# Patient Record
Sex: Female | Born: 1945 | Race: Black or African American | Hispanic: No | Marital: Single | Smoking: Current some day smoker
Health system: Southern US, Community
[De-identification: ages and names within clinical notes are randomized; demographics above are authoritative.]

## PROBLEM LIST (undated history)

## (undated) DIAGNOSIS — I2510 Atherosclerotic heart disease of native coronary artery without angina pectoris: Secondary | ICD-10-CM

## (undated) DIAGNOSIS — I10 Essential (primary) hypertension: Secondary | ICD-10-CM

## (undated) DIAGNOSIS — M2392 Unspecified internal derangement of left knee: Secondary | ICD-10-CM

## (undated) DIAGNOSIS — M1990 Unspecified osteoarthritis, unspecified site: Secondary | ICD-10-CM

## (undated) DIAGNOSIS — E785 Hyperlipidemia, unspecified: Secondary | ICD-10-CM

## (undated) DIAGNOSIS — J4489 Other specified chronic obstructive pulmonary disease: Secondary | ICD-10-CM

## (undated) DIAGNOSIS — I359 Nonrheumatic aortic valve disorder, unspecified: Secondary | ICD-10-CM

## (undated) DIAGNOSIS — E559 Vitamin D deficiency, unspecified: Secondary | ICD-10-CM

## (undated) DIAGNOSIS — M23309 Other meniscus derangements, unspecified meniscus, unspecified knee: Secondary | ICD-10-CM

## (undated) DIAGNOSIS — J449 Chronic obstructive pulmonary disease, unspecified: Secondary | ICD-10-CM

## (undated) DIAGNOSIS — K219 Gastro-esophageal reflux disease without esophagitis: Secondary | ICD-10-CM

## (undated) DIAGNOSIS — E669 Obesity, unspecified: Secondary | ICD-10-CM

## (undated) DIAGNOSIS — I639 Cerebral infarction, unspecified: Secondary | ICD-10-CM

## (undated) HISTORY — DX: Hyperlipidemia, unspecified: E78.5

## (undated) HISTORY — DX: Other specified chronic obstructive pulmonary disease: J44.89

## (undated) HISTORY — PX: SHOULDER SURGERY: SHX246

## (undated) HISTORY — PX: CORONARY ANGIOPLASTY WITH STENT PLACEMENT: SHX49

## (undated) HISTORY — PX: CHOLECYSTECTOMY: SHX55

## (undated) HISTORY — DX: Other meniscus derangements, unspecified meniscus, unspecified knee: M23.309

## (undated) HISTORY — DX: Obesity, unspecified: E66.9

## (undated) HISTORY — DX: Nonrheumatic aortic valve disorder, unspecified: I35.9

## (undated) HISTORY — DX: Vitamin D deficiency, unspecified: E55.9

## (undated) HISTORY — DX: Unspecified internal derangement of left knee: M23.92

## (undated) HISTORY — PX: KNEE SURGERY: SHX244

## (undated) HISTORY — DX: Atherosclerotic heart disease of native coronary artery without angina pectoris: I25.10

## (undated) HISTORY — DX: Cerebral infarction, unspecified: I63.9

## (undated) HISTORY — PX: ABDOMINAL HYSTERECTOMY: SHX81

## (undated) HISTORY — DX: Chronic obstructive pulmonary disease, unspecified: J44.9

## (undated) HISTORY — DX: Unspecified osteoarthritis, unspecified site: M19.90

## (undated) HISTORY — DX: Essential (primary) hypertension: I10

---

## 1994-03-12 DIAGNOSIS — E1110 Type 2 diabetes mellitus with ketoacidosis without coma: Secondary | ICD-10-CM | POA: Insufficient documentation

## 1997-11-16 ENCOUNTER — Ambulatory Visit: Admission: RE | Admit: 1997-11-16 | Discharge: 1997-11-16 | Payer: Self-pay

## 1997-12-08 ENCOUNTER — Encounter: Admission: RE | Admit: 1997-12-08 | Discharge: 1997-12-08 | Payer: Self-pay | Admitting: Internal Medicine

## 1998-02-01 ENCOUNTER — Ambulatory Visit (HOSPITAL_COMMUNITY): Admission: RE | Admit: 1998-02-01 | Discharge: 1998-02-01 | Payer: Self-pay | Admitting: Urology

## 1998-02-01 ENCOUNTER — Encounter: Payer: Self-pay | Admitting: Urology

## 1998-08-15 ENCOUNTER — Encounter: Payer: Self-pay | Admitting: Cardiology

## 1998-08-15 ENCOUNTER — Ambulatory Visit (HOSPITAL_COMMUNITY): Admission: RE | Admit: 1998-08-15 | Discharge: 1998-08-15 | Payer: Self-pay | Admitting: Cardiology

## 1998-09-11 ENCOUNTER — Encounter: Admission: RE | Admit: 1998-09-11 | Discharge: 1998-09-11 | Payer: Self-pay | Admitting: Internal Medicine

## 1998-12-27 ENCOUNTER — Encounter: Admission: RE | Admit: 1998-12-27 | Discharge: 1998-12-27 | Payer: Self-pay | Admitting: Internal Medicine

## 1999-05-08 ENCOUNTER — Emergency Department (HOSPITAL_COMMUNITY): Admission: EM | Admit: 1999-05-08 | Discharge: 1999-05-08 | Payer: Self-pay | Admitting: Internal Medicine

## 1999-07-15 ENCOUNTER — Emergency Department (HOSPITAL_COMMUNITY): Admission: EM | Admit: 1999-07-15 | Discharge: 1999-07-15 | Payer: Self-pay | Admitting: Emergency Medicine

## 1999-07-30 ENCOUNTER — Emergency Department (HOSPITAL_COMMUNITY): Admission: EM | Admit: 1999-07-30 | Discharge: 1999-07-30 | Payer: Self-pay | Admitting: Emergency Medicine

## 1999-08-07 ENCOUNTER — Encounter: Payer: Self-pay | Admitting: Internal Medicine

## 1999-08-07 ENCOUNTER — Ambulatory Visit (HOSPITAL_COMMUNITY): Admission: RE | Admit: 1999-08-07 | Discharge: 1999-08-07 | Payer: Self-pay | Admitting: Internal Medicine

## 1999-08-07 ENCOUNTER — Encounter: Admission: RE | Admit: 1999-08-07 | Discharge: 1999-08-07 | Payer: Self-pay | Admitting: Internal Medicine

## 2001-03-28 ENCOUNTER — Emergency Department (HOSPITAL_COMMUNITY): Admission: EM | Admit: 2001-03-28 | Discharge: 2001-03-29 | Payer: Self-pay | Admitting: Emergency Medicine

## 2001-07-02 ENCOUNTER — Encounter: Admission: RE | Admit: 2001-07-02 | Discharge: 2001-07-02 | Payer: Self-pay

## 2001-08-02 ENCOUNTER — Ambulatory Visit (HOSPITAL_COMMUNITY): Admission: RE | Admit: 2001-08-02 | Discharge: 2001-08-02 | Payer: Self-pay | Admitting: Internal Medicine

## 2001-08-02 ENCOUNTER — Encounter: Admission: RE | Admit: 2001-08-02 | Discharge: 2001-08-02 | Payer: Self-pay | Admitting: Internal Medicine

## 2001-08-11 ENCOUNTER — Ambulatory Visit (HOSPITAL_COMMUNITY): Admission: RE | Admit: 2001-08-11 | Discharge: 2001-08-11 | Payer: Self-pay | Admitting: Internal Medicine

## 2001-08-18 ENCOUNTER — Encounter: Admission: RE | Admit: 2001-08-18 | Discharge: 2001-08-18 | Payer: Self-pay

## 2001-09-14 ENCOUNTER — Ambulatory Visit (HOSPITAL_COMMUNITY): Admission: RE | Admit: 2001-09-14 | Discharge: 2001-09-14 | Payer: Self-pay | Admitting: Internal Medicine

## 2001-09-14 ENCOUNTER — Encounter: Payer: Self-pay | Admitting: Internal Medicine

## 2001-10-06 ENCOUNTER — Emergency Department (HOSPITAL_COMMUNITY): Admission: EM | Admit: 2001-10-06 | Discharge: 2001-10-06 | Payer: Self-pay | Admitting: Emergency Medicine

## 2001-10-06 ENCOUNTER — Encounter: Payer: Self-pay | Admitting: Emergency Medicine

## 2001-11-29 ENCOUNTER — Encounter: Payer: Self-pay | Admitting: Emergency Medicine

## 2001-11-30 ENCOUNTER — Encounter: Payer: Self-pay | Admitting: Orthopaedic Surgery

## 2001-11-30 ENCOUNTER — Inpatient Hospital Stay (HOSPITAL_COMMUNITY): Admission: EM | Admit: 2001-11-30 | Discharge: 2001-12-03 | Payer: Self-pay | Admitting: Emergency Medicine

## 2001-12-10 ENCOUNTER — Encounter: Admission: RE | Admit: 2001-12-10 | Discharge: 2001-12-10 | Payer: Self-pay | Admitting: Internal Medicine

## 2002-01-27 ENCOUNTER — Encounter: Admission: RE | Admit: 2002-01-27 | Discharge: 2002-02-17 | Payer: Self-pay | Admitting: Orthopaedic Surgery

## 2003-01-10 ENCOUNTER — Encounter: Admission: RE | Admit: 2003-01-10 | Discharge: 2003-01-10 | Payer: Self-pay | Admitting: Internal Medicine

## 2003-06-05 ENCOUNTER — Encounter: Admission: RE | Admit: 2003-06-05 | Discharge: 2003-06-05 | Payer: Self-pay | Admitting: Internal Medicine

## 2003-06-06 ENCOUNTER — Encounter: Admission: RE | Admit: 2003-06-06 | Discharge: 2003-06-06 | Payer: Self-pay | Admitting: Internal Medicine

## 2003-06-13 ENCOUNTER — Encounter: Admission: RE | Admit: 2003-06-13 | Discharge: 2003-06-13 | Payer: Self-pay | Admitting: Internal Medicine

## 2003-06-27 ENCOUNTER — Emergency Department (HOSPITAL_COMMUNITY): Admission: EM | Admit: 2003-06-27 | Discharge: 2003-06-27 | Payer: Self-pay

## 2003-07-01 ENCOUNTER — Emergency Department (HOSPITAL_COMMUNITY): Admission: AD | Admit: 2003-07-01 | Discharge: 2003-07-01 | Payer: Self-pay | Admitting: Family Medicine

## 2003-07-03 ENCOUNTER — Encounter: Admission: RE | Admit: 2003-07-03 | Discharge: 2003-07-03 | Payer: Self-pay | Admitting: Internal Medicine

## 2003-07-10 ENCOUNTER — Encounter: Admission: RE | Admit: 2003-07-10 | Discharge: 2003-07-10 | Payer: Self-pay | Admitting: Internal Medicine

## 2003-07-13 ENCOUNTER — Ambulatory Visit (HOSPITAL_COMMUNITY): Admission: RE | Admit: 2003-07-13 | Discharge: 2003-07-13 | Payer: Self-pay | Admitting: Thoracic Diseases

## 2003-09-23 ENCOUNTER — Emergency Department (HOSPITAL_COMMUNITY): Admission: EM | Admit: 2003-09-23 | Discharge: 2003-09-23 | Payer: Self-pay | Admitting: Emergency Medicine

## 2003-09-24 ENCOUNTER — Ambulatory Visit: Admission: RE | Admit: 2003-09-24 | Discharge: 2003-09-24 | Payer: Self-pay | Admitting: Emergency Medicine

## 2003-09-28 ENCOUNTER — Encounter: Admission: RE | Admit: 2003-09-28 | Discharge: 2003-09-28 | Payer: Self-pay | Admitting: Internal Medicine

## 2003-10-05 ENCOUNTER — Encounter: Admission: RE | Admit: 2003-10-05 | Discharge: 2003-10-05 | Payer: Self-pay | Admitting: Internal Medicine

## 2003-11-27 ENCOUNTER — Encounter: Admission: RE | Admit: 2003-11-27 | Discharge: 2003-11-27 | Payer: Self-pay | Admitting: Internal Medicine

## 2003-11-30 ENCOUNTER — Emergency Department (HOSPITAL_COMMUNITY): Admission: EM | Admit: 2003-11-30 | Discharge: 2003-12-01 | Payer: Self-pay | Admitting: Emergency Medicine

## 2003-12-25 ENCOUNTER — Encounter: Admission: RE | Admit: 2003-12-25 | Discharge: 2003-12-25 | Payer: Self-pay | Admitting: Internal Medicine

## 2004-01-10 ENCOUNTER — Emergency Department (HOSPITAL_COMMUNITY): Admission: EM | Admit: 2004-01-10 | Discharge: 2004-01-10 | Payer: Self-pay | Admitting: Emergency Medicine

## 2004-05-02 ENCOUNTER — Ambulatory Visit: Payer: Self-pay | Admitting: Internal Medicine

## 2004-05-06 ENCOUNTER — Ambulatory Visit: Payer: Self-pay | Admitting: Internal Medicine

## 2004-06-21 ENCOUNTER — Emergency Department (HOSPITAL_COMMUNITY): Admission: EM | Admit: 2004-06-21 | Discharge: 2004-06-21 | Payer: Self-pay | Admitting: Emergency Medicine

## 2004-07-10 ENCOUNTER — Ambulatory Visit: Payer: Self-pay | Admitting: Internal Medicine

## 2004-07-16 ENCOUNTER — Encounter: Payer: Self-pay | Admitting: Internal Medicine

## 2004-07-16 ENCOUNTER — Ambulatory Visit (HOSPITAL_COMMUNITY): Admission: RE | Admit: 2004-07-16 | Discharge: 2004-07-16 | Payer: Self-pay | Admitting: Internal Medicine

## 2004-07-16 ENCOUNTER — Encounter (INDEPENDENT_AMBULATORY_CARE_PROVIDER_SITE_OTHER): Payer: Self-pay | Admitting: *Deleted

## 2004-08-15 ENCOUNTER — Encounter: Admission: RE | Admit: 2004-08-15 | Discharge: 2004-11-13 | Payer: Self-pay | Admitting: Occupational Medicine

## 2004-12-12 ENCOUNTER — Emergency Department (HOSPITAL_COMMUNITY): Admission: EM | Admit: 2004-12-12 | Discharge: 2004-12-12 | Payer: Self-pay | Admitting: *Deleted

## 2005-04-04 ENCOUNTER — Emergency Department (HOSPITAL_COMMUNITY): Admission: EM | Admit: 2005-04-04 | Discharge: 2005-04-04 | Payer: Self-pay | Admitting: Family Medicine

## 2005-04-19 ENCOUNTER — Ambulatory Visit: Payer: Self-pay | Admitting: Internal Medicine

## 2005-04-19 ENCOUNTER — Observation Stay (HOSPITAL_COMMUNITY): Admission: EM | Admit: 2005-04-19 | Discharge: 2005-04-20 | Payer: Self-pay | Admitting: Family Medicine

## 2005-04-24 ENCOUNTER — Ambulatory Visit: Payer: Self-pay | Admitting: Pulmonary Disease

## 2005-04-25 ENCOUNTER — Ambulatory Visit (HOSPITAL_COMMUNITY): Admission: RE | Admit: 2005-04-25 | Discharge: 2005-04-26 | Payer: Self-pay | Admitting: Cardiology

## 2005-04-29 ENCOUNTER — Ambulatory Visit (HOSPITAL_COMMUNITY): Admission: RE | Admit: 2005-04-29 | Discharge: 2005-04-30 | Payer: Self-pay | Admitting: Cardiology

## 2005-04-29 ENCOUNTER — Ambulatory Visit: Payer: Self-pay | Admitting: Cardiology

## 2005-05-13 ENCOUNTER — Ambulatory Visit: Payer: Self-pay | Admitting: *Deleted

## 2005-05-21 ENCOUNTER — Ambulatory Visit: Payer: Self-pay | Admitting: Internal Medicine

## 2005-07-01 ENCOUNTER — Ambulatory Visit: Payer: Self-pay | Admitting: *Deleted

## 2005-07-11 ENCOUNTER — Ambulatory Visit: Payer: Self-pay | Admitting: Internal Medicine

## 2005-09-10 ENCOUNTER — Emergency Department (HOSPITAL_COMMUNITY): Admission: EM | Admit: 2005-09-10 | Discharge: 2005-09-10 | Payer: Self-pay | Admitting: Emergency Medicine

## 2005-09-18 ENCOUNTER — Ambulatory Visit: Payer: Self-pay | Admitting: Internal Medicine

## 2005-10-07 ENCOUNTER — Ambulatory Visit: Payer: Self-pay | Admitting: Internal Medicine

## 2005-10-14 ENCOUNTER — Ambulatory Visit: Payer: Self-pay | Admitting: Internal Medicine

## 2005-12-09 ENCOUNTER — Ambulatory Visit: Payer: Self-pay | Admitting: Internal Medicine

## 2005-12-23 ENCOUNTER — Ambulatory Visit: Payer: Self-pay | Admitting: Hospitalist

## 2006-01-01 ENCOUNTER — Ambulatory Visit: Payer: Self-pay | Admitting: *Deleted

## 2006-01-14 ENCOUNTER — Ambulatory Visit: Payer: Self-pay | Admitting: *Deleted

## 2006-01-14 ENCOUNTER — Observation Stay (HOSPITAL_COMMUNITY): Admission: EM | Admit: 2006-01-14 | Discharge: 2006-01-15 | Payer: Self-pay | Admitting: Emergency Medicine

## 2006-01-19 ENCOUNTER — Ambulatory Visit: Payer: Self-pay

## 2006-03-12 DIAGNOSIS — I2510 Atherosclerotic heart disease of native coronary artery without angina pectoris: Secondary | ICD-10-CM | POA: Insufficient documentation

## 2006-03-12 DIAGNOSIS — I10 Essential (primary) hypertension: Secondary | ICD-10-CM

## 2006-03-12 DIAGNOSIS — E1149 Type 2 diabetes mellitus with other diabetic neurological complication: Secondary | ICD-10-CM

## 2006-03-12 DIAGNOSIS — E1169 Type 2 diabetes mellitus with other specified complication: Secondary | ICD-10-CM

## 2006-03-12 DIAGNOSIS — K219 Gastro-esophageal reflux disease without esophagitis: Secondary | ICD-10-CM | POA: Insufficient documentation

## 2006-03-12 DIAGNOSIS — E785 Hyperlipidemia, unspecified: Secondary | ICD-10-CM

## 2006-05-21 ENCOUNTER — Ambulatory Visit: Payer: Self-pay | Admitting: Internal Medicine

## 2006-05-21 ENCOUNTER — Inpatient Hospital Stay (HOSPITAL_COMMUNITY): Admission: EM | Admit: 2006-05-21 | Discharge: 2006-05-23 | Payer: Self-pay | Admitting: Emergency Medicine

## 2006-06-09 ENCOUNTER — Ambulatory Visit: Payer: Self-pay | Admitting: *Deleted

## 2006-06-15 ENCOUNTER — Ambulatory Visit: Payer: Self-pay | Admitting: Internal Medicine

## 2006-06-15 DIAGNOSIS — D649 Anemia, unspecified: Secondary | ICD-10-CM

## 2006-06-15 DIAGNOSIS — G609 Hereditary and idiopathic neuropathy, unspecified: Secondary | ICD-10-CM | POA: Insufficient documentation

## 2006-06-15 LAB — CONVERTED CEMR LAB: Glucose, Bld: 153 mg/dL

## 2006-06-25 ENCOUNTER — Ambulatory Visit: Payer: Self-pay | Admitting: *Deleted

## 2006-07-03 ENCOUNTER — Ambulatory Visit: Payer: Self-pay | Admitting: *Deleted

## 2006-07-03 LAB — CONVERTED CEMR LAB
AST: 32 units/L (ref 0–37)
Bilirubin, Direct: 0.1 mg/dL (ref 0.0–0.3)
HDL: 47.7 mg/dL (ref >39.0)
Triglycerides: 55 mg/dL (ref 0–149)
VLDL: 11 mg/dL (ref 0–40)

## 2006-09-07 ENCOUNTER — Ambulatory Visit: Payer: Self-pay | Admitting: Internal Medicine

## 2006-10-29 ENCOUNTER — Ambulatory Visit: Payer: Self-pay | Admitting: Internal Medicine

## 2006-10-29 ENCOUNTER — Encounter (INDEPENDENT_AMBULATORY_CARE_PROVIDER_SITE_OTHER): Payer: Self-pay | Admitting: Internal Medicine

## 2006-10-29 DIAGNOSIS — M67919 Unspecified disorder of synovium and tendon, unspecified shoulder: Secondary | ICD-10-CM | POA: Insufficient documentation

## 2006-10-29 DIAGNOSIS — M719 Bursopathy, unspecified: Secondary | ICD-10-CM

## 2006-10-29 LAB — CONVERTED CEMR LAB: Blood Glucose, Fingerstick: 84

## 2006-11-05 ENCOUNTER — Emergency Department (HOSPITAL_COMMUNITY): Admission: EM | Admit: 2006-11-05 | Discharge: 2006-11-05 | Payer: Self-pay | Admitting: Emergency Medicine

## 2006-11-26 ENCOUNTER — Ambulatory Visit (HOSPITAL_COMMUNITY): Admission: RE | Admit: 2006-11-26 | Discharge: 2006-11-26 | Payer: Self-pay | Admitting: Internal Medicine

## 2006-11-26 ENCOUNTER — Ambulatory Visit: Payer: Self-pay | Admitting: *Deleted

## 2006-11-27 ENCOUNTER — Telehealth: Payer: Self-pay | Admitting: *Deleted

## 2006-12-11 ENCOUNTER — Encounter: Admission: RE | Admit: 2006-12-11 | Discharge: 2007-03-11 | Payer: Self-pay | Admitting: Orthopedic Surgery

## 2006-12-25 ENCOUNTER — Ambulatory Visit: Payer: Self-pay | Admitting: Internal Medicine

## 2006-12-29 ENCOUNTER — Telehealth: Payer: Self-pay | Admitting: *Deleted

## 2007-01-06 ENCOUNTER — Encounter (INDEPENDENT_AMBULATORY_CARE_PROVIDER_SITE_OTHER): Payer: Self-pay | Admitting: *Deleted

## 2007-01-29 ENCOUNTER — Telehealth (INDEPENDENT_AMBULATORY_CARE_PROVIDER_SITE_OTHER): Payer: Self-pay | Admitting: *Deleted

## 2007-03-09 ENCOUNTER — Telehealth (INDEPENDENT_AMBULATORY_CARE_PROVIDER_SITE_OTHER): Payer: Self-pay | Admitting: *Deleted

## 2007-03-29 ENCOUNTER — Telehealth: Payer: Self-pay | Admitting: *Deleted

## 2007-06-03 ENCOUNTER — Telehealth: Payer: Self-pay | Admitting: *Deleted

## 2007-06-04 ENCOUNTER — Telehealth: Payer: Self-pay | Admitting: *Deleted

## 2007-06-09 ENCOUNTER — Ambulatory Visit: Payer: Self-pay | Admitting: Cardiology

## 2007-06-10 ENCOUNTER — Telehealth (INDEPENDENT_AMBULATORY_CARE_PROVIDER_SITE_OTHER): Payer: Self-pay | Admitting: *Deleted

## 2007-06-15 ENCOUNTER — Ambulatory Visit: Payer: Self-pay

## 2007-06-25 ENCOUNTER — Telehealth: Payer: Self-pay | Admitting: *Deleted

## 2007-06-28 ENCOUNTER — Ambulatory Visit (HOSPITAL_COMMUNITY): Admission: RE | Admit: 2007-06-28 | Discharge: 2007-06-28 | Payer: Self-pay | Admitting: Orthopedic Surgery

## 2007-06-30 ENCOUNTER — Encounter: Admission: RE | Admit: 2007-06-30 | Discharge: 2007-09-07 | Payer: Self-pay | Admitting: Orthopedic Surgery

## 2007-07-13 ENCOUNTER — Telehealth (INDEPENDENT_AMBULATORY_CARE_PROVIDER_SITE_OTHER): Payer: Self-pay | Admitting: *Deleted

## 2007-08-09 ENCOUNTER — Telehealth: Payer: Self-pay | Admitting: *Deleted

## 2007-09-27 ENCOUNTER — Telehealth (INDEPENDENT_AMBULATORY_CARE_PROVIDER_SITE_OTHER): Payer: Self-pay | Admitting: *Deleted

## 2007-10-18 ENCOUNTER — Emergency Department (HOSPITAL_COMMUNITY): Admission: EM | Admit: 2007-10-18 | Discharge: 2007-10-18 | Payer: Self-pay | Admitting: Family Medicine

## 2007-12-04 ENCOUNTER — Emergency Department (HOSPITAL_COMMUNITY): Admission: EM | Admit: 2007-12-04 | Discharge: 2007-12-04 | Payer: Self-pay | Admitting: Emergency Medicine

## 2007-12-29 ENCOUNTER — Telehealth (INDEPENDENT_AMBULATORY_CARE_PROVIDER_SITE_OTHER): Payer: Self-pay | Admitting: Internal Medicine

## 2008-01-06 ENCOUNTER — Telehealth (INDEPENDENT_AMBULATORY_CARE_PROVIDER_SITE_OTHER): Payer: Self-pay | Admitting: Internal Medicine

## 2008-01-14 ENCOUNTER — Encounter (INDEPENDENT_AMBULATORY_CARE_PROVIDER_SITE_OTHER): Payer: Self-pay | Admitting: *Deleted

## 2008-01-14 ENCOUNTER — Ambulatory Visit: Payer: Self-pay | Admitting: Internal Medicine

## 2008-01-14 LAB — CONVERTED CEMR LAB
BUN: 12 mg/dL (ref 6–23)
CO2: 23 meq/L (ref 19–32)
Calcium: 9.6 mg/dL (ref 8.4–10.5)
Chloride: 111 meq/L (ref 96–112)
Cholesterol: 140 mg/dL (ref 0–200)
Creatinine, Ser: 0.82 mg/dL (ref 0.40–1.20)
HDL: 52 mg/dL (ref >39)
Hgb A1c MFr Bld: 7.1 %
Total CHOL/HDL Ratio: 2.7
Triglycerides: 136 mg/dL (ref <150)

## 2008-03-05 ENCOUNTER — Emergency Department (HOSPITAL_COMMUNITY): Admission: EM | Admit: 2008-03-05 | Discharge: 2008-03-05 | Payer: Self-pay | Admitting: Emergency Medicine

## 2008-09-01 ENCOUNTER — Encounter (INDEPENDENT_AMBULATORY_CARE_PROVIDER_SITE_OTHER): Payer: Self-pay | Admitting: Internal Medicine

## 2008-09-01 ENCOUNTER — Ambulatory Visit: Payer: Self-pay | Admitting: Infectious Disease

## 2008-09-01 LAB — CONVERTED CEMR LAB
BUN: 14 mg/dL (ref 6–23)
CO2: 27 meq/L (ref 19–32)
Calcium: 9.2 mg/dL (ref 8.4–10.5)
Chloride: 110 meq/L (ref 96–112)
Creatinine, Ser: 0.87 mg/dL (ref 0.40–1.20)
GFR calc Af Amer: >60 mL/min (ref >60)
Pro B Natriuretic peptide (BNP): 60 pg/mL (ref 0.0–100.0)

## 2008-09-15 ENCOUNTER — Ambulatory Visit: Payer: Self-pay | Admitting: Internal Medicine

## 2008-09-15 ENCOUNTER — Encounter (INDEPENDENT_AMBULATORY_CARE_PROVIDER_SITE_OTHER): Payer: Self-pay | Admitting: Internal Medicine

## 2008-09-15 LAB — CONVERTED CEMR LAB
Calcium: 9.8 mg/dL (ref 8.4–10.5)
Cholesterol: 197 mg/dL (ref 0–200)
Creatinine, Ser: 0.95 mg/dL (ref 0.40–1.20)
GFR calc Af Amer: >60 mL/min (ref >60)
Glucose, Bld: 194 mg/dL — ABNORMAL HIGH (ref 70–99)
LDL Cholesterol: 99 mg/dL (ref 0–99)
Sodium: 140 meq/L (ref 135–145)
Triglycerides: 257 mg/dL — ABNORMAL HIGH (ref <150)

## 2008-11-06 ENCOUNTER — Emergency Department (HOSPITAL_COMMUNITY): Admission: EM | Admit: 2008-11-06 | Discharge: 2008-11-06 | Payer: Self-pay | Admitting: Emergency Medicine

## 2008-11-20 ENCOUNTER — Encounter: Payer: Self-pay | Admitting: Internal Medicine

## 2008-12-19 ENCOUNTER — Telehealth: Payer: Self-pay | Admitting: Internal Medicine

## 2009-01-08 ENCOUNTER — Ambulatory Visit: Payer: Self-pay | Admitting: Infectious Diseases

## 2009-01-08 DIAGNOSIS — J069 Acute upper respiratory infection, unspecified: Secondary | ICD-10-CM | POA: Insufficient documentation

## 2009-01-08 LAB — CONVERTED CEMR LAB
Cholesterol, target level: 200 mg/dL
HDL goal, serum: 40 mg/dL
LDL Goal: 70 mg/dL

## 2009-01-09 ENCOUNTER — Encounter: Payer: Self-pay | Admitting: Internal Medicine

## 2009-01-09 LAB — CONVERTED CEMR LAB
CO2: 20 meq/L (ref 19–32)
Calcium: 9.5 mg/dL (ref 8.4–10.5)
Creatinine, Ser: 0.85 mg/dL (ref 0.40–1.20)
MCV: 91.2 fL (ref >=78.0)
Platelets: 223 10*3/uL (ref 150–400)
RDW: 13.2 % (ref 11.5–15.5)
Sodium: 143 meq/L (ref 135–145)
WBC: 11.6 10*3/uL — ABNORMAL HIGH (ref 4.0–10.5)

## 2009-01-15 ENCOUNTER — Emergency Department (HOSPITAL_COMMUNITY): Admission: EM | Admit: 2009-01-15 | Discharge: 2009-01-15 | Payer: Self-pay | Admitting: Family Medicine

## 2009-01-16 ENCOUNTER — Telehealth: Payer: Self-pay | Admitting: *Deleted

## 2009-01-19 ENCOUNTER — Emergency Department (HOSPITAL_COMMUNITY): Admission: EM | Admit: 2009-01-19 | Discharge: 2009-01-19 | Payer: Self-pay | Admitting: Emergency Medicine

## 2009-01-22 ENCOUNTER — Ambulatory Visit: Payer: Self-pay | Admitting: Diagnostic Radiology

## 2009-01-22 ENCOUNTER — Ambulatory Visit (HOSPITAL_BASED_OUTPATIENT_CLINIC_OR_DEPARTMENT_OTHER): Admission: RE | Admit: 2009-01-22 | Discharge: 2009-01-22 | Payer: Self-pay | Admitting: Emergency Medicine

## 2009-01-22 ENCOUNTER — Encounter: Payer: Self-pay | Admitting: Internal Medicine

## 2009-01-23 ENCOUNTER — Ambulatory Visit: Payer: Self-pay | Admitting: Internal Medicine

## 2009-01-23 DIAGNOSIS — T887XXA Unspecified adverse effect of drug or medicament, initial encounter: Secondary | ICD-10-CM

## 2009-01-23 DIAGNOSIS — M549 Dorsalgia, unspecified: Secondary | ICD-10-CM | POA: Insufficient documentation

## 2009-01-23 LAB — CONVERTED CEMR LAB: Blood Glucose, Fingerstick: 143

## 2009-01-24 LAB — CONVERTED CEMR LAB
ALT: 9 units/L (ref 0–35)
Albumin: 4.2 g/dL (ref 3.5–5.2)
Creatinine, Urine: 160.5 mg/dL
Indirect Bilirubin: 0.2 mg/dL (ref 0.0–0.9)
Microalb Creat Ratio: 100 mg/g — ABNORMAL HIGH (ref 0.0–30.0)
Microalb, Ur: 16.05 mg/dL — ABNORMAL HIGH (ref 0.00–1.89)
Total CK: 197 units/L — ABNORMAL HIGH (ref 7–177)
Total Protein: 6.7 g/dL (ref 6.0–8.3)

## 2009-01-25 ENCOUNTER — Encounter: Payer: Self-pay | Admitting: Internal Medicine

## 2009-02-13 ENCOUNTER — Encounter: Payer: Self-pay | Admitting: Internal Medicine

## 2009-02-20 ENCOUNTER — Ambulatory Visit: Payer: Self-pay | Admitting: Internal Medicine

## 2009-02-20 DIAGNOSIS — I739 Peripheral vascular disease, unspecified: Secondary | ICD-10-CM

## 2009-02-21 ENCOUNTER — Telehealth (INDEPENDENT_AMBULATORY_CARE_PROVIDER_SITE_OTHER): Payer: Self-pay | Admitting: *Deleted

## 2009-03-07 ENCOUNTER — Telehealth (INDEPENDENT_AMBULATORY_CARE_PROVIDER_SITE_OTHER): Payer: Self-pay | Admitting: *Deleted

## 2009-03-08 ENCOUNTER — Encounter: Payer: Self-pay | Admitting: Internal Medicine

## 2009-03-08 ENCOUNTER — Ambulatory Visit: Payer: Self-pay | Admitting: Cardiovascular Disease

## 2009-03-08 ENCOUNTER — Encounter (HOSPITAL_COMMUNITY): Admission: RE | Admit: 2009-03-08 | Discharge: 2009-04-27 | Payer: Self-pay | Admitting: Internal Medicine

## 2009-03-08 ENCOUNTER — Ambulatory Visit: Payer: Self-pay

## 2009-03-23 ENCOUNTER — Telehealth: Payer: Self-pay | Admitting: Internal Medicine

## 2009-04-10 ENCOUNTER — Telehealth: Payer: Self-pay | Admitting: Internal Medicine

## 2009-05-22 ENCOUNTER — Telehealth: Payer: Self-pay | Admitting: *Deleted

## 2009-05-24 ENCOUNTER — Ambulatory Visit: Payer: Self-pay | Admitting: Internal Medicine

## 2009-05-24 LAB — CONVERTED CEMR LAB
ALT: 12 units/L (ref 0–35)
AST: 11 units/L (ref 0–37)
BUN: 20 mg/dL (ref 6–23)
Blood Glucose, Fingerstick: 187
Creatinine, Ser: 1.01 mg/dL (ref 0.40–1.20)
Hgb A1c MFr Bld: 7.2 %
Total Bilirubin: 0.2 mg/dL — ABNORMAL LOW (ref 0.3–1.2)

## 2009-05-28 ENCOUNTER — Telehealth: Payer: Self-pay | Admitting: Internal Medicine

## 2009-06-25 ENCOUNTER — Emergency Department (HOSPITAL_COMMUNITY): Admission: EM | Admit: 2009-06-25 | Discharge: 2009-06-25 | Payer: Self-pay | Admitting: Family Medicine

## 2009-06-29 ENCOUNTER — Encounter: Payer: Self-pay | Admitting: Internal Medicine

## 2009-07-28 ENCOUNTER — Emergency Department (HOSPITAL_COMMUNITY): Admission: EM | Admit: 2009-07-28 | Discharge: 2009-07-28 | Payer: Self-pay | Admitting: Emergency Medicine

## 2009-07-31 ENCOUNTER — Ambulatory Visit: Payer: Self-pay | Admitting: Internal Medicine

## 2009-07-31 ENCOUNTER — Telehealth: Payer: Self-pay | Admitting: Internal Medicine

## 2009-07-31 DIAGNOSIS — F17200 Nicotine dependence, unspecified, uncomplicated: Secondary | ICD-10-CM | POA: Insufficient documentation

## 2009-08-09 ENCOUNTER — Telehealth: Payer: Self-pay | Admitting: Internal Medicine

## 2009-08-27 ENCOUNTER — Ambulatory Visit: Payer: Self-pay | Admitting: Internal Medicine

## 2009-08-27 LAB — CONVERTED CEMR LAB
Albumin: 4.1 g/dL (ref 3.5–5.2)
Alkaline Phosphatase: 81 units/L (ref 39–117)
BUN: 18 mg/dL (ref 6–23)
CO2: 25 meq/L (ref 19–32)
Glucose, Bld: 156 mg/dL — ABNORMAL HIGH (ref 70–99)
Hemoglobin: 11.8 g/dL — ABNORMAL LOW (ref 12.0–15.0)
MCHC: 32.4 g/dL (ref 30.0–36.0)
MCV: 91.2 fL (ref >=78.0)
Potassium: 4.3 meq/L (ref 3.5–5.3)
RBC: 3.99 M/uL (ref 3.87–5.11)
Sodium: 140 meq/L (ref 135–145)
Total Bilirubin: 0.3 mg/dL (ref 0.3–1.2)
Total Protein: 6.8 g/dL (ref 6.0–8.3)
WBC: 8.3 10*3/uL (ref 4.0–10.5)

## 2009-09-04 ENCOUNTER — Telehealth: Payer: Self-pay | Admitting: Internal Medicine

## 2009-09-26 ENCOUNTER — Ambulatory Visit: Payer: Self-pay | Admitting: Internal Medicine

## 2009-10-09 ENCOUNTER — Telehealth (INDEPENDENT_AMBULATORY_CARE_PROVIDER_SITE_OTHER): Payer: Self-pay | Admitting: *Deleted

## 2009-10-09 ENCOUNTER — Telehealth: Payer: Self-pay | Admitting: Internal Medicine

## 2009-11-07 ENCOUNTER — Telehealth: Payer: Self-pay | Admitting: Internal Medicine

## 2010-02-27 ENCOUNTER — Emergency Department (HOSPITAL_COMMUNITY)
Admission: EM | Admit: 2010-02-27 | Discharge: 2010-02-27 | Payer: Self-pay | Source: Home / Self Care | Admitting: Family Medicine

## 2010-03-19 ENCOUNTER — Ambulatory Visit (HOSPITAL_COMMUNITY)
Admission: RE | Admit: 2010-03-19 | Discharge: 2010-03-19 | Payer: Self-pay | Source: Home / Self Care | Admitting: Chiropractic Medicine

## 2010-05-19 ENCOUNTER — Encounter: Payer: Self-pay | Admitting: *Deleted

## 2010-05-30 NOTE — Progress Notes (Signed)
Summary: phone/gg/investigating affordable insulin options/dmr  Phone Note Call from Patient   Caller: Patient Summary of Call: Pt called and stated she is retiring this Friday and will not be able to use out pt pharmacy for meds.  She has been  on lantus and novolog. Do you have any suggestions of what she could change to and where she could get the best price on these meds? 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     Follow-up by: Barnabas Harries RD,CDE,  October 09, 2009 4:34 PM  Additional Follow-up for Phone Call Additional follow up Details #1::        called patient: she doubts she'll take cobra-and cash price of Lantus nad Novolog( she prefers pens) are high.,  novolog- not going thru much 2-3 units a meal, 10 pens now. lantus- 10 pens left. she already has an application in with [facility], is investigating other avenues. I told her I do not think she would get as good control or be as happy woth human insulin, but if needed she could try N and R or N as basal and Novolog pens for meals as needed. She will make doctor appointment as needed if unable to get current insulin through [facility]. Testing supplies may also become too expensive. She may need to change to a less expensive meter. Additional Follow-up by: Barnabas Harries RD,CDE,  October 09, 2009 4:45 PM

## 2010-05-30 NOTE — Letter (Signed)
Summary: [facility]:   [facility]:   Imported By: Bonner Puna 06/29/2009 16:11:26  _____________________________________________________________________  External Attachment:    Type:   Image     Comment:   External Document

## 2010-05-30 NOTE — Progress Notes (Signed)
Summary: refill/gg  Phone Note Refill Request  on Sep 04, 2009 4:07 PM  Refills Requested: Medication #1:  PRILOSEC 40 MG  CPDR Take 1 tablet by mouth once a day as needed ***Can we change to nexim 20 mg because there is no copay.   Method Requested: Electronic Initial call taken by: Gevena Cotton RN,  Sep 04, 2009 4:07 PM  Follow-up for Phone Call        Rx called to pharmacy Follow-up by: Jolene Provost MD,  Sep 05, 2009 11:09 AM    New/Updated Medications: NEXIUM 20 MG CPDR (ESOMEPRAZOLE MAGNESIUM) Take 1 tablet by mouth once a day Prescriptions: NEXIUM 20 MG CPDR (ESOMEPRAZOLE MAGNESIUM) Take 1 tablet by mouth once a day  #31 x 3   Entered and Authorized by:   Jolene Provost MD   Signed by:   Jolene Provost MD on 09/05/2009   Method used:   Electronically to        [facility]       [address]       [place], [place]  [address]       Ph: [phone]       Fax: [phone]   RxID:   [id number]

## 2010-05-30 NOTE — Progress Notes (Signed)
Summary: phone/gg  Phone Note Call from Patient   Caller: Patient Summary of Call: Pt called for appointment because she feels her medication is making her sick. ( pravastatin )  C/O nausea, headaches, eyes hurt when looks at  bright lights. Initial call taken by: Gevena Cotton RN,  May 22, 2009 10:57 AM  Follow-up for Phone Call        Pt seen on the 27th for this problem Follow-up by: Gevena Cotton RN,  May 29, 2009 9:17 AM

## 2010-05-30 NOTE — Progress Notes (Signed)
Summary: sided effect from medication   Phone Note Call from Patient Call back at Home Phone [phone] Call back at [phone]   Caller: Patient Reason for Call: Talk to Nurse Details for Reason: sided effect from  Pravastatin Sodium 20 Mg Tabs. pt stop taking.  Initial call taken by: Neil Crouch,  May 28, 2009 10:37 AM  Follow-up for Phone Call        Spoke with patient. Pt states Dr. Haroldine Laws order Privastatin 20 mg daily on her last office visit. She had side effects from it N&V headache neck stiffness. She stoped taken this  medication one or two weeks ago. Pt's PCP order Lovastatin 20 mg on 05/24/09. Pt. states she would like to get  a different medication , because she may not be able to take statins. Carollee Sires, RN, BSN  May 28, 2009 11:05 AM   Additional Follow-up for Phone Call Additional follow up Details #1::        lets try red yeat rice 1 tablet a day to start. Jolaine Artist, MD, [facility]  May 28, 2009 11:35 PM  pt is aware, will try Kevan Rosebush, RN  May 29, 2009 11:28 AM

## 2010-05-30 NOTE — Assessment & Plan Note (Signed)
Summary: MEDICATION ADJUSTMENT/gg   Vital Signs:  Patient profile:   65 year old female Height:      65.5 inches (166.37 cm) Weight:      164.7 pounds (74.86 kg) BMI:     27.09 Temp:     98.9 degrees F oral Pulse rate:   79 / minute BP sitting:   149 / 62  (right arm)  Vitals Entered By: Morrison Old RN (May 24, 2009 2:42 PM) CC: Having "problems" w/Pravastatin - nausea and h/a's and dizziness; she stopped taking the med. Is Patient Diabetic? Yes Did you bring your meter with you today? No Pain Assessment Patient in pain? no      Nutritional Status BMI of 25 - 29 = overweight CBG Result 187  Have you ever been in a relationship where you felt threatened, hurt or afraid?No   Does patient need assistance? Functional Status Self care Ambulation Normal   Primary Care Provider:  Jolene Provost MD  CC:  Having "problems" w/Pravastatin - nausea and h/a's and dizziness; she stopped taking the med.Marland Kitchen  History of Present Illness: 65 yo woman with PMH as listed below who presents after calling the clinic saying that she was having side effects from pravastatin.  The patient was on simvastatin that was started 12-2008 but started to have back pain that was felt to be myalgia. Simvastatin was stopped 12-2008. However, she since saw her cardiologist Dr. Haroldine Laws, who felt that her LDL goal for him was <70 and he wanted her to try another statin so he started her on pravastatin on 02-20-2009. At first she was scared to take the pravastatin but then started to take it the first part of December that her cardiologist put her on. She started to get nauseous, feels tired all the time, headaches, dizziness. No muscle These symptoms started after taking it for a few weeks. She kept taking the prvastatin up until the first part of this month. These symptoms have completely resolved.   The patient says that she would be willing to try another statin.  DMII: Did not bring meter today. Checks  blood sugar 3 times/day before meals. Usually less than 150. Never below 40. No symptoms of hypoglycemia. She is going to call to make her yearly eye appointment in February. No ulcers on feet, etc.   Preventive Screening-Counseling & Management  Alcohol-Tobacco     Alcohol drinks/day: 0     Smoking Status: current     Smoking Cessation Counseling: yes     Packs/Day: 1/2     Year Started: 1963  Caffeine-Diet-Exercise     Does Patient Exercise: yes     Type of exercise: WALK  Problems Prior to Update: 1)  Claudication, Intermittent  (ICD-443.9) 2)  Chest Tightness-pressure-other  [id number]) 3)  Claudication  (ICD-443.9) 4)  Adverse Drug Reaction  (ICD-995.20) 5)  Unspecified Backache  (ICD-724.5) 6)  Uri  (ICD-465.9) 7)  Hepatotoxicity, Drug-induced, Risk of  (ICD-V58.69) 8)  Rotator Cuff Syndrome, Right  (ICD-726.10) 9)  Coronary Artery Disease  (ICD-414.00) 10)  Family History Diabetes 1st Degree Relative  (ICD-V18.0) 11)  Peripheral Neuropathy  (ICD-356.9) 12)  Diabetes Mellitus, Type II  (ICD-250.00) 13)  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 Codeine 2)  Simvastatin 3)  * Pravastatin  Review of Systems  The patient  denies anorexia, fever, weight loss, weight gain, vision loss, decreased hearing, hoarseness, chest pain, syncope, dyspnea on exertion, peripheral edema, prolonged cough, headaches, hemoptysis, abdominal pain, melena, hematochezia, severe indigestion/heartburn, hematuria, incontinence, genital sores, muscle weakness, suspicious skin lesions, transient blindness, difficulty walking, depression, unusual weight change, abnormal bleeding, and enlarged lymph nodes.         Please see HPI. Please note that problems of headaches, dizziness, fatigue are not present today and have  resolved.   Physical Exam  General:  alert, well-developed, well-nourished, and well-hydrated.   Head:  normocephalic and atraumatic.   Eyes:  vision grossly intact, pupils equal, pupils round, and pupils reactive to light.   Ears:  no external deformities.   Nose:  no external deformity.   Lungs:  normal respiratory effort, no accessory muscle use, normal breath sounds, no crackles, and no wheezes.   Heart:  normal rate, regular rhythm, no murmur, and no gallop.   Abdomen:  soft, non-tender, and normal bowel sounds.   Neurologic:  alert & oriented X3, cranial nerves II-XII intact, and strength normal in all extremities.   Psych:  Oriented X3, memory intact for recent and remote, normally interactive, good eye contact, not anxious appearing, and not depressed appearing.     Impression & Recommendations:  Problem # 1:  ADVERSE DRUG REACTION (ICD-995.20) From my reading of UpToDate there is a risk of headache, dizziness, and fatigue with Pravastatin. There is no way for me to be sure that this is what is causing her symptoms, but she definitely associates these symptoms with pravastatin and is unwilling to take the medication. Given her Hx of CAD as well as DM, I think she needs to be on a statin if at all possible. I will try Lovastatin and see if she can tolerate this. I discussed this with Dr. Eppie Gibson.  Checking LFTs today.    Orders: T-Comprehensive Metabolic Panel ([id number])  Problem # 2:  DIABETES MELLITUS, TYPE II (ICD-250.00) Did not bring meter but per HPI glucose seems well-controlled. A1c is 7.2 today, which is a very slight increase. I have advised her to continue her current regimen, focus on good diet, and return for checkup in 3 months.  Of note, I changed metformin from the extended release form because she says she can get the non-extended release form for free.  She is going to call to make her yearly eye appointment in February.  The following medications were  removed from the medication list:    Glucophage Xr 500 Mg Xr24h-tab (Metformin hcl) .Marland Kitchen... Take 1 tablet by mouth once a day Her updated medication list for this problem includes:    Lantus Solostar 100 Unit/ml Soln (Insulin glargine) ..... Inject 25  units subcutaneously at bedtime    Humalog Pen 100 Unit/ml Soln (Insulin lispro (human)) ..... [place] as directed for sliding scale coverage.    Lisinopril 5 Mg Tabs (Lisinopril) .Marland Kitchen... Take 1 tablet by mouth once a day    Metformin Hcl 500 Mg Tabs (Metformin hcl) .Marland Kitchen... Please take one tab by mouth twice daily  Orders: T-Hgb A1C (in-house) [id number]) T- Capillary Blood Glucose [id number])  Problem # 3:  HYPERTENSION (ICD-401.9) BP is not at goal today. She had microalb/Cr ratio of 100 in 12/2008. She is not on ACE-I and tells me that she has never had an adverse reaction to an ACE-I in the past. I am starting very low-dose Lisinopril and having her return in 2 weeks to recheck BP and Bmet.  Of  note, I changed metoprolol to regular non-sustained release as she gets the regular form for free. She says she is OK taking it twice daily instead of once daily.   Also, as her ACE is titrated up in the future, if she has BP at goal she may even be able to stop HCTZ as she is on a relatively low dose.   The following medications were removed from the medication list:    Metoprolol Succinate 100 Mg Tb24 (Metoprolol succinate) .Marland Kitchen... Take 1 tablet by mouth once a day Her updated medication list for this problem includes:    Hydrochlorothiazide 12.5 Mg Tabs (Hydrochlorothiazide) .Marland Kitchen... Take 1 tablet by mouth once a day    Lisinopril 5 Mg Tabs (Lisinopril) .Marland Kitchen... Take 1 tablet by mouth once a day    Metoprolol Tartrate 100 Mg Tabs (Metoprolol tartrate) .Marland Kitchen... Please take 1 tab by mouth twice daily  Orders: T-Comprehensive Metabolic Panel ([id number])  Problem # 4:  HYPERLIPIDEMIA (ICD-272.4) See plan above. Started Lovastatin. Is not fasting today so did not perform  fasting lipid panel.  The following medications were removed from the medication list:    Pravastatin Sodium 20 Mg Tabs (Pravastatin sodium) .Marland Kitchen... Take one tablet by mouth daily at bedtime Her updated medication list for this problem includes:    Lovastatin 20 Mg Tabs (Lovastatin) .Marland Kitchen... Take 1 tablet by mouth once a day with dinner  Orders: T-Comprehensive Metabolic Panel ([id number])  Problem # 5:  Preventive Health Care (ICD-V70.0) Gave pneumovax today. Reports having tetanus last year. Says she already had flu this year. Says she had mammo at end of 2010 and that it was normal.   Complete Medication List: 1)  Lantus Solostar 100 Unit/ml Soln (Insulin glargine) .... Inject 25  units subcutaneously at bedtime 2)  Humalog Pen 100 Unit/ml Soln (Insulin lispro (human)) .... [place] as directed for sliding scale coverage. 3)  Plavix 75 Mg Tabs (Clopidogrel bisulfate) .... Take 1 tablet once daily for 1 year (1/08) 4)  Isosorbide Mononitrate Cr 30 Mg Tb24 (Isosorbide mononitrate) .... Take 1 tablet by mouth once a day 5)  Prilosec 40 Mg Cpdr (Omeprazole) .... Take 1 tablet by mouth once a day 6)  One Touch Ultra Test Strips  .... Test blood sugars 4 times a day 7)  Bd Ultra-fine Pen Needles 29g X 12.32mm Misc (Insulin pen needle) .... Use as directed 8)  Hydrochlorothiazide 12.5 Mg Tabs (Hydrochlorothiazide) .... Take 1 tablet by mouth once a day 9)  Onetouch Ultra Test Strp (Glucose blood) .... Use as directed. 10)  Nitrostat 0.4 Mg Subl (Nitroglycerin) .Marland Kitchen.. 1 tablet under tongue at onset of chest pain; you may repeat every 5 minutes for up to 3 doses. 11)  Lovastatin 20 Mg Tabs (Lovastatin) .... Take 1 tablet by mouth once a day with dinner 12)  Lisinopril 5 Mg Tabs (Lisinopril) .... Take 1 tablet by mouth once a day 13)  Metoprolol Tartrate 100 Mg Tabs (Metoprolol tartrate) .... Please take 1 tab by mouth twice daily 14)  Metformin Hcl 500 Mg Tabs (Metformin hcl) .... Please take one tab by  mouth twice daily  Other Orders: Pneumococcal Vaccine [id number]) Admin 1st Vaccine [id number])  Patient Instructions: 1)  Please return to clinic in 2 weeks to recheck your blood pressure and labs after starting the medication Lisinopril. Prescriptions: METFORMIN HCL 500 MG TABS (METFORMIN HCL) Please take one tab by mouth twice daily  #60 x 4   Entered and Authorized by:   Harriett Sine  MD   Signed by:   Harriett Sine MD on 05/24/2009   Method used:   Print then Give to Patient   RxID:   [id number] METOPROLOL TARTRATE 100 MG TABS (METOPROLOL TARTRATE) Please take 1 tab by mouth twice daily  #60 x 4   Entered and Authorized by:   Harriett Sine MD   Signed by:   Harriett Sine MD on 05/24/2009   Method used:   Print then Give to Patient   RxID:   [id number] LOVASTATIN 20 MG TABS (LOVASTATIN) Take 1 tablet by mouth once a day with dinner  #31 x 2   Entered and Authorized by:   Harriett Sine MD   Signed by:   Harriett Sine MD on 05/24/2009   Method used:   Print then Give to Patient   RxID:   [id number] LISINOPRIL 5 MG TABS (LISINOPRIL) Take 1 tablet by mouth once a day  #31 x 2   Entered and Authorized by:   Harriett Sine MD   Signed by:   Harriett Sine MD on 05/24/2009   Method used:   Print then Give to Patient   RxID:   [id number]   Prevention & Chronic Care Immunizations   Influenza vaccine: Not documented   Influenza vaccine deferral: Deferred  (01/23/2009)    Tetanus booster: Not documented   Td booster deferral: Deferred  (01/23/2009)    Pneumococcal vaccine: Pneumovax  (05/24/2009)    H. zoster vaccine: Not documented   H. zoster vaccine deferral: Deferred  (01/23/2009)  Colorectal Screening   Hemoccult: Not documented   Hemoccult action/deferral: Refused  (01/23/2009)    Colonoscopy: Not documented   Colonoscopy action/deferral: Refused  (05/24/2009)  Other Screening   Pap smear: Not documented   Pap smear  action/deferral: Not indicated S/P hysterectomy  (01/08/2009)    Mammogram: Not documented   Mammogram action/deferral: Ordered  (01/08/2009)    DXA bone density scan: Not documented   Smoking status: current  (05/24/2009)   Smoking cessation counseling: yes  (05/24/2009)  Diabetes Mellitus   HgbA1C: 7.2  (05/24/2009)    Eye exam: Not documented   Diabetic eye exam action/deferral: Ophthalmology referral  (01/23/2009)    Foot exam: yes  (01/08/2009)   Foot exam action/deferral: Do today   High risk foot: Not documented   Foot care education: Not documented    Urine microalbumin/creatinine ratio: 100.0  (01/23/2009)   Urine microalbumin action/deferral: Ordered    Diabetes flowsheet reviewed?: Yes   Progress toward A1C goal: Deteriorated  Lipids   Total Cholesterol: 197  (09/15/2008)   LDL: 99  (09/15/2008)   LDL Direct: 111  (09/15/2008)   HDL: 47  (09/15/2008)   Triglycerides: 257  (09/15/2008)    SGOT (AST): 14  (01/23/2009)   SGPT (ALT): 9  (01/23/2009) CMP ordered    Alkaline phosphatase: 86  (01/23/2009)   Total bilirubin: 0.3  (01/23/2009)    Lipid flowsheet reviewed?: Yes   Progress toward LDL goal: Unchanged  Hypertension   Last Blood Pressure: 149 / 62  (05/24/2009)   Serum creatinine: 0.85  (01/09/2009)   Serum potassium 3.9  (01/09/2009) CMP ordered     Hypertension flowsheet reviewed?: Yes   Progress toward BP goal: Unchanged  Self-Management Support :   Personal Goals (by the next clinic visit) :     Personal A1C goal: 7  (01/23/2009)     Personal blood pressure goal: 130/80  (01/08/2009)     Personal LDL goal: 70  (01/08/2009)  Patient will work on the following items until the next clinic visit to reach self-care goals:     Medications and monitoring: take my medicines every day, check my blood sugar, examine my feet every day  (05/24/2009)     Eating: drink diet soda or water instead of juice or soda, eat foods that are low in salt, eat  baked foods instead of fried foods  (05/24/2009)     Activity: take the stairs instead of the elevator  (05/24/2009)    Diabetes self-management support: Written self-care plan  (05/24/2009)   Diabetes care plan printed    Hypertension self-management support: Written self-care plan  (05/24/2009)   Hypertension self-care plan printed.    Lipid self-management support: Written self-care plan  (05/24/2009)   Lipid self-care plan printed.   Nursing Instructions: Give Pneumovax today   Process Orders Check Orders Results:     [facility]: ABN not required for this insurance Tests Sent for requisitioning (May 25, 2009 9:42 AM):     05/24/2009: [facility] -- T-Comprehensive Metabolic Panel [id number] (signed)    Laboratory Results   Blood Tests   Date/Time Received: May 24, 2009 3:01 PM  Date/Time Reported: Earleen Reaper  May 24, 2009 3:01 PM   HGBA1C: 7.2%   (Normal Range: Non-Diabetic - 3-6%   Control Diabetic - 6-8%) CBG Random:: 187mg /dL       Pneumovax Vaccine    Vaccine Type: Pneumovax    Site: left deltoid    Mfr: Merck    Dose: 0.5 ml    Route: IM    Given by: Morrison Old RN    Exp. Date: 08/16/2010    Lot #: [id number]    VIS given: 11/24/95 version given May 24, 2009.

## 2010-05-30 NOTE — Progress Notes (Signed)
Summary: Refill/gh  Phone Note Refill Request Message from:  Fax from Pharmacy on July 31, 2009 1:43 PM  Refills Requested: Medication #1:  LANTUS SOLOSTAR 100 UNIT/ML  SOLN Inject 25  units subcutaneously at bedtime   Last Refilled: 04/10/2009 Last labs and visit 05/24/09   Method Requested: Electronic Initial call taken by: Sander Nephew RN,  July 31, 2009 1:44 PM  Follow-up for Phone Call        Refill approved-nurse to complete Follow-up by: Jolene Provost MD,  July 31, 2009 7:58 PM  Additional Follow-up for Phone Call Additional follow up Details #1::       Additional Follow-up by: Sander Nephew RN,  August 01, 2009 2:57 PM    Prescriptions: LANTUS SOLOSTAR 100 UNIT/ML  SOLN (INSULIN GLARGINE) Inject 25  units subcutaneously at bedtime  #3 months x 6   Entered by:   Jolene Provost MD   Authorized by:   Milta Deiters MD   Signed by:   Jolene Provost MD on 07/31/2009   Method used:   Electronically to        [facility] (retail)       [address]       [place], [place]  [address]       Ph: [phone]       Fax: [phone]   RxID:   [id number]

## 2010-05-30 NOTE — Assessment & Plan Note (Signed)
Summary: CHECKUP/SB.   Vital Signs:  Patient profile:   65 year old female Height:      65.5 inches (166.37 cm) Weight:      157.5 pounds (71.59 kg) BMI:     25.90 Temp:     99.5 degrees F (37.50 degrees C) oral Pulse rate:   68 / minute BP sitting:   126 / 68  (left arm) Cuff size:   regular  Vitals Entered By: Mateo Flow Deborra Medina) (September 26, 2009 1:31 PM) CC: routine f/u Is Patient Diabetic? Yes Did you bring your meter with you today? No Pain Assessment Patient in pain? no      Nutritional Status BMI of 25 - 29 = overweight  Have you ever been in a relationship where you felt threatened, hurt or afraid?No   Does patient need assistance? Functional Status Self care Ambulation Normal   Primary Care Provider:  Jolene Provost MD  CC:  routine f/u.  History of Present Illness: 65 yo woman with PMH of HTN, HLD, DM, GERD, CAD s/p stenting and tobacco abuse who presents to the clinic for a regular follow up. Patient was seen May 2011. Patient only had complaints of muscle spasms where she was given vicodin and flexeril at an urgent care center. She had her electrolytes checked which were all WNL. Patient has no complaints or concerns. She denies having CP, SOB, cough, abdominal pain, N/V, changes in bowel habits, and urinary complaints or concerns. She also states her muscle spasms have completely resolved.         Preventive Screening-Counseling & Management  Alcohol-Tobacco     Alcohol drinks/day: 0     Smoking Status: current     Smoking Cessation Counseling: yes     Packs/Day: 1/2     Year Started: 1963  Current Medications (verified): 1)  Lantus Solostar 100 Unit/ml  Soln (Insulin Glargine) .... Inject 25  Units Subcutaneously At Bedtime 2)  Novolog 100 Unit/ml Soln (Insulin Aspart) .... As Directed 3)  Plavix 75 Mg Tabs (Clopidogrel Bisulfate) .... Take 1 Tablet Once Daily For 1 Year (1/08) 4)  Isosorbide Mononitrate Cr 30 Mg Tb24 (Isosorbide Mononitrate)  .... Take 1 Tablet By Mouth Once A Day 5)  Nexium 20 Mg Cpdr (Esomeprazole Magnesium) .... Take 1 Tablet By Mouth Once A Day 6)  Bd Ultra-Fine Pen Needles 29g X 12.41mm  Misc (Insulin Pen Needle) .... Use As Directed 7)  Hydrochlorothiazide 12.5 Mg Tabs (Hydrochlorothiazide) .... Take 1 Tablet By Mouth Once A Day 8)  Onetouch Ultra Test  Strp (Glucose Blood) .... Use As Directed. 9)  Nitrostat 0.4 Mg Subl (Nitroglycerin) .Marland Kitchen.. 1 Tablet Under Tongue At Onset of Chest Pain; You May Repeat Every 5 Minutes For Up To 3 Doses. 10)  Lisinopril 10 Mg Tabs (Lisinopril) .... Take One Tablet By Mouth Daily 11)  Metoprolol Succinate 100 Mg Xr24h-Tab (Metoprolol Succinate) .... Take One Tablet By Mouth Daily 12)  Metformin Hcl 500 Mg Tabs (Metformin Hcl) .... Please Take One Tab By Mouth Twice Daily 13)  Red Yeast Rice 600 Mg Caps (Red Yeast Rice Extract) .... Once Daily  Allergies: 1)  ! Codeine 2)  ! * Statins  Review of Systems      See HPI  Physical Exam  General:  alert and well-developed.   Head:  normocephalic and atraumatic.   Eyes:  vision grossly intact, pupils equal, pupils round, and pupils reactive to light.   Mouth:  pharynx pink and moist.  Neck:  supple, full ROM, and no masses.   Lungs:  normal respiratory effort and normal breath sounds.   Heart:  normal rate, regular rhythm, no murmur, and no gallop.   Abdomen:  soft, non-tender, and normal bowel sounds.   Msk:  normal ROM.   Pulses:  R radial normal and L radial normal.   Extremities:  no LE edema present Neurologic:  alert & oriented X3.     Impression & Recommendations:  Problem # 1:  HYPERTENSION (ICD-401.9) Assessment Improved Good control, will continue current regimen. Renal function wnl.   Her updated medication list for this problem includes:    Hydrochlorothiazide 12.5 Mg Tabs (Hydrochlorothiazide) .Marland Kitchen... Take 1 tablet by mouth once a day    Lisinopril 10 Mg Tabs (Lisinopril) .Marland Kitchen... Take one tablet by mouth  daily    Metoprolol Succinate 100 Mg Xr24h-tab (Metoprolol succinate) .Marland Kitchen... Take one tablet by mouth daily  BP today: 126/68 Prior BP: 139/73 (08/27/2009)  Prior 10 Yr Risk Heart Disease: N/A (01/08/2009)  Labs Reviewed: K+: 4.3 (08/27/2009) Creat: : 0.92 (08/27/2009)   Chol: 197 (09/15/2008)   HDL: 47 (09/15/2008)   LDL: 99 (09/15/2008)   TG: 257 (09/15/2008)  Problem # 2:  DIABETES MELLITUS, TYPE II (ICD-250.00) No recent hypoglycemic episodes. Good control, will continue current regimen.   Her updated medication list for this problem includes:    Lantus Solostar 100 Unit/ml Soln (Insulin glargine) ..... Inject 25  units subcutaneously at bedtime    Novolog 100 Unit/ml Soln (Insulin aspart) .Marland Kitchen... As directed    Lisinopril 10 Mg Tabs (Lisinopril) .Marland Kitchen... Take one tablet by mouth daily    Metformin Hcl 500 Mg Tabs (Metformin hcl) .Marland Kitchen... Please take one tab by mouth twice daily  Labs Reviewed: Creat: 0.92 (08/27/2009)    Reviewed HgBA1c results: 6.7 (08/27/2009)  7.2 (05/24/2009)  Problem # 3:  HYPERLIPIDEMIA (ICD-272.4) Lipids well controlled. Will check FLP at next appt when patient is fasting.   Labs Reviewed: SGOT: 13 (08/27/2009)   SGPT: 9 (08/27/2009)  Lipid Goals: Chol Goal: 200 (01/08/2009)   HDL Goal: 40 (01/08/2009)   LDL Goal: 70 (01/08/2009)   TG Goal: 150 (01/08/2009)  Prior 10 Yr Risk Heart Disease: N/A (01/08/2009)   HDL:47 (09/15/2008), 52 (01/14/2008)  LDL:99 (09/15/2008), 61 (01/14/2008)  Chol:197 (09/15/2008), 140 (01/14/2008)  Trig:257 (09/15/2008), 136 (01/14/2008)  Problem # 4:  TOBACCO USER (ICD-305.1) Assessment: Comment Only Pt states chantix made her hallucinate and thus she discontinued its use about 2 weeks ago. She reports smoking 1/2 PPD. I provided counseling to patient and suggested nicotine patch as she has never tried them before. Pt agreed, will continue to follow progression.    The following medications were removed from the medication  list:    Chantix Starting Month Pak 0.5 Mg X 11 & 1 Mg X 42 Tabs (Varenicline tartrate) .Marland Kitchen... Take as directed    Chantix Continuing Month Pak 1 Mg Tabs (Varenicline tartrate) .Marland Kitchen... Take as directed  Complete Medication List: 1)  Lantus Solostar 100 Unit/ml Soln (Insulin glargine) .... Inject 25  units subcutaneously at bedtime 2)  Novolog 100 Unit/ml Soln (Insulin aspart) .... As directed 3)  Plavix 75 Mg Tabs (Clopidogrel bisulfate) .... Take 1 tablet once daily for 1 year (1/08) 4)  Isosorbide Mononitrate Cr 30 Mg Tb24 (Isosorbide mononitrate) .... Take 1 tablet by mouth once a day 5)  Nexium 20 Mg Cpdr (Esomeprazole magnesium) .... Take 1 tablet by mouth once a day 6)  Bd Ultra-fine  Pen Needles 29g X 12.1mm Misc (Insulin pen needle) .... Use as directed 7)  Hydrochlorothiazide 12.5 Mg Tabs (Hydrochlorothiazide) .... Take 1 tablet by mouth once a day 8)  Onetouch Ultra Test Strp (Glucose blood) .... Use as directed. 9)  Nitrostat 0.4 Mg Subl (Nitroglycerin) .Marland Kitchen.. 1 tablet under tongue at onset of chest pain; you may repeat every 5 minutes for up to 3 doses. 10)  Lisinopril 10 Mg Tabs (Lisinopril) .... Take one tablet by mouth daily 11)  Metoprolol Succinate 100 Mg Xr24h-tab (Metoprolol succinate) .... Take one tablet by mouth daily 12)  Metformin Hcl 500 Mg Tabs (Metformin hcl) .... Please take one tab by mouth twice daily 13)  Red Yeast Rice 600 Mg Caps (Red yeast rice extract) .... Once daily  Patient Instructions: 1)  Please schedule a follow-up appointment in 6 months. 2)  Tobacco is very bad for your health and your loved ones! You Should stop smoking!. 3)  Stop Smoking Tips: Choose a Quit date. Cut down before the Quit date. decide what you will do as a substitute when you feel the urge to smoke(gum,toothpick,exercise). 4)  It is important that you exercise regularly at least 20 minutes 5 times a week. If you develop chest pain, have severe difficulty breathing, or feel very tired ,  stop exercising immediately and seek medical attention. 5)  Check your blood sugars regularly. If your readings are usually above : 170 or below 70 you should contact our office.    Prevention & Chronic Care Immunizations   Influenza vaccine: Not documented   Influenza vaccine deferral: Deferred  (01/23/2009)    Tetanus booster: Not documented   Td booster deferral: Deferred  (01/23/2009)    Pneumococcal vaccine: Pneumovax  (05/24/2009)    H. zoster vaccine: Not documented   H. zoster vaccine deferral: Deferred  (01/23/2009)  Colorectal Screening   Hemoccult: Not documented   Hemoccult action/deferral: Refused  (01/23/2009)    Colonoscopy: Not documented   Colonoscopy action/deferral: Refused  (05/24/2009)  Other Screening   Pap smear: Not documented   Pap smear action/deferral: Not indicated S/P hysterectomy  (01/08/2009)    Mammogram: Not documented   Mammogram action/deferral: Ordered  (01/08/2009)    DXA bone density scan: Not documented   Smoking status: current  (09/26/2009)   Smoking cessation counseling: yes  (09/26/2009)  Diabetes Mellitus   HgbA1C: 6.7  (08/27/2009)    Eye exam: Not documented   Diabetic eye exam action/deferral: Ophthalmology referral  (01/23/2009)    Foot exam: yes  (01/08/2009)   Foot exam action/deferral: Do today   High risk foot: Not documented   Foot care education: Not documented    Urine microalbumin/creatinine ratio: 100.0  (01/23/2009)   Urine microalbumin action/deferral: Ordered    Diabetes flowsheet reviewed?: Yes   Progress toward A1C goal: Improved  Lipids   Total Cholesterol: 197  (09/15/2008)   LDL: 99  (09/15/2008)   LDL Direct: 111  (09/15/2008)   HDL: 47  (09/15/2008)   Triglycerides: 257  (09/15/2008)    SGOT (AST): 13  (08/27/2009)   SGPT (ALT): 9  (08/27/2009)   Alkaline phosphatase: 81  (08/27/2009)   Total bilirubin: 0.3  (08/27/2009)    Lipid flowsheet reviewed?: Yes   Progress toward LDL goal:  At goal  Hypertension   Last Blood Pressure: 126 / 68  (09/26/2009)   Serum creatinine: 0.92  (08/27/2009)   Serum potassium 4.3  (08/27/2009)    Hypertension flowsheet reviewed?: Yes   Progress  toward BP goal: At goal  Self-Management Support :   Personal Goals (by the next clinic visit) :     Personal A1C goal: 7  (09/26/2009)     Personal blood pressure goal: 130/80  (09/26/2009)     Personal LDL goal: 70  (09/26/2009)    Patient will work on the following items until the next clinic visit to reach self-care goals:     Medications and monitoring: take my medicines every day  (09/26/2009)     Eating: drink diet soda or water instead of juice or soda, eat foods that are low in salt, eat baked foods instead of fried foods  (09/26/2009)     Activity: take a 30 minute walk every day  (09/26/2009)    Diabetes self-management support: Pre-printed educational material, Resources for patients handout, Written self-care plan  (09/26/2009)   Diabetes care plan printed    Hypertension self-management support: Pre-printed educational material, Resources for patients handout, Written self-care plan  (09/26/2009)   Hypertension self-care plan printed.    Lipid self-management support: Pre-printed educational material, Resources for patients handout, Written self-care plan  (09/26/2009)   Lipid self-care plan printed.      Resource handout printed.

## 2010-05-30 NOTE — Assessment & Plan Note (Signed)
Summary: BP/CHECK/(SIDHU)SB.   Vital Signs:  Patient profile:   65 year old female Height:      65.5 inches (166.37 cm) Weight:      158.6 pounds (71.36 kg) BMI:     25.82 Temp:     98.0 degrees F (36.67 degrees C) oral Pulse rate:   79 / minute BP sitting:   139 / 73  (right arm) Cuff size:   regular  Vitals Entered By: Lucky Rathke NT II (Aug 27, 2009 1:56 PM) CC: BP CHECK /  A1C DONE   - CBG=188   Is Patient Diabetic? Yes Did you bring your meter with you today? SYSTEM DOWN Pain Assessment Patient in pain? no      Nutritional Status BMI of > 30 = obese CBG Result 188  Have you ever been in a relationship where you felt threatened, hurt or afraid?No   Does patient need assistance? Functional Status Self care Ambulation Normal Comments BP CHECK   /  A1C DONE    /     Primary Care Provider:  Jolene Provost MD  CC:  BP CHECK /  A1C DONE   - CBG=188  .  History of Present Illness: 65 yo woman with PMH as mentioned in the EMR comes to the office for a follow up on her BP check and to check her HbA1C.  1. Patient reports that her BP was slightly up and Dr. Haroldine Laws increased her Lisinopril to 10 mg. She is here for a follow up on her BP.  2. Patient reports that she has been having muscle spasm that happen occasionally once a week. Patient was seen in the urgent care for this and was prescribed Vicodin and Flexril for this and reports that they help a little bit.   She denies any other problems.    Preventive Screening-Counseling & Management  Alcohol-Tobacco     Alcohol drinks/day: 0     Smoking Status: current     Smoking Cessation Counseling: yes     Packs/Day: 1/2     Year Started: 1963  Caffeine-Diet-Exercise     Does Patient Exercise: yes     Type of exercise: WALK  Problems Prior to Update: 1)  Tobacco User  (ICD-305.1) 2)  Claudication, Intermittent  (ICD-443.9) 3)  Chest Tightness-pressure-other  [id number]) 4)  Claudication  (ICD-443.9) 5)   Adverse Drug Reaction  (ICD-995.20) 6)  Unspecified Backache  (ICD-724.5) 7)  Uri  (ICD-465.9) 8)  Hepatotoxicity, Drug-induced, Risk of  (ICD-V58.69) 9)  Rotator Cuff Syndrome, Right  (ICD-726.10) 10)  Coronary Artery Disease  (ICD-414.00) 11)  Family History Diabetes 1st Degree Relative  (ICD-V18.0) 12)  Peripheral Neuropathy  (ICD-356.9) 13)  Diabetes Mellitus, Type II  (ICD-250.00) 14)  Anemia-nos  (ICD-285.9) 15)  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 Inject 25  Units Subcutaneously At Bedtime 2)  Novolog 100 Unit/ml Soln (Insulin Aspart) .... As Directed 3)  Plavix 75 Mg Tabs (Clopidogrel Bisulfate) .... Take 1 Tablet Once Daily For 1 Year (1/08) 4)  Isosorbide Mononitrate Cr 30 Mg Tb24 (Isosorbide Mononitrate) .... Take 1 Tablet By Mouth Once A Day 5)  Prilosec 40 Mg  Cpdr (Omeprazole) .... Take 1 Tablet By Mouth Once A Day As Needed 6)  Bd Ultra-Fine Pen Needles  29g X 12.7mm  Misc (Insulin Pen Needle) .... Use As Directed 7)  Hydrochlorothiazide 12.5 Mg Tabs (Hydrochlorothiazide) .... Take 1 Tablet By Mouth Once A Day 8)  Onetouch Ultra Test  Strp (Glucose Blood) .... Use As Directed. 9)  Nitrostat 0.4 Mg Subl (Nitroglycerin) .Marland Kitchen.. 1 Tablet Under Tongue At Onset of Chest Pain; You May Repeat Every 5 Minutes For Up To 3 Doses. 10)  Lisinopril 10 Mg Tabs (Lisinopril) .... Take One Tablet By Mouth Daily 11)  Metoprolol Succinate 100 Mg Xr24h-Tab (Metoprolol Succinate) .... Take One Tablet By Mouth Daily 12)  Metformin Hcl 500 Mg Tabs (Metformin Hcl) .... Please Take One Tab By Mouth Twice Daily 13)  Red Yeast Rice 600 Mg Caps (Red Yeast Rice Extract) .... Once Daily 14)  Chantix Starting Month Pak 0.5 Mg X 11 & 1 Mg X 42 Tabs (Varenicline Tartrate) .... Take  As Directed 15)  Chantix Continuing Month Pak 1 Mg Tabs (Varenicline Tartrate) .... Take As Directed  Current Medications (verified): 1)  Lantus Solostar 100 Unit/ml  Soln (Insulin Glargine) .... Inject 25  Units Subcutaneously At Bedtime 2)  Novolog 100 Unit/ml Soln (Insulin Aspart) .... As Directed 3)  Plavix 75 Mg Tabs (Clopidogrel Bisulfate) .... Take 1 Tablet Once Daily For 1 Year (1/08) 4)  Isosorbide Mononitrate Cr 30 Mg Tb24 (Isosorbide Mononitrate) .... Take 1 Tablet By Mouth Once A Day 5)  Prilosec 40 Mg  Cpdr (Omeprazole) .... Take 1 Tablet By Mouth Once A Day As Needed 6)  Bd Ultra-Fine Pen Needles 29g X 12.49mm  Misc (Insulin Pen Needle) .... Use As Directed 7)  Hydrochlorothiazide 12.5 Mg Tabs (Hydrochlorothiazide) .... Take 1 Tablet By Mouth Once A Day 8)  Onetouch Ultra Test  Strp (Glucose Blood) .... Use As Directed. 9)  Nitrostat 0.4 Mg Subl (Nitroglycerin) .Marland Kitchen.. 1 Tablet Under Tongue At Onset of Chest Pain; You May Repeat Every 5 Minutes For Up To 3 Doses. 10)  Lisinopril 10 Mg Tabs (Lisinopril) .... Take One Tablet By Mouth Daily 11)  Metoprolol Succinate 100 Mg Xr24h-Tab (Metoprolol Succinate) .... Take One Tablet By Mouth Daily 12)  Metformin Hcl 500 Mg Tabs (Metformin Hcl) .... Please Take One Tab By Mouth Twice Daily 13)  Red Yeast Rice 600 Mg Caps (Red Yeast Rice Extract) .... Once Daily 14)  Chantix Starting Month Pak 0.5 Mg X 11 & 1 Mg X 42 Tabs (Varenicline Tartrate) .... Take As Directed 15)  Chantix Continuing Month Pak 1 Mg Tabs (Varenicline Tartrate) .... Take As Directed  Allergies (verified): 1)  ! Codeine 2)  ! * Statins  Past History:  Family History: Last updated: 05/09/2008 Family History Diabetes 1st degree relative - mother Family History Hypertension - father She has no siblings with heart disease.  Neither parent has heart disease.    Social History: Last updated: 01/08/2009 Lives alone in [place].  Works for [employer]  center Current Smoker (5 cigs a day) Alcohol use-no Drug use-no Regular exercise-no Tries to follow ADA diet.  Risk Factors: Alcohol Use: 0 (08/27/2009) Exercise: yes (08/27/2009)  Risk Factors: Smoking Status: current (08/27/2009) Packs/Day: 1/2 (08/27/2009)  Past Medical History: Reviewed history from 07/31/2009 and no changes required. Coronary artery disease, s/p stent RCA 04-2005, and recath 05-21-06 with patent stent, but progression of disease to distal RCA     --Myoview 11/10: EF 70% normal perfusion Leg pain    --ABI 11/10: 0.97/1.01 Diabetes Mellitus Type II, previously uncontrolled Peripheral neuropathy DIabetic gastroparesis Hypertension, previously uncontrolled  Hyperlipidemia GERD Comminuted, displaced inferior pole, left patellar  fracture. Right shoulder frozen shoulder and impingement.   w/  Partial-thickness rotator cuff tear.   Past Surgical History: Reviewed history from 05/09/2008 and no changes required. Cholecystectomy Caths  Breast surgery, which was benign, details unknown.  Partial hysterectomy. Arm surgery for tendon repairs following an injury.  Left knee inferior pole patellectomy with patellar tendon  repair.  Right shoulder arthroscopy with extensive intra-   articular debridement including arthroscopic capsular release and   arthroscopic subacromial decompression and debridement of partial-   thickness rotator cuff tear.   Family History: Reviewed history from 05/09/2008 and no changes required. Family History Diabetes 1st degree relative - mother Family History Hypertension - father She has no siblings with heart disease.  Neither parent has heart disease.    Social History: Reviewed history from 01/08/2009 and no changes required. Lives alone in [place].  Works for [employer] center Current Smoker (5 cigs a day) Alcohol use-no Drug use-no Regular exercise-no Tries to follow ADA diet.  Review of Systems      See  HPI  Physical Exam  General:  alert, well-developed, well-nourished, and well-hydrated.   Head:  normocephalic and atraumatic.   Mouth:  good dentition, pharynx pink and moist, no erythema, no exudates, and no posterior lymphoid hypertrophy.   Lungs:  normal respiratory effort, no accessory muscle use, normal breath sounds, no crackles, and no wheezes.   Heart:  normal rate, regular rhythm, no murmur, and no gallop.   Abdomen:  soft, non-tender, and normal bowel sounds.   Pulses:  R radial normal.   Extremities:  No clubbing, cyanosis, edema, or deformity noted with normal full range of motion of all joints.   Neurologic:  alert & oriented X3 and strength normal in all extremities.     Impression & Recommendations:  Problem # 1:  TOBACCO USER (ICD-305.1)  Councelled her regarding cesation. Reports chantix is helping a little bit. Encouraged her to cut down slowly.  Her updated medication list for this problem includes:    Chantix Starting Month Pak 0.5 Mg X 11 & 1 Mg X 42 Tabs (Varenicline tartrate) .Marland Kitchen... Take as directed    Chantix Continuing Month Pak 1 Mg Tabs (Varenicline tartrate) .Marland Kitchen... Take as directed  Orders: T-Comprehensive Metabolic Panel ([id number]) T-CBC No Diff [id number])  Problem # 2:  DIABETES MELLITUS, TYPE II (ICD-250.00) Excellent control. Continue current regimen.  Her updated medication list for this problem includes:    Lantus Solostar 100 Unit/ml Soln (Insulin glargine) ..... Inject 25  units subcutaneously at bedtime    Novolog 100 Unit/ml Soln (Insulin aspart) .Marland Kitchen... As directed    Lisinopril 10 Mg Tabs (Lisinopril) .Marland Kitchen... Take one tablet by mouth daily    Metformin Hcl 500 Mg Tabs (Metformin hcl) .Marland Kitchen... Please take one tab by mouth twice daily  Orders: T- Capillary Blood Glucose [id number]) T-Hgb A1C (in-house) [id number]) T-Comprehensive Metabolic Panel ([id number]) T-CBC No Diff [id number])  Labs Reviewed: Creat: 1.01 (05/24/2009)    Reviewed  HgBA1c results: 6.7 (08/27/2009)  7.2 (05/24/2009)  Problem # 3:  HYPERTENSION (ICD-401.9)  Well controlled on current regimen. Cont current meds.  Her updated medication list for this problem includes:    Hydrochlorothiazide 12.5 Mg Tabs (Hydrochlorothiazide) .Marland Kitchen... Take 1 tablet by mouth once a day    Lisinopril 10 Mg Tabs (Lisinopril) .Marland Kitchen... Take one tablet by mouth daily    Metoprolol Succinate 100 Mg Xr24h-tab (Metoprolol succinate) .Marland Kitchen... Take one tablet by mouth daily  BP today: 139/73 Prior BP: 160/72 (07/31/2009)  Prior 10 Yr Risk Heart Disease: N/A (01/08/2009)  Labs Reviewed: K+: 4.4 (05/24/2009) Creat: : 1.01 (05/24/2009)   Chol: 197 (09/15/2008)   HDL: 47 (09/15/2008)   LDL: 99 (09/15/2008)   TG: 257 (09/15/2008)  Orders: T-Comprehensive Metabolic Panel ([id number]) T-CBC No Diff [id number])  Problem # 4:  UNSPECIFIED BACKACHE (ICD-724.5)  Patient reports muscle spasms occasionally, once every week.Not related to an joint pathology. Patient was prescribed Vicodin and flexril by urgent care. Will check CBC, CMP to rul eout electrolyte abnormalities and to check BUN/C.  Orders: T-Comprehensive Metabolic Panel ([id number]) T-CBC No Diff [id number])  Complete Medication List: 1)  Lantus Solostar 100 Unit/ml Soln (Insulin glargine) .... Inject 25  units subcutaneously at bedtime 2)  Novolog 100 Unit/ml Soln (Insulin aspart) .... As directed 3)  Plavix 75 Mg Tabs (Clopidogrel bisulfate) .... Take 1 tablet once daily for 1 year (1/08) 4)  Isosorbide Mononitrate Cr 30 Mg Tb24 (Isosorbide mononitrate) .... Take 1 tablet by mouth once a day 5)  Prilosec 40 Mg Cpdr (Omeprazole) .... Take 1 tablet by mouth once a day as needed 6)  Bd Ultra-fine Pen Needles 29g X 12.15mm Misc (Insulin pen needle) .... Use as directed 7)  Hydrochlorothiazide 12.5 Mg Tabs (Hydrochlorothiazide) .... Take 1 tablet by mouth once a day 8)  Onetouch Ultra Test Strp (Glucose blood) .... Use as  directed. 9)  Nitrostat 0.4 Mg Subl (Nitroglycerin) .Marland Kitchen.. 1 tablet under tongue at onset of chest pain; you may repeat every 5 minutes for up to 3 doses. 10)  Lisinopril 10 Mg Tabs (Lisinopril) .... Take one tablet by mouth daily 11)  Metoprolol Succinate 100 Mg Xr24h-tab (Metoprolol succinate) .... Take one tablet by mouth daily 12)  Metformin Hcl 500 Mg Tabs (Metformin hcl) .... Please take one tab by mouth twice daily 13)  Red Yeast Rice 600 Mg Caps (Red yeast rice extract) .... Once daily 14)  Chantix Starting Month Pak 0.5 Mg X 11 & 1 Mg X 42 Tabs (Varenicline tartrate) .... Take as directed 15)  Chantix Continuing Month Pak 1 Mg Tabs (Varenicline tartrate) .... Take as directed  Patient Instructions: 1)  Please schedule a follow-up appointment in 6 months. 2)  Take all the medications as advised below. 3)  Tobacco is very bad for your health and your loved ones! You Should stop smoking!. 4)  Stop Smoking Tips: Choose a Quit date. Cut down before the Quit date. decide what you will do as a substitute when you feel the urge to smoke(gum,toothpick,exercise). Process Orders Check Orders Results:     [facility]: ABN not required for this insurance Tests Sent for requisitioning (Aug 27, 2009 2:45 PM):     08/27/2009: [facility] -- T-Comprehensive Metabolic Panel [id number] (signed)     08/27/2009: [facility] -- T-CBC No Diff [id number] (signed)    Prevention & Chronic Care Immunizations   Influenza vaccine: Not documented   Influenza vaccine deferral: Deferred  (01/23/2009)    Tetanus booster: Not documented   Td booster deferral: Deferred  (01/23/2009)    Pneumococcal vaccine: Pneumovax  (05/24/2009)    H. zoster vaccine: Not documented   H. zoster vaccine deferral: Deferred  (01/23/2009)  Colorectal Screening   Hemoccult: Not documented   Hemoccult action/deferral: Refused  (01/23/2009)    Colonoscopy: Not documented    Colonoscopy action/deferral: Refused  (05/24/2009)  Other Screening   Pap smear: Not documented   Pap smear  action/deferral: Not indicated S/P hysterectomy  (01/08/2009)    Mammogram: Not documented   Mammogram action/deferral: Ordered  (01/08/2009)    DXA bone density scan: Not documented   Smoking status: current  (08/27/2009)   Smoking cessation counseling: yes  (08/27/2009)  Diabetes Mellitus   HgbA1C: 6.7  (08/27/2009)    Eye exam: Not documented   Diabetic eye exam action/deferral: Ophthalmology referral  (01/23/2009)    Foot exam: yes  (01/08/2009)   Foot exam action/deferral: Do today   High risk foot: Not documented   Foot care education: Not documented    Urine microalbumin/creatinine ratio: 100.0  (01/23/2009)   Urine microalbumin action/deferral: Ordered  Lipids   Total Cholesterol: 197  (09/15/2008)   LDL: 99  (09/15/2008)   LDL Direct: 111  (09/15/2008)   HDL: 47  (09/15/2008)   Triglycerides: 257  (09/15/2008)    SGOT (AST): 11  (05/24/2009)   SGPT (ALT): 12  (05/24/2009) CMP ordered    Alkaline phosphatase: 91  (05/24/2009)   Total bilirubin: 0.2  (05/24/2009)  Hypertension   Last Blood Pressure: 139 / 73  (08/27/2009)   Serum creatinine: 1.01  (05/24/2009)   Serum potassium 4.4  (05/24/2009) CMP ordered   Self-Management Support :   Personal Goals (by the next clinic visit) :     Personal A1C goal: 7  (01/23/2009)     Personal blood pressure goal: 130/80  (01/08/2009)     Personal LDL goal: 70  (01/08/2009)    Patient will work on the following items until the next clinic visit to reach self-care goals:     Medications and monitoring: take my medicines every day, check my blood sugar, bring all of my medications to every visit, examine my feet every day  (08/27/2009)     Eating: eat more vegetables, use fresh or frozen vegetables, eat foods that are low in salt, eat baked foods instead of fried foods, eat fruit for snacks and desserts, limit or  avoid alcohol  (08/27/2009)     Activity: take a 30 minute walk every day  (08/27/2009)    Diabetes self-management support: Resources for patients handout  (08/27/2009)    Hypertension self-management support: Resources for patients handout  (08/27/2009)    Lipid self-management support: Resources for patients handout  (08/27/2009)        Resource handout printed.  Laboratory Results   Blood Tests   Date/Time Received: Aug 27, 2009 2:18 PM  Date/Time Reported: Maryan Rued  Aug 27, 2009 2:18 PM   HGBA1C: 6.7%   (Normal Range: Non-Diabetic - 3-6%   Control Diabetic - 6-8%) CBG Random:: 188mg /dL

## 2010-05-30 NOTE — Progress Notes (Signed)
Summary: med refill/gp  Phone Note Refill Request Message from:  Fax from Pharmacy on November 07, 2009 12:27 PM  Refills Requested: Medication #1:  METFORMIN HCL 500 MG TABS Please take one tab by mouth twice daily   Last Refilled: 10/10/2009  Method Requested: Electronic Initial call taken by: Morrison Old RN,  November 07, 2009 12:27 PM  Follow-up for Phone Call        Refill approved-nurse to complete Follow-up by: Jolene Provost MD,  November 07, 2009 3:14 PM  Additional Follow-up for Phone Call Additional follow up Details #1::        Rx called to pharmacy - [facility]acy. Additional Follow-up by: Morrison Old RN,  November 07, 2009 4:06 PM    Prescriptions: METFORMIN HCL 500 MG TABS (METFORMIN HCL) Please take one tab by mouth twice daily  #60 x 6   Entered and Authorized by:   Jolene Provost MD   Signed by:   Jolene Provost MD on 11/07/2009   Method used:   Telephoned to ...       [facility] (retail)       [address]       [place], [place]  [address]       Ph: [phone]       Fax: [phone]   RxID:   [id number]

## 2010-05-30 NOTE — Progress Notes (Signed)
Summary: CALLING REGARDING PLAVIX   Phone Note Call from Patient Call back at [phone]   Caller: Patient Reason for Call: Talk to Nurse Summary of Call: PT [place] Initial call taken by: Delsa Sale,  October 09, 2009 10:08 AM  Follow-up for Phone Call        as of Fri she is retiring and will not be able to afford Plavix.  is there anything else she can take? Follow-up by: Devra Dopp, LPN,  June 14, [id number] AM  Additional Follow-up for Phone Call Additional follow up Details #1::        Discussed with Dr Haroldine Laws he said the patient could stop the Plavix but wanted to know how she was going to afford to continue smoking.  I left patient a message to call the office back.  If she continues to smoke she will ruin her stent per Dr Haroldine Laws.   Janan Halter, RN, BSN  October 09, 2009 5:09 PM    Additional Follow-up for Phone Call Additional follow up Details #2::    returning call, Darnell Level  October 10, 2009 8:38 AM   pt aware Kevan Rosebush, RN  October 11, 2009 3:52 PM

## 2010-05-30 NOTE — Progress Notes (Signed)
Summary: chantix  Medications Added CHANTIX STARTING MONTH PAK 0.5 MG X 11 & 1 MG X 42 TABS (VARENICLINE TARTRATE) take as directed CHANTIX CONTINUING MONTH PAK 1 MG TABS (VARENICLINE TARTRATE) take as directed       Phone Note Other Incoming   Summary of Call: received note from pharmacy stating pt wanted rx for chantix starter and maintence packs, per Dr Haroldine Laws this is ok, rx sent  Initial call taken by: Kevan Rosebush, RN,  August 09, 2009 5:45 PM    New/Updated Medications: CHANTIX STARTING MONTH PAK 0.5 MG X 11 & 1 MG X 42 TABS (VARENICLINE TARTRATE) take as directed CHANTIX CONTINUING MONTH PAK 1 MG TABS (VARENICLINE TARTRATE) take as directed Prescriptions: CHANTIX CONTINUING MONTH PAK 1 MG TABS (VARENICLINE TARTRATE) take as directed  #1 pack x 1   Entered by:   Kevan Rosebush, RN   Authorized by:   Jolaine Artist, MD, [facility]   Signed by:   Kevan Rosebush, RN on 08/09/2009   Method used:   Electronically to        [facility] (retail)       [address]       [place], [place]  [address]       Ph: [phone]       Fax: [phone]   RxIDER:[id number] CHANTIX STARTING MONTH PAK 0.5 MG X 11 & 1 MG X 42 TABS (VARENICLINE TARTRATE) take as directed  #1 pack x 0   Entered by:   Kevan Rosebush, RN   Authorized by:   Jolaine Artist, MD, [facility]   Signed by:   Kevan Rosebush, RN on 08/09/2009   Method used:   Electronically to        [facility] (retail)       [address]       [place], [place]  [address]       Ph: [phone]       Fax: [phone]   RxID:   [id number]

## 2010-05-30 NOTE — Assessment & Plan Note (Signed)
Summary: PER CHECK OUT/SF  Medications Added NOVOLOG 100 UNIT/ML SOLN (INSULIN ASPART) as directed PRILOSEC 40 MG  CPDR (OMEPRAZOLE) Take 1 tablet by mouth once a day as needed LISINOPRIL 10 MG TABS (LISINOPRIL) Take one tablet by mouth daily METOPROLOL SUCCINATE 100 MG XR24H-TAB (METOPROLOL SUCCINATE) Take one tablet by mouth daily RED YEAST RICE 600 MG CAPS (RED YEAST RICE EXTRACT) once daily        Visit Type:  Follow-up Primary Provider:  Jolene Provost MD  CC:  1 time with CP Sat.  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 [name].  She has a history of coronary artery disease status post Taxus drug-eluting stent to the RCA in January 2007.  She also has a history of diabetes, hypertension, hyperlipidemia and ongoing tobacco use.  Most recent cardiac catheterization was January 2008, which showed minimal nonobstructive coronary artery disease with a patent RCA stent; however, there was a 90% stenosis in the midsection of the small PDA.  This was treated medically.   Returns for 6 month f/u.  Had Myoview in 11/10 for atypical CP. EF 70% No ischemia/infarct. ABIs 11/10 0.97/1.01  Overall doing well. Feels tired. Over the weekend was having severe pain in L shoulder and back. Also had CP with depp breathing. Went to urgent care. Gave her flexeril/hydrocodone. Which she couldn't tolerate. Feels like it is getting a little better. No exertional CP.   Continue to smoke 1/2-1ppd. Has leg pain due to peripheral neuropathy. No claudication.   Says BP has been doing pretty well.   Current Medications (verified): 1)  Lantus Solostar 100 Unit/ml  Soln (Insulin Glargine) .... Inject 25  Units Subcutaneously At Bedtime 2)  Novolog 100 Unit/ml Soln (Insulin Aspart) .... As Directed 3)  Plavix 75 Mg Tabs (Clopidogrel Bisulfate) .... Take 1 Tablet Once Daily For 1 Year (1/08) 4)   Isosorbide Mononitrate Cr 30 Mg Tb24 (Isosorbide Mononitrate) .... Take 1 Tablet By Mouth Once A Day 5)  Prilosec 40 Mg  Cpdr (Omeprazole) .... Take 1 Tablet By Mouth Once A Day As Needed 6)  Bd Ultra-Fine Pen Needles 29g X 12.40mm  Misc (Insulin Pen Needle) .... Use As Directed 7)  Hydrochlorothiazide 12.5 Mg Tabs (Hydrochlorothiazide) .... Take 1 Tablet By Mouth Once A Day 8)  Onetouch Ultra Test  Strp (Glucose Blood) .... Use As Directed. 9)  Nitrostat 0.4 Mg Subl (Nitroglycerin) .Marland Kitchen.. 1 Tablet Under Tongue At Onset of Chest Pain; You May Repeat Every 5 Minutes For Up To 3 Doses. 10)  Lisinopril 5 Mg Tabs (Lisinopril) .... Take 1 Tablet By Mouth Once A Day 11)  Metoprolol Succinate 100 Mg Xr24h-Tab (Metoprolol Succinate) .... Take One Tablet By Mouth Daily 12)  Metformin Hcl 500 Mg Tabs (Metformin Hcl) .... Please Take One Tab By Mouth Twice Daily 13)  Red Yeast Rice 600 Mg Caps (Red Yeast Rice Extract) .... Once Daily  Allergies: 1)  ! Codeine 2)  ! * Statins  Past History:  Past Medical History: Coronary artery disease, s/p stent RCA 04-2005, and recath 05-21-06 with patent stent, but progression of disease to distal RCA     --Myoview 11/10: EF 70% normal perfusion Leg pain    --ABI 11/10: 0.97/1.01 Diabetes Mellitus Type II, previously uncontrolled Peripheral neuropathy DIabetic gastroparesis Hypertension, previously uncontrolled Hyperlipidemia GERD Comminuted, displaced inferior pole, left patellar  fracture. Right shoulder frozen shoulder and impingement.  w/  Partial-thickness rotator cuff tear.   Review of Systems       As per HPI and past medical history; otherwise all systems negative..  Vital Signs:  Patient profile:   65 year old female Height:      65.5 inches Weight:      157 pounds BMI:     25.82 Pulse rate:   72 / minute BP sitting:   160 / 72  (left arm) Cuff size:   regular  Vitals Entered By: Mignon Pine, RMA (July 31, 2009 2:44 PM)  Physical  Exam  General:  Gen: well appearing. no resp difficulty HEENT: normal Neck: supple. no JVD. Carotids 2+ bilat; no bruits.. Cor: PMI nondisplaced. Regular rate & rhythm. No rubs, murmur. +s4 Lungs: clear Abdomen: soft, nontender, nondistended. No hepatosplenomegaly. No bruits or masses. Good bowel sounds. Extremities: no cyanosis, clubbing, rash, edema Neuro: alert & orientedx3, cranial nerves grossly intact. moves all 4 extremities w/o difficulty. affect pleasant    Impression & Recommendations:  Problem # 1:  CORONARY ARTERY DISEASE (ICD-414.00) Stable. No evidence of ischemia. Recent stress test reassuring. . Continue current regimen.  Problem # 2:  HYPERTENSION (ICD-401.9) BP is up. Increase lisinopril to 10 once daily. She will get a BP cuff and keep a BP log. Follow-up with PCP.   Problem # 3:  TOBACCO USER (ICD-305.1) Counseled on need to quit.  Other Orders: EKG w/ Interpretation (93000)  Patient Instructions: 1)  Increase Lisinopril to 10mg  daily 2)  Your physician has requested that you regularly monitor and record your blood pressure readings at home.  Please use the same machine at the same time of day to check your readings and record them to bring to your follow-up visit. 3)  Follow up in  year Prescriptions: LISINOPRIL 10 MG TABS (LISINOPRIL) Take one tablet by mouth daily  #30 x 12   Entered by:   Kevan Rosebush, RN   Authorized by:   Jolaine Artist, MD, [facility]   Signed by:   Kevan Rosebush, RN on 07/31/2009   Method used:   Electronically to        [facility] (retail)       [address] [place], [place]  [address]       Ph: [phone]       Fax: [phone]   RxID:   [id number] NITROSTAT 0.4 MG SUBL (NITROGLYCERIN) 1 tablet under tongue at onset of chest pain; you may repeat every 5 minutes for up to 3 doses.  #25 x 11   Entered by:   Mignon Pine, RMA   Authorized by:   Jolaine Artist, MD, [facility]   Signed by:   Mignon Pine, RMA on 07/31/2009   Method used:   Faxed to ...       [facility] (retail)       [address] [place], [place]  [address]       Ph: [phone]       Fax: [phone]   RxID:   [id number]

## 2010-06-14 ENCOUNTER — Other Ambulatory Visit: Payer: Self-pay | Admitting: *Deleted

## 2010-06-14 MED ORDER — INSULIN GLARGINE 100 UNIT/ML ~~LOC~~ SOLN: 25.0000 [IU] | Freq: Every day | SUBCUTANEOUS | Status: DC

## 2010-06-14 MED ORDER — INSULIN ASPART 100 UNIT/ML ~~LOC~~ SOLN: SUBCUTANEOUS | Status: DC

## 2010-07-16 LAB — GLUCOSE, CAPILLARY: Glucose-Capillary: 188 mg/dL — ABNORMAL HIGH (ref 70–99)

## 2010-08-02 LAB — POCT I-STAT, CHEM 8
BUN: 18 mg/dL (ref 6–23)
Calcium, Ion: 1.29 mmol/L (ref 1.12–1.32)
HCT: 39 % (ref 36.0–46.0)
Hemoglobin: 13.3 g/dL (ref 12.0–15.0)
Sodium: 139 mEq/L (ref 135–145)
TCO2: 23 mmol/L (ref 0–100)

## 2010-08-02 LAB — GLUCOSE, CAPILLARY
Glucose-Capillary: 143 mg/dL — ABNORMAL HIGH (ref 70–99)
Glucose-Capillary: 162 mg/dL — ABNORMAL HIGH (ref 70–99)

## 2010-08-04 LAB — DIFFERENTIAL
Eosinophils Absolute: 0 10*3/uL (ref 0.0–0.7)
Lymphocytes Relative: 11 % — ABNORMAL LOW (ref 12–46)
Lymphs Abs: 1.6 10*3/uL (ref 0.7–4.0)
Neutrophils Relative %: 83 % — ABNORMAL HIGH (ref 43–77)

## 2010-08-04 LAB — GLUCOSE, CAPILLARY: Glucose-Capillary: 146 mg/dL — ABNORMAL HIGH (ref 70–99)

## 2010-08-04 LAB — URINALYSIS, ROUTINE W REFLEX MICROSCOPIC
Bilirubin Urine: NEGATIVE
Ketones, ur: NEGATIVE mg/dL
Nitrite: NEGATIVE
Protein, ur: 100 mg/dL — AB
Urobilinogen, UA: 0.2 mg/dL (ref 0.0–1.0)

## 2010-08-04 LAB — URINE MICROSCOPIC-ADD ON

## 2010-08-04 LAB — CBC
Platelets: 222 10*3/uL (ref 150–400)
WBC: 14.9 10*3/uL — ABNORMAL HIGH (ref 4.0–10.5)

## 2010-08-04 LAB — POCT I-STAT, CHEM 8
BUN: 23 mg/dL (ref 6–23)
Creatinine, Ser: 1.1 mg/dL (ref 0.4–1.2)
Hemoglobin: 13.6 g/dL (ref 12.0–15.0)
Potassium: 3.8 mEq/L (ref 3.5–5.1)
Sodium: 141 mEq/L (ref 135–145)

## 2010-08-04 LAB — URINE CULTURE: Colony Count: NO GROWTH

## 2010-08-06 LAB — GLUCOSE, CAPILLARY: Glucose-Capillary: 168 mg/dL — ABNORMAL HIGH (ref 70–99)

## 2010-08-08 ENCOUNTER — Encounter: Payer: Self-pay | Admitting: Internal Medicine

## 2010-08-08 ENCOUNTER — Ambulatory Visit (INDEPENDENT_AMBULATORY_CARE_PROVIDER_SITE_OTHER): Payer: BC Managed Care – PPO | Admitting: Internal Medicine

## 2010-08-08 VITALS — BP 190/80 | HR 68 | Resp 18 | Ht 66.0 in | Wt 156.0 lb

## 2010-08-08 DIAGNOSIS — F17200 Nicotine dependence, unspecified, uncomplicated: Secondary | ICD-10-CM

## 2010-08-08 DIAGNOSIS — I10 Essential (primary) hypertension: Secondary | ICD-10-CM

## 2010-08-08 DIAGNOSIS — E785 Hyperlipidemia, unspecified: Secondary | ICD-10-CM

## 2010-08-08 DIAGNOSIS — I2510 Atherosclerotic heart disease of native coronary artery without angina pectoris: Secondary | ICD-10-CM

## 2010-08-08 MED ORDER — LISINOPRIL 20 MG PO TABS: 20.0000 mg | ORAL_TABLET | Freq: Every day | ORAL | Status: DC

## 2010-08-08 MED ORDER — RED YEAST RICE 600 MG PO CAPS: ORAL_CAPSULE | ORAL | Status: DC

## 2010-08-08 NOTE — Assessment & Plan Note (Signed)
Counseled on need to quit.

## 2010-08-08 NOTE — Assessment & Plan Note (Signed)
Has not had her lipids checked in 2 years. Unable to tolerate any statins. Will increase red yeast rice to bid. Check lipids with PCP in 2 weeks. Goal LDL < 70, if possible.

## 2010-08-08 NOTE — Assessment & Plan Note (Signed)
No evidence of ischemia. Continue current regimen.   

## 2010-08-08 NOTE — Progress Notes (Signed)
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 Velora Gross.  She has a history of coronary artery disease status post Taxus drug-eluting stent to the RCA in January 2007.  She also has a history of diabetes, hypertension, hyperlipidemia and ongoing tobacco use.  Most recent cardiac catheterization was January 2008, which showed minimal nonobstructive coronary artery disease with a patent RCA stent; however, there was a 90% stenosis in the midsection of the small PDA.  This was treated medically.  Had Myoview in 11/10 for atypical CP. EF 70% No ischemia/infarct. ABIs 11/10 0.97/1.01  Here for f/u. Says she feels really good. Denies CP or dyspnea. Still smoking 1/2 ppd. Tried to quit with Chantix but couldn't tolerate. Still taking Plavix.Unable to tolerate any statins. Takes Red Yeast Rice once a day without problem. Lipids not checked regularly/   ROS: All systems negative except as listed in HPI, PMH and Problem List.  No past medical history on file.  Current Outpatient Prescriptions  Medication Sig Dispense Refill  . clopidogrel (PLAVIX) 75 MG tablet Take 75 mg by mouth daily.        Marland Kitchen esomeprazole (NEXIUM) 20 MG capsule Take 20 mg by mouth daily before breakfast.        . hydrochlorothiazide (,MICROZIDE/HYDRODIURIL,) 12.5 MG capsule Take 12.5 mg by mouth daily.        . insulin aspart (NOVOLOG) 100 UNIT/ML injection As directed  10 mL  3  . insulin glargine (LANTUS SOLOSTAR) 100 UNIT/ML injection Inject 25 Units into the skin at bedtime.  10 mL  12  . isosorbide mononitrate (IMDUR) 30 MG 24 hr tablet Take 30 mg by mouth daily.        Marland Kitchen lisinopril (PRINIVIL,ZESTRIL) 10 MG tablet Take 10 mg by mouth daily.        . metoprolol (LOPRESSOR) 50 MG tablet Take 50 mg by mouth 2 (two) times daily.           PHYSICAL EXAM: Filed Vitals:   08/08/10 1641  BP: 180/80  Pulse: 68  Resp: 18   General:  Well appearing. No  resp difficulty HEENT: normal Neck: supple. JVP flat. Carotids 2+ bilaterally; no bruits. No lymphadenopathy or thryomegaly appreciated. Cor: PMI normal. Regular rate & rhythm. No rubs, gallops or murmurs. Lungs: clear Abdomen: soft, nontender, nondistended. No hepatosplenomegaly. No bruits or masses. Good bowel sounds. Extremities: no cyanosis, clubbing, rash, edema Neuro: alert & orientedx3, cranial nerves grossly intact. Moves all 4 extremities w/o difficulty. Affect pleasant.    ECG:  NSR 68. LAE No ST-T wave abnormalities.     ASSESSMENT & PLAN:

## 2010-08-08 NOTE — Assessment & Plan Note (Signed)
BP elevated in setting of medication non-compliance. Reviewed risk of dialysis in setting of poorly controlled HTN. Resume meds with increase of lisinopril to 20 qd. Suspect she will need another agent like spironolactone or amlodipine. F/u with PCP in 2 weeks.

## 2010-08-08 NOTE — Patient Instructions (Signed)
Increase Lisinopril to 20mg  daily Increase Red Yeast Rice to 2 tabs daily Follow up with your Primary Care MD in 2 weeks for your BP and cholesterol Your physician wants you to follow-up in: 1 year You will receive a reminder letter in the mail two months in advance. If you don't receive a letter, please call our office to schedule the follow-up appointment.

## 2010-08-13 ENCOUNTER — Ambulatory Visit (INDEPENDENT_AMBULATORY_CARE_PROVIDER_SITE_OTHER): Payer: BC Managed Care – PPO | Admitting: Internal Medicine

## 2010-08-13 ENCOUNTER — Encounter: Payer: Self-pay | Admitting: Internal Medicine

## 2010-08-13 DIAGNOSIS — E119 Type 2 diabetes mellitus without complications: Secondary | ICD-10-CM

## 2010-08-13 LAB — LIPID PANEL
LDL Cholesterol: 158 mg/dL — ABNORMAL HIGH (ref 0–99)
Total CHOL/HDL Ratio: 4.4 Ratio
VLDL: 22 mg/dL (ref 0–40)

## 2010-08-13 LAB — COMPREHENSIVE METABOLIC PANEL
ALT: 12 U/L (ref 0–35)
AST: 15 U/L (ref 0–37)
Alkaline Phosphatase: 95 U/L (ref 39–117)
Calcium: 10.1 mg/dL (ref 8.4–10.5)
Chloride: 108 mEq/L (ref 96–112)
Creat: 0.97 mg/dL (ref 0.40–1.20)

## 2010-08-13 LAB — TSH: TSH: 0.519 u[IU]/mL (ref 0.350–4.500)

## 2010-08-13 LAB — POCT GLYCOSYLATED HEMOGLOBIN (HGB A1C): Hemoglobin A1C: 7.2

## 2010-08-13 LAB — GLUCOSE, CAPILLARY: Glucose-Capillary: 114 mg/dL — ABNORMAL HIGH (ref 70–99)

## 2010-08-13 MED ORDER — LISINOPRIL-HYDROCHLOROTHIAZIDE 20-12.5 MG PO TABS: 1.0000 | ORAL_TABLET | Freq: Every day | ORAL | Status: DC

## 2010-08-13 MED ORDER — NICOTINE 7 MG/24HR TD PT24: 1.0000 | MEDICATED_PATCH | TRANSDERMAL | Status: DC

## 2010-08-13 NOTE — Patient Instructions (Addendum)
Smoking Cessation This document explains the best ways for you to quit smoking and new treatments to help. It lists new medicines that can double or triple your chances of quitting and quitting for good. It also considers ways to avoid relapses and concerns you may have about quitting, including weight gain. NICOTINE: A POWERFUL ADDICTION If you have tried to quit smoking, you know how hard it can be. It is hard because nicotine is a very addictive drug. For some people, it can be as addictive as heroin or cocaine. Usually, people make 2 or 3 tries, or more, before finally being able to quit. Each time you try to quit, you can learn about what helps and what hurts. Quitting takes hard work and a lot of effort, but you can quit smoking. QUITTING SMOKING IS ONE OF THE MOST IMPORTANT THINGS YOU WILL EVER DO:  You will live longer, feel better, and live better.   The impact on your body of quitting smoking is felt almost immediately:   Within 20 minutes, blood pressure decreases. Pulse returns to its normal level.   After 8 hours, carbon monoxide levels in the blood return to normal. Oxygen level increases.   After 24 hours, chance of heart attack starts to decrease. Breath, hair, and body stop smelling like smoke.   After 48 hours, damaged nerve endings begin to recover. Sense of taste and smell improve.   After 72 hours, the body is virtually free of nicotine. Bronchial tubes relax and breathing becomes easier.   After 2 to 12 weeks, lungs can hold more air. Exercise becomes easier and circulation improves.   Quitting will lower your chance of having a heart attack, stroke, cancer, or lung disease:   After 1 year, the risk of coronary heart disease is cut in half.   After 5 years, the risk of stroke falls to the same as a nonsmoker.   After 10 years, the risk of lung cancer is cut in half and the risk of other cancers decreases significantly.   After 15 years, the risk of coronary heart  disease drops, usually to the level of a nonsmoker.   If you are pregnant, quitting smoking will improve your chances of having a healthy baby.   The people you live with, especially your children, will be healthier.   You will have extra money to spend on things other than cigarettes.  FIVE KEYS TO QUITTING Studies have shown that these 5 steps will help you quit smoking and quit for good. You have the best chances of quitting if you use them together: 1. Get ready.  2. Get support and encouragement.  3. Learn new skills and behaviors.  4. Get medicine to reduce your nicotine addiction and use it correctly.  5. Be prepared for relapse or difficult situations. Be determined to continue trying to quit, even if you do not succeed at first.  1. GET READY  Set a quit date.   Change your environment.   Get rid of ALL cigarettes, ashtrays, matches, and lighters in your home, car, and place of work.   Do not let people smoke in your home.   Review your past attempts to quit. Think about what worked and what did not.   Once you quit, do not smoke. NOT EVEN A PUFF!  2. GET SUPPORT AND ENCOURAGEMENT Studies have shown that you have a better chance of being successful if you have help. You can get support in many ways.  Tell  your family, friends, and coworkers that you are going to quit and need their support. Ask them not to smoke around you.   Talk to your caregivers (doctor, dentist, nurse, pharmacist, psychologist, and/or smoking counselor).   Get individual, group, or telephone counseling and support. The more counseling you have, the better your chances are of quitting. Programs are available at local hospitals and health centers. Call your local health department for information about programs in your area.   Spiritual beliefs and practices may help some smokers quit.   Quit meters are small computer programs online or downloadable that keep track of quit statistics, such as amount  of "quit-time," cigarettes not smoked, and money saved.   Many smokers find one or more of the many self-help books available useful in helping them quit and stay off tobacco.  3. LEARN NEW SKILLS AND BEHAVIORS  Try to distract yourself from urges to smoke. Talk to someone, go for a walk, or occupy your time with a task.   When you first try to quit, change your routine. Take a different route to work. Drink tea instead of coffee. Eat breakfast in a different place.   Do something to reduce your stress. Take a hot bath, exercise, or read a book.   Plan something enjoyable to do every day. Reward yourself for not smoking.   Explore interactive web-based programs that specialize in helping you quit.  4. GET MEDICINE AND USE IT CORRECTLY Medicines can help you stop smoking and decrease the urge to smoke. Combining medicine with the above behavioral methods and support can quadruple your chances of successfully quitting smoking. The U.[place]. Food and Drug Administration (FDA) has approved 7 medicines to help you quit smoking. These medicines fall into 3 categories.  Nicotine replacement therapy (delivers nicotine to your body without the negative effects and risks of smoking):   Nicotine gum: Available over-the-counter.   Nicotine lozenges: Available over-the-counter.   Nicotine inhaler: Available by prescription.   Nicotine nasal spray: Available by prescription.   Nicotine skin patches (transdermal): Available by prescription and over-the-counter.   Antidepressant medicine (helps people abstain from smoking, but how this works is unknown):   Bupropion sustained-release (SR) tablets: Available by prescription.   Nicotinic receptor partial agonist (simulates the effect of nicotine in your brain):   Varenicline tartrate tablets: Available by prescription.   Ask your caregiver for advice about which medicines to use and how to use them. Carefully read the information on the package.    Everyone who is trying to quit may benefit from using a medicine. If you are pregnant or trying to become pregnant, nursing an infant, you are under age 18, or you smoke fewer than 10 cigarettes per day, talk to your caregiver before taking any nicotine replacement medicines.   You should stop using a nicotine replacement product and call your caregiver if you experience nausea, dizziness, weakness, vomiting, fast or irregular heartbeat, mouth problems with the lozenge or gum, or redness or swelling of the skin around the patch that does not go away.   Do not use any other product containing nicotine while using a nicotine replacement product.   Talk to your caregiver before using these products if you have diabetes, heart disease, asthma, stomach ulcers, you had a recent heart attack, you have high blood pressure that is not controlled with medicine, a history of irregular heartbeat, or you have been prescribed medicine to help you quit smoking.  5. BE PREPARED FOR RELAPSE OR   DIFFICULT SITUATIONS  Most relapses occur within the first 3 months after quitting. Do not be discouraged if you start smoking again. Remember, most people try several times before they finally quit.   You may have symptoms of withdrawal because your body is used to nicotine. You may crave cigarettes, be irritable, feel very hungry, cough often, get headaches, or have difficulty concentrating.   The withdrawal symptoms are only temporary. They are strongest when you first quit, but they will go away within 10 to 14 days.  Here are some difficult situations to watch for:  Alcohol. Avoid drinking alcohol. Drinking lowers your chances of successfully quitting.   Caffeine. Try to reduce the amount of caffeine you consume. It also lowers your chances of successfully quitting.   Other smokers. Being around smoking can make you want to smoke. Avoid smokers.   Weight gain. Many smokers will gain weight when they quit, usually  less than 10 pounds. Eat a healthy diet and stay active. Do not let weight gain distract you from your main goal, quitting smoking. Some medicines that help you quit smoking may also help delay weight gain. You can always lose the weight gained after you quit.   Bad mood or depression. There are a lot of ways to improve your mood other than smoking.  If you are having problems with any of these situations, talk to your caregiver. SPECIAL SITUATIONS OR CONDITIONS Studies suggest that everyone can quit smoking. Your situation or condition can give you a special reason to quit.  Pregnant women/New mothers: By quitting, you protect your baby's health and your own.   Hospitalized patients: By quitting, you reduce health problems and help healing.   Heart attack patients: By quitting, you reduce your risk of a second heart attack.   Lung, head, and neck cancer patients: By quitting, you reduce your chance of a second cancer.   Parents of children and adolescents: By quitting, you protect your children from illnesses caused by secondhand smoke.  QUESTIONS TO THINK ABOUT Think about the following questions before you try to stop smoking. You may want to talk about your answers with your caregiver.  Why do you want to quit?   If you tried to quit in the past, what helped and what did not?   What will be the most difficult situations for you after you quit? How will you plan to handle them?   Who can help you through the tough times? Your family? Friends? Caregiver?   What pleasures do you get from smoking? What ways can you still get pleasure if you quit?  Here are some questions to ask your caregiver:  How can you help me to be successful at quitting?   What medicine do you think would be best for me and how should I take it?   What should I do if I need more help?   What is smoking withdrawal like? How can I get information on withdrawal?  Quitting takes hard work and a lot of effort,  but you can quit smoking. FOR MORE INFORMATION Smokefree.gov (Inrails.tn) provides free, accurate, evidence-based information and professional assistance to help support the immediate and long-term needs of people trying to quit smoking. Document Released: 04/08/2001 Document Re-Released: 10/02/2009 [facility] Patient Information 2011 [place].   Follow up in 2 weeks time.

## 2010-08-13 NOTE — Progress Notes (Signed)
  Subjective:    Patient ID: Stephanie Gross, female    DOB: 01-28-1946, 65 y.o.   MRN: [id number]  HPI  [name] is a 66 year old female with past medical history of coronary artery disease, hypertension, current tobacco user and diabetes.  Patient is here today because she was told by her cardiologist that she is to to get her lipid profile checked and also needs to contact her PCP for blood pressure checkup.  Patient's diabetes was well-controlled with HbA1c of 7.2 today. Patient is due for an eye exam and has an appointment in May. Patient is already on the lisinopril. We did a foot exam today.  Patient's blood pressure is poorly controlled with a reading from the cardiologist office 190/100 and today it is 168/85. Patient is currently on 3 blood pressure medications which are HCTZ, lisinopril and metoprolol.  Patient is still smoking about 1/2 a pack a day. After extensive discussion and counseling, patient agreed to try nicotine patches today for smoking cessation.  Patient's lipid profile has not been checked in recent times. Patient had tried statins in the past which she could not tolerate. Patient is currently on red yeast rice 600mg  caps. I will check lipid profile today and suggest.   Review of Systems  Constitutional: Negative for fever, activity change and appetite change.  HENT: Negative for sore throat.   Respiratory: Negative for cough and shortness of breath.   Cardiovascular: Negative for chest pain and leg swelling.  Gastrointestinal: Negative for nausea, abdominal pain, diarrhea, constipation and abdominal distention.  Genitourinary: Negative for frequency, hematuria and difficulty urinating.  Neurological: Negative for dizziness and headaches.  Psychiatric/Behavioral: Negative for suicidal ideas and behavioral problems.       Objective:   Physical Exam  Constitutional: She is oriented to person, place, and time. She appears well-developed and well-nourished.    HENT:  Head: Normocephalic and atraumatic.  Eyes: Conjunctivae and EOM are normal. Pupils are equal, round, and reactive to light. No scleral icterus.  Neck: Normal range of motion. Neck supple. No JVD present. No thyromegaly present.  Cardiovascular: Normal rate, regular rhythm, normal heart sounds and intact distal pulses.  Exam reveals no gallop and no friction rub.   No murmur heard. Pulmonary/Chest: Effort normal and breath sounds normal. No respiratory distress. She has no wheezes. She has no rales.  Abdominal: Soft. Bowel sounds are normal. She exhibits no distension and no mass. There is no tenderness. There is no rebound and no guarding.  Musculoskeletal: Normal range of motion. She exhibits no edema and no tenderness.  Lymphadenopathy:    She has no cervical adenopathy.  Neurological: She is alert and oriented to person, place, and time.  Psychiatric: She has a normal mood and affect. Her behavior is normal.          Assessment & Plan:

## 2010-09-10 NOTE — Assessment & Plan Note (Signed)
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  [name] and is now presenting to have her initial visit with me.  She  has a history of coronary artery disease status post Taxus drug-eluting  stent to the RCA in January 2007.  She also has a history of diabetes,  hypertension and hyperlipidemia.  Most recent cardiac catheterization  was January 2008, which showed minimal nonobstructive coronary artery  disease with a patent RCA stent; however, there was a 90% stenosis in  the midsection of the small PDA.  This was treated medically.   She returns today.  She is doing quite well.  She denies any chest pain  or shortness of breath.  She does not exercise regularly but is very  active at work.   CURRENT MEDICATIONS:  1. Metformin 500 mg b.i.d.  2. Toprol 100 mg a day.  3. Norvasc 5 mg a day.  4. Neurontin 100 mg b.i.d.  5. Enalapril 20 mg a day.  6. Lantus insulin.  7. Aspirin 325 mg.  8. Plavix 75 mg.  9. Imdur 30 mg.  10.Simvastatin 80 mg.  11.Reglan.   PHYSICAL EXAMINATION:  GENERAL:  She is well-appearing, in no acute  distress.  Ambulates around the clinic without any respiratory  difficulty.  VITAL SIGNS:  Initial blood pressure 124/66, manual recheck was 132/70.  Heart rate 67.  Weight is 151.  HEENT:  Normal.  NECK:  Supple, no JVD.  Carotids are 2+ bilaterally, no bruits.  There  is no lymphadenopathy or thyromegaly.  CARDIAC:  Regular rate and rhythm.  No murmurs, rubs or gallops.  LUNGS:  Clear.  ABDOMEN:  Soft, nontender, nondistended.  There is a well-healed  cholecystectomy scar.  No masses.  No bruits.  Good bowel sounds.  EXTREMITIES:   Warm with no cyanosis, clubbing or edema, no rashes.  Good  pulses.  NEUROLOGIC:  Alert and oriented x3.  Cranial nerves II-XII are intact.  Moves all 4 extremities without difficulty.  Affect is pleasant.   ASSESSMENT AND PLAN:  1. Coronary artery disease is quite stable and without any evidence of      ischemia.  Continue current therapy.  2. Hyperlipidemia.  Most recent LDL is 49.  LFTs are normal.  Continue      current therapy.  Recheck in 6 months.  3. Hypertension, generally well-controlled although some readings      slightly elevated.  She will get a blood pressure cuff at home and      follow this herself.  She will bring me a blood pressure log in 6      months.  Given her coronary disease and diabetes, I will need to      keep her systolic blood pressure under  Cloverdale Bensimhon, MD     DRB/MedQ  DD: 09/07/2006  DT: 09/07/2006  Job #: [id number]

## 2010-09-10 NOTE — Op Note (Signed)
NAME:  Stephanie Gross, Stephanie Gross                 ACCOUNT NO.:  [id number]   MEDICAL RECORD NO.:  [id number]          PATIENT TYPE:  AMB   LOCATION:  SDS                          FACILITY:  [facility]   PHYSICIAN:  Doran Heater. [name], M.D. DATE OF BIRTH:  23-Aug-1945   DATE OF PROCEDURE:  06/28/2007  DATE OF DISCHARGE:  06/28/2007                               OPERATIVE REPORT   PREOPERATIVE DIAGNOSIS:  Right shoulder frozen shoulder and impingement.   POSTOPERATIVE DIAGNOSES:  1. Right shoulder frozen shoulder and impingement.  2. Partial-thickness rotator cuff tear.   PROCEDURE PERFORMED:  Right shoulder arthroscopy with extensive intra-  articular debridement including arthroscopic capsular release and  arthroscopic subacromial decompression and debridement of partial-  thickness rotator cuff tear.   SURGEON:  Doran Heater. [name], M.D.   ASSISTANT:  Abbott Pao. Doren Custard, P.A.   General anesthesia was used plus interscalene block.   ESTIMATED BLOOD LOSS:  Minimal.   FLUID REPLACEMENT:  1500 mL crystalloid.   Instrument counts correct.  No complications.  Perioperative antibiotics  given.   INDICATIONS:  The patient is a 65 year old female with a history of  worsening right shoulder pain and stiffness secondary to frozen  shoulder.  The patient presents now for operative treatment, having  failed conservative management consisting of injection therapy and  activity modifications.  Informed consent was obtained.   DESCRIPTION OF PROCEDURE:  After an adequate level of anesthesia was  achieved, the patient was positioned in the modified beach-chair  position.  Right shoulder examined under anesthesia, forward elevation  limited to 85 degrees, abduction 45 degrees, external rotation 0,  internal rotation about 30 degrees.  After examining of the shoulder, we  went ahead and manipulated the shoulder.  I was able to improve forward  elevation about 20 degrees and had a little bit of a pop with cross-arm  adduction and internal rotation but not much progress in external  rotation, was able to be about 30-40 degrees on that.  Following this,  this was as much as I could manipulate her comfortably.  I then went  ahead and sterilely prepped and draped the right shoulder for  arthroscopy.  We entered the shoulder arthroscopically using standard  portals, anterior, posterior and lateral portals.  We performed a 360-  degree capsular release.  The subscapularis was normal.  The rotator  cuff was __________.  there was a partial-thickness tear involving 20%  of the thickness of the tendon.  We performed a debridement of partial  tear and scuffed up the rotator cuff footprint to encourage healing.  This was limited to the supraspinatus only.  Infraspinatus and teres  minor were normal.  The labrum did have some attritional degenerative  changes around the labrum and also a couple of small areas of  chondromalacia grade II changes present.  Following this we placed the  scope in the subacromial space.  Thorough bursectomy and acromioplasty  was performed, especially paying attention to the anterior lateral  overhang,  and once we had a thorough rotator cuff outlet release, we were able to  inspect the  cuff and make sure that this was intact in the bursal  surface, which it was.  We then concluded surgery, suturing the wounds  with 4-0 Monocryl followed by Steri-Strips and a sterile dressing.  The  patient tolerated the surgery well.      Doran Heater. [name], M.D.  Electronically Signed     SRN/MEDQ  D:  06/28/2007  T:  06/29/2007  Job:  [id number]

## 2010-09-10 NOTE — Assessment & Plan Note (Signed)
[facility]                            CARDIOLOGY OFFICE NOTE   NAME:Stephanie Gross, Stephanie Gross                        MRN:          [id number]  DATE:06/09/2007                            DOB:          [birth year]    PRIMARY CARDIOLOGIST:  Dr. Haroldine Laws.   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  She has a history of coronary artery  disease status post Taxus drug-eluting stent to the RCA in January 2007.  Her last cath was in January 2008 which showed minimal nonobstructive  coronary disease with a patent RCA stent.  However there was a 90%  stenosis in the mid section of the small PDA.  She was treated  medically.  She then had any emergency room visit with chest pain July  2008 in which her enzymes were negative and she thought it was gas  related.  She does have a normal LV function, ejection fraction 65%.   The patient denies any chest pain, palpitations, dyspnea, dyspnea on  exertion, dizziness or presyncope.  She says she does not exercise  outside of walking all day at work.  She needs surgery for a frozen  shoulder and will be undergoing general anesthesia.  The patient has  cardiac risk factors that include insulin dependent diabetes mellitus,  hypertension, hyperlipidemia and she continues to smoke a half-pack of  cigarettes a day   ALLERGIES:  CODEINE.   CURRENT MEDICATIONS:  Metoprolol ER 100 mg daily, Norvasc 5 mg daily,  Plavix 75 mg daily, simvastatin 80 mg daily, metoclopramide 10 mg one-  half t.i.d., enalapril 20 mg daily, aspirin 325 mg daily, Imdur 30 mg  daily, Humalog sliding scale insulin and Lantus 25 units daily.   PAST MEDICAL HISTORY:  Significant for hypertension, diabetes,  hyperlipidemia.  She has been having some back trouble recently.   SOCIAL HISTORY:  She works as a Microbiologist at [employer].  She  smokes half a pack of cigarettes a day and says she is trying to  quit.   REVIEW OF SYSTEMS:  Is negative for dizziness or presyncopal signs or  symptoms, dyspepsia, dysphagia, nausea, vomiting, change in bowels or  melena.  Cardiopulmonary:  Please see HPI.  She is having some back  trouble.   PHYSICAL EXAM:  This is a pleasant 65 year old African American female  in no acute distress.  Blood pressure 132/74, pulse 75, weight 155.  Neck is without JVD, HJR bruit or thyroid enlargement.  LUNGS:  Clear anterior posterior lateral.  HEART:  Regular rate and rhythm at 75 beats per minute.  Normal S1-S2  positive S4 and no significant murmur heard.  Abdomen is soft organomegaly, masses, lesions or abnormal tenderness.  EXTREMITIES:  Without cyanosis or edema.  She has good distal pulses.   EKG normal sinus rhythm.  No acute change.   IMPRESSION:  1. Frozen shoulder, needs surgery, scheduled for February 23 by Dr.      [name].  2. Coronary artery disease status post TAXUS drug-eluting stent to the  RCA in January 2007, cath in January 2008 showed minimal      nonobstructive coronary disease with patent RCA stent, however a      90% stenosis in the mid section of a small PDA, medical treatment      recommended.  3. Normal LV function.  4. Hypertension treated.  5. Hyperlipidemia treated.  6. Insulin dependent diabetes mellitus, well-controlled.  7. Smoker, half pack per day.   PLAN:  At this time the patient has been stable from a cardiac  standpoint.  We will order a stress Cardiolite prior to her surgery and  this is normal,  she should be low risk for undergoing surgery.      Stephanie Barrios, PA-C  Electronically Signed      Stephanie Bjornstad, MD, [facility]  Electronically Signed   ML/MedQ  DD: 06/09/2007  DT: 06/10/2007  Job #: [id number]   cc:   Doran Heater. [name], M.D.  [place]

## 2010-09-13 NOTE — H&P (Signed)
NAMEKRIPA, PULLIUM                 ACCOUNT NO.:  [id number]   MEDICAL RECORD NO.:  [id number]          PATIENT TYPE:  INP   LOCATION:  2027                         FACILITY:  [facility]   PHYSICIAN:  Alexis Goodell. Haithcock, MDDATE OF BIRTH:  08/31/1945   DATE OF ADMISSION:  01/14/2006  DATE OF DISCHARGE:  01/15/2006                                HISTORY & PHYSICAL   CHIEF COMPLAINT:  Chest pain.   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 Coralie Keens of the [facility] who was pushing dietary carts at work when she experienced  6-7/10 chest pain with diaphoresis.  She also experienced palpitations, and  when someone at work checked her blood pressure today, they told her it was  high, although, she does not recall the exact readings.  Her symptoms were  completely relieved by arrival to the emergency room by three sublingual  nitroglycerins administered by [employer].  In the field, she received a  full dose of aspirin.  Currently, she denies chest pain, shortness of  breath.  She has given no history of syncope or near syncope.  She denies  CHF symptoms.  She denies any GERD symptoms and currently her pain is not  reproducible with passive movement of her extremities or torso.  This is the  first episode of chest pain that she has had in several months.   PAST MEDICAL HISTORY:  1. Coronary artery disease catheter April 25, 2005, 80% RCA lesion      treated with TAXUS stent.  2. Hypertension.  3. Type 2 diabetes.  4. Gastroparesis.  5. Hypercholesterolemia.  6. Peripheral neuropathy.  7. Tobacco.  8. Anemia.  9. Left patella fracture.   SOCIAL HISTORY:  Lives in [place].  Occupation:  Counsellor.  A  20-pack year smoking history.  No alcohol or drugs.   ALLERGIES:  CODEINE.   MEDICATIONS:  1. Aspirin.  2. Lipitor 40 mg daily.  3. Enalapril 40 mg daily.  4. Toprol XL 50  mg daily.  5. Lantus 20 mg q.h.s.  6. Neurontin 300 mg b.i.d.  7. Hydrochlorothiazide 12.5 mg daily.  8. Nitroglycerin p.r.n.   REVIEW OF SYSTEMS:  A 13-point review of systems otherwise negative, besides  what is pointed out in History and Physical.   PHYSICAL EXAMINATION:  VITAL SIGNS:  Temperature 98.7, pulse 62,  respirations 18, blood pressure 159/78, pulse 98% on room air.  GENERAL:  She is a very pleasant anxious woman in no apparent distress.  HEENT:  Pupils equal, round and reactive to light.  Extraocular movements  intact.  Posterior oropharynx is clear.  NECK:  Supple.  Jugular venous pulsations are normal with the head of the  bed at 30 degrees.  No lymphadenopathy appreciated.  CARDIOVASCULAR:  Regular S1, S2.  A faint S4 is heard only in the left  lateral decubitus position.  No murmurs are appreciated.  PMI is slightly  laterally deviated.  LUNGS:  Clear to auscultation without adventitial sounds.  SKIN:  No rashes or lesions.  ABDOMEN:  Soft, nontender with positive bowel sounds.  RECTAL:  Brown heme negative stool.  EXTREMITIES:  No cyanosis, clubbing or edema.  MUSCULOSKELETAL:  No deformities.  NEUROLOGICAL:  Alert and oriented x3.  Cranial nerves II-XII grossly intact.  Strength 5/5 in all extremities and all axial groups.  Normal sensation  throughout.  Normal cerebellar function.   STUDIES:  Chest x-ray is pending.  EKG shows normal sinus rhythm at a rate  of 84 beats per minute.  There is nonspecific ST segment changes, likely  associated with repolarization from left ventricular hypertrophy.  Point of  care enzymes were negative.  Labs were otherwise unremarkable.   ASSESSMENT/PLAN:  A 65 year old woman with a history of coronary artery  disease, now presents with chest pain.  Will admit the patient to telemetry  and psychocardiac enzymes x3.  Initial aspirin, beta blocker, ACE inhibitor,  low molecular weight heparin, as well as Statin therapy.  Given her  symptom  onset and her history, will likely consider a nuclear stress test for  further evaluation.      Daniel B. Haithcock, MD  Electronically Signed     DBH/MEDQ  D:  01/14/2006  T:  01/16/2006  Job:  [id number]

## 2010-09-13 NOTE — Assessment & Plan Note (Signed)
[facility]                            CARDIOLOGY OFFICE NOTE   NAME:Gross, [name]                        MRN:          [id number]  DATE:06/25/2006                            DOB:          01-26-1946    CARDIOLOGIST:  Dr. Coralie Keens.   HISTORY OF PRESENT ILLNESS:  Stephanie Gross is a 65 year old female patient  followed by Dr. [name] with a history of coronary disease status post  TAXUS drug-eluting stenting to the RCA in January, 2007, who recently  underwent cardiac catheterization May 22, 2006, that revealed a  patent stent to the RCA, a 40% proximal LAD stenosis, a 20% mid  circumflex stenosis and diffusely diseased posterior descending artery  with up to 90% stenosis in the mid section.  The PDA was a small vessel  and it was decided to treat the patient medically.  She recently saw Dr.  [name] back.  She was noted to have elevated blood pressure and he  placed her on Norvasc 5 mg a day.  She returns today for blood pressure  check.  She actually did not take any of her medications this morning  and she is fasting for blood work.  She says that she recently saw one  of her other physicians and was told that her blood pressure was better  than it has ever been.  She is unsure of what the number was.  She  denies any chest discomfort or shortness of breath.  Denies any syncope.   CURRENT MEDICATIONS:  1. Metformin 500 mg twice daily.  2. Metoprolol ER 100 mg a day.  3. Norvasc 5 mg daily.  4. Enalapril 20 mg daily.  5. Lantus insulin.  6. Aspirin 325 mg daily.  7. Plavix 75 mg daily.  8. Humalog sliding scale.  9. Imdur 30 mg a day.  10.Simvastatin 80 mg daily.  11.Metoclopramide 10 mg 1/2 tablet three times a day.  12.Nitroglycerin p.r.n. chest pain.   ALLERGIES:  CODEINE.   PHYSICAL EXAM:  She is a well-nourished, well-developed female in no  acute distress.  Blood pressure is 154/78.  Repeat blood pressure by me  by manual cuff  148/76 on the right, 144/78 on the left, pulse 60, weight  153 pounds.  HEENT:  Unremarkable.  NECK:  No JVD.  CARDIAC:  Normal S1, S2.  Regular rate and rhythm.  LUNGS:  Clear to auscultation bilaterally without wheezing, rhonchi or  rales.  ABDOMEN:  Soft, nontender.  EXTREMITIES:  Without edema.   IMPRESSION:  1. Hypertension.  2. Coronary artery disease.      a.     Status post TAXUS drug-eluting stenting to the right       coronary artery.      b.     A 90% posterior descending artery stenosis - medical       therapy.  3. Good left ventricular function.  4. Treated dyslipidemia.   PLAN:  The patient presents to the office today for follow up on her  blood pressure.  She actually did not take any of her  medications today.  Repeat blood pressure by me is almost to goal.  I have asked her to  continue taking her medications and to come back in the next 7 days for  blood pressure check with the nurse.  I have asked her to take her  medications before she does that.  As long as her pressure is less than  140/90 will continue with her current medications as listed above.  Since she is fasting today, will go ahead and get lipids and LFTs  checked.  Dr. [name] would like her to follow up with Dr. Haroldine Laws for  further care in the future and will set her up with Dr. Haroldine Laws in the  next 2-3 months.      Richardson Dopp, PA-C  Electronically Signed      Signa Kell, MD, [facility]  Electronically Signed   SW/MedQ  DD: 06/25/2006  DT: 06/25/2006  Job #: [id number]

## 2010-09-13 NOTE — Cardiovascular Report (Signed)
NAME:  LADIAMOND, VEITH                 ACCOUNT NO.:  [id number]   MEDICAL RECORD NO.:  [id number]          PATIENT TYPE:  OIB   LOCATION:  6531                         FACILITY:  [facility]   PHYSICIAN:  Loretha Brasil. Lia Foyer, M.D. [facility] OF BIRTH:  15-Jan-1946   DATE OF PROCEDURE:  04/29/2005  DATE OF DISCHARGE:                              CARDIAC CATHETERIZATION   INDICATIONS:  [name] is a 65 year old who has been admitted with chest  pain. The pain previously had sounded quite ischemic. Diagnostic  catheterization revealed about an 80% mid-right stenosis. She also has  moderate disease of the circumflex, but it does not appear to be critical.  We elected to recommend percutaneous intervention of the right coronary  artery. Because of some borderline anemia, ferritin studies and rectal  examination were obtained. The ferritin was normal. Rectal was heme-  negative. The symptoms today sound more musculoskeletal in that she is  tender on exam in the left costochondral junction, and the pain is worse  with inspiration. Nonetheless, the lesion is really quite worrisome. We  discussed risks, benefits, alternatives with the patient in detail. She has  tolerated Plavix reasonably well. We plan to proceed with percutaneous  intervention. Given her diabetes, drug-eluting technology has been  recommended. In addition, I have discussed with her the need for potentially  long-term Plavix. She believes she can get the medication.   PROCEDURE:  Percutaneous stenting of the right coronary artery.   DESCRIPTION OF PROCEDURE:  The patient was brought to the cath lab and  prepped and draped in the usual fashion. Through an anterior puncture, the  left femoral artery was entered on the second stick. We placed a 6-French  sheath. Bivalirudin was given according to protocol and ACT checked.  Following this, a JR-4 guiding catheter with side holes was utilized. We  crossed with a Prowater wire. Direct stenting  was achieved using a 2.75 x 28  Taxus drug-eluting stent. This was taken to approximately 12 atmospheres. We  then placed a 3 mm Quantum Maverick balloon, and dilatations were done  throughout the course of the stent up to 14 atmospheres. There is marked  improvement in the appearance of the artery. There were no complications.  All catheters were subsequently removed and the femoral sheath was sewn into  place.   ANGIOGRAPHIC DATA:  There was calcification at the ostium the right  coronary. There was about 30-40% plaquing at the ostium of the right. There  was probably 20-40% area of irregularity in the proximal vessel. In the  midvessel, there are two lesions, both which ranged from about 75-80%  luminal reduction. The distal vessel was without critical disease in the  distal portion of the artery but the PDA is segmentally irregular with a  focal area of narrowing of 50-70% in the midportion. Posterolateral branches  without critical narrowing. Following stenting, the mid-right coronary  artery is reduced to 0% residual luminal narrowing with TIMI III flow across  the lesion and excellent angiographic result. There was no evidence of edge  tear.   CONCLUSION:  Successful percutaneous stenting  of the right coronary artery  using a Taxus drug-eluting platform given the patient's underlying diabetes  mellitus.   DISPOSITION:  The patient will follow-up with Dr. [name]. She needs to  improve her hemoglobin A1c which is 12. In addition, the patient also  smokes. Modification of risk factors will be important ongoing issue with  regard to her long-term outcome.      Loretha Brasil. Lia Foyer, M.D. [facility]  Electronically Signed     TDS/MEDQ  D:  04/29/2005  T:  04/29/2005  Job:  [id number]   cc:   Signa Kell, M.D.  [address]  [address]  [place]  [place] [address]   Logan Bores, M.D.  [phone]   CV Laboratory   Patient's medical record

## 2010-09-13 NOTE — H&P (Signed)
Stephanie, Gross                 ACCOUNT NO.:  [id number]   MEDICAL RECORD NO.:  [id number]          PATIENT TYPE:  OIB   LOCATION:  2899                         FACILITY:  [facility]   PHYSICIAN:  Loretha Brasil. Lia Foyer, M.D. [facility] OF BIRTH:  [birth year]   DATE OF ADMISSION:  04/29/2005  DATE OF DISCHARGE:                                HISTORY & PHYSICAL   PRIMARY CARE PHYSICIAN:  Dr. Merton Border   PRIMARY CARDIOLOGIST:  Dr. Coralie Keens   CHIEF COMPLAINT:  Chest pain.   HISTORY OF PRESENT ILLNESS:  [name] is a 65 year old female who was  recently hospitalized for chest pain. She also has a history of diabetes  with a recent hemoglobin A1c being greater than 12. As part of her  evaluation she was catheterized on April 25, 2005, and had an 80% RCA.  She was discharged from the hospital and returns today for percutaneous  intervention.   Since discharge, [name] has not had chest pain until today. Today she  stated she developed chest pain and describes it as a 6/10 if she takes a  very shallow breath, and it goes up to a 10/10 if she takes a deep breath.  The pain is also reproducible by palpating her chest. She states that this  is the pain of which she has been complaining and is the same pain that she  had prior to the cardiac catheterization. She states this is her first  episode since being discharged from the hospital on April 26, 2005. She  states she has been compliant with all of her medications including Plavix  but did not take anything today.   PAST MEDICAL HISTORY:  1.  Diabetes.  2.  Atypical chest pain.  3.  Hypertension.  4.  Gastroparesis.  5.  Hyperlipidemia.  6.  Peripheral neuropathy.  7.  Allergy or intolerance to CODEINE.  8.  Ongoing tobacco use.  9.  History of a left patella fracture.  10. Anemia without iron deficiency.   SURGICAL HISTORY:  She is status post cardiac catheterization as well as  remote cholecystectomy, partial  hysterectomy, left patella repair, and arm  surgery after an injury.   ALLERGIES:  CODEINE.   MEDICATIONS:  Have not changed since discharge from the hospital April 26, 2005, and are:  1.  Aspirin 325 mg a day.  2.  Lipitor 40 mg a day.  3.  Enalapril 40 mg a day.  4.  Toprol-XL 50 mg a day.  5.  Lantus 20 units q.h.s.  6.  Neurontin 300 mg b.i.d.  7.  Hydrochlorothiazide 12.5 mg a day.  8.  Nitroglycerin sublingual p.r.n.  9.  Glucophage 500 mg b.i.d., not taken today.   SOCIAL HISTORY:  She lives in [place] alone. 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 She smokes a half a pack a day with  approximately a 20 pack-year history. She denies alcohol or drug abuse.   FAMILY HISTORY:  Her mother has diabetes and her father has hypertension,  but neither one have heart disease. She  has no siblings with heart disease.   REVIEW OF SYSTEMS:  She denies any recent fevers or chills. Chest pain as  described above. She has some shortness of breath with this secondary to  inability to take a deep breath. She denies any recent palpitations. She  denies any dizziness, syncope, or presyncope. She says she has occasional  melena but has not had any in the last couple of months. She says she is not  constipated and her last bowel movement was yesterday which was normal in  appearance. She denies reflux symptoms, hematemesis, or hemoptysis. She  denies dysuria. Review of systems is otherwise negative.   PHYSICAL EXAMINATION:  VITAL SIGNS:  Temperature is 97.1, blood pressure  101/62, heart rate 71, respiratory rate 18, O2 saturation 99% on room air.  GENERAL:  She is a slender, well-developed, African-American female who is  in obvious distress when she takes a deep breath.  HEENT:  Her head is normocephalic ant atraumatic with pupils equal, round,  and reactive to light and accommodation. Extraocular movements intact.  Sclerae clear. Nares without discharge.  NECK:   There is no JVD, no thyromegaly, and no carotid bruits are  appreciated.  CHEST:  Clear to auscultation bilaterally.  CARDIOVASCULAR:  Her heart is regular in rate and rhythm with an S1 and S2  and a soft systolic murmur best heard at the apex is noted.  ABDOMEN:  Soft and nontender with active bowel sounds.  EXTREMITIES:  There is no cyanosis, clubbing, or edema noted. Her previous  catheterization site is well healed and there is no hematoma, ecchymosis, or  bruit noted.  MUSCULOSKELETAL:  There is no joint deformities or effusion and no spine or  CVA tenderness.  SKIN:  No rashes or lesions are noted.  NEUROLOGIC:  She is alert and oriented. Cranial nerves II-XII are grossly  intact.   EKG with 6/10 chest pain is sinus rhythm, rate 64 beats per minute, with  minimal ST elevation consistent with early repolarization.   IMPRESSION:  1.  Chest pain:  [name] had an 80% right coronary artery on previous      catheterization and is here today for percutaneous intervention. Her      chest pain today, however, is atypical and more likely secondary to      costochondritis or musculoskeletal pain than angina. We will perform      percutaneous intervention and continue her on aspirin and Plavix. Will      treat the costochondritis as well.  2.  Diabetes:  [name] did not take the metformin or glipizide today. She      will be continued on sliding scale insulin. She took her full dose of      Lantus last night. The metformin will be restarted 48 hours post      catheterization and the glipizide will be restarted in the a.m.  3.  Anemia:  [name]'s iron profile was within normal limits. Her B12 was      slightly elevated. Her last      hemoglobin and hematocrit was on April 26, 2005, and was 11.3 with a      hematocrit of 32.1. She was guaiaced today and was guaiac negative. Will     continue to follow her CBC and guaiac all stools. No further inpatient      workup is planned at  this time.      Rosaria Ferries, P.A. [facility]  Loretha Brasil. Lia Foyer, M.D. [facility]  Electronically Signed    RB/MEDQ  D:  04/29/2005  T:  04/29/2005  Job:  [id number]

## 2010-09-13 NOTE — Assessment & Plan Note (Signed)
[facility]                              CARDIOLOGY OFFICE NOTE   NAME:WRIGHT, ALYS HUERTA                        MRN:          [id number]  DATE:01/01/2006                            DOB:          06-Aug-1945    A pleasant 65 year old white female, [employer]  now 8 1/2 months post percutaneous intervention for high grade RCA with a  Taxus stent.  She has a history of diabetes, hypertension, hyperlipidemia  and cigarettes.  She has had anemia in the past.  Currently she is doing  quite well.  She has some mild chest pressure when she takes a deep breath  which is completely relieved when she exhales.  She is working full-time.  On Enalapril 40, Latus insulin 75, aspirin 325, Lipitor 40, Toprol XL 50,  Plavix 75, Humalog sliding scale.  We had ordered blood work last time but  apparently this was not accomplished.   Her blood pressure is 134/81, pulse 68, normal saturation, normal sinus  rhythm.  General appearance is normal.  JVP is not elevated, carotid pulses  are palpable and equal without bruits.  Lungs clear.  Cardiac exam normal.  Abdominal exam normal.  Extremities normal.   IMPRESSION:  Diagnoses as above.  Patient is asymptomatic.  I think the  chest discomfort is probably musculoskeletal.   I suggested she have a BMP, hemoglobin, hematocrit, lipid profile, LFT's.  I  will see her in 3-4 months or prn.  She had another EKG today that is within  normal limits.                              Signa Kell, MD, [facility]    EJL/MedQ  DD:  01/01/2006  DT:  01/01/2006  Job #:  [id number]

## 2010-09-13 NOTE — Discharge Summary (Signed)
[name], [name]                 ACCOUNT NO.:  [id number]   MEDICAL RECORD NO.:  [id number]          PATIENT TYPE:  INP   LOCATION:  2030                         FACILITY:  [facility]   PHYSICIAN:  Marijo Conception. Wall, MD, FACCDATE OF BIRTH:  [birth year]   DATE OF ADMISSION:  05/21/2006  DATE OF DISCHARGE:  05/23/2006                               DISCHARGE SUMMARY   PROCEDURES:  1. Cardiac catheterization.  2. Coronary arteriogram.  3. Left ventriculogram.   PRIMARY DIAGNOSIS:  Acute coronary syndrome.   SECONDARY DIAGNOSES:  1. Allergy or intolerance to codeine.  2. Status post Taxus stent to the RCA in January of 2007.  3. Diabetes.  4. Hyperlipidemia.  5. Gastroesophageal reflux disease.  6. Hypertension.  7. Anemia.  8. Gastroparesis.  9. Cholecystectomy.  10.History of peripheral neuropathy.  11.Status post hysterectomy.  12.Ongoing tobacco use.   TIME AT DISCHARGE:  Thirty-seven minutes.   HOSPITAL COURSE:  [name] is a 65 year old female with a history of  coronary artery disease.  She developed chest pain on the day prior to  admission which was relieved with nitroglycerin.  Her chest pain  recurred, and she came to the hospital for further evaluation and  treatment.   Her cardiac enzymes were negative for MI.  Cardiac catheterization was  performed on May 22, 2006 to assess her anatomy.   Her EF was 65%.  She had a 90% stenosis in the PDA that was a 1 mm  vessel.  She had no in-stent restenosis in the RCA and a 20% stenosis in  the circumflex with a 40% stenosis in the LAD.  Dr. Albertine Patricia evaluated the  films and felt that her angina might be due to progression of the  stenosis in the small PDA.  She is to be managed medically.  Her beta  blocker was increased and Imdur was added.   On June 23, 2006, she was ambulating without chest pain or shortness  of breath.  She was tolerating Imdur well.  She was evaluated by Dr.  Verl Blalock and considered stable for  discharge with outpatient follow-up  arranged.   DISCHARGE INSTRUCTIONS:  Her activity level is to be increased  gradually.  She is to call our office for any problems with the cath  site.  She is to follow up with Dr. [name] on June 09, 2006 at  3:45.  She is to follow up with the [facility] as  needed or as scheduled.   DISCHARGE MEDICATIONS:  1. Toprol XL 100 mg a day.  2. Imdur 30 mg a day.  3. Enalapril 40 mg a day.  4. Aspirin 325 mg a day.  5. Humalog and Lantus as prior to admission.  6. Plavix 75 mg a day.  7. Simvastatin 80 mg daily.  8. Nitroglycerin sublingual p.r.n.      Rosaria Ferries, PA-C      Marijo Conception. Verl Blalock, MD, [facility]  Electronically Signed    RB/MEDQ  D:  06/29/2006  T:  06/29/2006  Job:  [id number]   cc:   [facility]

## 2010-09-13 NOTE — Assessment & Plan Note (Signed)
[facility]                            CARDIOLOGY OFFICE NOTE   NAME:Stephanie Gross, Stephanie Gross                        MRN:          [id number]  DATE:06/09/2006                            DOB:          1945/10/02    The patient is a very pleasant 65 year old African American female,  [employer] employee, with coronary artery disease, status post  Taxus drug-eluting stent to the right coronary artery in January 2007.  She was admitted on May 21, 2006, with chest pain.  Enzymes were  negative.  A cath performed by Dr. Albertine Patricia revealed a patent stent,  however, progression of disease in the posterior descending branch of  the RCA.  It was felt that she should be continued on medical therapy.   The patient has had no recurrent symptoms and has returned to work.   MEDICATIONS:  1. Enalapril 40.  2. Lantus insulin 75 units p.m.  3. Aspirin 325.  4. Lipitor 40.  5. Toprol XL 50.  6. Plavix 75.  7. Humalog sliding scale.  8. Imdur 30.   PHYSICAL EXAMINATION:  Blood pressure 158/74, pulse 59, normal sinus  rhythm.  GENERAL APPEARANCE:  Normal.  JVP is not elevated.  LUNGS:  Clear.  CARDIAC:  Normal.  ABDOMEN:  Normal.  EXTREMITIES:  Normal.   EKG reveals normal sinus rhythm, 64, minimal voltage criteria for LVH.  No acute change.   IMPRESSION/DIAGNOSIS:  1. Hypertension, poorly controlled.  2. Coronary artery disease, 1 year status post Taxus stent in the      right coronary artery with continued patency; however, with      progressive disease in the posterior descending coronary artery.  3. Hyperlipidemia.   I have suggested the patient continue the same therapy.  Because of the  hypertension plan to add Norvasc 5 mg.  I also plan to check a lipid and  LFTs, and she should have her blood pressure followed by our PA in 2 to  3 weeks, and I will have her see Dr. Haroldine Laws in followup  for continued care.  If her pressures are not well controlled  or any  symptoms I will plan to see her prior to that time.     Signa Kell, MD, [facility]  Electronically Signed    EJL/MedQ  DD: 06/09/2006  DT: 06/10/2006  Job #: [id number]

## 2010-09-13 NOTE — Op Note (Signed)
NAME:  Stephanie Gross, Stephanie Gross                           ACCOUNT NO.:  [id number]   MEDICAL RECORD NO.:  [id number]                   PATIENT TYPE:  UT   LOCATION:                                       FACILITY:  [facility]   PHYSICIAN:  Starling Manns, M.D.                  DATE OF BIRTH:  December 14, 1945   DATE OF PROCEDURE:  11/30/2001  DATE OF DISCHARGE:  12/03/2001                                 OPERATIVE REPORT   PREOPERATIVE DIAGNOSES:  Comminuted, displaced inferior pole, left patellar  fracture.   POSTOPERATIVE DIAGNOSES:  Comminuted, displaced inferior pole, left patellar  fracture.   PROCEDURE:  Left knee inferior pole patellectomy with patellar tendon  repair.   SURGEON:  Starling Manns, M.D.   ASSISTANT:  Alexzandrew L. Dara Lords, P.A.   ANESTHESIA:  General endotracheal.   COMPLICATIONS:  None.   INDICATIONS FOR PROCEDURE:  The patient is a 65 year old female who fell  last night and presented to the [facility] emergency department.  X-rays  revealed a displaced, comminuted inferior pole patellar fracture.  The  patient was in extreme pain and unable to extend her knee and was,  therefore, admitted to the hospital.  The risks, benefits, and alternatives  of ORIF, left patella, versus inferior pole patellectomy were discussed with  the patient extensively, and she elected to proceed.   PROCEDURE IN DETAIL:  The patient was properly identified in the holding  area.  She was taken to the operating room.  She underwent general  endotracheal anesthesia without difficulty.  She was given prophylactic IV  antibiotics.  All bony prominences were padded.  The left knee was prepped  and draped in the usual sterile fashion.  A tourniquet was placed on the  upper thigh.  A 7 cm incision was made, centered over the patella.  This was  carried down to the patellar retinaculum.  We immediately encountered a  large hematoma.  The fracture fragments were easily identified and the knee  joint widely  exposed through the fracture fragments.  The inferior portion  of the fracture was extremely comminuted, and it quickly became obvious that  there would be no way to perform an open reduction, internal fixation of the  small fragments.  Therefore, we proceeded with inferior pole patellectomy  with repair of patellar tendon.  All of the clot was washed out with copious  irrigated.  We then examined the knee joint itself.  The articular surfaces  appeared to be intact, and removed all of the loose bodies and pieces of  bone.  The remaining small pieces of bone attached to the patellar tendon  insertion were dissected free of the tendon sharply.  The tendon was  exposed.  We then placed two Krakow sutures in the tendon using #2 fiber  wire.  We then drilled three holes in the proximal fragment, staying  as  anterior as possible.  We then passed the four suture strands through the  three holes and tied them tightly, reapproximating the tendon to the  proximal fracture fragment.  We tested this repair with flexion and  extension, and there was no gapping.  The extensor mechanism was intact.  We  then identified the retinacular tears medially and laterally and using #1  Vicryl repaired these all the way, starting posteriorly to the anterior  portion where the retinaculum comes into the patella.  The wound was  copiously irrigated.  We closed the subcutaneous layers with 2-0 Vicryl,  followed by skin staples on the skin.  It should be noted that the  tourniquet was inflated at the  initiation of the procedure.  Total tourniquet time was 44 minutes.  A  sterile dressing was applied.  The patient was placed into a knee  immobilizer and extubated without difficulty.  She was transferred to the  recovery room in stable condition.                                               Starling Manns, M.D.    MWC/MEDQ  D:  11/30/2001  T:  12/04/2001  Job:  [id number]

## 2010-09-13 NOTE — Discharge Summary (Signed)
NAMESAMSARA, TEDESCO                 ACCOUNT NO.:  [id number]   MEDICAL RECORD NO.:  [id number]          PATIENT TYPE:  INP   LOCATION:  2027                         FACILITY:  [facility]   PHYSICIAN:  Marijo Conception. Wall, MD, FACCDATE OF BIRTH:  [birth year]   DATE OF ADMISSION:  01/14/2006  DATE OF DISCHARGE:  01/15/2006                           DISCHARGE SUMMARY - REFERRING   DISCHARGE DIAGNOSES:  1. Chest discomfort of uncertain etiology.  2. Hyperglycemia with history of diabetes.  3. Hyperlipidemia appears to be controlled.  4. Tobacco use.  5. History as noted above.   SUMMARY OF HISTORY:  [name] is a 65 year old African American female who  presented to [facility] with chest discomfort.  She stated  that while at work at [employer] and [employer], she  developed 6-7/0-10 chest discomfort associated with diaphoresis.  With 3  sublingual nitroglycerins, her discomfort was completely resolved.  This is  the first episode of chest discomfort that she has had in several years.   Her history is notable for tobacco use, peripheral neuropathy, diabetes,  hyperlipidemia, gastroparesis, old inferior myocardial infarction,  hypertension, last cath December 2006, which showed an 80% RCA and she  received a Taxus stent, anemia with negative stools in the past.   LABORATORY DATA:  Admission H&H are 11.5 and 34.0, normal indices, platelets  238,000, WBC 8.6; PTT 34.  Admission sodium 138, potassium 3.9, BUN 19,  creatinine 0.8, glucose 141.  Subsequent chemistry was unremarkable, except  for sugar of 166 and normal LFTs.  CK-MBs and troponins were within normal  limits x2.  Fasting lipids showed a total cholesterol of 116, triglycerides  136, HDL 41, LDL 48.   EKGs showed normal sinus rhythm, normal axis, nonspecific ST-T wave changes.   Chest x-ray:  This showed no active disease.   HOSPITAL COURSE:  [name] was admitted to [facility], unit  2000,  and placed on Lovenox and on her home medications.  Overnight, she did not  have any further chest discomfort.  One point-of-care markers and 2 CK-MBs  and troponins were within normal limits.  She had not had any further chest  discomfort or EKG changes.  It was felt that she could be discharged home  with outpatient followup.   PROCEDURES:  None.   DISPOSITION:  [name] is discharged home, asked to continue her low-salt/-  fat-cholesterol ADA diet.  She was asked to bring all medications to all  appointments.   MEDICATIONS:  1. Plavix 75 mg daily.  2. Toprol-XL 50 mg daily.  3. Enalapril 20 mg daily.  4. Lipitor 40 mg nightly.  5. Lantus 25 units nightly.  6. Metformin unknown dosage daily.  7. Humalog sliding scale.  8. Nitroglycerin 0.4 as needed.   DISCHARGE INSTRUCTIONS:  She was advised no smoking or tobacco products.  She will have a stress Myoview on Monday at January 19, 2006 at 10 a.m.  She was asked to remain n.p.o. post midnight on Sunday and she was  instructed not to take her Toprol on the morning  of her test.   FOLLOWUP:  She will follow up with Dr. Coralie Keens in the office on January 27, 2006 at 4 p.m.   DISCHARGE TIME:  Less than 30 minutes.     ______________________________  Sharyl Nimrod, PA-C      Marijo Conception Verl Blalock, MD, [facility]  Electronically Signed    EW/MEDQ  D:  01/15/2006  T:  01/16/2006  Job:  [id number]   cc:   Logan Bores, M.D.  Signa Kell, MD, [facility]

## 2010-09-13 NOTE — Discharge Summary (Signed)
NAME:  Stephanie Gross, Stephanie Gross                 ACCOUNT NO.:  [id number]   MEDICAL RECORD NO.:  [id number]          PATIENT TYPE:  OIB   LOCATION:  [id number]                         FACILITY:  [facility]   PHYSICIAN:  Signa Kell, M.D.DATE OF BIRTH:  02/06/1946   DATE OF ADMISSION:  04/29/2005  DATE OF DISCHARGE:  04/30/2005                                 DISCHARGE SUMMARY   PRIMARY DIAGNOSIS:  Unstable anginal pain, status post percutaneous coronary  angioplasty and Taxus stent to the right coronary artery.   SECONDARY DIAGNOSES:  1.  Atypical chest pain, worse with deep inspiration and chest wall      palpation, controlled well by Motrin.  2.  Hypertension.  3.  Diabetes.  4.  History of gastroparesis.  5.  Hyperlipidemia.  6.  Peripheral neuropathy.  7.  Tobacco use.  8.  Anemia with black negative stool x1, and normal iron level.  9.  History of left patellar fracture and repair.  10. Cholecystectomy and partial hysterectomy, as well as arm surgery.   ALLERGIES:  CODEINE.   PROCEDURES:  1.  Cardiac catheterization.  2.  Coronary arteriogram.  3.  PTCA and Taxus stent to one vessel.   HOSPITAL COURSE:  Stephanie Gross is a 65 year old female with no previous  history of coronary artery disease.  She was in the hospital from April 19, 2005 to April 20, 2005 for chest pain, as well as poorly controlled  diabetes, and hypertension.  She was set up to follow-up at [facility] as an outpatient as was seen there on April 24, 2005.  She was  having ongoing chest pain, and was admitted on April 25, 2005 for cardiac  catheterization.  The cardiac catheterization showed 80% stenosis in the mid  RCA, then 70%, then in the PDA there was an 80% stenosis.  She was  discharged on April 26, 2005, and was readmitted on April 29, 2005 for  percutaneous intervention.   On admission, she complained of chest wall pain and her chest wall was  tender to palpation.  Her EKG had no acute  changes.  She had the  percutaneous intervention to the RCA with the Taxus stent reducing the  stenosis from 80% to zero, and was started on Motrin as well.   During her previous admission, she was noted to be anemic with a hemoglobin  of 10.7, and a hematocrit of 30.9.  Iron studies were performed, which were  within normal limits except for a B12 level that was elevated at 1201, and  the UIBC level that was minimally low at 18.  She was guaiaced this  admission and was guaiac negative.   REVIEW OF SYSTEMS:  She did complain of occasional melanotic stools, but had  not had any within the last two months.  She is advised to follow-up with  Dr. Cherrie Gauze for this, and to Dr. Cherrie Gauze immediately if she has any further  melena.   Stephanie Gross tolerated the procedure well and seen by [facility] during  her stay.  She was encouraged to quit tobacco, and  stated that she planned  to quit.  She had some groin oozing post catheterization, and this was  treated with a Lidocaine/Epinephrine injection and resolved.   On April 30, 2005, Stephanie Gross was ambulating without chest pain or  shortness of breath and her groin remained stable.  She had no significant  ecchymosis, hematoma, or bruit, and was considered stable for discharge on  April 30, 2005 with outpatient follow-up arranged.   LABORATORY VALUES:  Hemoglobin 10.6, hematocrit 30.6, WBCs 9.5, platelets  236.  Sodium 137, potassium 3.7, chloride 108, CO2 24, BUN 16, creatinine  0.8, glucose 202, iron 53, TIBC 289, 18% saturation, UIBC 236, vitamin B12  1201, ferritin 175.   DISCHARGE INSTRUCTIONS:  1.  Her activity is to be increased slowly, and she is to stick to a low-fat      diabetic diet.  2.  She may return to work on May 12, 2005, and is to call our office      for any problems with cath site.   DISCHARGE MEDICATIONS:  1.  Plavix 75 mg q. day for at least one year.  2.  Motrin 200 mg t.i.d. for a week and then t.i.d.  p.r.n.  3.  Toprol XL 50 mg a day.  4.  Lipitor 40 mg a day.  5.  Enalapril 40 mg a day.  6.  HCTZ 12.5 mg a day.  7.  Neurontin 300 mg b.i.d.  8.  Aspirin 325 mg a day.  9.  Metformin 1000 mg b.i.d., to restart May 02, 2005.  10. Glipizide 10 mg b.i.d.  11. Nitroglycerin sublingual p.r.n.      Rosaria Ferries, P.A. [facility]    ______________________________  E. Raymond Gurney, M.D.    RB/MEDQ  D:  04/30/2005  T:  04/30/2005  Job:  [id number]   cc:   Logan Bores, M.D.  [phone]   E. Raymond Gurney, M.D.  [address]  [place] [address]

## 2010-09-13 NOTE — Cardiovascular Report (Signed)
NAMEPEGGYANN, LOCH                 ACCOUNT NO.:  [id number]   MEDICAL RECORD NO.:  [id number]          PATIENT TYPE:  INP   LOCATION:  2919                         FACILITY:  [facility]   PHYSICIAN:  Ethelle Lyon, MD  DATE OF BIRTH:  1945-05-01   DATE OF PROCEDURE:  05/22/2006  DATE OF DISCHARGE:                            CARDIAC CATHETERIZATION   PROCEDURES:  1. Left heart catheterization.  2. Left ventriculography.  3. Coronary angiography.  4. Star Close closure of the right common femoral arteriotomy site.   INDICATIONS:  [name] is a 65 year old woman with diabetes mellitus,  hypertension, and hypercholesterolemia.  She has coronary artery  disease, for which she is status post stenting of the mid-right coronary  artery in January 2007.  She now presents with chest pain occurring at  rest, reminiscent of her prior angina.  She is referred for diagnostic  angiography after ruling out for myocardial infarction.   PROCEDURAL TECHNIQUE:  Informed consent was obtained.  Under 1%  lidocaine local anesthesia, a 6-French sheath was placed in the right  common femoral artery using the modified Seldinger technique.  Diagnostic angiography and ventriculography were performed using JL-4,  JR-4, and pigtail catheters.  The arteriotomy was then closed using a  Star Close device.  Complete hemostasis obtained.  She was then  transferred to the holding room in stable condition.   COMPLICATIONS:  None.   FINDINGS:  1. LV:  146/4/14.  EF 65% without regional wall motion abnormality.  2. No aortic stenosis or mitral regurgitation.  3. Left main:  Angiographically normal.  4. LAD:  Large vessel which wraps around the apex of the heart to      supply the distal inferior wall.  It gives rise to one small      diagonal.  There is a 40% stenosis in the proximal vessel overlying      the origin of the largest septal perforator.  5. Ramus intermedius:  Moderate-sized vessel which is  angiographically      normal.  6. Circumflex:  Moderate-sized vessel giving rise to a single      marginal.  There is a 20% stenosis of the midvessel.  7. RCA:  Moderate-sized, dominant vessel.  There is a previously-      placed stent in the midvessel, which is widely patent with no in-      stent restenosis.  The posterior descending artery is small and      diffusely diseased with up to a 90% stenosis in its midsection.      Vessel is approximately 1 mm in diameter even after the      administration of intracoronary nitroglycerin.   IMPRESSION/RECOMMENDATIONS:  Preserved left ventricular systolic  function and normal left ventricular end-diastolic pressure.  The  previously-placed stent in the right coronary is normal.  However, there  is progression of disease in the very small posterior descending artery.  We will manage this medically with increase in her beta blockade and  addition of Imdur.      Ethelle Lyon, MD  Electronically Signed  WED/MEDQ  D:  05/22/2006  T:  05/22/2006  Job:  [id number]   cc:   [facility]  Signa Kell, MD, [facility]

## 2010-09-13 NOTE — H&P (Signed)
NAME:  Stephanie Gross, Stephanie Gross                           ACCOUNT NO.:  [id number]   MEDICAL RECORD NO.:  [id number]                   PATIENT TYPE:  INP   LOCATION:  5033                                 FACILITY:  [facility]   PHYSICIAN:  Karenann Cai, P.A.                 DATE OF BIRTH:  05/30/1945   DATE OF ADMISSION:  11/29/2001  DATE OF DISCHARGE:  12/03/2001                                HISTORY & PHYSICAL   CHIEF COMPLAINT:  Left knee pain.   HISTORY OF PRESENT ILLNESS:  This is a 65 year old black female who was  admitted overnight for left knee pain.  She was at [employer] yesterday.  She  fell forward, landing on her left knee.  Since that time, she has been  unable to extend the knee.  She has been having excruciating anterior left  knee pain and has been unable to bear weight.  She was brought to [facility]  Emergency Department and found to have a left comminuted patella fracture by  the ER physicians.  Dr. Rennis Harding was consulted for orthopedic evaluation.  The patient was admitted to Dr. Rolinda Roan service.   PAST MEDICAL HISTORY:  Diabetes mellitus, type 2, and hypertension.   PAST SURGICAL HISTORY:  1. Cholecystectomy 20 years ago.  2. Breast surgery, which was benign, details unknown.  3. Partial hysterectomy.  4. Arm surgery for tendon repairs following an injury.   ALLERGIES:  CODEINE causes nausea and vomiting.   MEDICATIONS:  1. Glucotrol 10 mg p.o. q.d.  2. Lotensin 10 mg p.o. q.d.   SOCIAL HISTORY:  The patient smokes 1/2 pack of cigarettes per day.  She  denies alcohol use.  She is single.  She lives with a 4 year old grandson,  as well as a friend.  She does have four grown sons who live local and will  be available to help her through her postoperative course.   REVIEW OF SYSTEMS:  The patient denies any fevers, chills, sweats, or  bleeding tendencies.  CNS:  Denies any blurred vision, double vision,  seizures, headache, paralysis.  CARDIOVASCULAR:  Denies  any chest pain,  angina, orthopnea, claudication, or palpitation.  PULMONARY:  Denies  shortness of breath, productive cough or hemoptysis.  GI:  Denies nausea,  vomiting, constipation, diarrhea, melena, or bloody stools.  GU:  Denies  dysuria, hematuria, or discharge.  MUSCULOSKELETAL:  Denies any  paresthesias, numbness, or paralysis.  Her knee is in a locked flexed  position.   PHYSICAL EXAMINATION:  GENERAL:  The patient is a 65 year old black female who is alert and  oriented in no acute distress.  She is well nourished, well groomed, and  appears her stated age.  She is pleasant and cooperative to exam.  She is in  obvious discomfort lying in the bed.   HEENT:  Head is normocephalic, atraumatic.  Pupils are equal, round, and  reactive.  Extraocular movements intact.  Nares are patent.  Pharynx is  clear.   NECK:  Supple to palpation.  No bruits.  She had no lymphadenopathy or  thyromegaly noted.   HEART:  Regular rate and rhythm.  No murmurs, gallops, or rubs noted.  S1,  S2.   CHEST:  Clear to auscultation bilaterally.  No rales, rhonchi, stridor,  wheezes, friction rubs.   ABDOMEN:  Positive bowel sounds, soft to palpation, nontender, nondistended,  no organomegaly noted.   EXTREMITIES:  Bilateral upper extremities are pain free.  Right lower  extremity is pain free.  Left lower extremity is in a flexed position.  She  is unable to extend either actively or passively.  She is complaining of  severe pain in the anterior knee.  This is consistent with very careful  palpation.  Positive posterior tibialis and dorsalis pedis pulses, intact  and equal bilaterally.  Sensation is intact to light touch.  Motor function  is intact distally to the toes.  Gentle range of motion of the hip and  palpation of the hip appears to be pain free.   LABORATORY DATA:  X-rays show a comminuted left patella fracture.   ASSESSMENT:  A 65 year old black female with a left patella fracture.   She  is also a type 2 diabetic and has a history of hypertension.   PLAN:  The patient is n.p.o.  We are obtaining a consent for surgery.  She  is on strict bedrest at this point.  We will hold her Glucotrol while she is  n.p.o.  She was placed on sliding scale insulin. She will be going to the  operating room later today with Dr. Rennis Harding for an open reduction and  internal fixation of the left patella fracture.                                               Karenann Cai, P.A.    CM/MEDQ  D:  11/30/2001  T:  12/04/2001  Job:  [id number]

## 2010-09-13 NOTE — H&P (Signed)
NAMEBEKI, GREASON                 ACCOUNT NO.:  [id number]   MEDICAL RECORD NO.:  [id number]          PATIENT TYPE:  INP   LOCATION:  3707                         FACILITY:  [facility]   PHYSICIAN:  Shaune Pascal. Bensimhon, MDDATE OF BIRTH:  22-Apr-1946   DATE OF ADMISSION:  05/21/2006  DATE OF DISCHARGE:                              HISTORY & PHYSICAL   PRIMARY CARDIOLOGIST:  Dr. Coralie Keens.   PRIMARY CARE:  The patient is followed by the [facility].   [name] is a very pleasant 65 year old African American female with  known history of coronary artery disease status post Taxus drug-eluting  stent to the right coronary artery in January of 2007.  States she has  been doing well since that time I saw Dr. Wille Glaser at the end of 2007.  No  complaints at that time.  She also has a history of diabetes previously  poorly controlled with a hemoglobin A1c of 12 in early 2007.  She states  her CBGs have been running between 87 and 112 in the mornings recently;  however, has ongoing history of tobacco use.  States she is down to five  cigarettes a day.  This episode of chest discomfort began yesterday.  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  She was  pushing a cart down the home around 6:30 p.m.  She developed sudden  onset of substernal tightness, also some tenderness above her left  breast.  She took a nitroglycerin.  Chest pain eventually resolved  within the hour.  She went home, states she just felt tired, slept okay  last night.  This morning woke up, felt slightly dizzy.  The sternal  chest discomfort returned.  She states it initially felt like it was  worse with movement; however, the chest discomfort around her left  breast has continued, almost like a soreness at times.  She had  associates shortness of breath with the discomfort.  She states she has  not felt good all day, just felt very drained and fatigued.  She has  been sleeping more.  Denies any  fever at home; however, her temperature  here is 101 degrees.  She states 2 weeks ago she felt like she had an  upper respiratory infection.  She was coughing and she said the lymph  nodes in her neck were swollen since that time, though she has not had  any problems.   ALLERGIES:  Include CODEINE.   MEDICATIONS:  1. Include Lipitor 40.  2. Plavix 75.  3. Toprol XL 50.  4. Enalapril 40.  5. Aspirin 81.  6. She is also on Humalog and Lantus insulin.   PAST MEDICAL HISTORY:  1. Includes coronary artery disease, status post cath with drug-      eluting stent to the right coronary artery 2007.  2. Diabetes previously poorly controlled.  3. Hyperlipidemia.  4. GERD.  5. Hypertension.  6. Anemia.  7. Gastroparesis, status post cholecystectomy.  8. Also a history of peripheral neuropathy.  9. Last stress test was done in 2007.  The patient states she was told      everything was okay then.   SOCIAL HISTORY:  She lives in [place] alone.  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  She states she is down to  five cigarettes a day.  No routine exercise.  Denies any EtOH, drugs or  herbal medication.  She tries to follow ADA diet.   FAMILY HISTORY:  Positive for diabetes, hypertension.  No known coronary  artery disease in her siblings.   REVIEW OF SYSTEMS:  Positive for chest pain, shortness of breath  associated with chest discomfort, increased weakness and fatigue, some  numbness in her feet which she states is chronic most likely from her  diabetes.  Temperature 101.1, pulse 73, respirations 14, blood pressure  186/82.  The patient is sating 98% on room air.  GENERAL:  She is in no acute distress.  Normocephalic, atraumatic.  Pupils equal, round and reactive light.  Sclerae is clear.  She has her  own teeth.  No JVD.  No lymphadenopathy noted.  CARDIOVASCULAR SYSTEM:  Regular rate and rhythm.  S1-S2.  LUNGS:  Clear to auscultation.  SKIN:  Is warm and dry.   ABDOMEN:  Soft, nontender, positive bowel sounds.  LOWER EXTREMITIES:  Without clubbing, cyanosis or edema.  NEUROLOGIC:  The patient alert and oriented x3.  Cranial nerves II-XII  grossly intact.   Chest x-ray showing no acute findings.  EKG sinus rhythm.  Lab work at  this time, D-dimer less than 0.22.  Troponin less than 0.05 by point of  care.  H&H 11.9 and 36.  WBCs 8.5, platelets 220,000.  Creatinine 0.7.  Dr. Shaune Pascal. Bensimhon in to examine the patient with episodes of chest  discomfort.  EKG showed no acute changes.  First set of cardiac markers  negative.  Temperature 101, questionable etiology.  WBCs are within  normal limits and the patient does not feel febrile.  Plan on cardiac  catheterization in the morning.  Continue home medications and start  heparin.      Rosanne Sack, ACNP      Shaune Pascal. Bensimhon, MD  Electronically Signed    MB/MEDQ  D:  05/21/2006  T:  05/22/2006  Job:  [id number]

## 2010-09-13 NOTE — Discharge Summary (Signed)
NAMENANNA, JOURNIGAN                 ACCOUNT NO.:  [id number]   MEDICAL RECORD NO.:  [id number]           PATIENT TYPE:   LOCATION:  [id number]                         FACILITY:  [facility]   PHYSICIAN:  Jacqulyn Ducking, M.D. LHCDATE OF BIRTH:  01-28-1946   DATE OF ADMISSION:  04/25/2005  DATE OF DISCHARGE:  04/26/2005                                 DISCHARGE SUMMARY   PRINCIPAL DIAGNOSIS:  Coronary artery disease.   OTHER DIAGNOSES:  1.  Hypertension.  2.  Hyperlipidemia.  3.  Type 2 diabetes mellitus.  4.  Peripheral neuropathy.  5.  Gastroparesis.  6.  History of left knee replacement.  7.  Status post cholecystectomy.  8.  Status post hysterectomy.  9.  Ongoing tobacco abuse.   ALLERGIES:  No known drug allergies.   PROCEDURES:  Left heart cardiac catheterization.   HISTORY OF PRESENT ILLNESS:  This is a 65 year old, African-American female,  who recently had the onset of severe left precordial chest pain radiating to  her left arm prompting her to present to the ED on December 24th.  At that  time, point-of-care markers were negative, and she was discharged from the  ED and followed up with Dr. [name] at the [facility]iology Clinic on April 24, 2005.  Secondary to persistent chest pain, the decision was made to admit  her from the office on December 28th.   HOSPITAL COURSE:  The patient ruled out for MI and underwent left heart  cardiac catheterization on December 29th revealing nonobstructive disease in  the left coronary tree, with an 80% stenosis of a mid RCA followed by a 70%  stenosis slightly more distally, and an 80% stenosis in the PDA.  LV  function was normal.  Decision was made to admit the patient for evaluation  of anemia and diabetes.  She underwent iron studies revealing a serum iron  of 53, ferritin of 176, TIBC of 289, and a B12 of 1201.  Her hemoglobin A1c  was 12.8.  This morning, she has not had any chest pain, and decision has  been made to discharge her  today, and she will return Tuesday morning for  PCI of the RCA.  We have discussed her poor control of diabetes with her and  have stressed the importance of followup.  We will increase her Lantus from  10 units q.h.s. to 20 units q.h.s. and I have advised close blood sugar  monitoring at home.  If she is to experience any recurrent chest discomfort  or use of nitroglycerin over the next three days, she is advised to call [phone]  and come back for admission.   DISCHARGE LABORATORY DATA:  Hemoglobin 11.3, hematocrit 32.1, WBC 10.0,  platelets 231.  Sodium 137, potassium 3.9, chloride 105, CO2 27, BUN 15,  creatinine 0.7, glucose 277, calcium 9.4.  Hemoglobin A1c 12.8.  Serum iron  53, ferritin 176, TIBC 289, B12 1201.   DISPOSITION:  Patient is being discharged home today in good condition.   FOLLOWUP PLANS AND APPOINTMENTS:  The patient is asked to follow up with Dr.  Cherrie Gauze at  the [facility] within the next one to two weeks.  She will follow up with Dr. Raymond Gurney or his nurse practitioner/PA in  approximately two weeks and finally, will return to [facility] for PCI of  the RCA on Tuesday, January 2nd.   DISCHARGE MEDICATIONS:  1.  Aspirin 325 mg daily.  2.  Lipitor 40 mg daily.  3.  Enalapril 40 mg daily.  4.  Toprol-XL 50 mg daily.  5.  Lantus 20 units q.h.s.  6.  Neurontin 300 mg b.i.d.  7.  HCTZ 12.5 mg daily.  8.  Nitroglycerin 0.4 mg sublingual p.r.n. chest pain.  9.  Glucophage as previously prescribed to be resumed May 01, 2005.   OUTSTANDING LABORATORY STUDIES:  None.   DURATION OF DISCHARGE ENCOUNTER:  40 minutes including physician time.      Rogelia Mire, NP    ______________________________  Jacqulyn Ducking, M.D. [facility]    CRB/MEDQ  D:  04/26/2005  T:  04/27/2005  Job:  [id number]   cc:   Logan Bores, M.D.  [phone]   E. Raymond Gurney, M.D.  [address]  [place] [address]

## 2010-09-13 NOTE — Assessment & Plan Note (Signed)
[facility]                            CARDIOLOGY OFFICE NOTE   NAME:Gross, Stephanie WAHLEN                        MRN:          [id number]  DATE:06/09/2006                            DOB:          [birth year]    Very pleasant 65 year old African American female, [employer]  employee, with known coronary disease, Taxus drug-eluting stent placed  in the right coronary artery on April 29, 2005, by Dr. Albertine Patricia.  The  patient was recently admitted with exertional chest pain on May 21, 2006.  She underwent coronary angiography by Dr. Albertine Patricia which revealed  patent stent but some progression of disease in the posterior descending  branch of the RCA.  It was felt that medical therapy was indicated.   The patient was started on Imdur.  She has had no recurrent symptoms.     Signa Kell, MD, [facility]  Electronically Signed    EJL/MedQ  DD: 06/09/2006  DT: 06/10/2006  Job #: [id number]

## 2010-09-13 NOTE — Cardiovascular Report (Signed)
NAMEVAYA, GHERARDI                 ACCOUNT NO.:  [id number]   MEDICAL RECORD NO.:  [id number]          PATIENT TYPE:  OIB   LOCATION:  6522                         FACILITY:  [facility]   PHYSICIAN:  Loretha Brasil. Lia Foyer, M.D. [facility] BIRTH:  1945-10-17   DATE OF PROCEDURE:  04/25/2005  DATE OF DISCHARGE:                              CARDIAC CATHETERIZATION   INDICATIONS:  The patient is a 65 year old with recurrent chest pain. She  also has an unexplained anemia. Current study was done to assess coronary  anatomy. She has diabetes mellitus.   PROCEDURES:  1.  Left heart catheterization.  2.  Selective coronary arteriography.  3.  Selective left ventriculography.   DESCRIPTION OF PROCEDURE:  The procedure was performed from the right  femoral artery using 6-French catheters. She tolerated procedure without  complication. She was taken to the holding area in satisfactory clinical  condition.   HEMODYNAMIC DATA:  1.  Central a pressure 146/73, mean 101.  2.  Left ventricular pressure 110/5.  3.  No gradient on pullback across the aortic valve.   ANGIOGRAPHIC DATA:  1.  The left main coronary was large and free of critical disease.  2.  The LAD courses to the apex. There are multiple areas of mild luminal      irregularity proximally. There is segmental disease about 40% followed      by a 40-50% area of irregularity in the midvessel. In the mid to distal      vessel, there was another area of probable 40% segmental narrowing and      irregularity also distally.  3.  There is a ramus intermedius that has mild luminal irregularity but      without critical stenosis.  4.  The circumflex trifurcates distally into two marginal branches and AV      circumflex. Just prior to the trifurcation there is about 60% area of      focal narrowing.  5.  The right coronary demonstrates about 30-40% narrowing at the ostium      with calcification. There is mid-stenosis of about 80% followed by a     second stenosis 70-80%. There is also stenosis in the midportion of      about 80%. Posterolateral segment is without critical narrowing.   Ventriculography in the RAO projection reveals preserved global systolic  function. No segmental abnormalities are identified.   CONCLUSION:  1.  Moderately high-grade disease involving the mid right coronary artery      with concomitant ostial involvement.  2.  Moderate stenosis of the mid-circumflex.  3.  Scattered lesions of the left anterior descending artery.  4.  Well-preserved left ventricular function.   PLAN:  The patient was taken off the table. We need to do an anemia workup.  She is diabetic and would benefit mostly from drug-eluting technology.  However, with the anemia we need to make sure that she is not iron-  deficient. We will tentatively put her on the schedule for Tuesday.      Loretha Brasil. Lia Foyer, M.D. [facility]  Electronically Signed  TDS/MEDQ  D:  04/25/2005  T:  04/26/2005  Job:  [id number]   cc:   Signa Kell, M.D.  [address]  [address]  [place] [address]   CV Laboratory   Patient's medical record

## 2010-09-13 NOTE — Discharge Summary (Signed)
Stephanie Gross, Stephanie Gross                 ACCOUNT NO.:  [id number]   MEDICAL RECORD NO.:  [id number]          PATIENT TYPE:  INP   LOCATION:  2028                         FACILITY:  [facility]   PHYSICIAN:  Deborra Medina, M.D.     DATE OF BIRTH:  Feb 25, 1946   DATE OF ADMISSION:  04/19/2005  DATE OF DISCHARGE:  04/20/2005                                 DISCHARGE SUMMARY   DISCHARGE DIAGNOSES:  1.  Atypical chest pain.  2.  Hypertension.  3.  Diabetes Mellitus, poorly controlled with gastroparesis and peripheral      neuropathy.  4.  Hyperlipidemia.   DISCHARGE MEDICATIONS:  1.  Enalapril 40 mg once daily.  2.  Neurontin 300 mg twice daily.  3.  Glucotrol 10 mg twice daily.  4.  Hydrochlorothiazide 12.5 mg once daily.  5.  Lantus 10 units in the evening.  6.  Reglan 5 mg with meals and at night.  7.  Zocor 20 mg once daily.  8.  Glucophage 500 mg twice daily (please do not take 24 hours prior to      Cardiolite and 48 hours after Cardiolite).   DISPOSITION AND FOLLOWUP:  Stephanie Gross was discharged in her normal state of  health with no chest pain.  She was to followup with [facility] who  will call her for an appointment.  She was told that if [facility]  had not contacted her by Wednesday 04/23/2005 that she was to call the  office at [phone].  She was also to followup at the [facility] in 2 weeks and to schedule her own appointment by calling [phone].   ADMITTING HISTORY AND PHYSICAL:  Stephanie Gross is a 65 year old African-  American female who has a past medical history significant for hypertension,  diabetes mellitus 2, and hyperlipidemia.  She began having chest pain 6  hours in duration over her left breast. Again, at 6 p.m. at the day prior to  admission.  She denies nausea or vomiting.  She also denies shortness of  breath.  She said that the pain radiated to her left shoulder and was  associated with some palpitations.  She denies diaphoresis,  diarrhea, fever,  and chills.   ALLERGIES:  CODEINE.   SUBSTANCE ABUSE:  A 1/2 pack a day smoker for 40 years.  No drugs. No  alcohol.   SOCIAL HISTORY:  She is married.  She lives with her husband.   FAMILY HISTORY:  Significant for diabetes and hypertension on her mother's  and father's side respectively.   PHYSICAL EXAMINATION:  VITAL SIGNS:  At the time of admission BP of 176/92,  pulse of 88, respirations 18-20.  GENERAL:  She was alert and oriented x3 in no acute distress. She was  pleasant.  HEENT:  Pupils are equal, round, and reacted to light.  Extraocular muscles  were intact.  NECK:  Supple.  No JVD.  LUNGS:  Clear to auscultation bilaterally.  CARDIOVASCULAR:  S1-S2 with an S4 gallop.  ABDOMEN:  Soft, nontender, nondistended with positive bowel sounds.  EXTREMITIES:  No clubbing, cyanosis, or edema.  Good pedal pulses.  NEUROLOGIC:  Exam was intact but nonfocal.  PSYCHIATRIC:  Appropriate.   LABS:  Sodium of 135, potassium of 4.0, chloride of 107, bicarb of 22, BUN  of 21, creatinine of 0.8, blood sugar of 399.  PT of 12, INR of 0.9, PTT of  28.  Seriial point of care cardiac markers were negative for elevated  Troponin I levels, as were her cardiac ezymes.  Hgb 10.4, MCV 88, platelets  235.  Hgb A1c > 13.   HOSPITAL COURSE BY PROBLEM:   1.  CHEST PAIN:  Patient was admitted for evaluation of chest pain with      serial EKG's and cardiac enzymes.  She had no evidence of ischemia by      repeat serologic studies but had significant cardiac risk factors      including poorly controlled diabetes, hypertension, and hyperlipidemia.      Because of the impending holiday, she did not wish to remain in house      over the holiday and was discharged home to followup with [facility]      Cardiology for risk stratification with outpatient cardiolyte as soon as      possible.   1.  DIABETES MELLITUS:  Patient had evidence of poor control with HgbA1c >      13 and was  discharged on 2 oral medications and Lantus.  She was      referred to the outpatient clinic for diabetes education and management.      Erma Heritage, M.D.      Deborra Medina, M.D.  Electronically Signed    TE/MEDQ  D:  04/20/2005  T:  04/22/2005  Job:  [id number]

## 2010-09-13 NOTE — Discharge Summary (Signed)
NAME:  RAMSAY, KOLLMANN                           ACCOUNT NO.:  [id number]   MEDICAL RECORD NO.:  [id number]                   PATIENT TYPE:  INP   LOCATION:  5033                                 FACILITY:  [facility]   PHYSICIAN:  Starling Manns, M.D.                  DATE OF BIRTH:  08-03-1945   DATE OF ADMISSION:  11/29/2001  DATE OF DISCHARGE:  12/03/2001                                 DISCHARGE SUMMARY   ADMISSION DIAGNOSES:  1. Left patella fracture.  2. Diabetes type 2.  3. Hypertension.   DISCHARGE DIAGNOSES:  1. Status post open reduction and internal fixation of a left patella     fracture.  2. Poorly controlled diabetes, medication change, under better control prior     to discharge.  3. Hypertension.   PROCEDURE:  Left patella ORIF by excision of patella fragments and repair of  left patellar tendon.  This was done by Starling Manns, M.D. with the  assistance of Alexzandrew L. Perkins, P.A.C.  Anesthesia was general.   CONSULTATIONS:  Medical teaching service and family practice teaching  service for diabetes assistance.   HISTORY OF PRESENT ILLNESS:  The patient is a 65 year old female who was  admitted secondary to left knee pain.  She was at [employer] and fell forward  landing on her left knee.  At the time of the fall she had excruciating pain  in her left knee and was unable to stand, bear weight, or straighten her  knee.  She was brought to the [facility] emergency department and  found to have a comminuted left patellar fracture and admitted.  Dr. Patrice Paradise  was consulted for orthopedic evaluation and management.   LABORATORY DATA:  Admission CBC; white count 12.6, hemoglobin 11.8,  hematocrit 35.1, otherwise normal.  PT, INR, and PTT were 12.7, 0.9, and 31  respectively.  Basic metabolic panel was within normal limits with the  exception of elevated glucose at 238 and repeat at 239.  UA on admission  showed many epithelials and 30 of protein.  This was  repeated on August 8  and it was negative.  Blood testing on November 30, 2001; blood type O/A, Rh  type positive, antibody screen negative.  Urine culture from August 8 showed  multiple species present with no uropathogens isolated, probable  contamination.   X-rays from August 4, showed a comminuted displaced patellar fracture.  Chest x-ray showed no evidence of active disease or interval changes in the  lungs.  August 5, intraoperative C-arm films showed dominant patella  fragment appears to be superiorly dislocated in the inferior pole.  Fragment  was not well seen, limited examination due to examination technique and  windowing.  EKG on November 30, 2001, showed normal sinus rhythm, moderate  voltage criteria for LVH, may be normal variant.  Additional comments;  precordial transition zone has moved  from V4 to V2-3 since the previous  tracing on December of 2002.  This was read by Orlando Penner. Sevier, M.D.   HOSPITAL COURSE:  The patient was admitted on November 30, 2001, secondary to  the above listed problem. She was taken to the operating room later that day  and underwent the ORIF of the patella.  She tolerated the procedure well  without any intraoperative complications.  Tourniquet time was 46 minutes.   She was intraoperatively and postoperatively started and completed an  appropriate antibiotic course.  Postoperatively her pain was controlled  using a combination of IV as well as p.o. analgesics with transition to  strictly p.o. analgesics on postoperative day 1 to 2.  Did very well with  this, but incentive spirometer was utilized to decrease the chance of any  pulmonary complications.  Serial checks were done throughout her hospital  stay and remained intact without any problems.   Did note on preoperative as well as postoperative metabolic panels her  glucose to be elevated despite being on a diabetic diet as well as her home  medications.  Teaching service was consulted to assist  [place] with medication  adjustments prior to the patient being discharged.   Physical therapy and occupational therapy was consulted to assist the  patient with ambulation and transfers.  She did well with them, walking  independently utilizing a walker without any assistance.  She was  weightbearing as tolerated.  She was in a straight-leg immobilizer and this  was to remain on at all times.  Dressing was changed prior to discharge,  revealed a good looking incision.  Dressing changes were done and remained  looking good and did not have any signs or symptoms of infection.  No  significant drainage and no purulence.   We will assist the patient with arranging any home needs and equipment.  By  December 03, 2001, the patient had met all orthopedic goals and she was  medically stable and ready for discharge.  Her diabetes medications have  been changed by family practice teaching service.  Vital signs were stable  and she was afebrile, neurovascular intact, and incision looked excellent.   ACTIVITY:  She is weightbearing as tolerated in immobilizer at all times.  Keep the incision clean and dry.   FOLLOW UP:  She is to follow up two weeks postoperatively with Dr. Patrice Paradise.  She was instructed to call [phone] to set up an appointment for this.  We  recommended 1800 calorie ADA diet.  The patient is also to follow up with  teaching service and her primary care physician for diabetes management.   DISCHARGE MEDICATIONS:  Orthopedic medications; Percocet, Robaxin, and  enteric-coated aspirin q.d.   CONDITION ON DISCHARGE:  Stable and improved.   DISPOSITION:  The patient is being discharged to here home.     Karenann Cai, P.A.                       Starling Manns, M.D.    CM/MEDQ  D:  01/05/2002  T:  01/07/2002  Job:  [id number]

## 2010-09-19 ENCOUNTER — Other Ambulatory Visit: Payer: Self-pay | Admitting: Internal Medicine

## 2010-12-20 ENCOUNTER — Other Ambulatory Visit: Payer: Self-pay | Admitting: *Deleted

## 2010-12-20 MED ORDER — ISOSORBIDE MONONITRATE ER 30 MG PO TB24: 30.0000 mg | ORAL_TABLET | Freq: Every day | ORAL | Status: DC

## 2010-12-20 NOTE — Telephone Encounter (Signed)
Pt is out of this med

## 2011-01-17 LAB — CBC
Platelets: 204
RBC: 4.08
WBC: 9.5

## 2011-01-17 LAB — BASIC METABOLIC PANEL
BUN: 14
Calcium: 9.7
Creatinine, Ser: 0.69
GFR calc Af Amer: >60
GFR calc non Af Amer: >60

## 2011-01-27 LAB — GLUCOSE, CAPILLARY: Glucose-Capillary: 131 — ABNORMAL HIGH

## 2011-02-11 LAB — I-STAT 8, (EC8 V) (CONVERTED LAB)
Bicarbonate: 25.2 — ABNORMAL HIGH
HCT: 40
Hemoglobin: 13.6
Operator id: 272551
Sodium: 142
TCO2: 27
pCO2, Ven: 48.7

## 2011-02-11 LAB — POCT I-STAT CREATININE: Operator id: 272551

## 2011-03-13 ENCOUNTER — Encounter: Payer: Self-pay | Admitting: Internal Medicine

## 2011-03-13 ENCOUNTER — Ambulatory Visit (INDEPENDENT_AMBULATORY_CARE_PROVIDER_SITE_OTHER): Payer: Self-pay | Admitting: Internal Medicine

## 2011-03-13 VITALS — BP 153/73 | HR 67 | Temp 97.6°F | Ht 65.5 in | Wt 157.6 lb

## 2011-03-13 DIAGNOSIS — I10 Essential (primary) hypertension: Secondary | ICD-10-CM

## 2011-03-13 DIAGNOSIS — F17200 Nicotine dependence, unspecified, uncomplicated: Secondary | ICD-10-CM

## 2011-03-13 DIAGNOSIS — Z0000 Encounter for general adult medical examination without abnormal findings: Secondary | ICD-10-CM

## 2011-03-13 DIAGNOSIS — E119 Type 2 diabetes mellitus without complications: Secondary | ICD-10-CM

## 2011-03-13 DIAGNOSIS — M7500 Adhesive capsulitis of unspecified shoulder: Secondary | ICD-10-CM

## 2011-03-13 DIAGNOSIS — E785 Hyperlipidemia, unspecified: Secondary | ICD-10-CM

## 2011-03-13 DIAGNOSIS — I2510 Atherosclerotic heart disease of native coronary artery without angina pectoris: Secondary | ICD-10-CM

## 2011-03-13 LAB — GLUCOSE, CAPILLARY: Glucose-Capillary: 193 mg/dL — ABNORMAL HIGH (ref 70–99)

## 2011-03-13 LAB — BASIC METABOLIC PANEL
CO2: 23 mEq/L (ref 19–32)
Calcium: 10.1 mg/dL (ref 8.4–10.5)
Chloride: 106 mEq/L (ref 96–112)
Creat: 0.97 mg/dL (ref 0.50–1.10)
Glucose, Bld: 176 mg/dL — ABNORMAL HIGH (ref 70–99)
Sodium: 139 mEq/L (ref 135–145)

## 2011-03-13 MED ORDER — HYDROCODONE-ACETAMINOPHEN 5-500 MG PO TABS: 2.0000 | ORAL_TABLET | Freq: Four times a day (QID) | ORAL | Status: DC | PRN

## 2011-03-13 MED ORDER — LISINOPRIL-HYDROCHLOROTHIAZIDE 20-12.5 MG PO TABS: 2.0000 | ORAL_TABLET | Freq: Every day | ORAL | Status: DC

## 2011-03-13 NOTE — Patient Instructions (Addendum)
Please start taking 32 units of lantus, along with sliding scale insulin. Please measure your blood sugars in the morning before breakfast. Please start taking 2 tablets of the Lisinopril/HCTZ combination.  Please follow up in 1 month for blood work and repeat blood pressure check. Please follow up with mammogram and eye exam as scheduled.  Please talk to [facility].

## 2011-03-13 NOTE — Assessment & Plan Note (Signed)
Currently asymptomatic without angina. Continue medical management with isosorbide mononitrate, beta blocker and Plavix. Patient is status post stenting in the past. Patient was strongly advised to stop smoking. She cannot tolerate statin medications and plus strict diet and lifestyle were advised to patient.

## 2011-03-13 NOTE — Assessment & Plan Note (Addendum)
Lab Results  Component Value Date   CHOL 233* 08/13/2010   HDL 53 08/13/2010   LDLCALC 158* 08/13/2010   LDLDIRECT 111* 09/15/2008   TRIG 111 08/13/2010   CHOLHDL 4.4 08/13/2010   Patient cannot tolerate statins and has tried many different kinds of statins. She has been on rice and fish oil supplements for about a year now. She does admit to eating a lot of foods high in saturated fats. Patient's Framingham risk score calculated and was 12%. Patient was advised if she were to stop smoking as well as lower cholesterol her risk would go low as 4%. Patient states that she will try her best to be compliant with a diet that is low in cholesterol. She was encouraged to meet with diabetes and nutrition educator however she states that she will try on her own for now

## 2011-03-13 NOTE — Assessment & Plan Note (Signed)
Patient still smoking. She does not have any intentions to quit at this time. She was encouraged and advised to quit smoking as it will make her overall risk for coronary event higher. Patient did express understanding of this and states that she will try to cut back.

## 2011-03-13 NOTE — Progress Notes (Signed)
  Subjective:    Patient ID: Stephanie Gross, female    DOB: October 07, 1945, 65 y.o.   MRN: [id number]  HPI [name] is a 65 year old woman with pmh significant for DM, CAD, HTN, and HLD who presents for general follow-up.   1.) Frozen shoulder - patient states that she has had rotator cuff problems with her right shoulder however her current pain is located in her left shoulder. She is unable to move her right arm to her back and is unable to lift her arm up.   2.) DM - checks CBGs once daily usually in the evening. Evening sugars run up to 300. She admits to not maintaining a diabetic diet.   Patient has no other complaints or concerns today. She denies chest pain, cough, sob, headache, N/V, changes in abdominal and urinary character.    Review of Systems  All other systems reviewed and are negative.       Objective:   Physical Exam  Vitals reviewed. Constitutional: She appears well-developed.  HENT:  Head: Normocephalic.  Eyes: Pupils are equal, round, and reactive to light.  Neck: Normal range of motion.  Cardiovascular: Normal rate and regular rhythm.   Pulmonary/Chest: Effort normal and breath sounds normal.  Abdominal: Soft. Bowel sounds are normal.  Musculoskeletal:       Difficulty with extension, flexion, internal and external rotation and abduction and adduction of the left shoulder and arm. Abduction and abduction were affected the most.          Assessment & Plan:

## 2011-03-13 NOTE — Assessment & Plan Note (Addendum)
Lab Results  Component Value Date   NA 142 08/13/2010   K 4.4 08/13/2010   CL 108 08/13/2010   CO2 23 08/13/2010   BUN 24* 08/13/2010   CREATININE 0.97 08/13/2010   CREATININE 0.92 08/27/2009    BP Readings from Last 3 Encounters:  03/13/11 153/73  08/13/10 168/85  08/08/10 190/80    Assessment: Hypertension control:  mildly elevated On Manual recheck bp still elevated at 158/80 Progress toward goals:  improved Barriers to meeting goals:  no barriers identified  Plan: Hypertension treatment:  continue current medications Will advise patient to take 2 of Lisinopril/HCTZ as it will max out the dose to 40/25 mg

## 2011-03-13 NOTE — Assessment & Plan Note (Addendum)
Lab Results  Component Value Date   HGBA1C 8.5 03/13/2011   HGBA1C 6.7 08/27/2009   CREATININE 0.97 08/13/2010   CREATININE 0.92 08/27/2009   MICROALBUR 16.05* 01/23/2009   MICRALBCREAT 100.0* 01/23/2009   CHOL 233* 08/13/2010   HDL 53 08/13/2010   TRIG 111 08/13/2010    Last eye exam and foot exam: No results found for this basename: HMDIABEYEEXA, HMDIABFOOTEX    Assessment: Diabetes control: not controlled Progress toward goals: deteriorated Barriers to meeting goals: lack of understanding of disease management  Plan: Diabetes treatment: continue current medications Increase Lantus to 32 units Refer to: none Instruction/counseling given: reminded to get eye exam, reminded to bring blood glucose meter & log to each visit, reminded to bring medications to each visit, discussed foot care, discussed the need for weight loss and discussed diet Schedule eye exam for patient when she receives orange card. Increase SSI as pre-prandial sugars are also elevated.

## 2011-03-13 NOTE — Assessment & Plan Note (Signed)
Physical exam findings consistent with frozen shoulder versus rotator cuff tendinitis. Patient is without medical insurance or orange card. Patient has tried different NSAIDs including Naprosyn and ibuprofen. Given the degree of pain will prescribe patient Vicodin only short-term. Patient will be reevaluated in one month. Patient will also followup with Dr. held to obtain orange card. Patient would benefit from sports medicine but do to lack of insurance cannot see them at this time. If patient cannot be seen by sports medicine and is requiring ongoing opiate pain medication she will need to have a pain contract filled out at next visit.

## 2011-03-21 ENCOUNTER — Other Ambulatory Visit: Payer: Self-pay | Admitting: Internal Medicine

## 2011-03-21 DIAGNOSIS — Z1231 Encounter for screening mammogram for malignant neoplasm of breast: Secondary | ICD-10-CM

## 2011-03-24 ENCOUNTER — Other Ambulatory Visit: Payer: Self-pay | Admitting: Internal Medicine

## 2011-03-24 ENCOUNTER — Ambulatory Visit (INDEPENDENT_AMBULATORY_CARE_PROVIDER_SITE_OTHER): Payer: Self-pay | Admitting: Internal Medicine

## 2011-03-24 ENCOUNTER — Encounter: Payer: Self-pay | Admitting: Internal Medicine

## 2011-03-24 VITALS — BP 155/85 | HR 81 | Temp 98.6°F | Resp 20 | Ht 66.5 in | Wt 155.4 lb

## 2011-03-24 DIAGNOSIS — M549 Dorsalgia, unspecified: Secondary | ICD-10-CM | POA: Insufficient documentation

## 2011-03-24 DIAGNOSIS — I10 Essential (primary) hypertension: Secondary | ICD-10-CM

## 2011-03-24 LAB — BASIC METABOLIC PANEL
BUN: 29 mg/dL — ABNORMAL HIGH (ref 6–23)
Chloride: 104 mEq/L (ref 96–112)
Creat: 1.11 mg/dL — ABNORMAL HIGH (ref 0.50–1.10)
Glucose, Bld: 197 mg/dL — ABNORMAL HIGH (ref 70–99)
Potassium: 3.8 mEq/L (ref 3.5–5.3)

## 2011-03-24 MED ORDER — CYCLOBENZAPRINE HCL 10 MG PO TABS: 10.0000 mg | ORAL_TABLET | Freq: Three times a day (TID) | ORAL | Status: DC | PRN

## 2011-03-24 MED ORDER — NAPROXEN 375 MG PO TABS: 375.0000 mg | ORAL_TABLET | Freq: Two times a day (BID) | ORAL | Status: DC

## 2011-03-24 NOTE — Progress Notes (Signed)
  Subjective:    Patient ID: Stephanie Gross, female    DOB: [birth year], 65 y.o.   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 Patient was last seen by myself 2 weeks prior where patient was evaluated for her chronic comorbidities.   1.) HTN - At that time patient's blood pressure medicine was increased and patient was instructed to followup in 2 weeks for repeat blood pressure check and labs. Patient states she is taking the increased dose as directed.  2.) Rotator cuff pathology - patient was prescribed Vicodin for pain control given ineffectiveness of NSAIDs. Patient needs to obtain orange card before she can followup with sports medicine.  3.) Back pain - started last Thursday. Patient states she was carrying two 5-gallon buckets full of juice and pain started shortly after. She complains of back spasms. No bowel or urinary incontinence, no fevers, or chills, no numbness or weakness. Has tried taking vicodin and tylenol without relief of pain.  Patient has no other complaints or concerns today. She denies chest pain, cough, sob, headache, N/V, changes in abdominal and urinary character.    Review of Systems  All other systems reviewed and are negative.       Objective:   Physical Exam  Constitutional: She appears well-developed.  HENT:  Head: Normocephalic.  Eyes: Pupils are equal, round, and reactive to light.  Neck: Normal range of motion. Neck supple.  Cardiovascular: Normal rate and regular rhythm.   Pulmonary/Chest: Effort normal and breath sounds normal.  Musculoskeletal:       Decreased range of motion secondary to pain Tender to palpation sacral region  Neurological: She has normal reflexes.          Assessment & Plan:

## 2011-03-24 NOTE — Assessment & Plan Note (Signed)
Given heavy lifting last week this is most likely lumbar strain. There are no red flag symptoms such as fever, chills, bowel or urinary incontinence, numbness or weakness. Will prescribe patient works around as well as naproxen for back spasms, pain and inflammation. Patient was instructed to take the Naprosyn with food and to take her Nexium as it may cause GI upset. Patient was not given another prescription for Vicodin she states that it did not help her pain very much.

## 2011-03-24 NOTE — Patient Instructions (Signed)
Please follow up in 2-4 weeks or sooner if symptoms do not resolve. Please take Naprosyn for back pain. Please take Flexeril for back spasms. Please take all medications as directed.

## 2011-03-24 NOTE — Assessment & Plan Note (Addendum)
Lab Results  Component Value Date   NA 139 03/13/2011   K 4.3 03/13/2011   CL 106 03/13/2011   CO2 23 03/13/2011   BUN 19 03/13/2011   CREATININE 0.97 03/13/2011   CREATININE 0.92 08/27/2009    BP Readings from Last 3 Encounters:  03/24/11 155/85  03/13/11 153/73  08/13/10 168/85    Assessment: Hypertension control:  mildly elevated  Progress toward goals:  unchanged Barriers to meeting goals:  Back pain  Plan: Hypertension treatment:  continue current medications I believe that patient's blood pressure is elevated secondary to back spasms and exquisite back pain. At this time will not change therapy. Will have patient return in 2-4 weeks when back pain has hopefully resolve to recheck blood pressure. In the meantime we will check BMET today to followup on renal function as patient's lisinopril and hydrochlorothiazide was increased.

## 2011-03-25 NOTE — Progress Notes (Signed)
Called to the [facility].  Sander Nephew, RN 03/25/2011 10:00 AM

## 2011-03-25 NOTE — Progress Notes (Signed)
Prescription for naproxen called to [facility]. Sander Nephew, RN 03/25/2011 9:47 AM

## 2011-03-25 NOTE — Progress Notes (Signed)
Prescription for Flexeril 10 mg tablets called to the [facility].  Sander Nephew, RN 03/25/2011 9:48 AM.

## 2011-04-14 ENCOUNTER — Encounter: Payer: Self-pay | Admitting: Internal Medicine

## 2011-04-14 ENCOUNTER — Ambulatory Visit (INDEPENDENT_AMBULATORY_CARE_PROVIDER_SITE_OTHER): Payer: Self-pay | Admitting: Internal Medicine

## 2011-04-14 DIAGNOSIS — I10 Essential (primary) hypertension: Secondary | ICD-10-CM

## 2011-04-14 DIAGNOSIS — I2510 Atherosclerotic heart disease of native coronary artery without angina pectoris: Secondary | ICD-10-CM

## 2011-04-14 DIAGNOSIS — E119 Type 2 diabetes mellitus without complications: Secondary | ICD-10-CM

## 2011-04-14 LAB — GLUCOSE, CAPILLARY: Glucose-Capillary: 103 mg/dL — ABNORMAL HIGH (ref 70–99)

## 2011-04-14 MED ORDER — CLOPIDOGREL BISULFATE 75 MG PO TABS: 75.0000 mg | ORAL_TABLET | Freq: Every day | ORAL | Status: DC

## 2011-04-14 MED ORDER — INSULIN GLARGINE 100 UNIT/ML ~~LOC~~ SOLN: 32.0000 [IU] | Freq: Every day | SUBCUTANEOUS | Status: DC

## 2011-04-14 MED ORDER — LISINOPRIL-HYDROCHLOROTHIAZIDE 20-12.5 MG PO TABS: 2.0000 | ORAL_TABLET | Freq: Every day | ORAL | Status: DC

## 2011-04-14 MED ORDER — ISOSORBIDE MONONITRATE ER 30 MG PO TB24: 30.0000 mg | ORAL_TABLET | Freq: Every day | ORAL | Status: DC

## 2011-04-14 NOTE — Patient Instructions (Signed)
1.  Continue taking your medication as prescribed.    2.  Make sure you take care of getting your [facility] Card after the first of the year.  3.  Follow up after the first of the year to see how you are doing.

## 2011-04-14 NOTE — Progress Notes (Signed)
Subjective:   Patient ID: Stephanie Gross female   DOB: 10/04/1945 65 y.o.   MRN: [id number]  HPI: Stephanie Gross is a 65 y.o. woman who presents to clinic today for a medication refill and check of her chronic medical problems including diabetes, hypertension, and CAD.    She states that she recently retired and lost her insurance but because she worked in 2012 she is unable to qualify for the orange card until after the first of the year.  She states that she is on her last pen of Lantus.  She has plenty of novolog still and does sliding scale with meal coverage.  She has not been able to afford any strips for her meter so she has not been checking her blood sugars.    She states that she ran out of lisinopril/HCTZ because she was told to double them up and only got 30 tablets on her last refill but has been able to afford her Imdur and metoprolol.  She denies headaches, blurry vision, or dizziness on standing.    Past Medical History  Diagnosis Date  . CAD (coronary artery disease)     s/p RCA stent 2007. Myoview 2010 normal  . DM (diabetes mellitus)   . HTN (hypertension)   . Tobacco use disorder   . Hyperlipidemia    Current Outpatient Prescriptions  Medication Sig Dispense Refill  . clopidogrel (PLAVIX) 75 MG tablet Take 75 mg by mouth daily.        . cyclobenzaprine (FLEXERIL) 10 MG tablet Take 1 tablet (10 mg total) by mouth every 8 (eight) hours as needed for muscle spasms.  30 tablet  1  . esomeprazole (NEXIUM) 20 MG capsule Take 20 mg by mouth daily before breakfast.        . fish oil-omega-3 fatty acids 1000 MG capsule Take 1 g by mouth 3 (three) times daily.        Marland Kitchen HYDROcodone-acetaminophen (VICODIN) 5-500 MG per tablet Take 2 tablets by mouth every 6 (six) hours as needed for pain.  60 tablet  0  . insulin aspart (NOVOLOG) 100 UNIT/ML injection As directed  10 mL  3  . insulin glargine (LANTUS) 100 UNIT/ML injection Inject 32 Units into the skin at bedtime.        .  isosorbide mononitrate (IMDUR) 30 MG 24 hr tablet Take 1 tablet (30 mg total) by mouth daily.  30 tablet  3  . lisinopril-hydrochlorothiazide (PRINZIDE) 20-12.5 MG per tablet Take 2 tablets by mouth daily.  60 tablet  11  . metoprolol (LOPRESSOR) 50 MG tablet Take 50 mg by mouth 2 (two) times daily.        . naproxen (NAPROSYN) 375 MG tablet Take 1 tablet (375 mg total) by mouth 2 (two) times daily with a meal.  60 tablet  2  . Red Yeast Rice 600 MG CAPS Take 2 tabs daily       No family history on file. History   Social History  . Marital Status: Single    Spouse Name: N/A    Number of Children: N/A  . Years of Education: N/A   Occupational History  . unemployed    Social History Main Topics  . Smoking status: Current Everyday Smoker -- 0.5 packs/day    Types: Cigarettes  . Smokeless tobacco: None   Comment: will wait on referral for smoking cessation  . Alcohol Use: No  . Drug Use: No  . Sexually Active: None  Other Topics Concern  . None   Social History Narrative   Patient recently lost her job and is currently without insurance. - Nov 2012   Review of Systems: Negative except as noted in the HPI.   Objective:  Physical Exam: Filed Vitals:   04/14/11 0927  BP: 147/72  Pulse: 66  Temp: 97.6 F (36.4 C)  TempSrc: Oral  Resp: 20  Height: 5' 6.5" (1.689 m)  Weight: 153 lb (69.4 kg)   Constitutional: Vital signs reviewed.  Patient is a well-developed and well-nourished woman in no acute distress and cooperative with exam. Alert and oriented x3.  Head: Normocephalic and atraumatic Ear: TM normal bilaterally Mouth: no erythema or exudates, MMM Eyes: PERRL, EOMI, conjunctivae normal, No scleral icterus.  Neck: Supple, Trachea midline normal ROM, No JVD, mass, thyromegaly, or carotid bruit present.  Cardiovascular: RRR, S1 normal, S2 normal, no MRG, pulses symmetric and intact bilaterally Pulmonary/Chest: CTAB, no wheezes, rales, or rhonchi Abdominal: Soft.  Non-tender, non-distended, bowel sounds are normal, no masses, organomegaly, or guarding present.  GU: no CVA tenderness Musculoskeletal: No joint deformities, erythema, or stiffness, ROM full and no nontender Hematology: no cervical, inginal, or axillary adenopathy.  Neurological: A&O x3, Strength is normal and symmetric bilaterally, cranial nerve II-XII are grossly intact, no focal motor deficit, sensory intact to light touch bilaterally.  Skin: Warm, dry and intact. No rash, cyanosis, or clubbing.  Psychiatric: Normal mood and affect. speech and behavior is normal. Judgment and thought content normal. Cognition and memory are normal.   Assessment & Plan:

## 2011-04-23 ENCOUNTER — Ambulatory Visit (HOSPITAL_COMMUNITY): Payer: Self-pay

## 2011-05-02 ENCOUNTER — Other Ambulatory Visit: Payer: Self-pay | Admitting: Internal Medicine

## 2011-05-02 DIAGNOSIS — M67919 Unspecified disorder of synovium and tendon, unspecified shoulder: Secondary | ICD-10-CM

## 2011-05-02 DIAGNOSIS — M719 Bursopathy, unspecified: Secondary | ICD-10-CM

## 2011-05-06 NOTE — Progress Notes (Signed)
Done

## 2011-05-09 ENCOUNTER — Ambulatory Visit (INDEPENDENT_AMBULATORY_CARE_PROVIDER_SITE_OTHER): Payer: Self-pay | Admitting: Family Medicine

## 2011-05-09 VITALS — BP 120/82

## 2011-05-09 DIAGNOSIS — M7500 Adhesive capsulitis of unspecified shoulder: Secondary | ICD-10-CM | POA: Insufficient documentation

## 2011-05-09 NOTE — Progress Notes (Signed)
  Subjective:    Patient ID: Stephanie Gross, female    DOB: [birth year], 66 y.o.   MRN: [id number]  HPI  Left shoulder stiffness for the last couple of months. No specific injury. Similar symptoms to when she had a frozen shoulder on the right many years ago. She had that shoulder manipulated under anesthesia and has done well since then. This feels like exactly the same thing starting in her left shoulder. She is right-hand dominant. She's had no numbness or tingling in her hand or arm. She has had some shooting pains down into the upper deltoid muscle. She is having difficulty sleeping on that side at night.  PERTINENT  PMH / PSH: Prior history of right frozen shoulder She is on Plavix. Review of Systems See history of present illness. Additionally she denies fever, sweats, chills, unusual weight gain or loss.    Objective:   Physical Exam  Vital signs reviewed. GENERAL: Well developed, well nourished, no acute distress LEFT SHOULDER: Her rotator cuff muscle strength is intact in all planes of the rotator cuff. She has normal overhead flexion. She is limited in internal rotation. She can place her left hand on her posterior hip on the right side she can get her hand behind her back to the level of her brassiere. The a.c. joint on the left is mildly tender to palpation.  ULTRASOUND:  A.c. joint shows some mild arthropathy. There is no effusion. The supraspinatus muscle is intact as is the subscapularis. Both of these muscles have intermittent areas of calcification. I see no defect. The biceps tendon is normal in appearance without any surrounding edema and is located in the bicipital groove.  INJECTION: Patient was given informed consent, signed copy in the chart. Appropriate time out was taken. Area prepped and draped in usual sterile fashion. 2 cc of methylprednisolone 40 mg/ml plus  8 cc of 1% lidocaine without epinephrine was injected into the left subacromial space in the left leg humeral  joint placing approximately 2 cc in the subacromial space since the remaining 8 cc in the glenohumeral joint using a(n) posterior approach. The patient tolerated the procedure well. There were no complications. Post procedure instructions were given.       Assessment & Plan:  Left frozen shoulder(adhesive capsulitis) Corticosteroid injection today, home exercise program given. He'll see her back in 4 weeks. At that time we may need a second injection and she is aware of that and in agreement with the plan.

## 2011-05-12 NOTE — Assessment & Plan Note (Signed)
Lab Results  Component Value Date   NA 139 03/24/2011   K 3.8 03/24/2011   CL 104 03/24/2011   CO2 24 03/24/2011   BUN 29* 03/24/2011   CREATININE 1.11* 03/24/2011   CREATININE 0.92 08/27/2009    BP Readings from Last 3 Encounters:  05/09/11 120/82  04/14/11 147/72  03/24/11 155/85    Assessment: Hypertension control:  mildly elevated  Progress toward goals:  unchanged Barriers to meeting goals:  lack of insurance  Plan: Hypertension treatment:  continue current medications I refilled her medications today and will attempt to get her 60 of the Lisinopril/HCTZ tablets because that is her monthly supply.

## 2011-05-12 NOTE — Assessment & Plan Note (Signed)
She is getting her plavix from her cardiologists office currently and will continue on those for now.  Refills were given today.

## 2011-05-12 NOTE — Assessment & Plan Note (Signed)
Lab Results  Component Value Date   HGBA1C 8.5 03/13/2011   HGBA1C 7.2 08/13/2010   HGBA1C 6.7 08/27/2009   Lab Results  Component Value Date   MICROALBUR 16.05* 01/23/2009   LDLCALC 158* 08/13/2010   CREATININE 1.11* 03/24/2011   Her last A1C was rising from her previous.  She is hoping to qualify after the first of the year for her orange card to help her afford her medications.  She was given a sample Lantus vial and was told that she needs to get the orange card as soon as she can after the first of the year.   She will continue with her novolog on the sliding scale as well.

## 2011-05-15 ENCOUNTER — Other Ambulatory Visit: Payer: Self-pay | Admitting: *Deleted

## 2011-05-15 DIAGNOSIS — E119 Type 2 diabetes mellitus without complications: Secondary | ICD-10-CM

## 2011-05-15 NOTE — Telephone Encounter (Signed)
Pt is out of meds  She has the orange card and will apply to [employer] for assistance. I offered a sample lantus pen to last until paperwork is in to [employer] Pt will come in tomorrow

## 2011-05-16 MED ORDER — INSULIN GLARGINE 100 UNIT/ML ~~LOC~~ SOLN: 32.0000 [IU] | Freq: Every day | SUBCUTANEOUS | Status: DC

## 2011-05-16 NOTE — Telephone Encounter (Signed)
Rx called into [facility].  I have not seen pt today, she was going to [facility] today to fill out paperwork so she can get her insulin from [facility]

## 2011-05-21 ENCOUNTER — Ambulatory Visit (HOSPITAL_COMMUNITY)
Admission: RE | Admit: 2011-05-21 | Discharge: 2011-05-21 | Disposition: A | Discharge status: Home / Self Care | Payer: No Typology Code available for payment source | Source: Ambulatory Visit | Attending: Internal Medicine | Admitting: Internal Medicine

## 2011-05-21 DIAGNOSIS — Z1231 Encounter for screening mammogram for malignant neoplasm of breast: Secondary | ICD-10-CM

## 2011-05-29 ENCOUNTER — Ambulatory Visit (INDEPENDENT_AMBULATORY_CARE_PROVIDER_SITE_OTHER): Payer: Self-pay | Admitting: Internal Medicine

## 2011-05-29 ENCOUNTER — Encounter: Payer: Self-pay | Admitting: Internal Medicine

## 2011-05-29 ENCOUNTER — Ambulatory Visit: Payer: Self-pay | Admitting: Internal Medicine

## 2011-05-29 VITALS — BP 139/67 | HR 76 | Temp 97.1°F | Ht 65.5 in | Wt 159.9 lb

## 2011-05-29 DIAGNOSIS — I10 Essential (primary) hypertension: Secondary | ICD-10-CM

## 2011-05-29 DIAGNOSIS — I2510 Atherosclerotic heart disease of native coronary artery without angina pectoris: Secondary | ICD-10-CM

## 2011-05-29 DIAGNOSIS — E785 Hyperlipidemia, unspecified: Secondary | ICD-10-CM

## 2011-05-29 DIAGNOSIS — F17200 Nicotine dependence, unspecified, uncomplicated: Secondary | ICD-10-CM

## 2011-05-29 DIAGNOSIS — E119 Type 2 diabetes mellitus without complications: Secondary | ICD-10-CM

## 2011-05-29 LAB — BASIC METABOLIC PANEL
BUN: 20 mg/dL (ref 6–23)
CO2: 22 mEq/L (ref 19–32)
Calcium: 9.5 mg/dL (ref 8.4–10.5)
Creat: 1.07 mg/dL (ref 0.50–1.10)
Sodium: 143 mEq/L (ref 135–145)

## 2011-05-29 LAB — POCT GLYCOSYLATED HEMOGLOBIN (HGB A1C): Hemoglobin A1C: 7.9

## 2011-05-29 LAB — GLUCOSE, CAPILLARY: Glucose-Capillary: 195 mg/dL — ABNORMAL HIGH (ref 70–99)

## 2011-05-29 MED ORDER — INSULIN GLARGINE 100 UNIT/ML ~~LOC~~ SOLN: 27.0000 [IU] | Freq: Every day | SUBCUTANEOUS | Status: DC

## 2011-05-29 NOTE — Assessment & Plan Note (Signed)
Stable, without chest pain. Risk factors being monitored. Smoking cessation advised. On plavix.

## 2011-05-29 NOTE — Assessment & Plan Note (Signed)
Lab Results  Component Value Date   NA 139 03/24/2011   K 3.8 03/24/2011   CL 104 03/24/2011   CO2 24 03/24/2011   BUN 29* 03/24/2011   CREATININE 1.11* 03/24/2011   CREATININE 0.92 08/27/2009    BP Readings from Last 3 Encounters:  05/29/11 139/67  05/09/11 120/82  04/14/11 147/72    Assessment: Hypertension control:  controlled  Progress toward goals:  at goal Barriers to meeting goals:  no barriers identified  Plan: Hypertension treatment:  continue current medications Will check bmet since Cr mildly elevated when last checked

## 2011-05-29 NOTE — Assessment & Plan Note (Signed)
Intolerant to statins. On fish oil. Lipids not at goal. States improved diet control. Check lipid panel at next visit in 3 months.  Lab Results  Component Value Date   CHOL 233* 08/13/2010   HDL 53 08/13/2010   LDLCALC 158* 08/13/2010   LDLDIRECT 111* 09/15/2008   TRIG 111 08/13/2010   CHOLHDL 4.4 08/13/2010

## 2011-05-29 NOTE — Assessment & Plan Note (Signed)
States that she has cut back. Smokes less than half a pack per day. Once again reiterated the fact that patient has diabetes, HLD, HTN, and coronary artery disease with a stent placed and that her risk for any coronary event is higher and thus should quit smoking to decrease this risk. Patient reports that she will continue to cut back.

## 2011-05-29 NOTE — Patient Instructions (Signed)
Please schedule an appointment with Dr. Ina Homes in the first week of May 2013. Please start taking regular insulin 3-7 units prior to dinner.  Please take 27 units of Lantus at bedtime. Please continue to take all other medications as prescribed.  Please follow up with the Eye doctor as scheduled.

## 2011-05-29 NOTE — Progress Notes (Signed)
Subjective:     Patient ID: Stephanie Gross, female   DOB: 1945/11/21, 66 y.o.   MRN: [id number]  HPI [name] is a 66 -year-old woman with past medical history significant for type 2 diabetes, hyperlipidemia with intolerability to statin, coronary artery disease with drug-eluting stent on Plavix and hypertension who presents to the clinic for a general followup.  1.) DM - Patient brought her glucose meter. Patient's a.m. sugars run between 80 and 90. Patient's lunch and dinner sugars are between 190 and 300. Patient reports that she is compliant with her Lantus taking 32 units at bedtime. Patient was previously on sliding scale insulin however it is no longer take it anymore. Patient denies any lows or hypoglycemic symptoms such as lightheadedness, headache, dizziness.  2.) Adhesive capsulitis of shoulder - patient is status post steroid injection. Patient reports that she follows with sports medicine and is due for another visit in mid February. No complaints regarding this condition today.  Review of Systems  All other systems reviewed and are negative.       Objective:   Physical Exam  Constitutional: She is oriented to person, place, and time. She appears well-developed.  HENT:  Head: Normocephalic.  Eyes: Pupils are equal, round, and reactive to light.  Neck: Normal range of motion. Neck supple.  Cardiovascular: Normal rate, regular rhythm and normal heart sounds.  Exam reveals no gallop and no friction rub.   No murmur heard. Pulmonary/Chest: Effort normal and breath sounds normal.  Abdominal: Soft. Bowel sounds are normal.  Musculoskeletal: Normal range of motion.  Neurological: She is alert and oriented to person, place, and time.  Psychiatric: She has a normal mood and affect.

## 2011-05-29 NOTE — Assessment & Plan Note (Signed)
Lab Results  Component Value Date   HGBA1C 7.9 05/29/2011   HGBA1C 6.7 08/27/2009   CREATININE 1.11* 03/24/2011   CREATININE 0.92 08/27/2009   MICROALBUR 16.05* 01/23/2009   MICRALBCREAT 100.0* 01/23/2009   CHOL 233* 08/13/2010   HDL 53 08/13/2010   TRIG 111 08/13/2010    Last eye exam and foot exam: No results found for this basename: HMDIABEYEEXA, HMDIABFOOTEX  Referral made  Assessment: Diabetes control: fair Progress toward goals: improved Barriers to meeting goals: no barriers identified  Plan: Diabetes treatment: continue current medications. Will decrease lantus to 27 units since am sugars are around 80. Will add SSI because pm sugars range between 190-300. Patient will administer 3-7 units before dinner. Refer to: none Instruction/counseling given: reminded to bring blood glucose meter & log to each visit, reminded to bring medications to each visit, discussed foot care and discussed the need for weight loss

## 2011-06-02 ENCOUNTER — Other Ambulatory Visit: Payer: Self-pay | Admitting: *Deleted

## 2011-06-03 ENCOUNTER — Other Ambulatory Visit: Payer: Self-pay | Admitting: Internal Medicine

## 2011-06-03 MED ORDER — INSULIN ASPART 100 UNIT/ML ~~LOC~~ SOLN: SUBCUTANEOUS | Status: DC

## 2011-06-03 MED ORDER — INSULIN GLARGINE 100 UNIT/ML ~~LOC~~ SOLN: 27.0000 [IU] | Freq: Every day | SUBCUTANEOUS | Status: DC

## 2011-06-04 NOTE — Telephone Encounter (Signed)
Empty request 

## 2011-06-06 ENCOUNTER — Other Ambulatory Visit: Payer: Self-pay | Admitting: *Deleted

## 2011-06-06 ENCOUNTER — Other Ambulatory Visit: Payer: Self-pay | Admitting: Family Medicine

## 2011-06-06 ENCOUNTER — Ambulatory Visit (INDEPENDENT_AMBULATORY_CARE_PROVIDER_SITE_OTHER): Payer: Self-pay | Admitting: Family Medicine

## 2011-06-06 VITALS — BP 148/70

## 2011-06-06 DIAGNOSIS — M7500 Adhesive capsulitis of unspecified shoulder: Secondary | ICD-10-CM

## 2011-06-06 DIAGNOSIS — M5412 Radiculopathy, cervical region: Secondary | ICD-10-CM

## 2011-06-06 MED ORDER — GABAPENTIN 300 MG PO CAPS: ORAL_CAPSULE | ORAL | Status: DC

## 2011-06-06 NOTE — Progress Notes (Signed)
  Subjective:    Patient ID: Stephanie Gross, female    DOB: 12/25/1945, 66 y.o.   MRN: [id number]  HPI  Followup shoulder pain. No better in fact is somewhat worse in that she is now having pain shooting up her neck and across her left test. Pain is worse at night which tries to sleep. She's not gotten a good night sleep in the last 2 weeks. The shot helped for a couple of days but not much longer than that  Review of Systems    Pertinent review of systems: negative for fever or unusual weight change. Objective:   Physical Exam  Vital signs reviewed. GENERAL: Well  :developed, well nourished, no acute distress NECK: Positive Spurling's on the left. Full flexion and extension. SHOULDER: Positive pain with Spurling's and internal rotation. Strength is intact. Imaging review: MRI cervical spine from 2 years ago. Shows some disc protrusion C5 and C6.      Assessment & Plan:  #1 shoulder pain  #2. Cervical radiculopathy. After hearing the description of the worsening and there were-type pain today as I do wonder if more of this is related to a cervical radiculopathy and less to shoulder issue. I did review her MRI from couple of years ago. We will get a cervical spine MRI. I will start her on gabapentin. Patient having some improvement with sleep on the gabapentin she'll call me in a few days to make it at amitriptyline for opiates to help her sleep at night.

## 2011-06-06 NOTE — Progress Notes (Signed)
Pt can get rx at [facility] outpatient pharmacy for $12 for 90 capsules.

## 2011-06-06 NOTE — Progress Notes (Signed)
Addended by: Arnette Schaumann C on: 06/06/2011 02:13 PM   Modules accepted: Orders, Medications

## 2011-06-06 NOTE — Patient Instructions (Signed)
We have scheduled you for your MRI at [facility] on 06/10/11 at 4pm, please arrive at 3:45pm- 1st floor radiology.   Please follow up with Dr. Nori Riis on Friday 06/13/11.

## 2011-06-06 NOTE — Progress Notes (Signed)
Per Dr. Nori Riis - [facility] [address].

## 2011-06-06 NOTE — Progress Notes (Signed)
Evidently the charge for gabapentin at [facility] he is now $90. I spoke to the pharmacist. Let's [facility]. Please call her and asked her [facility] and then I have signed a prescription if you can just call it in. She called anti-gas but the price is it should be $12 or $15. By me know if this is not the case thanks

## 2011-06-10 ENCOUNTER — Ambulatory Visit (HOSPITAL_COMMUNITY)
Admission: RE | Admit: 2011-06-10 | Discharge: 2011-06-10 | Disposition: A | Discharge status: Home / Self Care | Payer: Self-pay | Source: Ambulatory Visit | Attending: Family Medicine | Admitting: Family Medicine

## 2011-06-10 DIAGNOSIS — M5412 Radiculopathy, cervical region: Secondary | ICD-10-CM

## 2011-06-10 DIAGNOSIS — M5030 Other cervical disc degeneration, unspecified cervical region: Secondary | ICD-10-CM | POA: Insufficient documentation

## 2011-06-10 DIAGNOSIS — M47812 Spondylosis without myelopathy or radiculopathy, cervical region: Secondary | ICD-10-CM | POA: Insufficient documentation

## 2011-06-11 ENCOUNTER — Telehealth: Payer: Self-pay | Admitting: Family Medicine

## 2011-06-11 NOTE — Telephone Encounter (Signed)
Called pt- left her a VM to return my call.

## 2011-06-11 NOTE — Telephone Encounter (Signed)
Stephanie Gross Please let her know that the MRI did show a bulging disc in her neck that is contributing to her left shoulder pain. The gabapentin is a perfect medicine for this. Hopefully we will be able to titrate it up to a point where it improves her pain. Also her the MRI pictures when I see her at her scheduled appointment on Friday. If she has any questions she needs answer before then please let me know THANKS! [name]

## 2011-06-11 NOTE — Telephone Encounter (Signed)
Discussed results with pt.

## 2011-06-13 ENCOUNTER — Encounter: Payer: Self-pay | Admitting: Family Medicine

## 2011-06-13 ENCOUNTER — Ambulatory Visit (INDEPENDENT_AMBULATORY_CARE_PROVIDER_SITE_OTHER): Payer: Self-pay | Admitting: Family Medicine

## 2011-06-13 VITALS — BP 155/73 | HR 69

## 2011-06-13 DIAGNOSIS — M5412 Radiculopathy, cervical region: Secondary | ICD-10-CM

## 2011-06-13 DIAGNOSIS — M25519 Pain in unspecified shoulder: Secondary | ICD-10-CM

## 2011-06-13 NOTE — Progress Notes (Signed)
  Subjective:    Patient ID: Stephanie Gross, female    DOB: 01-05-1946, 66 y.o.   MRN: [id number]  HPI  #1. Cervical radiculopathy is significantly improved on the gabapentin. She's only taking one of the 300 mg tabs at night. It really helps the pain but she does feel pretty groggy the next morning. Left arm still feels a little weak at times but is about 75% better as far as pain goes. #2 Now that her generalized shoulder pain and arm pain is improved she notes that she is having a second area of intense pain over her collar bone where In the breast bone. This aches quite a bit. Nothing seems to make it worse or better.  Review of Systems No fever, no unusual weight gain or loss.    Objective:   Physical Exam  Vital signs reviewed. GENERAL: Well developed, well nourished, no acute distress Clavicle: The left [place] joint is extremely tender to palpation. There is no erythema, no swelling, no effusion, no warmth. The left a.c. joint is nontender to palpation. The right a.c. joint is nontender to palpation.  Imaging: Reviewed her MRI films showing C6 nerve root pinching on the left.  ULTRASOUND:  Managing of bilateral [place] joints reveals arthritic changes bilaterally but significant effusion noted around the left  joint I do not see around the right. Neurologic he is light present on both sides.    Assessment & Plan:  #1. 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 She has really improved on the 300 mg of gabapentin. We will increase her to for her 50 mg at night that is 1-1/2 tablets. #2. Left sternoclavicular arthritis. We will try topical Aspercreme and see her back in 3-4 weeks. The other option would be to do a corticosteroid injection which I will probably do under ultrasound guidance. She is very happy with this plan I'll see her back in 3-4 weeks.

## 2011-06-26 ENCOUNTER — Other Ambulatory Visit: Payer: Self-pay | Admitting: Internal Medicine

## 2011-06-26 DIAGNOSIS — I10 Essential (primary) hypertension: Secondary | ICD-10-CM

## 2011-06-26 MED ORDER — ESOMEPRAZOLE MAGNESIUM 20 MG PO CPDR: 20.0000 mg | DELAYED_RELEASE_CAPSULE | Freq: Every day | ORAL | Status: DC

## 2011-06-26 MED ORDER — QUINAPRIL-HYDROCHLOROTHIAZIDE 20-12.5 MG PO TABS: 1.0000 | ORAL_TABLET | Freq: Every day | ORAL | Status: DC

## 2011-06-26 MED ORDER — METOPROLOL SUCCINATE ER 50 MG PO TB24: 50.0000 mg | ORAL_TABLET | Freq: Every day | ORAL | Status: DC

## 2011-06-26 MED ORDER — ISOSORBIDE MONONITRATE ER 30 MG PO TB24: 30.0000 mg | ORAL_TABLET | Freq: Every day | ORAL | Status: DC

## 2011-06-30 ENCOUNTER — Other Ambulatory Visit: Payer: Self-pay | Admitting: *Deleted

## 2011-06-30 MED ORDER — ESOMEPRAZOLE MAGNESIUM 20 MG PO CPDR: 20.0000 mg | DELAYED_RELEASE_CAPSULE | Freq: Every day | ORAL | Status: DC

## 2011-07-01 NOTE — Telephone Encounter (Signed)
Rx phoned to pharmacy.  

## 2011-07-07 ENCOUNTER — Telehealth: Payer: Self-pay | Admitting: *Deleted

## 2011-07-07 NOTE — Telephone Encounter (Signed)
Agree that current eye issues suggest the need for urgent evaluation and therapy.  We will also await the records to see what appropriate eye therapy and referral is necessary for the long term.

## 2011-07-07 NOTE — Telephone Encounter (Signed)
Pt called stating she was seen at Dr Ammie Ferrier office for eye exam about 2 1/2 weeks ago.  She was told she had something in left eye, bleeding behind eye.  He was going to send this info over. The last few days her eye has been red and draining, hard to open in the morning.  Pt # [id number]  I called [facility] and they will fax over notes from  last eye exam. Pt has no insurance so may need referral to [facility].  I will pass this infor to Lela for completion. I advised pt to go to [facility] for treatment of eye irritation/infection.

## 2011-07-11 ENCOUNTER — Ambulatory Visit (INDEPENDENT_AMBULATORY_CARE_PROVIDER_SITE_OTHER): Payer: Self-pay | Admitting: Family Medicine

## 2011-07-11 VITALS — BP 200/93

## 2011-07-11 DIAGNOSIS — M7550 Bursitis of unspecified shoulder: Secondary | ICD-10-CM

## 2011-07-11 DIAGNOSIS — I10 Essential (primary) hypertension: Secondary | ICD-10-CM

## 2011-07-11 DIAGNOSIS — M75100 Unspecified rotator cuff tear or rupture of unspecified shoulder, not specified as traumatic: Secondary | ICD-10-CM

## 2011-07-11 NOTE — Patient Instructions (Signed)
Thank you for coming in today. Do the exercises like we talked about.  Come back in 4 weeks.  Call immediately if you have a red swollen painful shoulder.

## 2011-07-11 NOTE — Assessment & Plan Note (Signed)
Patient significantly hypertensive today but does not have chest pain headache or trouble breathing. I suspect there is a component of anxiety and NSAID use.  Advised followup with primary care doctor

## 2011-07-11 NOTE — Progress Notes (Signed)
Stephanie Gross is a 66 y.o. female who presents to [facility] today for  Follow up left shoulder pain. Last visit was diagnosed with pain at the [facility] joint and advised to use asper cream. That Worked well and she only has to use Aspercreme intermittently.  Additionally she notes continued pain and left shoulder which is worsened recently. She notes pain that is in the deltoid it radiates to the biceps that occurs with reaching up or back. She denies any pain shooting down into her hand numbness or tingling   PMH reviewed. Significant for shoulder pain and hypertension ROS as above otherwise neg.  No headache chest pain palpitations trouble breathing. Medications reviewed.  Exam:  BP 200/93 Gen: Well NAD MSK: Left shoulder: normal-appearing.  Range of motion limited unable to actively abduct more than 100 however passively able to achieve full.  Forward flexion also limited to 100 actively and full passively.  External rotation limited to 120 and internal is full.   Strength is diminished on supraspinatus testing 4/5. External rotation and internal rotation are intact.  Hawkins positive Neers positive Empty can positive Speeds and the Yergason's positive  Neck range of motion is full without pain or radiculopathy.  Slow adduction arc normal.   Ultrasound Findings: Biceps tendon is intact.   Subscapularis tendon is intact and normal appearing Supraspinatus tendon is intact with small calcifications distal anterior.  The subacromial bursa is enlarged and thickened. There is blanching evident with impingement view.  Infraspinatus shows calcifications  Procedure:  Consent obtained. Landmarks palpated and marked with indention.  Area cleaned with betadine and cold spray applied. 70ml of lidocaine and 40mg  Depo-Medrol placed into the subacromial bursa with a 23 gauge 1.5 inch needle. No bleeding. Patient tolerated the procedure well.   A/P: continued shoulder pain likely due to remaining subacromial  bursitis. It was seen on [place] buncging up during movement without classic impingement. We discussed and will try a full subacromial bursa injection today (last one was split between the glenohumeral joint and the subacromial bursa. I gave her 2 types of Verelan and will start her on internal and external rotation strengthening with the blue Thera-Band. With the yellow Theragran she will start the supraspinatus strengthening but only go to 110 of forward flexion. Hopefully this will get some strength back without aggravating the bursa. We will see her back in 3-4 weeks.

## 2011-07-11 NOTE — Assessment & Plan Note (Signed)
Left subacromial bursitis present. This assessment is based upon history, exam findings significant for impingement and ultrasound findings show bursal thickening, Patient was injected with lidocaine and Depo-Medrol today. Plan to do stage I shoulder strengthening and range of motion exercises with no motion above 90 abduction and forward flexion.  We'll followup in 4 weeks for reassessment. Patient expresses understanding.

## 2011-07-18 ENCOUNTER — Other Ambulatory Visit: Payer: Self-pay | Admitting: Internal Medicine

## 2011-07-18 DIAGNOSIS — E119 Type 2 diabetes mellitus without complications: Secondary | ICD-10-CM

## 2011-08-01 ENCOUNTER — Telehealth: Payer: Self-pay | Admitting: *Deleted

## 2011-08-01 DIAGNOSIS — N390 Urinary tract infection, site not specified: Secondary | ICD-10-CM

## 2011-08-01 NOTE — Telephone Encounter (Signed)
Pharmacy wanted to check on direction for Accuretic from 06/26/11 - one tablet daily. Last directions for lisinopril / HCTZ was 2 tablets daily. [facility]  Hilda Blades Ruford Dudzinski RN 08/01/11 11:20AM

## 2011-08-04 MED ORDER — QUINAPRIL-HYDROCHLOROTHIAZIDE 20-12.5 MG PO TABS: 1.0000 | ORAL_TABLET | Freq: Every day | ORAL | Status: DC

## 2011-08-05 NOTE — Telephone Encounter (Signed)
[facility] pharmacy made awared of Accuretic 2 tabs daily per Dr Ina Homes.

## 2011-08-08 ENCOUNTER — Telehealth: Payer: Self-pay | Admitting: *Deleted

## 2011-08-08 MED ORDER — SULFAMETHOXAZOLE-TRIMETHOPRIM 800-160 MG PO TABS: 1.0000 | ORAL_TABLET | Freq: Two times a day (BID) | ORAL | Status: AC

## 2011-08-08 NOTE — Telephone Encounter (Signed)
Pt called states past 2 days problems with painful urination. Voiding small amounts freq. Using OTC med turns urine red. Dr Eppie Gibson aware and talked with pt by phone. Aws Shere RN 4.12/13 2:10PM

## 2011-08-08 NOTE — Telephone Encounter (Signed)
Spoke with [name] over the phone this afternoon.  She states that she has had 2 days of dysuria and frequency similar to her previous UTIs.  This is likely a recurrent uncomplicated cystitis and guidelines allow for empiric therapy without an office visit in this very situation.  I will therefore right for Bactrim DS 1 tablet PO BID for 5 days.  She is asked to call the clinic back early next week if there is no symptomatic response so that we can see her for re-evaluation.

## 2011-08-11 ENCOUNTER — Ambulatory Visit (INDEPENDENT_AMBULATORY_CARE_PROVIDER_SITE_OTHER): Payer: Self-pay | Admitting: Cardiovascular Disease

## 2011-08-11 ENCOUNTER — Encounter: Payer: Self-pay | Admitting: Cardiovascular Disease

## 2011-08-11 VITALS — BP 136/76 | HR 59 | Ht 65.0 in | Wt 158.0 lb

## 2011-08-11 DIAGNOSIS — I2510 Atherosclerotic heart disease of native coronary artery without angina pectoris: Secondary | ICD-10-CM

## 2011-08-11 DIAGNOSIS — I10 Essential (primary) hypertension: Secondary | ICD-10-CM

## 2011-08-11 NOTE — Assessment & Plan Note (Signed)
BP controlled. No changes.

## 2011-08-11 NOTE — Progress Notes (Signed)
History of Present Illness: 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 Velora Gross and then Dr. Haroldine Gross who I am meeting for the first time today. She has a history of coronary artery disease status post Taxus drug-eluting stent to the RCA in January 2007. She also has a history of diabetes, hypertension, hyperlipidemia and ongoing tobacco use. Most recent cardiac catheterization was January 2008, which showed minimal nonobstructive coronary artery disease with a patent RCA stent; however, there was a 90% stenosis in the midsection of the small PDA. This was treated medically. She had a stress myoview in 11/10 for atypical CP. EF 70% No ischemia/infarct. ABIs 11/10 0.97/1.01. She does not tolerate statins secondary to muscle weakness.   She is here today for cardiac follow up. She says she feels really good. Denies CP or dyspnea. Still smoking 1/2 ppd. Tried to quit with Chantix but couldn't tolerate. Still taking Plavix.    [facility] Physician: [facility]  Last Lipid Profile: Followed in primary care.   Past Medical History  Diagnosis Date  . CAD (coronary artery disease)     s/p RCA stent 2007. Myoview 2010 normal  . DM (diabetes mellitus)   . HTN (hypertension)   . Tobacco use disorder   . Hyperlipidemia     No past surgical history on file.  Current Outpatient Prescriptions  Medication Sig Dispense Refill  . clopidogrel (PLAVIX) 75 MG tablet Take 1 tablet (75 mg total) by mouth daily.  30 tablet  6  . esomeprazole (NEXIUM) 20 MG capsule Take 1 capsule (20 mg total) by mouth daily before breakfast.  90 capsule  3  . fish oil-omega-3 fatty acids 1000 MG capsule Take 1 g by mouth 3 (three) times daily.        . insulin aspart (NOVOLOG FLEXPEN) 100 UNIT/ML injection Per Dr Stephanie Gross instructions  15 pen  3  . insulin glargine (LANTUS SOLOSTAR) 100 UNIT/ML injection Inject 27 Units into the skin at  bedtime.  45 mL  3  . isosorbide mononitrate (IMDUR) 30 MG 24 hr tablet Take 1 tablet (30 mg total) by mouth daily.  90 tablet  3  . metoprolol succinate (TOPROL-XL) 50 MG 24 hr tablet Take 1 tab twice a day      . quinapril-hydrochlorothiazide (ACCURETIC) 20-12.5 MG per tablet Take 1 tablet by mouth daily.  60 tablet  6  . sulfamethoxazole-trimethoprim (BACTRIM DS) 800-160 MG per tablet Take 1 tablet by mouth 2 (two) times daily.  10 tablet  0  . DISCONTD: metoprolol succinate (TOPROL XL) 50 MG 24 hr tablet Take 1 tablet (50 mg total) by mouth daily. Take with or immediately following a meal.  30 tablet  11    Allergies  Allergen Reactions  . Codeine   . Statins     History   Social History  . Marital Status: Single    Spouse Name: N/A    Number of Children: N/A  . Years of Education: N/A   Occupational History  . unemployed    Social History Main Topics  . Smoking status: Current Everyday Smoker -- 0.4 packs/day    Types: Cigarettes  . Smokeless tobacco: Never Used  . Alcohol Use: No  . Drug Use: No  . Sexually Active: Not on file   Other Topics Concern  . Not on file   Social History Narrative   Patient recently lost her job and is  currently without insurance. - Nov 2012    No family history on file.  Review of Systems:  As stated in the HPI and otherwise negative.   BP 136/76  Pulse 59  Ht 5\' 5"  (1.651 m)  Wt 158 lb (71.668 kg)  BMI 26.29 kg/m2  Physical Examination: General: Well developed, well nourished, NAD HEENT: OP clear, mucus membranes moist SKIN: warm, dry. No rashes. Neuro: No focal deficits Musculoskeletal: Muscle strength 5/5 all ext Psychiatric: Mood and affect normal Neck: No JVD, no carotid bruits, no thyromegaly, no lymphadenopathy. Lungs:Clear bilaterally, no wheezes, rhonci, crackles Cardiovascular: Regular rate and rhythm. No murmurs, gallops or rubs. Abdomen:Soft. Bowel sounds present. Non-tender.  Extremities: No lower extremity  edema. Pulses are 2 + in the bilateral DP/PT.  EKG: NSR, rate 60 bpm.

## 2011-08-11 NOTE — Assessment & Plan Note (Signed)
Stable. Will continue Plavix as she is having no bleeding issues. She does not tolerate statins. Will talk to her about the Glendo study drug which is a CETP inhibitor. She is not on an ASA since she has been on Plavix.

## 2011-08-11 NOTE — Patient Instructions (Signed)
Your physician wants you to follow-up in:  12 months.  You will receive a reminder letter in the mail two months in advance. If you don't receive a letter, please call our office to schedule the follow-up appointment.   

## 2011-08-15 ENCOUNTER — Ambulatory Visit (INDEPENDENT_AMBULATORY_CARE_PROVIDER_SITE_OTHER): Payer: Self-pay | Admitting: Family Medicine

## 2011-08-15 DIAGNOSIS — M5412 Radiculopathy, cervical region: Secondary | ICD-10-CM

## 2011-08-15 MED ORDER — GABAPENTIN 300 MG PO CAPS: ORAL_CAPSULE | ORAL | Status: DC

## 2011-08-15 NOTE — Patient Instructions (Signed)
I really think that a lot of the pain in her left shoulder is coming from that disc problem in her neck. He is pushing on the nerve that comes down 3 or neck or arm and all the way down to your thumb and first finger. Let's restart the gabapentin. Start taking 300 mg at night for 3 nights. Take by tablet by mouth Day 1-3:   take one at night  Day 4-6:  Take 2 at night Day 7-10:  take 3 at night Day 11-14:  3 at night, 1 in am Day 15-18:   3 at night, 2 in am Day 19-21: 3 at night, 3 in am Day 22-25: 3 at night, 3 in am, 2 at lunch Day 26 forward : 3 at night, 3 in am, 3 at lunch  He see you back in about 6 weeks. We'll see how you're doing and if we need to change this plan. If you have any problems with the taper up of the medication please give me a call. If he developed sudden weakness of her left arm that is more severe than what he for her, please give me a call.

## 2011-08-17 NOTE — Progress Notes (Signed)
  Subjective:    Patient ID: Stephanie Gross, female    DOB: Aug 19, 1945, 66 y.o.   MRN: [id number]  HPI  F/u LEFT shoulder and arm pain .Known C6 radiculopathy secondary to severe foraminal stenosis. At last visit I gave her a subacromial injection thinking that would improve part of her issue---it did not help. She had increased her neurontin to 2 tabs (600 mg) and was not noticing any improvement, but did have some sleepiness so she stopped it.   Pain is left shoulder and esp deltoid area, radiating to her left index and long fingers. Worse with certain positions or movements.  Right hand dominant. Pain is keeping her awake at night. Has tried tylenol--no help. PERTINENT  PMH / PSH: Tobacco use DM Peripheral neuropathy C6 foraminal stenosis  Review of Systems    see hPI Pertinent review of systems: negative for fever or unusual weight change.  Objective:   Physical Exam  Vital signs reviewed. GENERAL: Well developed, well nourished, no acute distress LEFT UE pain with resisted abduction, resisted forward flexion. Distally neurovascularly ntact,no atrophy of muscles noted. Grip strength B symmetrical 5/5.  SKIN LUE and hand is normal as is temperature.      Assessment & Plan:  C6 radiculopathy--long discussion. I reviewed her MRI images again--discussed potential options including surgery (not really an option at this time as she has no insurance). She reallly would like to not even think about surgery even if it were anoption---we decided to retry yje gabapentin. Dosing discusswed and handout given. RTC 4-6 w. Discussed red flags to watch for--increasing wekaness or numbness LUE. Face to face time 40 minutes.

## 2011-08-21 ENCOUNTER — Telehealth: Payer: Self-pay | Admitting: *Deleted

## 2011-08-21 NOTE — Telephone Encounter (Signed)
Another call from [facility] for clarification on accuretic 20/12.5 Rx is one daily # 60. I read the note from 4/8 and it states pt is to take 2 daily.  This was called in. If it is not correct please let me know. Pt has scheduled appointment 5/1

## 2011-08-22 MED ORDER — QUINAPRIL-HYDROCHLOROTHIAZIDE 20-12.5 MG PO TABS: 1.0000 | ORAL_TABLET | Freq: Every day | ORAL | Status: DC

## 2011-08-22 NOTE — Telephone Encounter (Signed)
Pt informed to only take one accupril until next appointment on 5/1

## 2011-08-27 ENCOUNTER — Ambulatory Visit (INDEPENDENT_AMBULATORY_CARE_PROVIDER_SITE_OTHER): Payer: Self-pay | Admitting: Internal Medicine

## 2011-08-27 ENCOUNTER — Encounter: Payer: Self-pay | Admitting: Internal Medicine

## 2011-08-27 VITALS — BP 174/74 | HR 67 | Temp 97.5°F | Ht 65.0 in | Wt 160.6 lb

## 2011-08-27 DIAGNOSIS — E785 Hyperlipidemia, unspecified: Secondary | ICD-10-CM

## 2011-08-27 DIAGNOSIS — Z79899 Other long term (current) drug therapy: Secondary | ICD-10-CM

## 2011-08-27 DIAGNOSIS — E119 Type 2 diabetes mellitus without complications: Secondary | ICD-10-CM

## 2011-08-27 DIAGNOSIS — I10 Essential (primary) hypertension: Secondary | ICD-10-CM

## 2011-08-27 DIAGNOSIS — F17200 Nicotine dependence, unspecified, uncomplicated: Secondary | ICD-10-CM

## 2011-08-27 LAB — LIPID PANEL
Cholesterol: 283 mg/dL — ABNORMAL HIGH (ref 0–200)
Total CHOL/HDL Ratio: 5.9 Ratio
Triglycerides: 175 mg/dL — ABNORMAL HIGH (ref <150)

## 2011-08-27 LAB — GLUCOSE, CAPILLARY: Glucose-Capillary: 201 mg/dL — ABNORMAL HIGH (ref 70–99)

## 2011-08-27 MED ORDER — LISINOPRIL 40 MG PO TABS: 40.0000 mg | ORAL_TABLET | Freq: Every day | ORAL | Status: DC

## 2011-08-27 MED ORDER — INSULIN GLARGINE 100 UNIT/ML ~~LOC~~ SOLN: 27.0000 [IU] | Freq: Every day | SUBCUTANEOUS | Status: DC

## 2011-08-27 MED ORDER — INSULIN ASPART 100 UNIT/ML ~~LOC~~ SOLN: SUBCUTANEOUS | Status: DC

## 2011-08-27 MED ORDER — HYDROCHLOROTHIAZIDE 25 MG PO TABS: 25.0000 mg | ORAL_TABLET | Freq: Every day | ORAL | Status: DC

## 2011-08-27 NOTE — Patient Instructions (Signed)
Please follow up at the end of May with Dr. Ina Homes Please fill all prescriptions and take as directed.

## 2011-08-27 NOTE — Assessment & Plan Note (Addendum)
Lab Results  Component Value Date   CHOL 233* 08/13/2010   HDL 53 08/13/2010   LDLCALC 158* 08/13/2010   LDLDIRECT 111* 09/15/2008   TRIG 111 08/13/2010   CHOLHDL 4.4 08/13/2010   Patient trying diet control for HLD. On fish oil. LDL still above goal though. Will recheck lipid profile today.

## 2011-08-27 NOTE — Assessment & Plan Note (Signed)
Lab Results  Component Value Date   NA 143 05/29/2011   K 4.2 05/29/2011   CL 111 05/29/2011   CO2 22 05/29/2011   BUN 20 05/29/2011   CREATININE 1.07 05/29/2011   CREATININE 0.92 08/27/2009    BP Readings from Last 3 Encounters:  08/27/11 174/74  08/11/11 136/76  07/11/11 200/93    Assessment: Hypertension control:  moderately elevated  Progress toward goals:  deteriorated Barriers to meeting goals:  no barriers identified  Plan: Hypertension treatment:  Increase ACE-I dose as well as HCTZ and separate into 2 different medications as pharmacy has been confused about the dosing when patient was instructed to take 2 tablets once daily and were not giving enough tablets

## 2011-08-27 NOTE — Assessment & Plan Note (Signed)
Lab Results  Component Value Date   HGBA1C 8.3 08/27/2011   HGBA1C 6.7 08/27/2009   CREATININE 1.07 05/29/2011   CREATININE 0.92 08/27/2009   MICROALBUR 16.05* 01/23/2009   MICRALBCREAT 100.0* 01/23/2009   CHOL 233* 08/13/2010   HDL 53 08/13/2010   TRIG 111 08/13/2010    Last eye exam and foot exam: No results found for this basename: HMDIABEYEEXA, HMDIABFOOTEX    Assessment: Diabetes control: not controlled Progress toward goals: deteriorated Barriers to meeting goals: not being able to obtain insulin due to MAP program not having insulin available  Plan: Diabetes treatment: continue current medications. Will provide patient with additional script that she can present to another pharmacy if MAP does not have enough supply Refer to: none Instruction/counseling given: reminded to bring blood glucose meter & log to each visit, reminded to bring medications to each visit, discussed foot care and discussed diet

## 2011-08-27 NOTE — Assessment & Plan Note (Signed)
States she has increased cigarettes per day due to stress. Once again smoking cessation counseling was provided to patient, focusing on the rate of coronary events with just smoking alone, as well as with her risk factors.

## 2011-08-27 NOTE — Progress Notes (Signed)
Subjective:     Patient ID: Stephanie Gross, female   DOB: 1945-12-16, 66 y.o.   MRN: [id number]  HPI [name] is a 66 year old woman with history of DM II, HLD, HTN, CAD, and cervical radiculopathy who presents for general check up.   1) DM - Patient reports her blood sugar has been elevated because she has not received her insulin vials or flexpens in time. She states MAP program has been running out and that because of this she has had to half her insulin dose. She denies abdominal pain, urinary frequency, and does not have any episodes of hypoglycemia.   2) Cervical radiculopathy - with C6 nerve impingement. Recently prescribed gabapentin which relieves pain but causes drowsiness.   3) HTN - States she has only been taking one of accupril daily. She was instructed to take 2 tablets but pharmacy had confusion with this and only prescribed her 30 tablets.   Patient has no other complaints or concerns today. She denies chest pain, cough, sob, headache, N/V, changes in abdominal and urinary character.   Review of Systems  All other systems reviewed and are negative.       Objective:   Physical Exam  Constitutional: She appears well-developed.  HENT:  Head: Normocephalic.  Eyes: Pupils are equal, round, and reactive to light.  Cardiovascular: Normal rate and regular rhythm.   Pulmonary/Chest: Effort normal and breath sounds normal.  Abdominal: Soft. Bowel sounds are normal.  Musculoskeletal: Normal range of motion.

## 2011-09-01 ENCOUNTER — Telehealth: Payer: Self-pay | Admitting: *Deleted

## 2011-09-01 NOTE — Telephone Encounter (Signed)
There are 2 other fasting acting insulin pens besides Novolog. They are the Apridra Solostar and Humalog [employer]. They are all priced competitively and [facility] can get all 3 through the MAP program.    Called [facility] for clarification because they usually give Novolog flexpen or vial samples when they have them when they have all necessary paperwork in I have some coupons for the Humalog kwickpens This is what they said: "Unless her directions have changed from 9 units a day of Novolog 2-3 with breakfast 2-3 for lunch and 2-3 with dinner, she should have enough Novolog that they have given her to last through 09/26/11.  the gave her one pen 07/28/11 which is a 33 day supply. Another pen on 08/21/11- another 33 day supply. On 07/07/11 Stephanie Gross brought a letter from [place] that stated that she needed to apply for Medicaid- which to their knowledge she has done. They also may need her 2013 taxes to supply her with more past 09/26/11, but they should have called her and told her that.

## 2011-09-01 NOTE — Telephone Encounter (Signed)
Call from pt.  States [facility] MAP does not have Novolg Flexpens; but has 70/30 in vials.   [facility] has the flexpens but too expensive - $117.00.  And she really does not want to use 70/30. Wanted to know if there's another flexpen she can use - ?

## 2011-09-23 ENCOUNTER — Encounter: Payer: Self-pay | Admitting: Internal Medicine

## 2011-09-23 ENCOUNTER — Ambulatory Visit (INDEPENDENT_AMBULATORY_CARE_PROVIDER_SITE_OTHER): Payer: Self-pay | Admitting: Internal Medicine

## 2011-09-23 VITALS — BP 148/70 | HR 75 | Temp 98.3°F | Ht 65.0 in | Wt 159.7 lb

## 2011-09-23 DIAGNOSIS — E119 Type 2 diabetes mellitus without complications: Secondary | ICD-10-CM

## 2011-09-23 DIAGNOSIS — E785 Hyperlipidemia, unspecified: Secondary | ICD-10-CM

## 2011-09-23 DIAGNOSIS — F17200 Nicotine dependence, unspecified, uncomplicated: Secondary | ICD-10-CM

## 2011-09-23 DIAGNOSIS — I10 Essential (primary) hypertension: Secondary | ICD-10-CM

## 2011-09-23 MED ORDER — ATORVASTATIN CALCIUM 40 MG PO TABS: 40.0000 mg | ORAL_TABLET | ORAL | Status: DC

## 2011-09-23 NOTE — Assessment & Plan Note (Signed)
Emphasized smoking cessation, especially in setting of CAD and elevated cholesterol. Patient reports she has reduced to 4 cigarettes daily and will continue to cut back.

## 2011-09-23 NOTE — Assessment & Plan Note (Addendum)
Lab Results  Component Value Date   NA 143 05/29/2011   K 4.2 05/29/2011   CL 111 05/29/2011   CO2 22 05/29/2011   BUN 20 05/29/2011   CREATININE 1.07 05/29/2011   CREATININE 0.92 08/27/2009    BP Readings from Last 3 Encounters:  09/23/11 148/70  08/27/11 174/74  08/11/11 136/76    Assessment: Hypertension control:  controlled  Progress toward goals:  improved Barriers to meeting goals:  no barriers identified  Plan: Hypertension treatment:  continue current medications Check BMET at next visit.

## 2011-09-23 NOTE — Patient Instructions (Signed)
Please take cholesterol medication once a week. If you tolerate it, please continue taking and ask for a refill. If you cannot tolerate, please call the clinic.  Please follow up in 2.5 months for regular diabetes check up.

## 2011-09-23 NOTE — Assessment & Plan Note (Signed)
Lab Results  Component Value Date   CHOL 283* 08/27/2011   HDL 48 08/27/2011   LDLCALC 200* 08/27/2011   LDLDIRECT 111* 09/15/2008   TRIG 175* 08/27/2011   CHOLHDL 5.9 08/27/2011   LDL is moderately elevated beyond goal of <100. Patient reports she cannot tolerate statins as they make her "sick as a dog." She denies allergic symptoms and that it is more intolerance than allergy. She has tried three different statins in the past. We discussed diet which mainly consists of beef products. Patient states she does not like chicken, but would try veggie burgers instead of beef burgers, as well as eat more fish. We will also retry statin, but at reduced frequency at once a week. Goal is to increase frequency to twice and three times a week if she can tolerate this medication. I hope she is able to afford lipitor, and if she cannot, then would have to try a different agent such as pravastatin or simvastatin at same initial frequency.

## 2011-09-23 NOTE — Progress Notes (Signed)
Patient ID: DYLANEY CRISCIONE, female   DOB: 05-23-1945, 66 y.o.   MRN: [id number] Subjective:      Diabetes   [name] is a 66 year old woman with history of DM II, HLD, HTN, CAD, and cervical radiculopathy who presents for general check up.   1) DM - Patient reports her blood sugars have improved since receiving her insulin. She states MAP program has been running out and that because of this she only gets one pen at a time. She denies abdominal pain, urinary frequency, and does not have any episodes of hypoglycemia.   2) HTN - Is back on lisinopril and HCTZ and tolerating fine. Here for BP recheck.   Patient has no other complaints or concerns today. She denies chest pain, cough, sob, headache, N/V, changes in abdominal and urinary character.   Review of Systems  All other systems reviewed and are negative.       Objective:   Physical Exam  Constitutional: She appears well-developed.  HENT:  Head: Normocephalic.  Eyes: Pupils are equal, round, and reactive to light.  Cardiovascular: Normal rate and regular rhythm.   Pulmonary/Chest: Effort normal and breath sounds normal.  Abdominal: Soft. Bowel sounds are normal.  Musculoskeletal: Normal range of motion.

## 2011-09-23 NOTE — Assessment & Plan Note (Signed)
Lab Results  Component Value Date   HGBA1C 8.3 08/27/2011   HGBA1C 6.7 08/27/2009   CREATININE 1.07 05/29/2011   CREATININE 0.92 08/27/2009   MICROALBUR 16.05* 01/23/2009   MICRALBCREAT 100.0* 01/23/2009   CHOL 283* 08/27/2011   HDL 48 08/27/2011   TRIG 175* 08/27/2011    Last eye exam and foot exam: No results found for this basename: HMDIABEYEEXA, HMDIABFOOTEX    Assessment: Diabetes control: not controlled Progress toward goals: deteriorated Barriers to meeting goals: nonadherence to medications  Plan: Diabetes treatment: continue current medications receiving insulin from MAP, which previously she was not as they were running out of pens early  Refer to: none Instruction/counseling given: reminded to bring blood glucose meter & log to each visit, reminded to bring medications to each visit and discussed the need for weight loss Will check microalb/cr ratio at next visit, as it has not been checked in 3 years. Patient on ACE-I.

## 2011-09-26 ENCOUNTER — Ambulatory Visit: Payer: Self-pay | Admitting: Family Medicine

## 2011-10-03 ENCOUNTER — Ambulatory Visit: Payer: Self-pay | Admitting: Family Medicine

## 2011-10-06 ENCOUNTER — Ambulatory Visit (INDEPENDENT_AMBULATORY_CARE_PROVIDER_SITE_OTHER): Payer: Self-pay | Admitting: Family Medicine

## 2011-10-06 VITALS — BP 148/78

## 2011-10-06 DIAGNOSIS — M5412 Radiculopathy, cervical region: Secondary | ICD-10-CM

## 2011-10-06 DIAGNOSIS — M7500 Adhesive capsulitis of unspecified shoulder: Secondary | ICD-10-CM

## 2011-10-06 NOTE — Progress Notes (Signed)
Patient ID: Stephanie Gross, female   DOB: Jan 25, 1946, 66 y.o.   MRN: [id number]  HPI: Here for f/u of C6 radiculopathy.  Patient was unable to tolerate gabapentin-- once she was up to 3 tab qhs, she was unable to wake up, so she stopped taking it all together.  Does not think it was helping her pain.  Is taking 6 Aleve/day, unsure if they really help, but per pt, "they must do something."  Pain is about the same as last time, still most significant in her left shoulder and arm, especially her upper arm.  Occasionally has weakness when holding things, so she has to put them down (ex bottles).  + night time pain.  Does not feel like pain or weakness are worsening.  Tried asper cream and ice over clavicle for pain, but neither really seem to help.   PE: Filed Vitals:   10/06/11 1516  BP: 148/78   Gen: pleasant, NAD  Left neck/shoulder: Normal grip and bicep strength Normal muscle, w/o signs of atrophy Sensation intact BUE Pain with Neer's, Hawkins, empty can TTP over posterior left shoulder Decreased internal rotation on left compared to right + pain on left cervical area with looking to right  IMAGING REVIEWED incl cervical MRI showing foraminal narrowing left  C6. A/P: left C6 radiculopathy and some generalized left shoulder pain. Has not improved in past w subacromial bursa injection, can not tolerate gabapentin (somnolence at any dose > 300 mg), does not really want to try any more meds. Ultimately she would benefit from NSU eval for her neck---I am not sure this is the cause of ALL of her shoulder pain however. For now she wants to do nothing additional has d/c gabapentin. She will f/ u prn.

## 2011-10-07 NOTE — Assessment & Plan Note (Signed)
eft C6 radiculopathy and some generalized left shoulder pain. Has not improved in past w subacromial bursa injection, can not tolerate gabapentin (somnolence at any dose > 300 mg), does not really want to try any more meds. Ultimately she would benefit from NSU eval for her neck---I am not sure this is the cause of ALL of her shoulder pain however. For now she wants to do nothing additional has d/c gabapentin. She will f/ u prn.

## 2011-10-15 ENCOUNTER — Telehealth: Payer: Self-pay | Admitting: *Deleted

## 2011-10-15 MED ORDER — ATORVASTATIN CALCIUM 40 MG PO TABS: 40.0000 mg | ORAL_TABLET | ORAL | Status: DC

## 2011-10-15 NOTE — Telephone Encounter (Signed)
Read Dr Rossie Muskrat note. Goal was to increase freq so since she tolerated the once weekly, will try twice a week. If she gets sick on that dose, she can return to weekly. Would you pls call in to [facility]

## 2011-10-15 NOTE — Telephone Encounter (Signed)
Pt called - was given 4 Lipitor to try once per week. Pt states no problems. [place] was to call [facility] and report how she did while on Lipitor for next instructions. Hilda Blades Deziah Renwick RN 10/15/11 2:30PM

## 2011-10-15 NOTE — Telephone Encounter (Signed)
Rx called into pharmacy and talked with pt about directions.

## 2011-12-23 ENCOUNTER — Ambulatory Visit (INDEPENDENT_AMBULATORY_CARE_PROVIDER_SITE_OTHER): Payer: Self-pay | Admitting: Internal Medicine

## 2011-12-23 ENCOUNTER — Encounter: Payer: Self-pay | Admitting: Internal Medicine

## 2011-12-23 VITALS — BP 151/79 | HR 77 | Temp 98.5°F | Ht 65.5 in | Wt 159.9 lb

## 2011-12-23 DIAGNOSIS — E119 Type 2 diabetes mellitus without complications: Secondary | ICD-10-CM

## 2011-12-23 DIAGNOSIS — I2510 Atherosclerotic heart disease of native coronary artery without angina pectoris: Secondary | ICD-10-CM

## 2011-12-23 DIAGNOSIS — Z0000 Encounter for general adult medical examination without abnormal findings: Secondary | ICD-10-CM

## 2011-12-23 DIAGNOSIS — Z1211 Encounter for screening for malignant neoplasm of colon: Secondary | ICD-10-CM

## 2011-12-23 DIAGNOSIS — Z79899 Other long term (current) drug therapy: Secondary | ICD-10-CM

## 2011-12-23 DIAGNOSIS — I10 Essential (primary) hypertension: Secondary | ICD-10-CM

## 2011-12-23 DIAGNOSIS — E785 Hyperlipidemia, unspecified: Secondary | ICD-10-CM

## 2011-12-23 DIAGNOSIS — F17200 Nicotine dependence, unspecified, uncomplicated: Secondary | ICD-10-CM

## 2011-12-23 LAB — GLUCOSE, CAPILLARY: Glucose-Capillary: 186 mg/dL — ABNORMAL HIGH (ref 70–99)

## 2011-12-23 LAB — POCT GLYCOSYLATED HEMOGLOBIN (HGB A1C): Hemoglobin A1C: 8.7

## 2011-12-23 MED ORDER — CHLORTHALIDONE 25 MG PO TABS: 25.0000 mg | ORAL_TABLET | Freq: Every day | ORAL | Status: DC

## 2011-12-23 MED ORDER — ATORVASTATIN CALCIUM 40 MG PO TABS: 40.0000 mg | ORAL_TABLET | ORAL | Status: DC

## 2011-12-23 MED ORDER — GLUCOSE BLOOD VI STRP: ORAL_STRIP | Status: DC

## 2011-12-23 MED ORDER — INSULIN GLARGINE 100 UNIT/ML ~~LOC~~ SOLN: 34.0000 [IU] | Freq: Every day | SUBCUTANEOUS | Status: DC

## 2011-12-23 NOTE — Assessment & Plan Note (Signed)
Lab Results  Component Value Date   HGBA1C 8.7 12/23/2011   HGBA1C 6.7 08/27/2009   CREATININE 1.07 05/29/2011   CREATININE 0.92 08/27/2009   MICROALBUR 16.05* 01/23/2009   MICRALBCREAT 100.0* 01/23/2009   CHOL 283* 08/27/2011   HDL 48 08/27/2011   TRIG 175* 08/27/2011    Last eye exam and foot exam: No results found for this basename: HMDIABEYEEXA, HMDIABFOOTEX    Assessment: Diabetes control: not controlled Progress toward goals: deteriorated Barriers to meeting goals: no barriers identified  Plan: Diabetes treatment: Increase lantus from 32U to 34 U at night. Continue SS novolog with meals. Bring in glucometer and readings to visits. Will check Bmet and Microalbumin:Cr today as last check was 3 yrs ago.  Refer to: none Instruction/counseling given: reminded to get eye exam, reminded to bring blood glucose meter & log to each visit, reminded to bring medications to each visit and discussed diet

## 2011-12-23 NOTE — Assessment & Plan Note (Signed)
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 No evidence of unstable angina on history. - Continue to f/u w cardiolgist

## 2011-12-23 NOTE — Assessment & Plan Note (Signed)
Pt does not want screening colonoscopy, but willing to do FOBT cards at home. Understands if + result, will likely need colonoscopy for further work-up. No family history of colon cancer. - FOBT q 3 yrs.

## 2011-12-23 NOTE — Assessment & Plan Note (Signed)
Pt has not escalated up dose of lipitor as previously planned w Dr. Ina Homes 3 months prior, but is tolerating 40mg  twice weekly w no side effects. - Increase dose to Lipitor 40mg  every other day. Will recheck lipid panel at next visit.

## 2011-12-23 NOTE — Patient Instructions (Signed)
1. Please stop taking hydrochlorothiazide and start taking chlorthalidone one tablet daily. 2. Please increase your nighttime lantus to 34 units at bedtime and keep checking your blood sugars. 3. Please start taking your Lipitor one tablet every other day. 4. Please test your stool for blood using the instructions on the cards and mail it back to our clinic.  5. Please inquire about getting over the counter smokeless cigarettes at your pharmacy.   It was a pleasure to meet you. I look forward to seeing you in three months!

## 2011-12-23 NOTE — Assessment & Plan Note (Signed)
Importance of smoking cessation discussed. Pt smoking 6-7 cigarettes per day. Tried chantix in past but made her feel bad. Does not want patch now. Says she will try OTC smokeless cigarettes. - Will reassess at next visit.

## 2011-12-23 NOTE — Progress Notes (Addendum)
Subjective:   Patient ID: Stephanie Gross female   DOB: 04/19/1946 66 y.o.   MRN: [id number]  HPI: [name] is a 66 y.o. female w pmh of CAD s/p DES to RCA in 2007, T2DM, HTN, HLD, and tobacco abuse presenting today for follow up. She has no complaints.  1) T2DM - Pt taking insulin as prescribed (Lantus 32U at night, SS novolog w meals, usually 3-4U). Fasting blood glucoses 130-180, postprandial BG in 200s. Admits she is not adhering well to diet that she discussed w Debera Lat, but is aware of what she should be eating. Denies sx of hypoglycemia including palpitations, sweating, N/V, dizziness. Up to date w foot and ophtho exams.  2) HTN - Pt is concerned that BP persistently elevated despite max ACE-i and HCTZ therapy. Also on metoprolol and imdur for CAD. Compliant with medications. Denies headache, blurred vision. 3) Hyperlipidemia - Pt w intolerance to statin in past, can't remember reaction. Since last visit w Dr. Ina Homes, has been taking Lipitor twice weekly and was going to self-titrate up but never did. She has not had any SE from the medication.  4) CAD - [facility], last appt in 07/2011. Was instructed to continue plavix for antiplatelet therapy. 5) Tobacco abuse - Smokes 6-7 cigarettes per day, says she has tried chantix before but didn't like the way it made her feel. Is not interest in patch, but would be willing to try smokeless cigarettes.     Past Medical History  Diagnosis Date  . CAD (coronary artery disease)     s/p RCA stent 2007. Myoview 2010 normal  . DM (diabetes mellitus)   . HTN (hypertension)   . Tobacco use disorder   . Hyperlipidemia    Current Outpatient Prescriptions  Medication Sig Dispense Refill  . atorvastatin (LIPITOR) 40 MG tablet Take 1 tablet (40 mg total) by mouth 2 (two) times a week.  30 tablet  11  . esomeprazole (NEXIUM) 20 MG capsule Take 1 capsule (20 mg total) by mouth daily before breakfast.  90 capsule  3    . fish oil-omega-3 fatty acids 1000 MG capsule Take 1 g by mouth 3 (three) times daily.        . hydrochlorothiazide (HYDRODIURIL) 25 MG tablet Take 1 tablet (25 mg total) by mouth daily.  30 tablet  11  . insulin aspart (NOVOLOG FLEXPEN) 100 UNIT/ML injection Per Dr Rossie Muskrat instructions  15 pen  3  . insulin glargine (LANTUS SOLOSTAR) 100 UNIT/ML injection Inject 27 Units into the skin at bedtime.  45 mL  3  . isosorbide mononitrate (IMDUR) 30 MG 24 hr tablet Take 1 tablet (30 mg total) by mouth daily.  90 tablet  3  . lisinopril (PRINIVIL,ZESTRIL) 40 MG tablet Take 1 tablet (40 mg total) by mouth daily.  30 tablet  3  . metoprolol succinate (TOPROL-XL) 50 MG 24 hr tablet Take 1 tab twice a day       No family history on file. History   Social History  . Marital Status: Single    Spouse Name: N/A    Number of Children: N/A  . Years of Education: N/A   Occupational History  . unemployed    Social History Main Topics  . Smoking status: Current Everyday Smoker -- 0.4 packs/day    Types: Cigarettes  . Smokeless tobacco: Never Used  . Alcohol Use: No  . Drug Use: No  . Sexually Active: None   Other Topics Concern  .  None   Social History Narrative   Patient recently lost her job and is currently without insurance. - Nov 2012   Review of Systems: Constitutional: Denies fever, chills, diaphoresis, appetite change and fatigue.  HEENT: Denies photophobia, eye pain, redness, hearing loss, ear pain, congestion, sore throat, rhinorrhea, sneezing, mouth sores, trouble swallowing. Respiratory: Denies SOB, DOE, cough, chest tightness,  and wheezing.   Cardiovascular: Denies chest pain, palpitations and leg swelling.  Gastrointestinal: Denies nausea, vomiting, abdominal pain, diarrhea, constipation, blood in stool and abdominal distention.  Genitourinary: Denies dysuria, urgency, frequency, hematuria, flank pain and difficulty urinating.  Musculoskeletal: + Bilateral shoulder joint  tenderness. Denies myalgias, back pain, joint swelling, numbness, weakness. Skin: Denies pallor, rash and wound.  Neurological: Denies dizziness, seizures, syncope, weakness, light-headedness, numbness and headaches.  Hematological: Denies adenopathy or easy brooding Psychiatric/Behavioral: Denies mood changes.  Objective:  Physical Exam: Filed Vitals:   12/23/11 1343  BP: 151/79  Pulse: 77  Temp: 98.5 F (36.9 C)  TempSrc: Oral  Height: 5' 5.5" (1.664 m)  Weight: 159 lb 14.4 oz (72.53 kg)  SpO2: 96%   Constitutional: Vital signs reviewed.  Patient is a well-developed and well-nourished female in no acute distress and cooperative with exam. Alert and oriented x3.  Head: Normocephalic and atraumatic Ear: TM partially obscured by cerumen bilaterally, no effusions visualized  Mouth: no erythema or exudates, MMM Eyes: PERRL, EOMI, conjunctivae normal, No scleral icterus.  Neck: Supple, Trachea midline normal ROM, No JVD, mass, thyromegaly, or carotid bruit present.  Cardiovascular: RRR, S1 normal, S2 normal, no MRG, pulses symmetric and intact bilaterally Pulmonary/Chest: CTAB, no wheezes, rales, or rhonchi Abdominal: Soft. Non-tender, non-distended, bowel sounds are normal, no masses, organomegaly, or guarding present.  GU: no CVA tenderness Musculoskeletal: Mild tenderness w active ROM of bilateral shoulder joints.  No joint deformities, erythema, or weakness.  Hematology: no cervical, inginal, or axillary adenopathy.  Neurological: A&O x3, Strength is normal and symmetric bilaterally, cranial nerve II-XII are grossly intact, no focal motor deficit, sensory intact to light touch bilaterally.  Skin: Warm, dry and intact. No rash, cyanosis, or clubbing.  Psychiatric: Normal mood and affect. speech and behavior is normal. Judgment and thought content normal. Cognition and memory are normal.   Assessment & Plan:   INTERNAL MEDICINE TEACHING ATTENDING ADDENDUM - Stephanie Quiet, MD: I  personally saw and evaluated Stephanie Gross in this clinic visit in conjunction with the resident, Dr. Sandy Salaam. I have discussed the patient's plan of care with Dr. Sandy Salaam during this visit. I have confirmed the physical exam findings and have read and agree with the clinic note including the plan.

## 2011-12-23 NOTE — Assessment & Plan Note (Signed)
Lab Results  Component Value Date   NA 143 05/29/2011   K 4.2 05/29/2011   CL 111 05/29/2011   CO2 22 05/29/2011   BUN 20 05/29/2011   CREATININE 1.07 05/29/2011   CREATININE 0.92 08/27/2009    BP Readings from Last 3 Encounters:  12/23/11 151/79  10/06/11 148/78  09/23/11 148/70    Assessment: Hypertension control:  moderately elevated  Progress toward goals:  deteriorated Barriers to meeting goals:  no barriers identified  Plan: Hypertension treatment:  Pt maxed out ACE and HCTZ. Also on metoprolol and imdur for h/o CAD. Will d/c HCTZ and switch to chlorthalidone 25mg  daily, as longer 1/2 life and may have more potent BP effect. If no difference at next appt, consider adding norvasc. Will check [facility] today.

## 2011-12-24 LAB — MICROALBUMIN / CREATININE URINE RATIO
Creatinine, Urine: 155.4 mg/dL
Microalb Creat Ratio: 68.9 mg/g — ABNORMAL HIGH (ref 0.0–30.0)
Microalb, Ur: 10.71 mg/dL — ABNORMAL HIGH (ref 0.00–1.89)

## 2011-12-24 LAB — BASIC METABOLIC PANEL WITH GFR
CO2: 25 mEq/L (ref 19–32)
Chloride: 109 mEq/L (ref 96–112)
GFR, Est Non African American: 50 mL/min — ABNORMAL LOW
Potassium: 4.6 mEq/L (ref 3.5–5.3)

## 2012-01-01 ENCOUNTER — Telehealth: Payer: Self-pay | Admitting: *Deleted

## 2012-01-01 ENCOUNTER — Other Ambulatory Visit (INDEPENDENT_AMBULATORY_CARE_PROVIDER_SITE_OTHER): Payer: Self-pay

## 2012-01-01 DIAGNOSIS — Z1211 Encounter for screening for malignant neoplasm of colon: Secondary | ICD-10-CM

## 2012-01-01 LAB — POC HEMOCCULT BLD/STL (HOME/3-CARD/SCREEN): Fecal Occult Blood, POC: NEGATIVE

## 2012-01-01 NOTE — Telephone Encounter (Signed)
Only returned 1 stool card without time of collection or date.  Dr. Recommended 3 cards only 1 sent back.  Sander Nephew, RN 01/01/2012 10:57 AM

## 2012-01-13 ENCOUNTER — Other Ambulatory Visit (INDEPENDENT_AMBULATORY_CARE_PROVIDER_SITE_OTHER): Payer: Self-pay

## 2012-01-13 DIAGNOSIS — Z1211 Encounter for screening for malignant neoplasm of colon: Secondary | ICD-10-CM

## 2012-01-13 LAB — POC HEMOCCULT BLD/STL (HOME/3-CARD/SCREEN)
Card #2 Fecal Occult Blod, POC: NEGATIVE
Card #3 Fecal Occult Blood, POC: NEGATIVE
Fecal Occult Blood, POC: NEGATIVE

## 2012-01-20 ENCOUNTER — Other Ambulatory Visit: Payer: Self-pay | Admitting: Internal Medicine

## 2012-01-21 ENCOUNTER — Other Ambulatory Visit: Payer: Self-pay | Admitting: *Deleted

## 2012-01-21 DIAGNOSIS — I10 Essential (primary) hypertension: Secondary | ICD-10-CM

## 2012-01-22 ENCOUNTER — Ambulatory Visit: Payer: Self-pay | Admitting: Internal Medicine

## 2012-01-22 MED ORDER — LISINOPRIL 40 MG PO TABS: 40.0000 mg | ORAL_TABLET | Freq: Every day | ORAL | Status: DC

## 2012-02-06 ENCOUNTER — Telehealth: Payer: Self-pay | Admitting: *Deleted

## 2012-02-06 NOTE — Telephone Encounter (Signed)
Called pt back and she denies any speech problems.  Denies weakness or any other problems. Pt asked to go to ED for evaluation as we are closed for the weekend and numbness has not resolved since 1:00 today. Pt voices understanding.

## 2012-02-06 NOTE — Telephone Encounter (Signed)
Pt called with c/o numbness to rt side of mouth.  Top, bottom lip and tongue is like she had Novocain. Also top of fingers on fr hand are numb. Onset 1:00 today. Pt # [id number]

## 2012-02-06 NOTE — Telephone Encounter (Signed)
Patient should go to ER if her symptoms become continue or become worse. It seems that she does not have speech abnormality, weakness, problems with walking or vision problems.

## 2012-02-07 ENCOUNTER — Emergency Department (HOSPITAL_COMMUNITY): Payer: Self-pay

## 2012-02-07 ENCOUNTER — Encounter (HOSPITAL_COMMUNITY): Payer: Self-pay | Admitting: *Deleted

## 2012-02-07 ENCOUNTER — Observation Stay (HOSPITAL_COMMUNITY): Payer: Self-pay

## 2012-02-07 ENCOUNTER — Other Ambulatory Visit (HOSPITAL_COMMUNITY): Payer: Self-pay

## 2012-02-07 ENCOUNTER — Encounter (HOSPITAL_COMMUNITY): Payer: Self-pay | Admitting: Emergency Medicine

## 2012-02-07 ENCOUNTER — Observation Stay (HOSPITAL_COMMUNITY)
Admission: EM | Admit: 2012-02-07 | Discharge: 2012-02-08 | Disposition: A | Discharge status: Home / Self Care | Payer: Self-pay | Attending: Internal Medicine | Admitting: Internal Medicine

## 2012-02-07 ENCOUNTER — Other Ambulatory Visit: Payer: Self-pay

## 2012-02-07 ENCOUNTER — Emergency Department (INDEPENDENT_AMBULATORY_CARE_PROVIDER_SITE_OTHER)
Admission: EM | Admit: 2012-02-07 | Discharge: 2012-02-07 | Disposition: A | Discharge status: Nursing Home (Includes ICF) | Payer: Self-pay | Source: Home / Self Care | Attending: Emergency Medicine | Admitting: Emergency Medicine

## 2012-02-07 DIAGNOSIS — E119 Type 2 diabetes mellitus without complications: Secondary | ICD-10-CM | POA: Insufficient documentation

## 2012-02-07 DIAGNOSIS — I10 Essential (primary) hypertension: Secondary | ICD-10-CM | POA: Diagnosis present

## 2012-02-07 DIAGNOSIS — I639 Cerebral infarction, unspecified: Secondary | ICD-10-CM

## 2012-02-07 DIAGNOSIS — R209 Unspecified disturbances of skin sensation: Secondary | ICD-10-CM | POA: Insufficient documentation

## 2012-02-07 DIAGNOSIS — E785 Hyperlipidemia, unspecified: Secondary | ICD-10-CM | POA: Diagnosis present

## 2012-02-07 DIAGNOSIS — I6350 Cerebral infarction due to unspecified occlusion or stenosis of unspecified cerebral artery: Secondary | ICD-10-CM

## 2012-02-07 DIAGNOSIS — M79609 Pain in unspecified limb: Secondary | ICD-10-CM | POA: Insufficient documentation

## 2012-02-07 DIAGNOSIS — Z7901 Long term (current) use of anticoagulants: Secondary | ICD-10-CM | POA: Insufficient documentation

## 2012-02-07 DIAGNOSIS — E1110 Type 2 diabetes mellitus with ketoacidosis without coma: Secondary | ICD-10-CM | POA: Diagnosis present

## 2012-02-07 DIAGNOSIS — I2510 Atherosclerotic heart disease of native coronary artery without angina pectoris: Secondary | ICD-10-CM | POA: Diagnosis present

## 2012-02-07 DIAGNOSIS — G609 Hereditary and idiopathic neuropathy, unspecified: Secondary | ICD-10-CM | POA: Diagnosis present

## 2012-02-07 HISTORY — DX: Gastro-esophageal reflux disease without esophagitis: K21.9

## 2012-02-07 LAB — CBC WITH DIFFERENTIAL/PLATELET
Basophils Relative: 1 % (ref 0–1)
Eosinophils Absolute: 0.2 10*3/uL (ref 0.0–0.7)
Eosinophils Relative: 2 % (ref 0–5)
HCT: 40.1 % (ref 36.0–46.0)
Hemoglobin: 13.1 g/dL (ref 12.0–15.0)
MCH: 29.3 pg (ref 26.0–34.0)
MCHC: 32.7 g/dL (ref 30.0–36.0)
MCV: 89.7 fL (ref 78.0–100.0)
Monocytes Absolute: 0.7 10*3/uL (ref 0.1–1.0)
Monocytes Relative: 6 % (ref 3–12)

## 2012-02-07 LAB — URINALYSIS, ROUTINE W REFLEX MICROSCOPIC
Bilirubin Urine: NEGATIVE
Nitrite: NEGATIVE
Specific Gravity, Urine: 1.015 (ref 1.005–1.030)
Urobilinogen, UA: 0.2 mg/dL (ref 0.0–1.0)

## 2012-02-07 LAB — COMPREHENSIVE METABOLIC PANEL
Albumin: 4 g/dL (ref 3.5–5.2)
BUN: 18 mg/dL (ref 6–23)
Chloride: 109 mEq/L (ref 96–112)
Creatinine, Ser: 0.97 mg/dL (ref 0.50–1.10)
Total Bilirubin: 0.2 mg/dL — ABNORMAL LOW (ref 0.3–1.2)
Total Protein: 8 g/dL (ref 6.0–8.3)

## 2012-02-07 LAB — GLUCOSE, CAPILLARY: Glucose-Capillary: 179 mg/dL — ABNORMAL HIGH (ref 70–99)

## 2012-02-07 LAB — PROTIME-INR
INR: 0.9 (ref 0.00–1.49)
Prothrombin Time: 12.1 seconds (ref 11.6–15.2)

## 2012-02-07 MED ORDER — ISOSORBIDE MONONITRATE ER 30 MG PO TB24
30.0000 mg | ORAL_TABLET | Freq: Every day | ORAL | Status: DC
  Administered 2012-02-07 – 2012-02-08 (×2): 30 mg via ORAL
  Filled 2012-02-07 (×2): qty 1

## 2012-02-07 MED ORDER — ACETAMINOPHEN 325 MG PO TABS: 650.0000 mg | ORAL_TABLET | ORAL | Status: DC | PRN

## 2012-02-07 MED ORDER — ONDANSETRON HCL 4 MG/2ML IJ SOLN: 4.0000 mg | Freq: Four times a day (QID) | INTRAMUSCULAR | Status: DC | PRN

## 2012-02-07 MED ORDER — METOPROLOL SUCCINATE ER 50 MG PO TB24
50.0000 mg | ORAL_TABLET | Freq: Two times a day (BID) | ORAL | Status: DC
  Administered 2012-02-07 – 2012-02-08 (×2): 50 mg via ORAL
  Filled 2012-02-07 (×3): qty 1

## 2012-02-07 MED ORDER — SODIUM CHLORIDE 0.9 % IV SOLN
Freq: Once | INTRAVENOUS | Status: AC
  Administered 2012-02-07: 10:00:00 via INTRAVENOUS

## 2012-02-07 MED ORDER — INSULIN GLARGINE 100 UNIT/ML ~~LOC~~ SOLN
34.0000 [IU] | Freq: Every day | SUBCUTANEOUS | Status: DC
  Administered 2012-02-07: 34 [IU] via SUBCUTANEOUS

## 2012-02-07 MED ORDER — LISINOPRIL 40 MG PO TABS
40.0000 mg | ORAL_TABLET | Freq: Every day | ORAL | Status: DC
  Administered 2012-02-07 – 2012-02-08 (×2): 40 mg via ORAL
  Filled 2012-02-07 (×2): qty 1

## 2012-02-07 MED ORDER — CHLORTHALIDONE 25 MG PO TABS
25.0000 mg | ORAL_TABLET | Freq: Every day | ORAL | Status: DC
  Administered 2012-02-07 – 2012-02-08 (×2): 25 mg via ORAL
  Filled 2012-02-07 (×2): qty 1

## 2012-02-07 MED ORDER — ASPIRIN 325 MG PO TABS
325.0000 mg | ORAL_TABLET | Freq: Every day | ORAL | Status: DC
  Administered 2012-02-07 – 2012-02-08 (×2): 325 mg via ORAL
  Filled 2012-02-07 (×2): qty 1

## 2012-02-07 MED ORDER — HYDRALAZINE HCL 20 MG/ML IJ SOLN
10.0000 mg | Freq: Four times a day (QID) | INTRAMUSCULAR | Status: DC | PRN
  Filled 2012-02-07: qty 0.5

## 2012-02-07 MED ORDER — SODIUM CHLORIDE 0.9 % IV SOLN: INTRAVENOUS | Status: DC

## 2012-02-07 MED ORDER — INSULIN ASPART 100 UNIT/ML ~~LOC~~ SOLN: 3.0000 [IU] | Freq: Three times a day (TID) | SUBCUTANEOUS | Status: DC

## 2012-02-07 MED ORDER — INSULIN ASPART 100 UNIT/ML ~~LOC~~ SOLN
0.0000 [IU] | Freq: Three times a day (TID) | SUBCUTANEOUS | Status: DC
  Administered 2012-02-07: 4 [IU] via SUBCUTANEOUS

## 2012-02-07 MED ORDER — OMEGA-3 FATTY ACIDS 1000 MG PO CAPS: 1.0000 g | ORAL_CAPSULE | Freq: Three times a day (TID) | ORAL | Status: DC

## 2012-02-07 MED ORDER — PANTOPRAZOLE SODIUM 40 MG PO TBEC
40.0000 mg | DELAYED_RELEASE_TABLET | Freq: Every day | ORAL | Status: DC
  Administered 2012-02-07 – 2012-02-08 (×2): 40 mg via ORAL
  Filled 2012-02-07 (×2): qty 1

## 2012-02-07 MED ORDER — OMEGA-3-ACID ETHYL ESTERS 1 G PO CAPS
1.0000 g | ORAL_CAPSULE | Freq: Three times a day (TID) | ORAL | Status: DC
  Administered 2012-02-07 – 2012-02-08 (×3): 1 g via ORAL
  Filled 2012-02-07 (×5): qty 1

## 2012-02-07 MED ORDER — CLOPIDOGREL BISULFATE 75 MG PO TABS
75.0000 mg | ORAL_TABLET | Freq: Every day | ORAL | Status: DC
  Administered 2012-02-07 – 2012-02-08 (×2): 75 mg via ORAL
  Filled 2012-02-07: qty 1

## 2012-02-07 MED ORDER — ENOXAPARIN SODIUM 40 MG/0.4ML ~~LOC~~ SOLN
40.0000 mg | SUBCUTANEOUS | Status: DC
  Administered 2012-02-07: 40 mg via SUBCUTANEOUS
  Filled 2012-02-07 (×2): qty 0.4

## 2012-02-07 MED ORDER — INFLUENZA VIRUS VACC SPLIT PF IM SUSP
0.5000 mL | INTRAMUSCULAR | Status: DC
  Filled 2012-02-07: qty 0.5

## 2012-02-07 NOTE — ED Notes (Signed)
Pt to ED from [facility] via [facility] for numbness to right side of lips and tongue and right fingertips (2nd through 5th).  Also c/o left leg pain.  Reports onset as yesterday afternoon and called her MD's office.  Told to come to ED, but didn't.

## 2012-02-07 NOTE — ED Notes (Signed)
Report given to [place] Calix.

## 2012-02-07 NOTE — ED Provider Notes (Signed)
History     CSN: Stephanie Gross:[id number]  Arrival date & time 02/07/12  1016   First MD Initiated Contact with Patient 02/07/12 1045      Chief Complaint  Patient presents with  . Numbness    right tongue/ lip and right fingertips  . Leg Pain    left leg    (Consider location/radiation/quality/duration/timing/severity/associated sxs/prior treatment) HPI  66 year old female with past medical history significant for insulin-dependent diabetes, coronary artery disease, hypertension, hypercholesterolemia and active tobacco use (half pack per day) complaining of decreased sensation to right upper and lower lip onset yesterday at 11 AM patient also reports a sensation of numbness to the tips of the right second through fifth digits. She denies dysarthria, change in vision, ataxia, headache, or weakness, patient does report a fleeting sensation of pain to the left leg now resolved. Patient is taking Plavix for MI with stent placement approximately 7 years ago. Patient was advised to present to the ED 2 days ago by her primary care Dr. Dr. Nydia Bouton her, patient did not present at that time she went to urgent care this morning and was sent to the emergency room.  PCP Leland Johns  Past Medical History  Diagnosis Date  . CAD (coronary artery disease)     s/p RCA stent 2007. Myoview 2010 normal  . DM (diabetes mellitus)   . HTN (hypertension)   . Tobacco use disorder   . Hyperlipidemia   . GERD (gastroesophageal reflux disease)     Past Surgical History  Procedure Date  . Coronary angioplasty with stent placement   . Abdominal hysterectomy   . Shoulder surgery     History reviewed. No pertinent family history.  History  Substance Use Topics  . Smoking status: Current Every Day Smoker -- 0.5 packs/day    Types: Cigarettes  . Smokeless tobacco: Never Used  . Alcohol Use: No    OB History    Grav Para Term Preterm Abortions TAB SAB Ect Mult Living                  Review of Systems    Constitutional: Negative for fever.  Respiratory: Negative for shortness of breath.   Cardiovascular: Negative for chest pain.  Gastrointestinal: Negative for nausea, vomiting, abdominal pain and diarrhea.  Neurological: Positive for numbness. Negative for seizures, syncope, facial asymmetry, speech difficulty, weakness, light-headedness and headaches.  All other systems reviewed and are negative.    Allergies  Statins and Codeine  Home Medications   Current Outpatient Rx  Name Route Sig Dispense Refill  . CHLORTHALIDONE 25 MG PO TABS Oral Take 1 tablet (25 mg total) by mouth daily. 30 tablet 4  . CLOPIDOGREL BISULFATE 75 MG PO TABS Oral Take 75 mg by mouth daily.    Marland Kitchen ESOMEPRAZOLE MAGNESIUM 20 MG PO CPDR Oral Take 1 capsule (20 mg total) by mouth daily before breakfast. 90 capsule 3  . OMEGA-3 FATTY ACIDS 1000 MG PO CAPS Oral Take 1 g by mouth 3 (three) times daily.      . INSULIN ASPART 100 UNIT/ML Bent SOLN Subcutaneous Inject 3-4 Units into the skin 3 (three) times daily before meals. Per sliding scale    . INSULIN GLARGINE 100 UNIT/ML  SOLN Subcutaneous Inject 34 Units into the skin at bedtime. 45 mL 3  . ISOSORBIDE MONONITRATE ER 30 MG PO TB24 Oral Take 1 tablet (30 mg total) by mouth daily. 90 tablet 3  . LISINOPRIL 40 MG PO TABS Oral Take 1  tablet (40 mg total) by mouth daily. 30 tablet 5  . METOPROLOL SUCCINATE ER 50 MG PO TB24 Oral Take 50 mg by mouth 2 (two) times daily.       BP 178/75  Pulse 60  Temp 98 F (36.7 C) (Oral)  Resp 16  Ht 5\' 5"  (1.651 m)  Wt 135 lb (61.236 kg)  BMI 22.47 kg/m2  SpO2 99%  Physical Exam  Nursing note and vitals reviewed. Constitutional: She is oriented to person, place, and time. She appears well-developed and well-nourished. No distress.  HENT:  Head: Normocephalic and atraumatic.  Mouth/Throat: Oropharynx is clear and moist.  Eyes: Conjunctivae normal and EOM are normal. Pupils are equal, round, and reactive to light.  Neck:  Normal range of motion.  Cardiovascular: Normal rate, regular rhythm and intact distal pulses.   Pulmonary/Chest: Effort normal and breath sounds normal. No stridor. No respiratory distress. She has no wheezes. She has no rales. She exhibits no tenderness.  Abdominal: Soft. Bowel sounds are normal. She exhibits no mass. There is no tenderness. There is no rebound.  Musculoskeletal: Normal range of motion.  Neurological: She is alert and oriented to person, place, and time.       Cranial nerves III through XII intact, strength 5 out of 5x4 extremities, negative pronator drift, finger to nose and heel-to-shin coordinated, sensation intact to pinprick and light touch, gait is coordinated and Romberg is negative.   Psychiatric: She has a normal mood and affect.    ED Course  Procedures (including critical care time)  Labs Reviewed  CBC WITH DIFFERENTIAL - Abnormal; Notable for the following:    WBC 11.8 (*)     All other components within normal limits  COMPREHENSIVE METABOLIC PANEL - Abnormal; Notable for the following:    Calcium 10.8 (*)     Alkaline Phosphatase 122 (*)     Total Bilirubin 0.2 (*)     GFR calc non Af Amer 60 (*)     GFR calc Af Amer 70 (*)     All other components within normal limits  PROTIME-INR  URINALYSIS, ROUTINE W REFLEX MICROSCOPIC   Ct Head Wo Contrast  02/07/2012  *RADIOLOGY REPORT*  Clinical Data: Right facial and upper extremity numbness. Diabetes, hypertension, hypercholesterolemia, and coronary artery disease.  CT HEAD WITHOUT CONTRAST  Technique:  Contiguous axial images were obtained from the base of the skull through the vertex without contrast.  Comparison: None.  Findings: There is no evidence of intracranial hemorrhage, brain edema or other signs of acute infarction.  There is no evidence of intracranial mass lesion or mass effect.  No abnormal extra-axial fluid collections are identified.  Moderate chronic small vessel disease is demonstrated.  Old  thalamic lacunes are seen bilaterally.  Ventricles are normal in size.  No skull abnormality identified.  IMPRESSION:  1.  No acute intracranial abnormality. 2.  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   Original Report Authenticated By: Marlaine Hind, M.D.     Date: 02/07/2012  Rate: 68  Rhythm: normal sinus rhythm  QRS Axis: normal  Intervals: normal  ST/T Wave abnormalities: normal  Conduction Disutrbances:none  Narrative Interpretation: LVH  Old EKG Reviewed: unchanged  1. HYPERTENSION   2. Type II or unspecified type diabetes mellitus without mention of complication, not stated as uncontrolled   3. DIABETES MELLITUS, TYPE II       MDM  66 y.o. female with 24 hours of paresthesia to right side of  both upper and lower lip with similar numbness to tips of right second through fifth digit. Neurological exam is normal. Blood work is unremarkable Patient has multiple risk factors for stroke including insulin-dependent diabetes, CAD, hypertension, current smoker. Symptoms are ongoing. CT is normal however I think it is reasonable to admit the patient for observation and further testing.  Patient will be admitted under the care of Dr. Larena Sox to a telemetry bed [facility] team 6.        Monico Blitz, PA-C 02/07/12 1542

## 2012-02-07 NOTE — Progress Notes (Signed)
VASCULAR LAB PRELIMINARY  PRELIMINARY  PRELIMINARY  PRELIMINARY  Carotid Dopplers completed.    Preliminary report:  No ICA stenosis.  Vertebral artery flow is antegrade.  Fareed Fung, 02/07/2012, 2:30 PM

## 2012-02-07 NOTE — Progress Notes (Signed)
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 Pincus Sanes, PA  paged regarding result around 2002. Vital signs changed to q2hr. No additional orders given.  Elspeth Cho, RN 02/07/12 2009

## 2012-02-07 NOTE — ED Provider Notes (Signed)
Chief Complaint  Patient presents with  . Numbness    History of Present Illness:   The patient is a 66 year old female with multiple risk factors including diabetes, coronary artery disease, hypertension, hypercholesterolemia, and tobacco use who presents today with a history since 101 AM yesterday of numbness of the right upper and lower lips and the 4 fingers on the right hand. She also thinks her left leg for a little bit numb today. She's been slightly unsteady in her gait. She denies any headache, stiff neck, but diplopia, blurry vision, difficulty with speech or swallowing, numbness of the right leg, weakness of the face, arm, or leg. She has not had symptoms like this before and denies any history of stroke or TIAs. She had a stent placed 6-7 years ago and is on Plavix following that. She's been diabetic since she was in her 24s. Most recent blood sugar was 111 yesterday and she takes insulin for that. She smokes one half pack of cigarettes per day. She denies any chest pain, tightness, pressure, shortness of breath, palpitations, or syncope.  Review of Systems:  Other than noted above, the patient denies any of the following symptoms: Systemic:  No fever, chills, fatigue, photophobia, stiff neck. Eye:  No redness, eye pain, discharge, blurred vision, or diplopia. ENT:  No nasal congestion, rhinorrhea, sinus pressure or pain, sneezing, earache, or sore throat.  No jaw claudication. Neuro:  No paresthesias, loss of consciousness, seizure activity, muscle weakness, trouble with coordination or gait, trouble speaking or swallowing. Psych:  No depression, anxiety or trouble sleeping.  [place]:  Past medical history, family history, social history, meds, and allergies were reviewed.  Physical Exam:   Vital signs:  BP 207/68  Pulse 67  Temp 97.9 F (36.6 C) (Oral)  Resp 18  SpO2 100% General:  Alert and oriented.  In no distress. Eye:  Lids and conjunctivas normal.  PERRL,  Full EOMs.  Fundi  benign with normal discs and vessels. ENT:  No cranial or facial tenderness to palpation.  TMs and canals clear.  Nasal mucosa was normal and uncongested without any drainage. No intra oral lesions, pharynx clear, mucous membranes moist, dentition normal. Neck:  Supple, full ROM, no tenderness to palpation.  No adenopathy or mass. No carotid bruit. Lungs: Clear to auscultation. Heart: Regular rhythm, no gallop or murmur. Neuro:  Alert and orented times 3.  Speech was clear, fluent, and appropriate.  Cranial nerves intact. No pronator drift, muscle strength normal. Finger to nose normal.  DTRs were 2+ and symmetrical.Station and gait were normal.  Romberg's sign was normal.  Able to perform tandem gait well. Psych:  Normal affect.   Date: 02/07/2012  Rate: 68  Rhythm: normal sinus rhythm  QRS Axis: normal  Intervals: normal  ST/T Wave abnormalities: normal  Conduction Disutrbances:none  Narrative Interpretation: Normal sinus rhythm, LVH, left atrial enlargement, otherwise normal.  Old EKG Reviewed: none available  Results for orders placed during the hospital encounter of 02/07/12  GLUCOSE, CAPILLARY      Component Value Range   Glucose-Capillary 82  70 - 99 mg/dL    Course in Urgent [place]:   IV normal saline was started and she was placed on a monitor and given oxygen. She'll be transported to the emergency department via [place].  Assessment:  The encounter diagnosis was CVA (cerebral infarction).  This is not a code stroke a sense symptoms have been going on since 11 AM yesterday.  Plan:   1.  The following meds were prescribed:   New Prescriptions   No medications on file   2.  The patient was transported to the emergency department in stable condition via [place].  Harden Mo, MD 02/07/12 [phone]

## 2012-02-07 NOTE — ED Notes (Signed)
Attempt to call report to ED Charge RN without success; report given to Sanda Klein, ED RN.

## 2012-02-07 NOTE — H&P (Signed)
[facility] History and Physical  Stephanie Gross [id number] DOB: Jan 29, 1946 DOA: 02/07/2012  Referring physician: Harden Mo, MD  PCP: Tonia Brooms, MD   Chief Complaint:Numbness    HPI:  66 year old female with multiple risk factors including smoking, diabetes, coronary artery disease, hypertension, hypercholesterolemia, and tobacco use who presents today with a history since 71 AM yesterday of numbness in the tip of the fingers or third right upper extremity and periorbital numbness, and left leg numbness and heaviness. She has noted gait imbalance since yesterday. She also thinks her left leg for a little bit numb today.   She denies any headache, stiff neck, but diplopia, blurry vision, difficulty with speech or swallowing, numbness of the right leg, weakness of the face, arm, or leg. She has not had symptoms like this before and denies any history of stroke or TIAs. She had a stent placed 6-7 years ago and is on Plavix following that. She's been diabetic since she was in her 66s. Most recent blood sugar was 111 yesterday and she takes insulin for that. She smokes one half pack of cigarettes per day. She denies any chest pain, tightness, pressure, shortness of breath, palpitations, or syncope.       Review of Systems: negative for the following   Constitutional: Chills, Fever, Unexplained weight loss, Unexplained recent fatigue, Change in appetite  Eyes: Blurred vision, redness in eyes, pain in eyes  Ears, Nose, Mouth, Throat: Unexplained decreased hearing, sore throat swollen glands  Cardiovascular: Chest pain, irregular heartbeat, frequent dizziness upon standing up, shortness of breath lying down  Respiratory: Cough, wheezing, shortness of breath   Gastrointestinal: Moderate to severe abdominal pain, diarrhea, blood in stool   Genitourinary: Blood in urine, difficulty urinating. Women only, change in menstrual period, irregular menstrual  periods, excessively heavy bleeding   Musculoskeletal: Arthritis, abnormal weakness of muscles   Breasts: New breast lumps, nipple discharge or irritation   Neurological: Dizziness, positive for numbing/tingling, loss of balance when walking   Psychiatric: Depression, mood swings, significant and unexplained memory loss  Endocrine: Excessively hot or cold, unexplained weight loss or gain   Hematologic/Lymphatic: Swollen glands, easy bruising   Allergic/Immunologic: Hives, runny nose, sneezing      Past Medical History  Diagnosis Date  . CAD (coronary artery disease)     s/p RCA stent 2007. Myoview 2010 normal  . DM (diabetes mellitus)   . HTN (hypertension)   . Tobacco use disorder   . Hyperlipidemia   . GERD (gastroesophageal reflux disease)      Past Surgical History  Procedure Date  . Coronary angioplasty with stent placement   . Abdominal hysterectomy   . Shoulder surgery       Social History:  reports that she has been smoking Cigarettes.  She has been smoking about .5 packs per day. She has never used smokeless tobacco. She reports that she does not drink alcohol or use illicit drugs. She lives at home with her husband  Family history FAMILY HISTORY: Her mother has diabetes and her father has hypertension,  but neither one have heart disease. She has no siblings with heart disease   Allergies  Allergen Reactions  . Statins Other (See Comments)    Patient feels like she is "being pulled to the ground"  She doesn't want to take these medications.  . Codeine Nausea And Vomiting    History reviewed. No pertinent family history.   Prior to Admission medications   Medication Sig Start Date End  Date Taking? Authorizing Provider  chlorthalidone (HYGROTON) 25 MG tablet Take 1 tablet (25 mg total) by mouth daily. 12/23/11 12/22/12 Yes Tonia Brooms, MD  clopidogrel (PLAVIX) 75 MG tablet Take 75 mg by mouth daily.   Yes Historical Provider, MD  esomeprazole (NEXIUM)  20 MG capsule Take 1 capsule (20 mg total) by mouth daily before breakfast. 06/30/11  Yes Amanjot Sidhu, MD  fish oil-omega-3 fatty acids 1000 MG capsule Take 1 g by mouth 3 (three) times daily.     Yes Historical Provider, MD  insulin aspart (NOVOLOG) 100 UNIT/ML injection Inject 3-4 Units into the skin 3 (three) times daily before meals. Per sliding scale 08/27/11  Yes Amanjot Sidhu, MD  insulin glargine (LANTUS SOLOSTAR) 100 UNIT/ML injection Inject 34 Units into the skin at bedtime. 12/23/11 12/22/12 Yes Tonia Brooms, MD  isosorbide mononitrate (IMDUR) 30 MG 24 hr tablet Take 1 tablet (30 mg total) by mouth daily. 06/26/11  Yes Jolene Provost, MD  lisinopril (PRINIVIL,ZESTRIL) 40 MG tablet Take 1 tablet (40 mg total) by mouth daily. 01/21/12 01/20/13 Yes Bartholomew Crews, MD  metoprolol succinate (TOPROL-XL) 50 MG 24 hr tablet Take 50 mg by mouth 2 (two) times daily.  06/26/11 06/25/12 Yes Jolene Provost, MD     Physical Exam: Filed Vitals:   02/07/12 1021 02/07/12 1221 02/07/12 1453 02/07/12 1500  BP:  177/80 169/72 147/48  Pulse:  60 68 79  Temp:    98.5 F (36.9 C)  TempSrc:    Oral  Resp:  16  18  Height: 5\' 5"  (1.651 m)   5\' 6"  (1.676 m)  Weight: 61.236 kg (135 lb)   71.9 kg (158 lb 8.2 oz)  SpO2:  100%  100%    General: Alert and oriented. In no distress.  Eye: Lids and conjunctivas normal. PERRL, Full EOMs. Fundi benign with normal discs and vessels.  ENT: No cranial or facial tenderness to palpation. TMs and canals clear. Nasal mucosa was normal and uncongested without any drainage. No intra oral lesions, pharynx clear, mucous membranes moist, dentition normal.  Neck: Supple, full ROM, no tenderness to palpation. No adenopathy or mass. No carotid bruit.  Lungs: Clear to auscultation.  Heart: Regular rhythm, no gallop or murmur.  Neuro: Alert and orented times 3. Speech was clear, fluent, and appropriate. Cranial nerves intact. No pronator drift, muscle strength normal. Finger to  nose normal. DTRs were 2+ and symmetrical.Station and gait were normal. Romberg's sign was normal. Able to perform tandem gait well.  Psych: Normal affect.     Labs on Admission:    Basic Metabolic Panel:  Lab [id number] 1116  NA 143  K 4.1  CL 109  CO2 24  GLUCOSE 74  BUN 18  CREATININE 0.97  CALCIUM 10.8*  MG --  PHOS --   Liver Function Tests:  Lab 02/07/12 1116  AST 13  ALT 10  ALKPHOS 122*  BILITOT 0.2*  PROT 8.0  ALBUMIN 4.0   No results found for this basename: LIPASE:5,AMYLASE:5 in the last 168 hours No results found for this basename: AMMONIA:5 in the last 168 hours CBC:  Lab 02/07/12 1116  WBC 11.8*  NEUTROABS 7.5  HGB 13.1  HCT 40.1  MCV 89.7  PLT 237   Cardiac Enzymes: No results found for this basename: CKTOTAL:5,CKMB:5,CKMBINDEX:5,TROPONINI:5 in the last 168 hours  BNP (last 3 results) No results found for this basename: PROBNP:3 in the last 8760 hours    CBG:  Lab 02/07/12 0930  GLUCAP  82    Radiological Exams on Admission: Ct Head Wo Contrast  02/07/2012  *RADIOLOGY REPORT*  Clinical Data: Right facial and upper extremity numbness. Diabetes, hypertension, hypercholesterolemia, and coronary artery disease.  CT HEAD WITHOUT CONTRAST  Technique:  Contiguous axial images were obtained from the base of the skull through the vertex without contrast.  Comparison: None.  Findings: There is no evidence of intracranial hemorrhage, brain edema or other signs of acute infarction.  There is no evidence of intracranial mass lesion or mass effect.  No abnormal extra-axial fluid collections are identified.  Moderate chronic small vessel disease is demonstrated.  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  Ventricles are normal in size.  No skull abnormality identified.  IMPRESSION:  1.  No acute intracranial abnormality. 2.  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   Original Report Authenticated By: Marlaine Hind, M.D.      [id number]: 02/07/2012  Rate: 68  Rhythm: normal sinus rhythm  QRS Axis: normal  Intervals: normal  ST/T Wave abnormalities: normal  Conduction Disutrbances:none  Narrative Interpretation: Normal sinus rhythm, LVH, left atrial enlargement, otherwise normal.  Old EKG Reviewed: none available   Assessment/Plan Principal Problem:  *CVA (cerebral vascular accident) Active Problems:  DIABETES MELLITUS, TYPE II  HYPERLIPIDEMIA  PERIPHERAL NEUROPATHY  HYPERTENSION  CORONARY ARTERY DISEASE   1. Probable CVA, the patient is already on Plavix in the acute setting will start her on aspirin.. Workup including an MRI/MRA/2 echo/carotid Doppler is pending, PT OT eval and neuro checks have been initiated. Code stroke was not called because the patient was outside the window as her symptoms started yesterday 2. Diabetes mellitus type 2 insulin-dependent, will check a hemoglobin A1c, sliding-scale insulin, Lantus, lipid panel 3. Hypertensive urgency. The patient had a systolic blood pressure of 201 upon presentation. We'll continue her outpatient medications and when necessary hydralazine 4. Coronary artery disease we'll cycle cardiac enzymes, continue Plavix and beta blocker  Code Status:   full Family Communication: bedside Disposition Plan: admit   Time spent: 70 mins   [facility] Pager [phone]  If 7PM-7AM, please contact night-coverage [email or web address] Password [facility] 02/07/2012, 3:57 PM

## 2012-02-07 NOTE — ED Notes (Signed)
C/O numbness to right side of lips/mouth and tongue, and also to right 2nd through 5th digits, starting at 1100 yesterday.  Called PCP & was told to go to ED, but said she would wait and see if it improves.  Numbness has remained constant and continues today.  Noticed slight dizziness when she woke up this morning, along with "a funny feeling" in left anterior thigh extending down into lower leg.  Denies HA, vision changes, balance problems.  Smile symmetrical, tongue midline, bilat hand grasps & foot push/pull equal and strong.  No arm drift.

## 2012-02-07 NOTE — Progress Notes (Signed)
  Echocardiogram 2D Echocardiogram has been performed.  Basilia Jumbo 02/07/2012, 2:52 PM

## 2012-02-07 NOTE — Progress Notes (Signed)
Admission Nursing Note ;  Admitted a 66 year old African American female to Room 4 cN 13 via W/C from ED. Pt C/o numbness at tip of the tongue  Rt side of the lips and tips of  Fingers of the rt hand . Denied numbness of bilateral lower  Extremities or pain.   Made comfortable in bed with cardiac monitor applied NSR on the monitor .  Oriented to room and the use of call light for assist as needed.Initial assessment completed and TIA /stroke education  given to pt and family. Will continue to monitor

## 2012-02-07 NOTE — ED Notes (Signed)
[facility] notified of pt transfer.  O2 via [place] on at 2L/min.  Cardiac monitor shows NSR without ectopy.

## 2012-02-07 NOTE — ED Notes (Signed)
Pt spouse present.

## 2012-02-08 LAB — CBC
MCH: 29.4 pg (ref 26.0–34.0)
MCHC: 32.3 g/dL (ref 30.0–36.0)
Platelets: 223 10*3/uL (ref 150–400)
RBC: 4.15 MIL/uL (ref 3.87–5.11)

## 2012-02-08 LAB — GLUCOSE, CAPILLARY
Glucose-Capillary: 92 mg/dL (ref 70–99)
Glucose-Capillary: 131 mg/dL — ABNORMAL HIGH (ref 70–99)

## 2012-02-08 LAB — RAPID URINE DRUG SCREEN, HOSP PERFORMED
Amphetamines: NOT DETECTED
Opiates: NOT DETECTED
Tetrahydrocannabinol: NOT DETECTED

## 2012-02-08 LAB — LIPID PANEL
Cholesterol: 220 mg/dL — ABNORMAL HIGH (ref 0–200)
HDL: 36 mg/dL — ABNORMAL LOW (ref >39)
Total CHOL/HDL Ratio: 6.1 RATIO
Triglycerides: 318 mg/dL — ABNORMAL HIGH (ref <150)

## 2012-02-08 LAB — CREATININE, SERUM: Creatinine, Ser: 1 mg/dL (ref 0.50–1.10)

## 2012-02-08 MED ORDER — ASPIRIN 81 MG PO TABS: 325.0000 mg | ORAL_TABLET | Freq: Every day | ORAL | Status: DC

## 2012-02-08 MED ORDER — ATORVASTATIN CALCIUM 40 MG PO TABS: 20.0000 mg | ORAL_TABLET | Freq: Every day | ORAL | Status: DC

## 2012-02-08 NOTE — Evaluation (Signed)
Physical Therapy Evaluation Patient Details Name: Stephanie Gross MRN: [id number] DOB: Apr 23, 1946 Today's Date: 02/08/2012 Time: [id number] PT Time Calculation (min): 13 min  PT Assessment / Plan / Recommendation Clinical Impression  Pt admitted with facial numbness, found to have watershed CVAs. Pt with no functional deficits. Pt is at her baseline functional mobility, no acute PT needs. Will not follow    PT Assessment  Patent does not need any further PT services    Follow Up Recommendations  No PT follow up    Does the patient have the potential to tolerate intense rehabilitation      Barriers to Discharge        Equipment Recommendations  None recommended by PT    Recommendations for Other Services     Frequency      Precautions / Restrictions Precautions Precautions: None Restrictions Weight Bearing Restrictions: No   Pertinent Vitals/Pain No pain complaints.       Mobility  Bed Mobility Bed Mobility: Supine to Sit;Sitting - Scoot to Edge of Bed Supine to Sit: 6: Modified independent (Device/Increase time) Sitting - Scoot to Edge of Bed: 6: Modified independent (Device/Increase time) Transfers Transfers: Sit to Stand;Stand to Sit Sit to Stand: 6: Modified independent (Device/Increase time) Stand to Sit: 6: Modified independent (Device/Increase time) Ambulation/Gait Ambulation/Gait Assistance: 6: Modified independent (Device/Increase time) Ambulation Distance (Feet): 200 Feet Assistive device: None Ambulation/Gait Assistance Details: Pt with slight limp on L side from previous L TKA Gait Pattern: Within Functional Limits Gait velocity: normal gait speed Stairs: Yes Stairs Assistance: 6: Modified independent (Device/Increase time) Stair Management Technique: No rails;Forwards Number of Stairs: 12  Modified Rankin (Stroke Patients Only) Pre-Morbid Rankin Score: No symptoms Modified Rankin: No symptoms    Shoulder Instructions     Exercises      PT Diagnosis:    PT Problem List:   PT Treatment Interventions:     PT Goals    Visit Information  Last PT Received On: 02/08/12 Assistance Needed: +1    Subjective Data  Patient Stated Goal: to figure out whats causing the stokres   Prior [place] Lives With: Spouse Available Help at Discharge: Family;Available 24 hours/day Type of Home: House Home Access: Stairs to enter [employer] of Steps: 4 Entrance Stairs-Rails: Right Home Layout: One level Bathroom Shower/Tub: Product/process development scientist: Standard Bathroom Accessibility: Yes How Accessible: Accessible via walker Home Adaptive Equipment: None Prior Function Level of Independence: Independent Able to Take Stairs?: Yes Driving: Yes Communication Communication: No difficulties Dominant Hand: Right    Cognition  Overall Cognitive Status: Appears within functional limits for tasks assessed/performed Arousal/Alertness: Awake/alert Orientation Level: Appears intact for tasks assessed Behavior During Session: [facility] for tasks performed    Extremity/Trunk Assessment Right Lower Extremity Assessment RLE ROM/Strength/Tone: Within functional levels RLE Sensation: WFL - Light Touch RLE Coordination: WFL - gross/fine motor Left Lower Extremity Assessment LLE ROM/Strength/Tone: Within functional levels LLE Sensation: WFL - Light Touch LLE Coordination: WFL - gross/fine motor   Balance Balance Balance Assessed: Yes High Level Balance High Level Balance Activites: Direction changes;Turns;Sudden stops High Level Balance Comments: No difficulty with any high level balance tasks  End of Session PT - End of Session Activity Tolerance: Patient tolerated treatment well Patient left: in chair;with call bell/phone within reach Nurse Communication: Mobility status  GP Functional Assessment Tool Used: clinical judgement Functional Limitation: Mobility: Walking and moving  around Mobility: Walking and Moving Around Current Status [id number]): 0 percent impaired, limited or restricted  Mobility: Walking and Moving Around Goal Status [phone]): 0 percent impaired, limited or restricted Mobility: Walking and Moving Around Discharge Status [id number]): 0 percent impaired, limited or restricted   Ambrose Finland 02/08/2012, 1:56 PM  02/08/2012 Ambrose Finland DPT PAGER: [phone] OFFICE: [phone]

## 2012-02-08 NOTE — ED Provider Notes (Signed)
Medical screening examination/treatment/procedure(s) were performed by non-physician practitioner and as supervising physician I was immediately available for consultation/collaboration.  Threasa Beards, MD 02/08/12 [phone]

## 2012-02-08 NOTE — Discharge Summary (Signed)
Physician Discharge Summary  Stephanie Gross MRN: [id number] DOB/AGE: 1945/08/18 66 y.o.  PCP: Tonia Brooms, MD   Admit date: 02/07/2012 Discharge date: 02/08/2012  Discharge Diagnoses:  Dyslipidemia  *CVA (cerebral vascular accident) Active Problems:  DIABETES MELLITUS, TYPE II  HYPERLIPIDEMIA  PERIPHERAL NEUROPATHY  HYPERTENSION  CORONARY ARTERY DISEASE     Medication List     As of 02/08/2012  8:47 AM    TAKE these medications         aspirin 81 MG tablet   Take 4 tablets (325 mg total) by mouth daily.      atorvastatin 40 MG tablet   Commonly known as: LIPITOR   Take 0.5 tablets (20 mg total) by mouth daily.      chlorthalidone 25 MG tablet   Commonly known as: HYGROTON   Take 1 tablet (25 mg total) by mouth daily.      clopidogrel 75 MG tablet   Commonly known as: PLAVIX   Take 75 mg by mouth daily.      esomeprazole 20 MG capsule   Commonly known as: NEXIUM   Take 1 capsule (20 mg total) by mouth daily before breakfast.      fish oil-omega-3 fatty acids 1000 MG capsule   Take 1 g by mouth 3 (three) times daily.      insulin aspart 100 UNIT/ML injection   Commonly known as: novoLOG   Inject 3-4 Units into the skin 3 (three) times daily before meals. Per sliding scale      insulin glargine 100 UNIT/ML injection   Commonly known as: LANTUS   Inject 34 Units into the skin at bedtime.      isosorbide mononitrate 30 MG 24 hr tablet   Commonly known as: IMDUR   Take 1 tablet (30 mg total) by mouth daily.      lisinopril 40 MG tablet   Commonly known as: PRINIVIL,ZESTRIL   Take 1 tablet (40 mg total) by mouth daily.      metoprolol succinate 50 MG 24 hr tablet   Commonly known as: TOPROL-XL   Take 50 mg by mouth 2 (two) times daily.        Discharge Condition: Stable Disposition: 04-Intermediate Care Facility   Consults:  None  Significant Diagnostic Studies: Dg Chest 2 View  02/07/2012  *RADIOLOGY REPORT*  Clinical Data:  Leukocytosis.  CHEST - 2 VIEW  Comparison: Two-view chest 06/24/2007.  Findings: The heart size is normal.  The lungs are clear.  The visualized soft tissues and bony thorax are unremarkable.  IMPRESSION: Negative chest.   Original Report Authenticated By: Resa Miner. MATTERN, M.D.    Ct Head Wo Contrast  02/07/2012  *RADIOLOGY REPORT*  Clinical Data: Right facial and upper extremity numbness. Diabetes, hypertension, hypercholesterolemia, and coronary artery disease.  CT HEAD WITHOUT CONTRAST  Technique:  Contiguous axial images were obtained from the base of the skull through the vertex without contrast.  Comparison: None.  Findings: There is no evidence of intracranial hemorrhage, brain edema or other signs of acute infarction.  There is no evidence of intracranial mass lesion or mass effect.  No abnormal extra-axial fluid collections are identified.  Moderate chronic small vessel disease is demonstrated.  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  Ventricles are normal in size.  No skull abnormality identified.  IMPRESSION:  1.  No acute intracranial abnormality. 2.  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   Original Report Authenticated By: Marlaine Hind,  M.D.    Mr Brain Wo Contrast  02/07/2012  *RADIOLOGY REPORT*  Clinical Data:  Numbness involving the right upper extremity, left leg, and periorbital areas.  Gait and balance since yesterday.  MRI HEAD WITHOUT CONTRAST MRA HEAD WITHOUT CONTRAST  Technique:  Multiplanar, multiecho pulse sequences of the brain and surrounding structures were obtained without intravenous contrast. Angiographic images of the head were obtained using MRA technique without contrast.  Comparison:  CT head without contrast 02/07/2012.  MRI HEAD  Findings:  Acute punctate non hemorrhagic infarct is present within the left thalamus.  No hemorrhage or mass lesion is present.  There is a remote lacunar infarct of the right thalamus. A remote lacunar  infarct is also present in the left cerebellum.  Mild generalized atrophy is present.  Extensive confluent periventricular subcortical white matter disease is present bilaterally.  Flow is present in the major intracranial arteries.  The globes and orbits are intact.  Mild mucosal thickening is present in the ethmoid air cells bilaterally.  Minimal fluid is present in the left mastoid air cells.  No obstructing nasopharyngeal lesion is evident.  IMPRESSION:  1.  Punctate non hemorrhagic infarct within the left thalamus. 2.  Remote lacunar infarct of the right thalamus. 3.  Remote lacunar infarct of the left cerebellum. 4.  Extensive white matter disease.  This likely reflects the sequelae of chronic microvascular ischemia.  MRA HEAD  Findings: There is irregularity within the cavernous carotid arteries bilaterally.  A moderate to stenosis of at least 60% is present in the pre cavernous right internal carotid artery.  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  The terminal ICAs are normal bilaterally.  The anterior communicating artery is patent.  The A1 and M1 segments are normal.  The MCA bifurcations are normal bilaterally. Segmental narrowing is present within the MCA branch vessels.  The left vertebral artery is the dominant vessel.  The AICA vessels are dominant bilaterally.  The posterior cerebral arteries both originate from a normal basilar tip.  There is mild attenuation PCA branch vessels.  IMPRESSION:  1.  Focal stenosis in the pre cavernous right internal carotid artery is at least moderate. 2.  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 3.  Mild to moderate small vessel disease.  These results were called by telephone on 02/07/2012 at 07:50 p.m. to Elspeth Cho, RN, who verbally acknowledged these results.   Original Report Authenticated By: Resa Miner. MATTERN, M.D.    Mr [name] Head/brain Wo Cm  02/07/2012  *RADIOLOGY REPORT*  Clinical Data:  Numbness  involving the right upper extremity, left leg, and periorbital areas.  Gait and balance since yesterday.  MRI HEAD WITHOUT CONTRAST MRA HEAD WITHOUT CONTRAST  Technique:  Multiplanar, multiecho pulse sequences of the brain and surrounding structures were obtained without intravenous contrast. Angiographic images of the head were obtained using MRA technique without contrast.  Comparison:  CT head without contrast 02/07/2012.  MRI HEAD  Findings:  Acute punctate non hemorrhagic infarct is present within the left thalamus.  No hemorrhage or mass lesion is present.  There is a remote lacunar infarct of the right thalamus. A remote lacunar infarct is also present in the left cerebellum.  Mild generalized atrophy is present.  Extensive confluent periventricular subcortical white matter disease is present bilaterally.  Flow is present in the major intracranial arteries.  The globes and orbits are intact.  Mild mucosal thickening is present in the ethmoid air cells  bilaterally.  Minimal fluid is present in the left mastoid air cells.  No obstructing nasopharyngeal lesion is evident.  IMPRESSION:  1.  Punctate non hemorrhagic infarct within the left thalamus. 2.  Remote lacunar infarct of the right thalamus. 3.  Remote lacunar infarct of the left cerebellum. 4.  Extensive white matter disease.  This likely reflects the sequelae of chronic microvascular ischemia.  MRA HEAD  Findings: There is irregularity within the cavernous carotid arteries bilaterally.  A moderate to stenosis of at least 60% is present in the pre cavernous right internal carotid artery.  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  The terminal ICAs are normal bilaterally.  The anterior communicating artery is patent.  The A1 and M1 segments are normal.  The MCA bifurcations are normal bilaterally. Segmental narrowing is present within the MCA branch vessels.  The left vertebral artery is the dominant vessel.  The AICA vessels are  dominant bilaterally.  The posterior cerebral arteries both originate from a normal basilar tip.  There is mild attenuation PCA branch vessels.  IMPRESSION:  1.  Focal stenosis in the pre cavernous right internal carotid artery is at least moderate. 2.  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 3.  Mild to moderate small vessel disease.  These results were called by telephone on 02/07/2012 at 07:50 p.m. to Elspeth Cho, RN, who verbally acknowledged these results.   Original Report Authenticated By: Resa Miner. MATTERN, M.D.     2-D echo  LV EF: 60% - 65%  ------------------------------------------------------------ Indications: TIA 435.9.  ------------------------------------------------------------ History: PMH: Coronary artery disease. Stroke. Risk factors: Peripheral neuropathy. PVD. GERD. Current tobacco use. Hypertension. Diabetes mellitus. Dyslipidemia.  ------------------------------------------------------------ Study Conclusions  - Left ventricle: The cavity size was normal. There was mild concentric hypertrophy. Systolic function was normal. The estimated ejection fraction was in the range of 60% to 65%. Wall motion was normal; there were no regional wall motion abnormalities. Doppler parameters are consistent with abnormal left ventricular relaxation (grade 1 diastolic dysfunction). - Aortic valve: Mild regurgitation. - Pulmonary arteries: PA peak pressure: 57mm Hg (S). Impressions:  - No cardiac source of emboli was indentified.   Microbiology: No results found for this or any previous visit (from the past 240 hour(s)).   Labs: Results for orders placed during the hospital encounter of 02/07/12 (from the past 48 hour(s))  CBC WITH DIFFERENTIAL     Status: Abnormal   Collection Time   02/07/12 11:16 AM      Component Value Range Comment   WBC 11.8 (*) 4.0 - 10.5 K/uL    RBC 4.47  3.87 - 5.11 MIL/uL    Hemoglobin 13.1  12.0 - 15.0  g/dL    HCT 40.1  36.0 - 46.0 %    MCV 89.7  78.0 - 100.0 fL    MCH 29.3  26.0 - 34.0 pg    MCHC 32.7  30.0 - 36.0 g/dL    RDW 13.1  11.5 - 15.5 %    Platelets 237  150 - 400 K/uL    Neutrophils Relative 64  43 - 77 %    Neutro Abs 7.5  1.7 - 7.7 K/uL    Lymphocytes Relative 28  12 - 46 %    Lymphs Abs 3.2  0.7 - 4.0 K/uL    Monocytes Relative 6  3 - 12 %    Monocytes Absolute 0.7  0.1 - 1.0 K/uL    Eosinophils Relative 2  0 - 5 %    Eosinophils Absolute 0.2  0.0 - 0.7 K/uL    Basophils Relative 1  0 - 1 %    Basophils Absolute 0.1  0.0 - 0.1 K/uL   COMPREHENSIVE METABOLIC PANEL     Status: Abnormal   Collection Time   02/07/12 11:16 AM      Component Value Range Comment   Sodium 143  135 - 145 mEq/L    Potassium 4.1  3.5 - 5.1 mEq/L    Chloride 109  96 - 112 mEq/L    CO2 24  19 - 32 mEq/L    Glucose, Bld 74  70 - 99 mg/dL    BUN 18  6 - 23 mg/dL    Creatinine, Ser 0.97  0.50 - 1.10 mg/dL    Calcium 10.8 (*) 8.4 - 10.5 mg/dL    Total Protein 8.0  6.0 - 8.3 g/dL    Albumin 4.0  3.5 - 5.2 g/dL    AST 13  0 - 37 U/L    ALT 10  0 - 35 U/L    Alkaline Phosphatase 122 (*) 39 - 117 U/L    Total Bilirubin 0.2 (*) 0.3 - 1.2 mg/dL    GFR calc non Af Amer 60 (*) >90 mL/min    GFR calc Af Amer 70 (*) >90 mL/min   PROTIME-INR     Status: Normal   Collection Time   02/07/12 11:16 AM      Component Value Range Comment   Prothrombin Time 12.1  11.6 - 15.2 seconds    INR 0.90  0.00 - 1.49   URINALYSIS, ROUTINE W REFLEX MICROSCOPIC     Status: Normal   Collection Time   02/07/12 12:05 PM      Component Value Range Comment   Color, Urine YELLOW  YELLOW    APPearance CLEAR  CLEAR    Specific Gravity, Urine 1.015  1.005 - 1.030    pH 6.0  5.0 - 8.0    Glucose, UA NEGATIVE  NEGATIVE mg/dL    Hgb urine dipstick NEGATIVE  NEGATIVE    Bilirubin Urine NEGATIVE  NEGATIVE    Ketones, ur NEGATIVE  NEGATIVE mg/dL    Protein, ur NEGATIVE  NEGATIVE mg/dL    Urobilinogen, UA 0.2  0.0 - 1.0  mg/dL    Nitrite NEGATIVE  NEGATIVE    Leukocytes, UA NEGATIVE  NEGATIVE MICROSCOPIC NOT DONE ON URINES WITH NEGATIVE PROTEIN, BLOOD, LEUKOCYTES, NITRITE, OR GLUCOSE <1000 mg/dL.  URINE RAPID DRUG SCREEN (HOSP PERFORMED)     Status: Normal   Collection Time   02/07/12 12:05 PM      Component Value Range Comment   Opiates NONE DETECTED  NONE DETECTED    Cocaine NONE DETECTED  NONE DETECTED    Benzodiazepines NONE DETECTED  NONE DETECTED    Amphetamines NONE DETECTED  NONE DETECTED    Tetrahydrocannabinol NONE DETECTED  NONE DETECTED    Barbiturates NONE DETECTED  NONE DETECTED   CBC     Status: Abnormal   Collection Time   02/07/12  3:05 PM      Component Value Range Comment   WBC 10.7 (*) 4.0 - 10.5 K/uL    RBC 4.15  3.87 - 5.11 MIL/uL    Hemoglobin 12.2  12.0 - 15.0 g/dL    HCT 37.8  36.0 - 46.0 %    MCV 91.1  78.0 - 100.0 fL    MCH 29.4  26.0 - 34.0  pg    MCHC 32.3  30.0 - 36.0 g/dL    RDW 13.3  11.5 - 15.5 %    Platelets 223  150 - 400 K/uL   CREATININE, SERUM     Status: Abnormal   Collection Time   02/07/12  3:05 PM      Component Value Range Comment   Creatinine, Ser 1.00  0.50 - 1.10 mg/dL    GFR calc non Af Amer 58 (*) >90 mL/min    GFR calc Af Amer 67 (*) >90 mL/min   GLUCOSE, CAPILLARY     Status: Abnormal   Collection Time   02/07/12  3:41 PM      Component Value Range Comment   Glucose-Capillary 179 (*) 70 - 99 mg/dL   TROPONIN I     Status: Normal   Collection Time   02/07/12  7:36 PM      Component Value Range Comment   Troponin I <0.30  <0.30 ng/mL   GLUCOSE, CAPILLARY     Status: Abnormal   Collection Time   02/07/12  9:39 PM      Component Value Range Comment   Glucose-Capillary 113 (*) 70 - 99 mg/dL    Comment 1 Documented in Chart      Comment 2 Notify RN     TROPONIN I     Status: Normal   Collection Time   02/08/12  2:36 AM      Component Value Range Comment   Troponin I <0.30  <0.30 ng/mL   LIPID PANEL     Status: Abnormal   Collection  Time   02/08/12  2:36 AM      Component Value Range Comment   Cholesterol 220 (*) 0 - 200 mg/dL    Triglycerides 318 (*) <150 mg/dL    HDL 36 (*) >39 mg/dL    Total CHOL/HDL Ratio 6.1      VLDL 64 (*) 0 - 40 mg/dL    LDL Cholesterol 120 (*) 0 - 99 mg/dL   GLUCOSE, CAPILLARY     Status: Normal   Collection Time   02/08/12  6:37 AM      Component Value Range Comment   Glucose-Capillary 92  70 - 99 mg/dL    Comment 1 Documented in Chart      Comment 2 Notify RN        HPI :  66 year old female with multiple risk factors including smoking, diabetes, coronary artery disease, hypertension, hypercholesterolemia, and tobacco use who presents today with a history since 54 AM yesterday of numbness in the tip of the fingers or third right upper extremity and periorbital numbness, and left leg numbness and heaviness. She has noted gait imbalance since yesterday. She also thinks her left leg for a little bit numb today.  She denied any headache, stiff neck, but diplopia, blurry vision, difficulty with speech or swallowing, numbness of the right leg, weakness of the face, arm, or leg. She has not had symptoms like this before and denies any history of stroke or TIAs. She had a stent placed 6-7 years ago and is on Plavix following that. She's been diabetic since she was in her 65s. Most recent blood sugar was 111 yesterday and she takes insulin for that. She smokes one half pack of cigarettes per day. She denied any chest pain, tightness, pressure, shortness of breath, palpitations, or syncope.     HOSPITAL COURSE:  #1 acute CVA/Acute punctate non hemorrhagic infarct is present within the  left thalamus as per MRI. Patient already on Plavix Add low-dose aspirin Discussed elevated triglycerides and LDL Patient states that she takes Lipitor twice a week Recommended to take Lipitor on daily basis PT OT eval pending when complete the patient may be discharged home today Swallowing was tested by the  bedside and was intact  #2. Diabetes hemoglobin A1c pending Patient to resume her home regimen of insulin posterior discharge  #3 Hypertension/hypertensive urgency with systolic blood pressure greater than 200 upon presentation Patient on multiple medications at home including chlorthalidone, Imdur , metoprolol, lisinopril  #4 hypertriglyceridemia/dyslipidemia Patient to continue with Lipitor a daily basis   Discharge Exam:  Blood pressure 165/72, pulse 67, temperature 98.1 F (36.7 C), temperature source Oral, resp. rate 18, height 5\' 6"  (1.676 m), weight 71.9 kg (158 lb 8.2 oz), SpO2 99.00%.  General: Alert and oriented. In no distress.  Eye: Lids and conjunctivas normal. PERRL, Full EOMs. Fundi benign with normal discs and vessels.  ENT: No cranial or facial tenderness to palpation. TMs and canals clear. Nasal mucosa was normal and uncongested without any drainage. No intra oral lesions, pharynx clear, mucous membranes moist, dentition normal.  Neck: Supple, full ROM, no tenderness to palpation. No adenopathy or mass. No carotid bruit.  Lungs: Clear to auscultation.  Heart: Regular rhythm, no gallop or murmur.  Neuro: Alert and orented times 3. Speech was clear, fluent, and appropriate. Cranial nerves intact. No pronator drift, muscle strength normal. Finger to nose normal. DTRs were 2+ and symmetrical.Station and gait were normal. Romberg's sign was normal. Able to perform tandem gait well.  Psych: Normal affect        Discharge Orders    Future Appointments: Provider: Department: Dept Phone: Center:   03/09/2012 1:45 PM Tonia Brooms, MD [place] [phone] [facility]      Follow-up Information    Follow up with Tonia Brooms, MD. Schedule an appointment as soon as possible for a visit in 1 week. (. Stable followup)    Contact information:   [address] [place] [address] [phone]       Follow up with Forbes Cellar, MD.  Schedule an appointment as soon as possible for a visit in 4 weeks. (Posthospital followup)    Contact information:   [place], [address] [place] [address] [phone]          Signed: Reyne Dumas 02/08/2012, 8:47 AM

## 2012-02-09 LAB — PTH, INTACT AND CALCIUM: PTH: 65.8 pg/mL (ref 14.0–72.0)

## 2012-02-13 ENCOUNTER — Ambulatory Visit (INDEPENDENT_AMBULATORY_CARE_PROVIDER_SITE_OTHER): Payer: No Typology Code available for payment source | Admitting: Internal Medicine

## 2012-02-13 ENCOUNTER — Encounter: Payer: Self-pay | Admitting: Internal Medicine

## 2012-02-13 VITALS — BP 145/72 | HR 74 | Temp 98.2°F | Ht 65.5 in | Wt 163.2 lb

## 2012-02-13 DIAGNOSIS — I10 Essential (primary) hypertension: Secondary | ICD-10-CM

## 2012-02-13 DIAGNOSIS — E785 Hyperlipidemia, unspecified: Secondary | ICD-10-CM

## 2012-02-13 DIAGNOSIS — I6350 Cerebral infarction due to unspecified occlusion or stenosis of unspecified cerebral artery: Secondary | ICD-10-CM

## 2012-02-13 DIAGNOSIS — I639 Cerebral infarction, unspecified: Secondary | ICD-10-CM

## 2012-02-13 MED ORDER — ISOSORBIDE MONONITRATE ER 60 MG PO TB24: 60.0000 mg | ORAL_TABLET | Freq: Every day | ORAL | Status: DC

## 2012-02-13 MED ORDER — ATORVASTATIN CALCIUM 40 MG PO TABS: 40.0000 mg | ORAL_TABLET | Freq: Every day | ORAL | Status: DC

## 2012-02-13 NOTE — Patient Instructions (Addendum)
-  One of the most important things you can do to decrease your chance of a future stroke is stop smoking.  You can call 1-800-QUIT-NOW to inquire about free nicotine patches.  -Please increase imdur (isosorbide mononitrate) to 60mg  daily.  If you experience symptoms such as dizziness or feeling faint, please stop this medication and call the clinic.  -Continue lipitor, and if you find you cannot tolerate it, please call the clinic  -We encourage at least one follow up appointment with the neurologist if you can afford it.  Please be sure to bring all of your medications with you to every visit.  Should you have any new or worsening symptoms, please be sure to call the clinic at [phone].

## 2012-02-13 NOTE — Progress Notes (Signed)
Subjective:   Patient ID: Stephanie Gross female   DOB: July 29, 1945 66 y.o.   MRN: [id number]  HPI: [name] is a 89 y.o. woman with h/o CAD, DM, HTN, GERD & HLD who presents for HFU.  She was hospitalized from 02/07/12-02/08/12 after an acute punctate non hemorrhagic CVA (per MRI).  ASA was added to plavix, which she was already on.    Since hospital discharge, she has no complaints.  No new numbness/tingling/weakness/dizziness.  She continues to try to quit smoking.  She is concerned about how to prevent further strokes, since she was told that she had several chronic strokes (seen on imaging) during hospitalization.   Past Medical History  Diagnosis Date  . CAD (coronary artery disease)     s/p RCA stent 2007. Myoview 2010 normal  . DM (diabetes mellitus)   . HTN (hypertension)   . Hyperlipidemia   . GERD (gastroesophageal reflux disease)    Current Outpatient Prescriptions  Medication Sig Dispense Refill  . aspirin 81 MG tablet Take 4 tablets (325 mg total) by mouth daily.  30 tablet  0  . atorvastatin (LIPITOR) 40 MG tablet Take 0.5 tablets (20 mg total) by mouth daily.  30 tablet  2  . chlorthalidone (HYGROTON) 25 MG tablet Take 1 tablet (25 mg total) by mouth daily.  30 tablet  4  . clopidogrel (PLAVIX) 75 MG tablet Take 75 mg by mouth daily.      Marland Kitchen esomeprazole (NEXIUM) 20 MG capsule Take 1 capsule (20 mg total) by mouth daily before breakfast.  90 capsule  3  . fish oil-omega-3 fatty acids 1000 MG capsule Take 1 g by mouth 3 (three) times daily.        . insulin aspart (NOVOLOG) 100 UNIT/ML injection Inject 3-4 Units into the skin 3 (three) times daily before meals. Per sliding scale      . insulin glargine (LANTUS SOLOSTAR) 100 UNIT/ML injection Inject 34 Units into the skin at bedtime.  45 mL  3  . isosorbide mononitrate (IMDUR) 30 MG 24 hr tablet Take 1 tablet (30 mg total) by mouth daily.  90 tablet  3  . lisinopril (PRINIVIL,ZESTRIL) 40 MG tablet Take  1 tablet (40 mg total) by mouth daily.  30 tablet  5  . metoprolol succinate (TOPROL-XL) 50 MG 24 hr tablet Take 50 mg by mouth 2 (two) times daily.        No family history on file. History   Social History  . Marital Status: Single    Spouse Name: N/A    Number of Children: N/A  . Years of Education: N/A   Occupational History  . unemployed    Social History Main Topics  . Smoking status: Current Every Day Smoker -- 0.5 packs/day for 30 years    Types: Cigarettes  . Smokeless tobacco: Never Used  . Alcohol Use: No  . Drug Use: No  . Sexually Active: Not on file   Other Topics Concern  . Not on file   Social History Narrative   Patient recently lost her job and is currently without insurance. - Nov 2012   Review of Systems: General: no fevers, chills, changes in weight, changes in appetite Skin: no rash HEENT: no blurry vision, hearing changes, sore throat Pulm: no dyspnea, coughing, wheezing CV: no chest pain, palpitations, shortness of breath Abd: no abdominal pain, nausea/vomiting, diarrhea/constipation GU: no dysuria, hematuria, polyuria Ext: no arthralgias, myalgias Neuro: no weakness, numbness, or tingling  Objective:  Physical Exam: Filed Vitals:   02/13/12 1407  BP: 145/72  Pulse: 74  Temp: 98.2 F (36.8 C)  TempSrc: Oral  Height: 5' 5.5" (1.664 m)  Weight: 163 lb 3.2 oz (74.027 kg)  SpO2: 96%   Constitutional: Vital signs reviewed.  Patient is a well-developed and well-nourished woman in no acute distress and cooperative with exam. Mouth: no erythema or exudates, MMM Eyes: PERRL, EOMI, conjunctivae normal, No scleral icterus.   Cardiovascular: RRR, S1 normal, S2 normal, no MRG, pulses symmetric and intact bilaterally Pulmonary/Chest: CTAB, no wheezes, rales, or rhonchi Abdominal: Soft. Non-tender, non-distended, bowel sounds are normal, no masses, organomegaly, or guarding present.   Neurological: A&O x3, Strength is normal and symmetric  bilaterally, cranial nerve II-XII are grossly intact, no focal motor deficit, sensory intact to light touch bilaterally. Cerebellar function intact (normal finger to nose and dysdiadokinesia), normal gait Skin: Warm, dry and intact. No rash, cyanosis, or clubbing.  Psychiatric: Normal mood and affect. speech and behavior is normal. Judgment and thought content normal. Cognition and memory are normal.   Assessment & Plan:  Case and care discussed with Dr. Ellwood Dense.  Patient to return in 3 months to f/u with PCP regarding DM/HTN/HLD/smoking cessation.  Please see problem oriented charting for further details.

## 2012-02-14 NOTE — Assessment & Plan Note (Addendum)
Patient is concerned about how to prevent further strokes.  I explained that improved BP control will be of greatest benefit.  Also, she should continue with smoking cessation, continue to improve lipid profile/improve diet, and improve glycemic control.    Most recent A1c = 8.  Most recent LDL = 120.    She is willing to increase her lipitor dose to 40mg  qHS as she has been tolerating 20mg .  She will continue to aim for smoking cessation.  Finally, we will increase imdur to 60mg  daily.  She understands to monitor for symptoms of dizziness/orthostasis.    If she has no problems, then she will return in 3 months for BP follow up, DM follow up and repeat Lipid panel with her PCP.

## 2012-03-09 ENCOUNTER — Encounter: Payer: Self-pay | Admitting: Internal Medicine

## 2012-03-09 ENCOUNTER — Ambulatory Visit (INDEPENDENT_AMBULATORY_CARE_PROVIDER_SITE_OTHER): Payer: Self-pay | Admitting: Internal Medicine

## 2012-03-09 VITALS — BP 138/71 | HR 76 | Temp 98.4°F | Ht 66.0 in | Wt 164.8 lb

## 2012-03-09 DIAGNOSIS — I10 Essential (primary) hypertension: Secondary | ICD-10-CM

## 2012-03-09 DIAGNOSIS — Z23 Encounter for immunization: Secondary | ICD-10-CM

## 2012-03-09 DIAGNOSIS — E785 Hyperlipidemia, unspecified: Secondary | ICD-10-CM

## 2012-03-09 DIAGNOSIS — I6350 Cerebral infarction due to unspecified occlusion or stenosis of unspecified cerebral artery: Secondary | ICD-10-CM

## 2012-03-09 DIAGNOSIS — E119 Type 2 diabetes mellitus without complications: Secondary | ICD-10-CM

## 2012-03-09 DIAGNOSIS — I639 Cerebral infarction, unspecified: Secondary | ICD-10-CM

## 2012-03-09 LAB — GLUCOSE, CAPILLARY: Glucose-Capillary: 91 mg/dL (ref 70–99)

## 2012-03-09 MED ORDER — CARVEDILOL 25 MG PO TABS: 25.0000 mg | ORAL_TABLET | Freq: Two times a day (BID) | ORAL | Status: DC

## 2012-03-09 MED ORDER — INSULIN GLARGINE 100 UNIT/ML ~~LOC~~ SOLN: 36.0000 [IU] | Freq: Every day | SUBCUTANEOUS | Status: DC

## 2012-03-09 NOTE — Assessment & Plan Note (Signed)
Lab Results  Component Value Date   HGBA1C 8.0* 02/07/2012   HGBA1C 8.7 12/23/2011   CREATININE 1.00 02/07/2012   CREATININE 1.16* 12/23/2011   MICROALBUR 10.71* 12/23/2011   MICRALBCREAT 68.9* 12/23/2011   CHOL 220* 02/08/2012   HDL 36* 02/08/2012   TRIG 318* 02/08/2012    Assessment: Diabetes control: not controlled Progress toward goals: improved Barriers to meeting goals: no barriers identified  Plan: Diabetes treatment: Increase lantus to 36U at night. Continue novolog with meals Refer to: none.  Microalbumin:creatinine ratio improved, patient on ACE inhibitor. Instruction/counseling given: reminded to get eye exam, reminded to bring blood glucose meter & log to each visit and reminded to bring medications to each visit

## 2012-03-09 NOTE — Assessment & Plan Note (Signed)
Patient's LDL is elevated at 120 during hospitalization in October. Since discharge, she's been taking atorvastatin 40 mg daily. We'll recheck her lipid panel in 3 months after being on daily atorvastatin therapy.

## 2012-03-09 NOTE — Assessment & Plan Note (Signed)
Discussed blood pressure control, blood glucose control, and smoking cessation as necessary for prevention of recurrent strokes. Patient voiced understanding. Encouragingly, her blood pressure is much better today at 136/71 after some recent titration of her medications. HbA1c also seems to be downtrending. She says she is cutting back on smoking, but is not interested in nicotine replacement therapy at this time.  - Appointment  with neurology tomorrow - Continue aggressive risk factor modification. Will see patient again in 3 months.

## 2012-03-09 NOTE — Assessment & Plan Note (Signed)
Lab Results  Component Value Date   NA 143 02/07/2012   K 4.1 02/07/2012   CL 109 02/07/2012   CO2 24 02/07/2012   BUN 18 02/07/2012   CREATININE 1.00 02/07/2012   CREATININE 1.16* 12/23/2011    BP Readings from Last 3 Encounters:  03/09/12 138/71  02/13/12 145/72  02/08/12 149/66    Assessment: Hypertension control:  mildly elevated  Progress toward goals:  improved Barriers to meeting goals:  no barriers identified  Plan: Hypertension treatment:  Will continue isosorbide, chlorthalidone, and lisinopril therapy. We'll substitute Coreg 50 mg twice a day for Toprol, as expected modest improvement in blood pressure control with Coreg over Toprol.

## 2012-03-09 NOTE — Patient Instructions (Signed)
1. Please STOP TAKING metoprolol, and start taking carvedilol TWICE A DAY 2. Please increase your lantus dose to 36 units 3. Keep taking all of your other medicines 4. Please come back and see me in one month

## 2012-03-09 NOTE — Progress Notes (Addendum)
Subjective:   Patient ID: Stephanie Gross female   DOB: 1945-04-30 66 y.o.   MRN: [id number]  HPI: Stephanie Gross is a 66 y.o. female with past medical history of coronary artery disease status post stenting, multiple CVAs, type 2 diabetes, hypertension, and hyperlipidemia presenting for followup. She was recently discharged from the hospital for treatment of an L-sided acute nonhemorrhagic thalamic infarct. At the time she was hypertensive with systolic pressures in the 200s. No embolic source on echocardiogram or significant stenoses of ICAs. She was discharged on dual therapy with aspirin and plavix. Since that time, she reports an episode 3 days ago when she noticed numbness of the right side of her lower face. She did not want to seek medical attention and went home. She reports that area of her face occasionally burns and tingles now, but she has regained sensation. She has an appointment for followup with neurology tomorrow morning. In regards to her diabetes, she was increased to Lantus 34 units at night at her last office visit. She also takes sliding scale NovoLog with meals. Her HbA1c during her hospitalization in October was 8. She denies any symptoms of hypoglycemia. In regards to her hypertension, she is taking isosorbide, metoprolol, lisinopril, and chlorthalidone. No medications were added during her recent hospitalization. She was noted to have high cholesterol during her hospitalization, with LDL of 120. We have been titrating up her Lipitor therapy prior to this admission due to to intolerance of side effects in the past. At the time of her hospitalization she was taking one pill of 40 mg every other day. Since discharge she's been taking 40 mg of Lipitor daily.    Past Medical History  Diagnosis Date  . CAD (coronary artery disease)     s/p RCA stent 2007. Myoview 2010 normal  . DM (diabetes mellitus)   . HTN (hypertension)   . Hyperlipidemia   . GERD  (gastroesophageal reflux disease)    Current Outpatient Prescriptions  Medication Sig Dispense Refill  . aspirin 81 MG tablet Take 4 tablets (325 mg total) by mouth daily.  30 tablet  0  . atorvastatin (LIPITOR) 40 MG tablet Take 1 tablet (40 mg total) by mouth daily.  30 tablet  2  . carvedilol (COREG) 25 MG tablet Take 1 tablet (25 mg total) by mouth 2 (two) times daily.  60 tablet  11  . chlorthalidone (HYGROTON) 25 MG tablet Take 1 tablet (25 mg total) by mouth daily.  30 tablet  4  . clopidogrel (PLAVIX) 75 MG tablet Take 75 mg by mouth daily.      Marland Kitchen esomeprazole (NEXIUM) 20 MG capsule Take 1 capsule (20 mg total) by mouth daily before breakfast.  90 capsule  3  . fish oil-omega-3 fatty acids 1000 MG capsule Take 1 g by mouth 3 (three) times daily.        . insulin aspart (NOVOLOG) 100 UNIT/ML injection Inject 3-4 Units into the skin 3 (three) times daily before meals. Per sliding scale      . insulin glargine (LANTUS SOLOSTAR) 100 UNIT/ML injection Inject 36 Units into the skin at bedtime.  45 mL  3  . isosorbide mononitrate (IMDUR) 60 MG 24 hr tablet Take 1 tablet (60 mg total) by mouth daily.  90 tablet  3  . lisinopril (PRINIVIL,ZESTRIL) 40 MG tablet Take 1 tablet (40 mg total) by mouth daily.  30 tablet  5  . [DISCONTINUED] insulin glargine (LANTUS SOLOSTAR) 100 UNIT/ML injection Inject 34  Units into the skin at bedtime.  45 mL  3   No family history on file. History   Social History  . Marital Status: Single    Spouse Name: N/A    Number of Children: N/A  . Years of Education: N/A   Occupational History  . unemployed    Social History Main Topics  . Smoking status: Current Every Day Smoker -- 0.5 packs/day for 30 years    Types: Cigarettes  . Smokeless tobacco: Never Used     Comment: cutting back  . Alcohol Use: No  . Drug Use: No  . Sexually Active: None   Other Topics Concern  . None   Social History Narrative   Patient recently lost her job and is currently  without insurance. - Nov 2012   Review of Systems: Constitutional: Denies fever, chills, diaphoresis HEENT: No headache, acute visual changes Respiratory: Denies SOB, DOE, chest tightness Cardiovascular: Denies chest pain, palpitations and leg swelling.  Gastrointestinal: Denies nausea, vomiting, abdominal pain, diarrhea, constipation, blood in stool and abdominal distention.  Genitourinary: Denies dysuria, urgency, frequency,  Musculoskeletal: Denies joint swelling or redness Skin: Denies rash  Neurological: Per HPI Psychiatric/Behavioral: Denies mood change.  Objective:  Physical Exam: Filed Vitals:   03/09/12 1351  BP: 138/71  Pulse: 76  Temp: 98.4 F (36.9 C)  TempSrc: Oral  Height: 5\' 6"  (1.676 m)  Weight: 164 lb 12.8 oz (74.753 kg)  SpO2: 97%   Constitutional: Vital signs reviewed.  Patient is a well-developed and well-nourished female in no acute distress and cooperative with exam. Alert and oriented x3.  Head: Normocephalic and atraumatic Mouth: no erythema or exudates, MMM Eyes: PERRL, EOMI, conjunctivae normal, No scleral icterus.  Neck: Supple, Trachea midline normal ROM, No JVD or thyromegaly Cardiovascular: RRR, S1 normal, S2 normal, no MRG, pulses symmetric and intact bilaterally Pulmonary/Chest: CTAB, no wheezes, rales, or rhonchi Abdominal: Soft. Non-tender, non-distended, bowel sounds are normal, no masses, organomegaly, or guarding present.  Musculoskeletal: No joint deformities or erythema Hematology: no cervical LAD Neurological: Some tingling of R mouth and slight asymmetry, but full sensation and motor function. Remainder of CN, strength and sensation intact Skin: Warm, dry and intact. No rash, cyanosis, or clubbing.  Psychiatric: Normal mood and affect. speech and behavior is normal. Judgment and thought content normal. Cognition and memory are normal.   Assessment & Plan:  INTERNAL MEDICINE TEACHING ATTENDING ADDENDUM - Janell Quiet, MD: I personally  saw and evaluated Ms Hanif in this clinic visit in conjunction with the resident, Dr. Sandy Salaam. I have discussed the patient's plan of care with Dr. Sandy Salaam during this visit. I have confirmed the physical exam findings and have read and agree with the clinic note including the plan.

## 2012-04-13 ENCOUNTER — Encounter: Payer: Self-pay | Admitting: Internal Medicine

## 2012-04-13 ENCOUNTER — Ambulatory Visit (INDEPENDENT_AMBULATORY_CARE_PROVIDER_SITE_OTHER): Payer: No Typology Code available for payment source | Admitting: Internal Medicine

## 2012-04-13 VITALS — BP 134/71 | HR 94 | Temp 98.4°F | Ht 66.0 in | Wt 165.8 lb

## 2012-04-13 DIAGNOSIS — I10 Essential (primary) hypertension: Secondary | ICD-10-CM

## 2012-04-13 DIAGNOSIS — H81399 Other peripheral vertigo, unspecified ear: Secondary | ICD-10-CM | POA: Insufficient documentation

## 2012-04-13 DIAGNOSIS — E785 Hyperlipidemia, unspecified: Secondary | ICD-10-CM

## 2012-04-13 DIAGNOSIS — E119 Type 2 diabetes mellitus without complications: Secondary | ICD-10-CM

## 2012-04-13 LAB — GLUCOSE, CAPILLARY: Glucose-Capillary: 135 mg/dL — ABNORMAL HIGH (ref 70–99)

## 2012-04-13 MED ORDER — CHLORTHALIDONE 25 MG PO TABS: 25.0000 mg | ORAL_TABLET | Freq: Every day | ORAL | Status: DC

## 2012-04-13 MED ORDER — INSULIN ASPART 100 UNIT/ML ~~LOC~~ SOLN: SUBCUTANEOUS | Status: DC

## 2012-04-13 MED ORDER — ATORVASTATIN CALCIUM 40 MG PO TABS: 40.0000 mg | ORAL_TABLET | Freq: Every day | ORAL | Status: DC

## 2012-04-13 MED ORDER — ASPIRIN 81 MG PO TABS: 325.0000 mg | ORAL_TABLET | Freq: Every day | ORAL | Status: DC

## 2012-04-13 NOTE — Progress Notes (Signed)
Patient ID: Stephanie Gross, female   DOB: October 17, 1945, 66 y.o.   MRN: [id number]  Subjective:   Patient ID: Stephanie Gross female   DOB: 03/10/1946 66 y.o.   MRN: [id number]  HPI: [name] is a 66 y.o. female with past medical history of diabetes, CVA, and hypertension who presents for followup of her hypertension. The patient has a history of CVA and TIAs, with long-standing hypertension and cigarettes smoking likely contributing. I have counseled her in the past about smoking cessation. She reports that she is actively trying to cut back, and is currently smoking 3-4 cigarettes per day. She's not interested in the patch or other medication. She was given information about quitline assistance.  At her last visit, we titrated up his hypertensive therapy to include carvedilol 25 mg twice a day, chlorthalidone 25 mg daily, isosorbide mononitrate 60 mg daily and lisinopril 40 mg daily. Today which she reports that she never filled the carvedilol, and instead continues to take Accuretic twice daily in addition to isosorbide, chlorthalidone, and lisinopril. Discussed with her the importance of filling and starting beta blocker medication in light of her history of coronary artery disease. Told her she should stop taking Accuretic and instead fell the carvedilol as prescribed. Her blood pressure is much better controlled today, at 134/71. She denies any signs or symptoms of recurrent stroke or TIA. She continues on aspirin 81 mg and Plavix 75 mg per Neurology's recommendation; however, has been unable to see them since her hospital discharge due to prohibitively high co-pay.  In regards to her diabetes, patient is currently on 36 units of Lantus at bedtime and 3-4 units of mealtime NovoLog. She reports that she checks her blood sugar at home and that they usually are higher in the evenings (250 pre dinner). Per meter review, she has no episodes of hypoglycemia.Her last HbA1c in October  was 8.0, down from 8.7 at the previous check. She will be due for another HbA1c check in a month or two.   Overall, she reports she is feeling much better. She does note that over the things can holiday she had a day or 2 of nausea and vomiting, from which she feels that she's having a hard time recovering from. She reports to me that in the mornings when she turns her head or sits up suddenly she feels dizzy and fell he nauseated. The sensation is transient. She's had no visual changes, no hearing loss, no ringing in her ears. She's had no unsteady gait, sensory deficit, or focal weakness. She thinks the symptoms are improving.  Past Medical History  Diagnosis Date  . CAD (coronary artery disease)     s/p RCA stent 2007. Myoview 2010 normal  . DM (diabetes mellitus)   . HTN (hypertension)   . Hyperlipidemia   . GERD (gastroesophageal reflux disease)   . 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 aspirin 81 MG tablet Take 4 tablets (325 mg total) by mouth daily.  30 tablet  0  . atorvastatin (LIPITOR) 40 MG tablet Take 1 tablet (40 mg total) by mouth daily.  30 tablet  2  . carvedilol (COREG) 25 MG tablet Take 1 tablet (25 mg total) by mouth 2 (two) times daily.  60 tablet  11  . chlorthalidone (HYGROTON) 25 MG tablet Take 1 tablet (25 mg total) by mouth daily.  30 tablet  4  . clopidogrel (PLAVIX) 75 MG tablet Take 75 mg by mouth daily.      Marland Kitchen esomeprazole (NEXIUM) 20 MG capsule Take 1 capsule (20 mg total) by mouth daily before breakfast.  90 capsule  3  . fish oil-omega-3 fatty acids 1000 MG capsule Take 1 g by mouth 3 (three) times daily.        . insulin aspart (NOVOLOG) 100 UNIT/ML injection Inject 3-4 Units into the skin 3 (three) times daily before meals. Per sliding scale      . insulin glargine (LANTUS SOLOSTAR) 100 UNIT/ML injection Inject 36 Units into the skin at  bedtime.  45 mL  3  . isosorbide mononitrate (IMDUR) 60 MG 24 hr tablet Take 1 tablet (60 mg total) by mouth daily.  90 tablet  3  . lisinopril (PRINIVIL,ZESTRIL) 40 MG tablet Take 1 tablet (40 mg total) by mouth daily.  30 tablet  5   No family history on file. History   Social History  . Marital Status: Single    Spouse Name: N/A    Number of Children: N/A  . Years of Education: N/A   Occupational History  . unemployed    Social History Main Topics  . Smoking status: Current Every Day Smoker -- 0.5 packs/day for 30 years    Types: Cigarettes  . Smokeless tobacco: Never Used     Comment: cutting back  . Alcohol Use: No  . Drug Use: No  . Sexually Active: None   Other Topics Concern  . None   Social History Narrative   Patient recently lost her job and is currently without insurance. - Nov 2012   Review of Systems: 10 pt ROS performed, pertinent positives and negatives noted in HPI   Objective:  Physical Exam: Filed Vitals:   04/13/12 1325  BP: 134/71  Pulse: 94  Temp: 98.4 F (36.9 C)  TempSrc: Oral  Height: 5\' 6"  (1.676 m)  Weight: 165 lb 12.8 oz (75.206 kg)  SpO2: 97%   Vitals reviewed. General: sitting in chair, NAD HEENT: PERRL, EOMI, no scleral icterus. TMs pearly bilaterally with no erythema or effusion. MMM of OP.  Cardiac: RRR, no rubs, murmurs or gallops Pulm: clear to auscultation bilaterally, no wheezes, rales, or rhonchi Abd: soft, nontender, nondistended, BS present Ext: warm and well perfused, no pedal edema Neuro: alert and oriented X3, cranial nerves II-XII grossly intact, strength and sensation to light touch equal in bilateral upper and lower extremities. No nystagmus or ataxia appreciated.     Assessment & Plan:

## 2012-04-13 NOTE — Assessment & Plan Note (Signed)
Patient symptoms of transient dizziness and nausea with turning of head or sitting up suddenly seem consistent with BPPV. She has no unsteady gait, no headache, no visual changes, or ataxia to suggest a central etiology of vertigo. She denies any hearing loss or ringing in her ears that would be concerning for Mnire's disease. She does report symptoms that sound consistent of recent viral illness, so vestibular neuronitis may also be a possible cause of her symptoms. At any rate, symptoms are transient and seem to be improving. -Demonstrated Epley's maneuver the patient and provided handout on instructions to perform at home. -Patient instructed to return to clinic if symptoms do not improve or worsen. She may benefit from additional antihistamine if maneuvers fail. If symptoms worsen, she may also need further workup in light of history of multiple CVAs, although I do not suspect central etiology this time.

## 2012-04-13 NOTE — Assessment & Plan Note (Signed)
Lab Results  Component Value Date   NA 143 02/07/2012   K 4.1 02/07/2012   CL 109 02/07/2012   CO2 24 02/07/2012   BUN 18 02/07/2012   CREATININE 1.00 02/07/2012   CREATININE 1.16* 12/23/2011    BP Readings from Last 3 Encounters:  04/13/12 134/71  03/09/12 138/71  02/13/12 145/72    Assessment: Hypertension control:  controlled  Progress toward goals:  at goal Barriers to meeting goals:  lack of understanding of disease management  Plan: Hypertension treatment:  Patient is to continue the medication she is currently prescribed. Patient was instructed to stop taking Accuretic, which have been discontinued her prior visit and to fill the prescription for carvedilol which have been prescribed for her previously. She should be taking carvedilol, Imdur, chlorthalidone, and lisinopril for hypertension control.

## 2012-04-13 NOTE — Assessment & Plan Note (Signed)
Lab Results  Component Value Date   HGBA1C 8.0* 02/07/2012   HGBA1C 8.7 12/23/2011   CREATININE 1.00 02/07/2012   CREATININE 1.16* 12/23/2011   MICROALBUR 10.71* 12/23/2011   MICRALBCREAT 68.9* 12/23/2011   CHOL 220* 02/08/2012   HDL 36* 02/08/2012   TRIG 318* 02/08/2012    Last eye exam and foot exam: Last eye exam 09/2011, mild nonproliferative DM retinopathy OU  Assessment: Diabetes control: not controlled Progress toward goals: improved Barriers to meeting goals: no barriers identified  Plan: Diabetes treatment: Instructed patient to continue taking 36 units of Lantus at night. Instructed to increase mealtime NovoLog coverage by 1-2 units at dinner only. Continue checking blood sugar 3 times a day and bring meter to clinic. She will need an HbA1c at her next clinic visit. Refer to: none Instruction/counseling given: reminded to bring blood glucose meter & log to each visit

## 2012-04-13 NOTE — Patient Instructions (Addendum)
General Instructions: 1) STOP TAKING Accuretic. For your blood pressure please take: Carvedilol 25mg  TWICE daily, Lisinopril 40mg  (2 tablets) once daily, isosorbide mononitrate 60mg  once daily, and chlorthalidone 25mg  once daily. 2) Please keep checking your blood sugar. You can increase your EVENING novolog dose by 1-2 units as needed per the sliding scale. 3) Please come back to see me in about 2 months.  4) Please continue to cut back on smoking as we discussed.  5) Refer to the handout for information about self-treatment of vertigo  Treatment Goals:  Goals (1 Years of Data) as of 04/13/2012          As of Today 03/09/12 02/13/12 02/08/12 02/08/12     Blood Pressure    . Blood Pressure < 140/80  134/71 138/71 145/72 149/66 169/65     Result Component    . HEMOGLOBIN A1C < 7.0      8.0    . LDL CALC < 70     120       Progress Toward Treatment Goals:  Improved blood sugar and blood pressure control.     Self Care Goals & Plans:  Self Care Goal 04/13/2012  Manage my medications take my medicines as prescribed; bring my medications to every visit; refill my medications on time  Monitor my health keep track of my blood glucose; bring my glucose meter and log to each visit  Eat healthy foods drink diet soda or water instead of juice or soda; eat more vegetables  Be physically active find an activity I enjoy  Stop smoking go to the [employer] ([email or web address]

## 2012-04-16 ENCOUNTER — Telehealth: Payer: Self-pay | Admitting: *Deleted

## 2012-04-16 NOTE — Telephone Encounter (Signed)
Needs refill on Accuretic 20/12.5mg  - written 08/01/11 and last refill 03/15/12.

## 2012-04-17 NOTE — Telephone Encounter (Signed)
Patient should NOT be taking Accuretic any longer. I instructed her to discontinue this medication and pick up carvedilol which had been called into her pharmacy. I will call her and remind that she should not be taking.

## 2012-04-26 ENCOUNTER — Other Ambulatory Visit: Payer: Self-pay | Admitting: *Deleted

## 2012-04-26 ENCOUNTER — Telehealth: Payer: Self-pay | Admitting: Internal Medicine

## 2012-04-26 MED ORDER — ESOMEPRAZOLE MAGNESIUM 20 MG PO CPDR: 20.0000 mg | DELAYED_RELEASE_CAPSULE | Freq: Every day | ORAL | Status: DC

## 2012-04-26 NOTE — Telephone Encounter (Signed)
Rx called in to pharmacy. 

## 2012-04-26 NOTE — Telephone Encounter (Signed)
This note was faxed to [facility] at [facility].

## 2012-04-26 NOTE — Telephone Encounter (Signed)
Spoke w Stephanie Gross 05/26/11 regarding blood pressure medications. She reports to me that she filled her carvedilol but it made her feel very fatigued. She has been on chronic beta blocker therapy, but I suspect has not been compliant in the past and that 25mg  was too high of an initial dose. Instructed her to decrease dose from 25mg  BID to 12.5mg  BID and monitor symptoms. [place] to communicate change. Will titrate up as able at follow-up.

## 2012-05-03 ENCOUNTER — Ambulatory Visit: Payer: Self-pay

## 2012-05-27 ENCOUNTER — Other Ambulatory Visit: Payer: Self-pay | Admitting: Internal Medicine

## 2012-05-28 NOTE — Telephone Encounter (Signed)
She had med refilled in 04/13/12 with 4 additional refills. Should still have 3 months worth of Rx

## 2012-07-05 ENCOUNTER — Telehealth: Payer: Self-pay | Admitting: *Deleted

## 2012-07-05 NOTE — Telephone Encounter (Signed)
Review of record is notable for the plan to continue the Imdur 60 mg PO QD at the last clinic follow-up on 04/13/2012.  Thus, she should be taking the Imdur.  Please call her and re-enforce Dr. Joeseph Amor plan to continue the Imdur.  Thanks.

## 2012-07-05 NOTE — Telephone Encounter (Signed)
Call from [facility] for clarification on Imdur Rx. Pt states she thought it was d/c and she has not taken since October/November. Do you want her to restart Imdur? Please advise.

## 2012-07-07 NOTE — Telephone Encounter (Signed)
Pt has appointment this Friday for med rec.  She is not taking imdur and wants to talk to PCP about this matter.

## 2012-07-09 ENCOUNTER — Encounter: Payer: Self-pay | Admitting: Internal Medicine

## 2012-07-09 ENCOUNTER — Ambulatory Visit (INDEPENDENT_AMBULATORY_CARE_PROVIDER_SITE_OTHER): Payer: PRIVATE HEALTH INSURANCE | Admitting: Internal Medicine

## 2012-07-09 VITALS — BP 146/69 | HR 73 | Temp 98.2°F | Wt 162.6 lb

## 2012-07-09 DIAGNOSIS — I10 Essential (primary) hypertension: Secondary | ICD-10-CM

## 2012-07-09 DIAGNOSIS — E119 Type 2 diabetes mellitus without complications: Secondary | ICD-10-CM

## 2012-07-09 DIAGNOSIS — Z79899 Other long term (current) drug therapy: Secondary | ICD-10-CM

## 2012-07-09 LAB — BASIC METABOLIC PANEL WITH GFR
CO2: 23 mEq/L (ref 19–32)
Chloride: 108 mEq/L (ref 96–112)
Creat: 1.06 mg/dL (ref 0.50–1.10)
Glucose, Bld: 174 mg/dL — ABNORMAL HIGH (ref 70–99)
Sodium: 137 mEq/L (ref 135–145)

## 2012-07-09 LAB — GLUCOSE, CAPILLARY: Glucose-Capillary: 174 mg/dL — ABNORMAL HIGH (ref 70–99)

## 2012-07-09 MED ORDER — AGAMATRIX PRESTO PRO METER DEVI: 1.0000 | Freq: Four times a day (QID) | Status: DC

## 2012-07-09 MED ORDER — GLUCOSE BLOOD VI STRP: ORAL_STRIP | Status: DC

## 2012-07-09 MED ORDER — INSULIN GLARGINE 100 UNIT/ML ~~LOC~~ SOLN: 38.0000 [IU] | Freq: Every day | SUBCUTANEOUS | Status: DC

## 2012-07-09 NOTE — Assessment & Plan Note (Addendum)
BP Readings from Last 3 Encounters:  07/09/12 146/69  04/13/12 134/71  03/09/12 138/71    Lab Results  Component Value Date   NA 143 02/07/2012   K 4.1 02/07/2012   CREATININE 1.00 02/07/2012    Assessment:  Blood pressure control:  Mildly elevated    Plan:  Medications:  continue current medications  Educational resources provided: brochure  Self management tools provided: home blood pressure logbook  chlorthalidone 25mg , lisinopril 40mg , imdur 60mg , and carvedilol 25mg  twice a day

## 2012-07-09 NOTE — Assessment & Plan Note (Addendum)
Lab Results  Component Value Date   HGBA1C 8.6 07/09/2012   HGBA1C 8.0* 02/07/2012   HGBA1C 8.7 12/23/2011     Assessment:  Diabetes control:  Uncontrolled   Progress toward A1C goal:   Deteriorated  Comments: Dietary indescretions  Plan:  Medications:  Increase lantus from 36 to 38 U at night, increase mealtime novolog to 5 units with each meal  Home glucose monitoring:   Frequency:   4 times a day   Timing:   before breakfast and after each meal  Instruction/counseling given: reminded to bring blood glucose meter & log to each visit  Educational resources provided: brochure

## 2012-07-09 NOTE — Progress Notes (Signed)
Patient ID: Stephanie Gross, female   DOB: 08-07-1945, 67 y.o.   MRN: [id number]  Subjective:   Patient ID: Stephanie Gross female   DOB: 1946-01-19 67 y.o.   MRN: [id number]  HPI: [name] is a 67 y.o. female with past medical history of diabetes, CAD, CVA, and hypertension who presents for followup of her hypertension and DM.  The patient has a history of CVA and TIAs, with long-standing hypertension and cigarettes smoking likely contributing. I have counseled her in the past about smoking cessation. She is still trying to cutback, and has been given information and resources in the past. No interested in meds to assist in quitting now.   Her current hypertensive therapy includes carvedilol 25 mg twice a day, chlorthalidone 25 mg daily, isosorbide mononitrate 60 mg daily and lisinopril 40 mg daily. She denies any signs or symptoms of recurrent stroke or TIA. She denies headaches, chest pain, SOB, palpitations. She continues on aspirin 81 mg and Plavix 75 mg in setting of stroke and CAD.  In regards to her diabetes, patient is currently on 36 units of Lantus at bedtime and 3-4 units of mealtime NovoLog. She reports that she checks her blood sugar at home and fasting sugars are usually 150-200 and in 200s postprandially. She did not bring in meter today. She thinks that her diet has been poorer over the holidays but is trying to eat better. No symptoms of hypoglycemia.   Past Medical History  Diagnosis Date  . CAD (coronary artery disease)     s/p RCA stent 2007. Myoview 2010 normal  . DM (diabetes mellitus)   . HTN (hypertension)   . Hyperlipidemia   . GERD (gastroesophageal reflux disease)   . 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 aspirin 81 MG tablet Take 4 tablets (325 mg total) by mouth daily.  30 tablet  0  . atorvastatin (LIPITOR) 40 MG tablet  Take 1 tablet (40 mg total) by mouth daily.  30 tablet  2  . Blood Glucose Monitoring Suppl (AGAMATRIX PRESTO PRO METER) DEVI 1 Device by Does not apply route 4 (four) times daily.  1 Device  0  . carvedilol (COREG) 25 MG tablet Take 12.5 mg by mouth 2 (two) times daily with a meal.      . chlorthalidone (HYGROTON) 25 MG tablet Take 1 tablet (25 mg total) by mouth daily.  30 tablet  4  . chlorthalidone (HYGROTON) 25 MG tablet TAKE ONE TABLET BY MOUTH EVERY DAY  30 tablet  3  . clopidogrel (PLAVIX) 75 MG tablet Take 75 mg by mouth daily.      Marland Kitchen esomeprazole (NEXIUM) 20 MG capsule Take 1 capsule (20 mg total) by mouth daily before breakfast.  90 capsule  3  . fish oil-omega-3 fatty acids 1000 MG capsule Take 1 g by mouth 3 (three) times daily.        Marland Kitchen glucose blood (AGAMATRIX PRESTO TEST) test strip Use as instructed  100 each  12  . insulin aspart (NOVOLOG) 100 UNIT/ML injection Inject 3-6 units before mealtimes per sliding scale.  1 vial  10  . insulin glargine (LANTUS SOLOSTAR) 100 UNIT/ML injection Inject 38 Units into the skin at bedtime.  45 mL  3  . isosorbide mononitrate (IMDUR) 60 MG 24 hr tablet Take 1 tablet (60 mg total) by mouth daily.  90 tablet  3  . lisinopril (PRINIVIL,ZESTRIL) 40 MG tablet Take 1 tablet (40 mg total) by mouth daily.  30 tablet  5   No current facility-administered medications for this visit.   No family history on file. History   Social History  . Marital Status: Single    Spouse Name: N/A    Number of Children: N/A  . Years of Education: N/A   Occupational History  . unemployed    Social History Main Topics  . Smoking status: Current Every Day Smoker -- 0.30 packs/day for 30 years    Types: Cigarettes  . Smokeless tobacco: Never Used     Comment: cutting back  . Alcohol Use: No  . Drug Use: No  . Sexually Active: None   Other Topics Concern  . None   Social History Narrative   Patient recently lost her job and is currently without insurance.  - Nov 2012   Review of Systems: 10 pt ROS performed, pertinent positives and negatives noted in HPI Objective:  Physical Exam: Filed Vitals:   07/09/12 1024  BP: 146/69  Pulse: 73  Temp: 98.2 F (36.8 C)  TempSrc: Oral  Weight: 162 lb 9.6 oz (73.755 kg)  SpO2: 98%   General: sitting in chair, NAD  HEENT: PERRL, EOMI, no scleral icterus. MMM of OP.  Cardiac: RRR, no rubs, murmurs or gallops  Pulm: clear to auscultation bilaterally, no wheezes, rales, or rhonchi  Abd: soft, nontender, nondistended, BS present  Ext: warm and well perfused, no pedal edema  Neuro: alert and oriented X3, cranial nerves II-XII grossly intact, strength and sensation to light touch equal in bilateral upper and lower extremities.    Assessment & Plan:   Please see problem-based charting for assessment and plan.

## 2012-07-09 NOTE — Patient Instructions (Addendum)
1. Keep taking your medications for blood pressure: chlorthalidone 25mg , lisinopril 40mg , imdur 60mg , and carvedilol 25mg  twice a day 2. Increase your Lantus to 38 units at night and take 5 units of novolog with each meal. 3. Keep measuring your blood sugar and bring your meter to your next visit. 4. Come see me again in 3 months or sooner if needed.

## 2012-07-29 ENCOUNTER — Other Ambulatory Visit: Payer: Self-pay | Admitting: Internal Medicine

## 2012-08-16 ENCOUNTER — Other Ambulatory Visit: Payer: Self-pay | Admitting: *Deleted

## 2012-08-16 MED ORDER — CLOPIDOGREL BISULFATE 75 MG PO TABS: 75.0000 mg | ORAL_TABLET | Freq: Every day | ORAL | Status: DC

## 2012-08-26 ENCOUNTER — Other Ambulatory Visit: Payer: Self-pay | Admitting: *Deleted

## 2012-08-26 ENCOUNTER — Other Ambulatory Visit: Payer: Self-pay | Admitting: Internal Medicine

## 2012-08-26 DIAGNOSIS — I10 Essential (primary) hypertension: Secondary | ICD-10-CM

## 2012-08-26 MED ORDER — LISINOPRIL 20 MG PO TABS: 20.0000 mg | ORAL_TABLET | Freq: Two times a day (BID) | ORAL | Status: DC

## 2012-08-26 NOTE — Telephone Encounter (Signed)
Pt states she is completely out of med

## 2012-09-07 ENCOUNTER — Other Ambulatory Visit: Payer: Self-pay | Admitting: *Deleted

## 2012-09-07 DIAGNOSIS — E119 Type 2 diabetes mellitus without complications: Secondary | ICD-10-CM

## 2012-09-07 DIAGNOSIS — E785 Hyperlipidemia, unspecified: Secondary | ICD-10-CM

## 2012-09-07 MED ORDER — INSULIN ASPART 100 UNIT/ML ~~LOC~~ SOLN: SUBCUTANEOUS | Status: DC

## 2012-09-07 MED ORDER — ROSUVASTATIN CALCIUM 10 MG PO TABS: 10.0000 mg | ORAL_TABLET | Freq: Every day | ORAL | Status: DC

## 2012-09-07 MED ORDER — CARVEDILOL 25 MG PO TABS: 12.5000 mg | ORAL_TABLET | Freq: Two times a day (BID) | ORAL | Status: DC

## 2012-09-07 NOTE — Telephone Encounter (Signed)
[facility] states they need new rx for Novolog flexpen since they have not refilled it since Dec 2013; to make sure pt is still on this med. Thanks

## 2012-09-07 NOTE — Telephone Encounter (Signed)
Crestor, Novolog, and Coreg rxs called to [facility].

## 2012-09-07 NOTE — Telephone Encounter (Signed)
[facility] can only provide Crestor for free - will need new rx if u decide to change medications.

## 2012-09-07 NOTE — Addendum Note (Signed)
Addended by: Trinda Pascal on: 09/07/2012 11:48 AM   Modules accepted: Orders

## 2012-09-08 ENCOUNTER — Other Ambulatory Visit: Payer: Self-pay | Admitting: *Deleted

## 2012-09-08 ENCOUNTER — Telehealth: Payer: Self-pay | Admitting: *Deleted

## 2012-09-08 DIAGNOSIS — E119 Type 2 diabetes mellitus without complications: Secondary | ICD-10-CM

## 2012-09-08 MED ORDER — INSULIN ASPART 100 UNIT/ML ~~LOC~~ SOLN: SUBCUTANEOUS | Status: DC

## 2012-09-08 MED ORDER — ROSUVASTATIN CALCIUM 10 MG PO TABS: 10.0000 mg | ORAL_TABLET | Freq: Every day | ORAL | Status: DC

## 2012-09-08 NOTE — Telephone Encounter (Signed)
Fax [facility] - 1. Need rxs faxed to their office;  2. On Novolog rx - need sliding scale orders and "ok to restart with these doses" needs to be indicated on the new rx  Since she has not refill med since Dec 2013 and 3. Crestor rx  - needs to indicated on new rx "ok to change from Lipitor to Crestor".  Sorry Thanks

## 2012-09-08 NOTE — Telephone Encounter (Signed)
Call from [place] at the [facility] , needs the rxs faxed to them not call.  Sorry Thanks

## 2012-09-08 NOTE — Telephone Encounter (Signed)
Novolog and Crestor rxs (hard copies) faxed to [facility].

## 2012-09-09 MED ORDER — INSULIN ASPART 100 UNIT/ML ~~LOC~~ SOLN: SUBCUTANEOUS | Status: DC

## 2012-09-09 NOTE — Addendum Note (Signed)
Addended by: Trinda Pascal on: 09/09/2012 01:24 PM   Modules accepted: Orders

## 2012-09-09 NOTE — Telephone Encounter (Signed)
Pt has been using novolog flex pen.  I gave order to change from vial to pen.  Can you change in EPIC?

## 2012-09-21 ENCOUNTER — Encounter: Payer: Self-pay | Admitting: Internal Medicine

## 2012-09-21 ENCOUNTER — Ambulatory Visit (INDEPENDENT_AMBULATORY_CARE_PROVIDER_SITE_OTHER): Payer: PRIVATE HEALTH INSURANCE | Admitting: Internal Medicine

## 2012-09-21 VITALS — BP 145/72 | HR 70 | Temp 97.4°F | Ht 66.0 in | Wt 163.5 lb

## 2012-09-21 DIAGNOSIS — E119 Type 2 diabetes mellitus without complications: Secondary | ICD-10-CM

## 2012-09-21 DIAGNOSIS — F17200 Nicotine dependence, unspecified, uncomplicated: Secondary | ICD-10-CM

## 2012-09-21 DIAGNOSIS — Z23 Encounter for immunization: Secondary | ICD-10-CM

## 2012-09-21 DIAGNOSIS — Z0000 Encounter for general adult medical examination without abnormal findings: Secondary | ICD-10-CM

## 2012-09-21 DIAGNOSIS — I10 Essential (primary) hypertension: Secondary | ICD-10-CM

## 2012-09-21 DIAGNOSIS — E1065 Type 1 diabetes mellitus with hyperglycemia: Secondary | ICD-10-CM

## 2012-09-21 DIAGNOSIS — E785 Hyperlipidemia, unspecified: Secondary | ICD-10-CM

## 2012-09-21 MED ORDER — NICOTINE 21 MG/24HR TD PT24: 1.0000 | MEDICATED_PATCH | TRANSDERMAL | Status: DC

## 2012-09-21 NOTE — Assessment & Plan Note (Signed)
  Assessment: Progress toward smoking cessation:  smoking the same amount Barriers to progress toward smoking cessation:  withdrawal symptoms Comments: smokes 1/3ppd  Plan: Instruction/counseling given:  I counseled patient on the dangers of tobacco use, advised patient to stop smoking and reviewed strategies to maximize success. Educational resources provided:  [employer] account manager) brochure Self management tools provided:    Medications to assist with smoking cessation:  Nicotine Patch Patient agreed to the following self-care plans for smoking cessation: go to the [employer] (https://scott-booker.info/)  Other plans:

## 2012-09-21 NOTE — Assessment & Plan Note (Signed)
Lab Results  Component Value Date   CHOL 220* 02/08/2012   HDL 36* 02/08/2012   LDLCALC 120* 02/08/2012   LDLDIRECT 111* 09/15/2008   TRIG 318* 02/08/2012   CHOLHDL 6.1 02/08/2012   Patient was supposed to be taking Crestor since formulary change but has not filled meds and has been without lipid lowering therapy at home. Will defer lipid panel until next visit after she has filled and started taking crestor. Titrate up as necessary.

## 2012-09-21 NOTE — Assessment & Plan Note (Addendum)
Lab Results  Component Value Date   HGBA1C 8.6 07/09/2012   HGBA1C 8.0* 02/07/2012   HGBA1C 8.7 12/23/2011     Assessment: Diabetes control: fair control Progress toward A1C goal:  unable to assess Comments: Has run out of insulin, not been taking as prescribed to make it last  Plan: Medications:  continue current medications I have provided sample of lantus (38U) and novolog (3-6 w meals) today. She will fill Rx when available through [facility]. Return to clinic in 3-4 weeks for followup.  Home glucose monitoring: Frequency:   Timing:   Instruction/counseling given: reminded to get eye exam Educational resources provided: brochure

## 2012-09-21 NOTE — Progress Notes (Signed)
Patient ID: Stephanie Gross, female   DOB: 09/01/1945, 67 y.o.   MRN: [id number]  Subjective:   Patient ID: Stephanie Gross female   DOB: December 13, 1945 67 y.o.   MRN: [id number]  HPI: [name] is a 67 y.o. female with past medical history of diabetes, CAD s/p PCA, CVA, and hypertension who presents for followup of her hypertension and DM.  In regards to her diabetes, patient is currently supposed to be taking 38 units of Lantus at bedtime and 3-4 units of mealtime NovoLog. She has not been taking these meds as prescribed because of issues filling insulins at [facility]. She did not bring in meter today.   She has also not filled her cholesterol medicine (crestor) due to issues with the [facility] program.  In terms of her blood pressure, her current hypertensive therapy includes carvedilol 25 mg twice a day, chlorthalidone 25 mg daily, isosorbide mononitrate 60 mg daily and lisinopril 40 mg daily. She denies any signs or symptoms of recurrent stroke or TIA. She denies headaches, chest pain, SOB, palpitations. She continues on aspirin 81 mg and Plavix 75 mg in setting of stroke and CAD.  I have counseled her in the past about smoking cessation. She continues to smoke 1/3ppd. In the past she has tried NRT and had 2 year period of smoking cessation with the patch. She is interested in trying patch again today.    Past Medical History  Diagnosis Date  . CAD (coronary artery disease)     s/p RCA stent 2007. Myoview 2010 normal  . DM (diabetes mellitus)   . HTN (hypertension)   . Hyperlipidemia   . GERD (gastroesophageal reflux disease)   . 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 aspirin 81 MG tablet Take 4 tablets (325 mg total) by mouth daily.  30 tablet  0  . atorvastatin (LIPITOR) 40 MG tablet Take 1 tablet (40 mg total) by mouth daily.  30 tablet  2  .  Blood Glucose Monitoring Suppl (AGAMATRIX PRESTO PRO METER) DEVI 1 Device by Does not apply route 4 (four) times daily.  1 Device  0  . carvedilol (COREG) 25 MG tablet Take 0.5 tablets (12.5 mg total) by mouth 2 (two) times daily with a meal.  90 tablet  3  . chlorthalidone (HYGROTON) 25 MG tablet Take 1 tablet (25 mg total) by mouth daily.  30 tablet  4  . chlorthalidone (HYGROTON) 25 MG tablet TAKE ONE TABLET BY MOUTH EVERY DAY  30 tablet  3  . clopidogrel (PLAVIX) 75 MG tablet Take 1 tablet (75 mg total) by mouth daily.  30 tablet  5  . esomeprazole (NEXIUM) 20 MG capsule Take 1 capsule (20 mg total) by mouth daily before breakfast.  90 capsule  3  . fish oil-omega-3 fatty acids 1000 MG capsule Take 1 g by mouth 3 (three) times daily.        Marland Kitchen glucose blood (AGAMATRIX PRESTO TEST) test strip Use as instructed  100 each  12  . insulin aspart (NOVOLOG) 100 UNIT/ML injection Inject 3-6 units before mealtimes per sliding scale. Inject 1U per carbohydrate and 2 units per 50mg /dL increment blood sugar over 150  1 pen  12  . insulin glargine (LANTUS SOLOSTAR) 100 UNIT/ML injection Inject 38 Units into the skin at bedtime.  45 mL  3  . isosorbide mononitrate (IMDUR)  60 MG 24 hr tablet Take 1 tablet (60 mg total) by mouth daily.  90 tablet  3  . lisinopril (PRINIVIL,ZESTRIL) 20 MG tablet Take 1 tablet (20 mg total) by mouth 2 (two) times daily.  60 tablet  5  . rosuvastatin (CRESTOR) 10 MG tablet Take 1 tablet (10 mg total) by mouth at bedtime.  30 tablet  6   No current facility-administered medications for this visit.   No family history on file. History   Social History  . Marital Status: Single    Spouse Name: N/A    Number of Children: N/A  . Years of Education: N/A   Occupational History  . unemployed    Social History Main Topics  . Smoking status: Current Every Day Smoker -- 0.30 packs/day for 30 years    Types: Cigarettes  . Smokeless tobacco: Never Used     Comment: cutting back   . Alcohol Use: No  . Drug Use: No  . Sexually Active: None   Other Topics Concern  . None   Social History Narrative   Patient recently lost her job and is currently without insurance. - Nov 2012   Review of Systems: 10 pt ROS performed, pertinent positives and negatives noted in HPI Objective:  Physical Exam: Filed Vitals:   09/21/12 1340  BP: 145/72  Pulse: 70  Temp: 97.4 F (36.3 C)  TempSrc: Oral  Height: 5\' 6"  (1.676 m)  Weight: 163 lb 8 oz (74.163 kg)  SpO2: 98%   General: sitting in chair, NAD  HEENT: PERRL, EOMI, no scleral icterus. MMM of OP.  Cardiac: RRR, no rubs, murmurs or gallops  Pulm: clear to auscultation bilaterally, no wheezes, rales, or rhonchi  Abd: soft, nontender, nondistended, BS present  Ext: warm and well perfused, no pedal edema  Neuro: alert and oriented X3, cranial nerves II-XII grossly intact, strength and sensation to light touch equal in bilateral upper and lower extremities.   Assessment & Plan:   Please see problem-based charting for assessment and plan.

## 2012-09-21 NOTE — Patient Instructions (Addendum)
1. Please go to [facility] program in 2-3 days to pick up your insulins. In the meantime, use the pens that I've given you today. 2. Please also fill your cholesterol medicine 3. Call your eye doctor to make a follow-up appointment. 4. Come back to see me in about 3-4 weeks after you've filled and taken your medications.

## 2012-09-21 NOTE — Assessment & Plan Note (Signed)
Tdap today

## 2012-09-21 NOTE — Assessment & Plan Note (Signed)
BP Readings from Last 3 Encounters:  09/21/12 145/72  07/09/12 146/69  04/13/12 134/71    Lab Results  Component Value Date   NA 137 07/09/2012   K 4.1 07/09/2012   CREATININE 1.06 07/09/2012    Assessment: Blood pressure control: mildly elevated Progress toward BP goal:   unchanged  Comments: much better control than prior  Plan: Medications:  continue current medications Educational resources provided: brochure Self management tools provided:

## 2012-09-24 NOTE — Progress Notes (Signed)
Case discussed with Dr. Sandy Salaam at the time of the visit, immediately after the resident saw the patient.  I reviewed the resident's history and exam and pertinent patient test results.  I agree with the assessment, diagnosis and plan of care documented in the resident's note.

## 2012-09-30 ENCOUNTER — Other Ambulatory Visit: Payer: Self-pay | Admitting: *Deleted

## 2012-09-30 NOTE — Telephone Encounter (Signed)
Pt states that [facility] still has not received the supply of Crestor.  Pt has not taken any med for cholesterol in a month.   Can you call in just a 1 month supply to [facility] ?  Her meds should be in [facility] by that time.

## 2012-10-01 MED ORDER — ROSUVASTATIN CALCIUM 10 MG PO TABS: 10.0000 mg | ORAL_TABLET | Freq: Every day | ORAL | Status: DC

## 2012-10-11 ENCOUNTER — Emergency Department (HOSPITAL_COMMUNITY)
Admission: EM | Admit: 2012-10-11 | Discharge: 2012-10-11 | Disposition: A | Discharge status: Home / Self Care | Payer: Medicare Other | Attending: Emergency Medicine | Admitting: Emergency Medicine

## 2012-10-11 ENCOUNTER — Encounter (HOSPITAL_COMMUNITY): Payer: Self-pay | Admitting: Emergency Medicine

## 2012-10-11 ENCOUNTER — Emergency Department (HOSPITAL_COMMUNITY): Payer: Medicare Other

## 2012-10-11 DIAGNOSIS — E119 Type 2 diabetes mellitus without complications: Secondary | ICD-10-CM | POA: Insufficient documentation

## 2012-10-11 DIAGNOSIS — M25569 Pain in unspecified knee: Secondary | ICD-10-CM | POA: Insufficient documentation

## 2012-10-11 DIAGNOSIS — Z7982 Long term (current) use of aspirin: Secondary | ICD-10-CM | POA: Insufficient documentation

## 2012-10-11 DIAGNOSIS — Z79899 Other long term (current) drug therapy: Secondary | ICD-10-CM | POA: Insufficient documentation

## 2012-10-11 DIAGNOSIS — I10 Essential (primary) hypertension: Secondary | ICD-10-CM | POA: Insufficient documentation

## 2012-10-11 DIAGNOSIS — E785 Hyperlipidemia, unspecified: Secondary | ICD-10-CM | POA: Insufficient documentation

## 2012-10-11 DIAGNOSIS — Z7902 Long term (current) use of antithrombotics/antiplatelets: Secondary | ICD-10-CM | POA: Insufficient documentation

## 2012-10-11 DIAGNOSIS — M25562 Pain in left knee: Secondary | ICD-10-CM

## 2012-10-11 DIAGNOSIS — Z794 Long term (current) use of insulin: Secondary | ICD-10-CM | POA: Insufficient documentation

## 2012-10-11 DIAGNOSIS — K219 Gastro-esophageal reflux disease without esophagitis: Secondary | ICD-10-CM | POA: Insufficient documentation

## 2012-10-11 DIAGNOSIS — Z96659 Presence of unspecified artificial knee joint: Secondary | ICD-10-CM | POA: Insufficient documentation

## 2012-10-11 DIAGNOSIS — I2510 Atherosclerotic heart disease of native coronary artery without angina pectoris: Secondary | ICD-10-CM | POA: Insufficient documentation

## 2012-10-11 DIAGNOSIS — Z9861 Coronary angioplasty status: Secondary | ICD-10-CM | POA: Insufficient documentation

## 2012-10-11 DIAGNOSIS — F17200 Nicotine dependence, unspecified, uncomplicated: Secondary | ICD-10-CM | POA: Insufficient documentation

## 2012-10-11 DIAGNOSIS — Z8673 Personal history of transient ischemic attack (TIA), and cerebral infarction without residual deficits: Secondary | ICD-10-CM | POA: Insufficient documentation

## 2012-10-11 MED ORDER — PROMETHAZINE HCL 25 MG PO TABS
25.0000 mg | ORAL_TABLET | Freq: Four times a day (QID) | ORAL | Status: DC | PRN
Start: 2012-10-11 — End: 2013-09-16

## 2012-10-11 MED ORDER — HYDROCODONE-ACETAMINOPHEN 5-325 MG PO TABS: 1.0000 | ORAL_TABLET | Freq: Four times a day (QID) | ORAL | Status: DC | PRN

## 2012-10-11 MED ORDER — ACETAMINOPHEN 500 MG PO TABS
1000.0000 mg | ORAL_TABLET | Freq: Once | ORAL | Status: AC
  Administered 2012-10-11: 1000 mg via ORAL
  Filled 2012-10-11: qty 2

## 2012-10-11 NOTE — ED Notes (Addendum)
Pt c/o left knee pain and stiffness, is constant, Hx of left knee replacement, states she feels a clicking in knee with movement. No deformities noted.

## 2012-10-11 NOTE — ED Notes (Signed)
Patient transported to X-ray 

## 2012-10-11 NOTE — ED Provider Notes (Signed)
History     CSN: [id number]  Arrival date & time 10/11/12  [id number]   First MD Initiated Contact with Patient 10/11/12 [phone]      Chief Complaint  Patient presents with  . Knee Pain    (Consider location/radiation/quality/duration/timing/severity/associated sxs/prior treatment) HPI Comments: Patient is a 67 year old female presents today with one week of left knee pain and swelling. 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 She has never had problems with this knee after the knee replacement. She has been icing her knee as well as taking Advil. The pain continues to get worse. She has also been using an Ace wrap around her knee. Touching her knee and walking make the pain worse. She describes it as a stiffness and sharp sensitivity. No fevers, chills, nausea, vomiting, shortness of breath, calf pain.  Patient is a 67 y.o. female presenting with knee pain.  Knee Pain Associated symptoms: no fever     Past Medical History  Diagnosis Date  . CAD (coronary artery disease)     s/p RCA stent 2007. Myoview 2010 normal  . DM (diabetes mellitus)   . HTN (hypertension)   . Hyperlipidemia   . GERD (gastroesophageal reflux disease)   . 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 Coronary angioplasty with stent placement    . Abdominal hysterectomy    . Shoulder surgery    . Knee surgery      History reviewed. No pertinent family history.  History  Substance Use Topics  . Smoking status: Current Every Day Smoker -- 0.30 packs/day for 30 years    Types: Cigarettes  . Smokeless tobacco: Never Used     Comment: cutting back  . Alcohol Use: No    OB History   Grav Para Term Preterm Abortions TAB SAB Ect Mult Living                  Review of Systems  Constitutional: Negative for fever and chills.  Respiratory: Negative for shortness of  breath.   Cardiovascular: Negative for chest pain.  Musculoskeletal: Positive for myalgias, joint swelling, arthralgias and gait problem.  All other systems reviewed and are negative.    Allergies  Chantix and Codeine  Home Medications   Current Outpatient Rx  Name  Route  Sig  Dispense  Refill  . aspirin EC 81 MG tablet   Oral   Take 81 mg by mouth daily.         . carvedilol (COREG) 25 MG tablet   Oral   Take 25 mg by mouth 2 (two) times daily with a meal.         . chlorthalidone (HYGROTON) 25 MG tablet   Oral   Take 25 mg by mouth daily.         . clopidogrel (PLAVIX) 75 MG tablet   Oral   Take 1 tablet (75 mg total) by mouth daily.   30 tablet   5   . esomeprazole (NEXIUM) 20 MG capsule   Oral   Take 1 capsule (20 mg total) by mouth daily before breakfast.   90 capsule   3   . fish oil-omega-3 fatty acids 1000 MG capsule   Oral   Take 1 g by mouth 3 (three) times daily.           . insulin  aspart (NOVOLOG) 100 UNIT/ML injection      Inject 3-6 units before mealtimes per sliding scale. Inject 1U per carbohydrate and 2 units per 50mg /dL increment blood sugar over 150   1 pen   12   . insulin glargine (LANTUS SOLOSTAR) 100 UNIT/ML injection   Subcutaneous   Inject 38 Units into the skin at bedtime.   45 mL   3   . isosorbide mononitrate (IMDUR) 60 MG 24 hr tablet   Oral   Take 1 tablet (60 mg total) by mouth daily.   90 tablet   3   . lisinopril (PRINIVIL,ZESTRIL) 20 MG tablet   Oral   Take 1 tablet (20 mg total) by mouth 2 (two) times daily.   60 tablet   5   . rosuvastatin (CRESTOR) 10 MG tablet   Oral   Take 1 tablet (10 mg total) by mouth at bedtime.   30 tablet   6     Ok to change from Lipitor to [employer]   . Blood Glucose Monitoring Suppl (AGAMATRIX PRESTO PRO METER) DEVI   Does not apply   1 Device by Does not apply route 4 (four) times daily.   1 Device   0   . glucose blood (AGAMATRIX PRESTO TEST) test strip       Use as instructed   100 each   12     BP 178/77  Pulse 74  Temp(Src) 98.4 F (36.9 C)  Resp 14  SpO2 97%  Physical Exam  Nursing note and vitals reviewed. Constitutional: She is oriented to person, place, and time. She appears well-developed and well-nourished. No distress.  HENT:  Head: Normocephalic and atraumatic.  Right Ear: External ear normal.  Left Ear: External ear normal.  Nose: Nose normal.  Mouth/Throat: Oropharynx is clear and moist.  Eyes: Conjunctivae are normal.  Neck: Normal range of motion.  Cardiovascular: Normal rate, regular rhythm and normal heart sounds.   Pulmonary/Chest: Effort normal and breath sounds normal. No stridor. No respiratory distress. She has no wheezes. She has no rales.  Abdominal: Soft. She exhibits no distension.  Musculoskeletal: Normal range of motion.       Left knee: She exhibits swelling and erythema (localized over tibial tuberosity). Tenderness found. Patellar tendon tenderness noted. No medial joint line, no lateral joint line, no MCL and no LCL tenderness noted.  Neurological: She is alert and oriented to person, place, and time. She has normal strength.  Skin: Skin is warm and dry. She is not diaphoretic. No erythema.  Psychiatric: She has a normal mood and affect. Her behavior is normal.    ED Course  Procedures (including critical care time)  Labs Reviewed - No data to display Dg Knee Complete 4 Views Left  10/11/2012   *RADIOLOGY REPORT*  Clinical Data: Knee pain  LEFT KNEE - COMPLETE 4+ VIEW  Comparison: None.  Findings: Four views of the left knee demonstrate no acute fracture, malalignment or knee joint effusion.  There are mild degenerative changes predominately in the patellofemoral compartment.  There is some thickening of the patellar ligament. Atherosclerotic vascular calcifications are identified within the superficial femoral and popliteal arteries.  Normal bony mineralization.  No focal lytic or blastic osseous  lesion.  IMPRESSION:  1.  No acute fracture, malalignment or knee joint effusion. 2.  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 3.  Mild thickening of the patellar ligament. 4.  Superficial femoral and popliteal artery atherosclerotic vascular calcifications.   Original  Report Authenticated By: Jacqulynn Cadet, M.D.     1. Knee pain, left       MDM  Patient is a 67 year old female who presents with left knee pain. XR shows no acute fracture, malalignment or knee joint effusion. Degenerative changes. Patient is given orthopedic referral. She has a walker at home which she will use to help her walk. NO concern for septic joint. Continue to use ice and NSAIDs. Pain med rx given. Return insturctions given. Vital signs stable for discharge.         Elwyn Lade, PA-C 10/12/12 [phone]

## 2012-10-14 ENCOUNTER — Encounter: Payer: Self-pay | Admitting: Internal Medicine

## 2012-10-14 NOTE — ED Provider Notes (Signed)
Medical screening examination/treatment/procedure(s) were performed by non-physician practitioner and as supervising physician I was immediately available for consultation/collaboration.   Mylinda Latina III, MD 10/14/12 [phone]

## 2012-10-22 ENCOUNTER — Encounter: Payer: Self-pay | Admitting: Internal Medicine

## 2012-10-22 ENCOUNTER — Ambulatory Visit (INDEPENDENT_AMBULATORY_CARE_PROVIDER_SITE_OTHER): Payer: Medicare Other | Admitting: Internal Medicine

## 2012-10-22 ENCOUNTER — Encounter: Payer: Self-pay | Admitting: Dietician

## 2012-10-22 ENCOUNTER — Telehealth: Payer: Self-pay | Admitting: Dietician

## 2012-10-22 VITALS — BP 143/76 | HR 71 | Temp 98.2°F | Ht 65.5 in | Wt 160.1 lb

## 2012-10-22 DIAGNOSIS — E785 Hyperlipidemia, unspecified: Secondary | ICD-10-CM

## 2012-10-22 DIAGNOSIS — F17200 Nicotine dependence, unspecified, uncomplicated: Secondary | ICD-10-CM

## 2012-10-22 DIAGNOSIS — E1159 Type 2 diabetes mellitus with other circulatory complications: Secondary | ICD-10-CM

## 2012-10-22 DIAGNOSIS — H81399 Other peripheral vertigo, unspecified ear: Secondary | ICD-10-CM

## 2012-10-22 DIAGNOSIS — I10 Essential (primary) hypertension: Secondary | ICD-10-CM

## 2012-10-22 DIAGNOSIS — H81393 Other peripheral vertigo, bilateral: Secondary | ICD-10-CM

## 2012-10-22 DIAGNOSIS — E119 Type 2 diabetes mellitus without complications: Secondary | ICD-10-CM

## 2012-10-22 LAB — LIPID PANEL
Cholesterol: 142 mg/dL (ref 0–200)
HDL: 44 mg/dL (ref >39)
LDL Cholesterol: 73 mg/dL (ref 0–99)
Total CHOL/HDL Ratio: 3.2 Ratio
Triglycerides: 126 mg/dL (ref <150)
VLDL: 25 mg/dL (ref 0–40)

## 2012-10-22 LAB — COMPLETE METABOLIC PANEL WITH GFR
ALT: 11 U/L (ref 0–35)
Albumin: 4 g/dL (ref 3.5–5.2)
Alkaline Phosphatase: 115 U/L (ref 39–117)
BUN: 28 mg/dL — ABNORMAL HIGH (ref 6–23)
Calcium: 10.5 mg/dL (ref 8.4–10.5)
Chloride: 109 mEq/L (ref 96–112)
GFR, Est Non African American: 49 mL/min — ABNORMAL LOW
Glucose, Bld: 139 mg/dL — ABNORMAL HIGH (ref 70–99)
Potassium: 4.6 mEq/L (ref 3.5–5.3)
Sodium: 141 mEq/L (ref 135–145)
Total Bilirubin: 0.3 mg/dL (ref 0.3–1.2)
Total Protein: 6.8 g/dL (ref 6.0–8.3)

## 2012-10-22 LAB — GLUCOSE, CAPILLARY: Glucose-Capillary: 134 mg/dL — ABNORMAL HIGH (ref 70–99)

## 2012-10-22 LAB — POCT GLYCOSYLATED HEMOGLOBIN (HGB A1C): Hemoglobin A1C: 8.4

## 2012-10-22 MED ORDER — NICOTINE POLACRILEX 4 MG MT GUM: 4.0000 mg | CHEWING_GUM | OROMUCOSAL | Status: DC | PRN

## 2012-10-22 MED ORDER — INSULIN GLARGINE 100 UNIT/ML ~~LOC~~ SOLN: 40.0000 [IU] | Freq: Every day | SUBCUTANEOUS | Status: DC

## 2012-10-22 NOTE — Patient Instructions (Signed)
1. Increase lantus to 40U at night. Check your blood sugars before and after meals. Come back in one week to see [facility]. We will touch base about how to adjust your sliding scale. 2. We will be setting up your bone scan and referral for vestibular rehab.      Treatment Goals:  Goals (1 Years of Data) as of 10/22/12         As of Today 10/11/12 09/21/12 07/09/12 04/13/12     Blood Pressure    . Blood Pressure < 140/80  143/76 178/77 145/72 146/69 134/71     Result Component    . HEMOGLOBIN A1C < 7.0  8.4   8.6     . LDL CALC < 70            Progress Toward Treatment Goals:  Treatment Goal 10/22/2012  Hemoglobin A1C unchanged  Blood pressure at goal  Stop smoking smoking the same amount    Self Care Goals & Plans:  Self Care Goal 09/21/2012  Manage my medications take my medicines as prescribed; bring my medications to every visit; refill my medications on time  Monitor my health -  Eat healthy foods drink diet soda or water instead of juice or soda; eat more vegetables; eat foods that are low in salt  Be physically active -  Stop smoking go to the [employer] website (https://scott-booker.info/)    Home Blood Glucose Monitoring 10/22/2012  Check my blood sugar 6 times a day  When to check my blood sugar before breakfast; before meals; after breakfast; after lunch; after dinner     Care Management & Community Referrals:  Referral 10/22/2012  Referrals made for care management support diabetes educator

## 2012-10-22 NOTE — Assessment & Plan Note (Addendum)
BP Readings from Last 3 Encounters:  10/22/12 143/76  10/11/12 178/77  09/21/12 145/72    Lab Results  Component Value Date   NA 137 07/09/2012   K 4.1 07/09/2012   CREATININE 1.06 07/09/2012    Assessment: Blood pressure control:   Progress toward BP goal:  at goal Comments: carvedilol 25 mg twice a day, chlorthalidone 25 mg daily, isosorbide mononitrate 60 mg daily and lisinopril 40 mg daily  Plan: Medications:  continue current medications

## 2012-10-22 NOTE — Assessment & Plan Note (Signed)
Has been on Crestor for a couple weeks. Will check LFTs and lipid panel today.

## 2012-10-22 NOTE — Telephone Encounter (Signed)
Last visit to this practice was 02-02-2012, she missed a follow up in 04/2012. Visit note being faxed to [place].

## 2012-10-22 NOTE — Assessment & Plan Note (Addendum)
Lab Results  Component Value Date   HGBA1C 8.4 10/22/2012   HGBA1C 8.6 07/09/2012   HGBA1C 8.0* 02/07/2012     Assessment: Diabetes control: fair control Progress toward A1C goal:  unchanged Comments: Prelunch hyperglycemia as described in history of present illness  Plan: Medications:  I would like her to increase her Lantus from 38-40 units. I instructed her to check her pre-meal and postmeal blood sugars 3 times a day for the next week, and to continue using her NovoLog correction scale of 1 unit per carbohydrate and 2 units per blood sugar increment of 50 over 150. I would like her to followup with Debera Lat in one week to review her data. We can then better collaborate on how to adjust her mealtime insulin with more data Home glucose monitoring: Frequency: 6 times a day Timing: before breakfast;before meals;after breakfast;after lunch;after dinner Instruction/counseling given: reminded to get eye exam and reminded to bring blood glucose meter & log to each visit   Gave her number to call to make her eye appointment. She has ophthalmologist, [facility].

## 2012-10-22 NOTE — Assessment & Plan Note (Signed)
  Assessment: Progress toward smoking cessation:  smoking the same amount Barriers to progress toward smoking cessation:  withdrawal symptoms  Plan: Instruction/counseling given:  I counseled patient on the dangers of tobacco use, advised patient to stop smoking, and reviewed strategies to maximize success. Educational resources provided:    Self management tools provided:    Medications to assist with smoking cessation:  Nicotine Gum Patient agreed to the following self-care plans for smoking cessation:    Other plans:

## 2012-10-22 NOTE — Progress Notes (Signed)
Patient ID: Stephanie Gross, female   DOB: 1946-04-24, 67 y.o.   MRN: [id number]  Subjective:   Patient ID: Stephanie Gross female   DOB: [birth year] 30 y.o.   MRN: [id number]  HPI: [name] is a 67 y.o. female with past medical history of diabetes, CAD s/p PCA, CVA, and hypertension who presents for followup of her hypertension and DM.  In regards to her diabetes, patient is currently supposed to be taking 38 units of Lantus at bedtime and 3-6 units of mealtime NovoLog. Previously, she had not been taking these meds as prescribed because of issues filling insulins at [place] [facility]; however now she reports access to medications and taking them as prescribed. She checks her blood sugar 3 times a day before meals. She reports that her prebreakfast and predinner blood sugars are usually in the mid-100s, but her pre-lunch blood sugar can be in the 200-300s. She takes 1 unit of NovoLog per carbohydrate to be in and 2 units per every 50 g/dL increment in blood sugar over 150 pre-meal. She does not check her postmeal blood sugars. She did not bring in meter today. Denies any hypoglycemic events or symptoms. She does report today return of some vertigo symptoms that she experienced in the past. She has dizziness and nausea provoked by rotational movements of the head which subside if she rests her keep her head still. In the past, I have instructed on Epley maneuvers and those have helped.; However now this is happening more frequently sometimes when she turns her head to look behind her when driving in her vision starts to spin. This is distressing for her. At last visit, she had not filled her cholesterol medicine (crestor) due to issues with the [facility] program. Today she reports starting crestor 10mg  without any side effects.  In terms of her blood pressure, her current hypertensive therapy includes carvedilol 25 mg twice a day, chlorthalidone 25 mg daily, isosorbide mononitrate 60 mg  daily and lisinopril 40 mg daily.  She denies  any signs or mptoms of recurrent stroke or TIA. She denies headaches, chest pain, SOB, palpitations. She continues on aspirin 81 mg and Plavix 75 mg in setting of stroke and CAD.  I have counseled her in the past about smoking cessation. At last visit, I prescribed nicotine patch. She was unable to fill this because the pharmacy did not provided. She is pulling to try the Nicorette gum today.     Past Medical History  Diagnosis Date  . CAD (coronary artery disease)     s/p RCA stent 2007. Myoview 2010 normal  . DM (diabetes mellitus)   . HTN (hypertension)   . Hyperlipidemia   . GERD (gastroesophageal reflux disease)   . 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 aspirin EC 81 MG tablet Take 81 mg by mouth daily.      . Blood Glucose Monitoring Suppl (AGAMATRIX PRESTO PRO METER) DEVI 1 Device by Does not apply route 4 (four) times daily.  1 Device  0  . carvedilol (COREG) 25 MG tablet Take 25 mg by mouth 2 (two) times daily with a meal.      . chlorthalidone (HYGROTON) 25 MG tablet Take 25 mg by mouth daily.      . clopidogrel (PLAVIX) 75 MG tablet Take 1 tablet (75 mg total) by mouth daily.  30 tablet  5  .  esomeprazole (NEXIUM) 20 MG capsule Take 1 capsule (20 mg total) by mouth daily before breakfast.  90 capsule  3  . fish oil-omega-3 fatty acids 1000 MG capsule Take 1 g by mouth 3 (three) times daily.        Marland Kitchen glucose blood (AGAMATRIX PRESTO TEST) test strip Use as instructed  100 each  12  . HYDROcodone-acetaminophen (NORCO/VICODIN) 5-325 MG per tablet Take 1 tablet by mouth every 6 (six) hours as needed for pain.  12 tablet  0  . insulin aspart (NOVOLOG) 100 UNIT/ML injection Inject 3-6 units before mealtimes per sliding scale. Inject 1U per carbohydrate and 2 units per 50mg /dL increment blood sugar over 150  1 pen  12  .  insulin glargine (LANTUS) 100 UNIT/ML injection Inject 0.4 mLs (40 Units total) into the skin at bedtime.  45 mL  3  . isosorbide mononitrate (IMDUR) 60 MG 24 hr tablet Take 1 tablet (60 mg total) by mouth daily.  90 tablet  3  . lisinopril (PRINIVIL,ZESTRIL) 20 MG tablet Take 1 tablet (20 mg total) by mouth 2 (two) times daily.  60 tablet  5  . nicotine polacrilex (NICORETTE) 4 MG gum Take 1 each (4 mg total) by mouth as needed for smoking cessation.  110 tablet  2  . promethazine (PHENERGAN) 25 MG tablet Take 1 tablet (25 mg total) by mouth every 6 (six) hours as needed for nausea.  12 tablet  0  . rosuvastatin (CRESTOR) 10 MG tablet Take 1 tablet (10 mg total) by mouth at bedtime.  30 tablet  6   No current facility-administered medications for this visit.   No family history on file. History   Social History  . Marital Status: Single    Spouse Name: N/A    Number of Children: N/A  . Years of Education: N/A   Occupational History  . unemployed    Social History Main Topics  . Smoking status: Current Every Day Smoker -- 0.30 packs/day for 30 years    Types: Cigarettes  . Smokeless tobacco: Never Used     Comment: cutting back  . Alcohol Use: No  . Drug Use: No  . Sexually Active: None   Other Topics Concern  . None   Social History Narrative   Patient recently lost her job and is currently without insurance. - Nov 2012   Review of Systems: 10 pt ROS performed, pertinent positives and negatives noted in HPI Objective:  Physical Exam: Filed Vitals:   10/22/12 1317  BP: 143/76  Pulse: 71  Temp: 98.2 F (36.8 C)  TempSrc: Oral  Height: 5' 5.5" (1.664 m)  Weight: 160 lb 1.6 oz (72.621 kg)  SpO2: 98%   General: sitting in chair, NAD  HEENT: PERRL, EOMI, no scleral icterus. MMM of OP.  Cardiac: RRR, no rubs, murmurs or gallops  Pulm: clear to auscultation bilaterally, no wheezes, rales, or rhonchi  Abd: soft, nontender, nondistended, BS present  Ext: warm and well  perfused, no pedal edema  Neuro: alert and oriented X3, cranial nerves II-XII grossly intact, strength and sensation to light touch equal in bilateral upper and lower extremities.  No nystagmus on Dix-Hallpike, although maneuver did reproduce dizziness and nausea which subsided with sitting still.  Assessment & Plan:   Please see problem-based charting for assessment and plan.

## 2012-10-22 NOTE — Progress Notes (Signed)
Case discussed with Dr. Ziemer soon after the resident saw the patient. We reviewed the resident's history and exam and pertinent patient test results. I agree with the assessment, diagnosis, and plan of care documented in the resident's note. 

## 2012-10-22 NOTE — Assessment & Plan Note (Signed)
Patient presenting with symptoms consistent with BPPV. Transient dizziness and nausea with turning of her head. No visual changes or unsteady gait or ataxia. Neuro exam is normal without any gait abnormalities or cerebellar deficits. No hearing loss. In the past, Epley's maneuver has helped; however these episodes are happening more frequently. - Referral for vestibular rehabilitation - If symptoms do not improve with vestibular rehabilitation, consider further workup in light of history of multiple CVAs, although symptoms not currently consistent with central etiology

## 2012-10-25 ENCOUNTER — Ambulatory Visit: Payer: Self-pay | Admitting: Internal Medicine

## 2012-10-28 ENCOUNTER — Other Ambulatory Visit: Payer: Self-pay | Admitting: Dietician

## 2012-10-28 ENCOUNTER — Encounter: Payer: Self-pay | Admitting: Dietician

## 2012-10-28 ENCOUNTER — Ambulatory Visit (INDEPENDENT_AMBULATORY_CARE_PROVIDER_SITE_OTHER): Payer: Medicare Other | Admitting: Dietician

## 2012-10-28 VITALS — Wt 161.4 lb

## 2012-10-28 DIAGNOSIS — E119 Type 2 diabetes mellitus without complications: Secondary | ICD-10-CM

## 2012-10-28 DIAGNOSIS — I70219 Atherosclerosis of native arteries of extremities with intermittent claudication, unspecified extremity: Secondary | ICD-10-CM

## 2012-10-28 DIAGNOSIS — E1159 Type 2 diabetes mellitus with other circulatory complications: Secondary | ICD-10-CM

## 2012-10-28 MED ORDER — GLUCOSE BLOOD VI STRP: ORAL_STRIP | Status: DC

## 2012-10-28 MED ORDER — INSULIN GLARGINE 100 UNIT/ML SOLOSTAR PEN: 40.0000 [IU] | PEN_INJECTOR | Freq: Every day | SUBCUTANEOUS | Status: DC

## 2012-10-28 MED ORDER — INSULIN ASPART 100 UNIT/ML FLEXPEN: PEN_INJECTOR | SUBCUTANEOUS | Status: DC

## 2012-10-28 NOTE — Telephone Encounter (Signed)
Addendum: patient request one touch ultra strip Rx be sent to [facility] on [address] and new Rx for lantus and novolog to [facility]

## 2012-10-28 NOTE — Addendum Note (Signed)
Addended by: Larey Dresser A on: 10/28/2012 05:15 PM   Modules accepted: Orders

## 2012-10-28 NOTE — Progress Notes (Signed)
Initial visit:  MNT1  Medical Nutrition Therapy:  Appt start time: 1400 end time:  1430.  MEDICATIONS 40 units lantus at bedtime x 1 week, 3 units Novolog three times daily with meals  Assessment:  Primary concerns today: Blood sugar control. Wants a1C lower( current is 8.4%)  Patient here to clarify how to correct her high blood sugars per Dr. Thora Lance instructions.  Eats light healthy meals, little fruit and no milk Usual eating pattern includes 2-3 meals and 1-2 snacks per day. She verbalized competence in  insulin adminstration and use and self monitoring.  Usual physical activity includes activities of daily living.  Blood sugar meter downloaded. Average is 234, fasting range is 148-239 x1 week.  calculated TDD 37-58 units/day, current TDD is 49 units/day  Progress Towards Goal(s):  In progress.   Nutritional Diagnosis:  [place]-2.2 Altered nutrition-related laboratory As related to high blood sugars.  As evidenced by higher than desired a1C.    Intervention:  Nutrition education about how to correct blood sugar based on pre meal reading and increasing or decreasing mealtime insulin. .  Monitoring/Evaluation:  Dietary intake, exercise, blood sugars, and body weight in 3 week(s).     Addendum: patient request one touch ultra strip Rx be sent to [facility] on [address] and new Rx for lantus and novolog to [facility]

## 2012-10-28 NOTE — Patient Instructions (Addendum)
Add 1 unit of Novolog insulin to your mealtime dose (3 units) when blood sugar is more than 140 before a meal.   You can also correct when blood sugar is low If blood sugar before meal is: 60-99 mealtime dose is 2 units 100-140 mealtime dose is 3 units 141-180 mealtime dose is 4 units 181-220 mealtime dose is 5 units 221-260 mealtime dose is 6 units 261-300 mealtime dose is 7 units 301-340 mealtime dose is 8 units  Please continue to check your blood sugar as per dr. Thora Lance instructions and please check mid sleep about 1 time a month or anytime your Lantus dose is changed.   Please bring meter by to be downloaded in the next 2-3 weeks to monitor your progress

## 2012-11-01 ENCOUNTER — Ambulatory Visit: Payer: Medicare Other | Attending: Internal Medicine | Admitting: Rehabilitative and Restorative Service Providers"

## 2012-11-01 DIAGNOSIS — R42 Dizziness and giddiness: Secondary | ICD-10-CM | POA: Insufficient documentation

## 2012-11-01 DIAGNOSIS — IMO0001 Reserved for inherently not codable concepts without codable children: Secondary | ICD-10-CM | POA: Insufficient documentation

## 2012-11-01 NOTE — Telephone Encounter (Signed)
Called to pharm 

## 2012-11-04 ENCOUNTER — Other Ambulatory Visit: Payer: Self-pay

## 2012-11-09 ENCOUNTER — Ambulatory Visit: Payer: Medicare Other | Admitting: Rehabilitative and Restorative Service Providers"

## 2012-11-11 ENCOUNTER — Encounter: Payer: Medicare Other | Admitting: Dietician

## 2012-12-01 ENCOUNTER — Other Ambulatory Visit: Payer: Self-pay

## 2012-12-22 ENCOUNTER — Other Ambulatory Visit: Payer: Self-pay | Admitting: *Deleted

## 2012-12-22 ENCOUNTER — Ambulatory Visit
Admission: RE | Admit: 2012-12-22 | Discharge: 2012-12-22 | Disposition: A | Discharge status: Home / Self Care | Payer: Medicare Other | Source: Ambulatory Visit | Attending: *Deleted | Admitting: *Deleted

## 2012-12-22 DIAGNOSIS — A150 Tuberculosis of lung: Secondary | ICD-10-CM

## 2012-12-31 ENCOUNTER — Encounter: Payer: Self-pay | Admitting: Internal Medicine

## 2013-01-14 ENCOUNTER — Ambulatory Visit (INDEPENDENT_AMBULATORY_CARE_PROVIDER_SITE_OTHER): Payer: Medicare Other | Admitting: Internal Medicine

## 2013-01-14 ENCOUNTER — Ambulatory Visit: Payer: Medicare Other

## 2013-01-14 ENCOUNTER — Encounter: Payer: Self-pay | Admitting: Internal Medicine

## 2013-01-14 VITALS — BP 134/70 | HR 72 | Temp 97.9°F | Ht 66.0 in | Wt 159.9 lb

## 2013-01-14 DIAGNOSIS — E1159 Type 2 diabetes mellitus with other circulatory complications: Secondary | ICD-10-CM

## 2013-01-14 LAB — GLUCOSE, CAPILLARY: Glucose-Capillary: 140 mg/dL — ABNORMAL HIGH (ref 70–99)

## 2013-01-14 LAB — POCT GLYCOSYLATED HEMOGLOBIN (HGB A1C): Hemoglobin A1C: 7.1

## 2013-01-14 MED ORDER — GABAPENTIN 100 MG PO CAPS: 100.0000 mg | ORAL_CAPSULE | Freq: Three times a day (TID) | ORAL | Status: DC

## 2013-01-14 NOTE — Patient Instructions (Addendum)
Goals (1 Years of Data) as of 01/14/13         As of Today 10/22/12 10/11/12 09/21/12 07/09/12     Blood Pressure    . Blood Pressure < 140/80  134/70 143/76 178/77 145/72 146/69     Result Component    . HEMOGLOBIN A1C < 7.0  7.1 8.4   8.6    . LDL CALC < 70   73

## 2013-01-14 NOTE — Progress Notes (Signed)
Subjective:   Patient ID: Stephanie Gross female   DOB: 22-Dec-1945 67 y.o.   MRN: [id number]  HPI: Stephanie Gross is a 67 y.o. AA woman pmh of DM, HTN, HLD, GERD coming in to "meet her new dr" and some tingling in her feet.   Pt reports receiving a letter from the [facility] that she has a new dr and wanted to establish relationship with new provider.   Tingling sensation pt states that she has noticed some sharp "pin like sensation" in her feet bilaterally sometimes when walking or when she goes to sleep. She states that at one point she was on gabapentin when her DM was more out of control and then d/c. She denies any shooting pain from her hips or legs, no back pain, no trauma/injury, weakness, or bladder/urine incontinence.     Past Medical History  Diagnosis Date  . CAD (coronary artery disease)     s/p RCA stent 2007. Myoview 2010 normal  . DM (diabetes mellitus)   . HTN (hypertension)   . Hyperlipidemia   . GERD (gastroesophageal reflux disease)   . 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 aspirin EC 81 MG tablet Take 81 mg by mouth daily.      . Blood Glucose Monitoring Suppl (AGAMATRIX PRESTO PRO METER) DEVI 1 Device by Does not apply route 4 (four) times daily.  1 Device  0  . carvedilol (COREG) 25 MG tablet Take 25 mg by mouth 2 (two) times daily with a meal.      . chlorthalidone (HYGROTON) 25 MG tablet Take 25 mg by mouth daily.      . clopidogrel (PLAVIX) 75 MG tablet Take 1 tablet (75 mg total) by mouth daily.  30 tablet  5  . esomeprazole (NEXIUM) 20 MG capsule Take 1 capsule (20 mg total) by mouth daily before breakfast.  90 capsule  3  . fish oil-omega-3 fatty acids 1000 MG capsule Take 1 g by mouth 3 (three) times daily.        Marland Kitchen glucose blood (AGAMATRIX PRESTO TEST) test strip Use as instructed  100 each  12  . glucose blood (ONE TOUCH ULTRA  TEST) test strip Check blood sugar before and after meals and bedtime 250.00 insulin requiring  150 each  12  . HYDROcodone-acetaminophen (NORCO/VICODIN) 5-325 MG per tablet Take 1 tablet by mouth every 6 (six) hours as needed for pain.  12 tablet  0  . insulin aspart (NOVOLOG FLEXPEN) 100 UNIT/ML SOPN FlexPen Inject 1U per carbohydrate and 1 units per 40mg /dL increment blood sugar over 140, 60-99 inject 2 units, 100-140 inject 3 units, 141-180 inject 4 units, 181-220 inject 5 units, 221-260 inject 6 units, 261-300 inject 7 units, 301-340 inject 8 units  15 mL  2  . Insulin Glargine (LANTUS SOLOSTAR) 100 UNIT/ML SOPN Inject 40 Units into the skin at bedtime.  15 mL  2  . isosorbide mononitrate (IMDUR) 60 MG 24 hr tablet Take 1 tablet (60 mg total) by mouth daily.  90 tablet  3  . lisinopril (PRINIVIL,ZESTRIL) 20 MG tablet Take 1 tablet (20 mg total) by mouth 2 (two) times daily.  60 tablet  5  . nicotine polacrilex (NICORETTE) 4 MG gum Take 1 each (4 mg total) by mouth as needed for smoking cessation.  110 tablet  2  . promethazine (PHENERGAN) 25 MG tablet Take  1 tablet (25 mg total) by mouth every 6 (six) hours as needed for nausea.  12 tablet  0  . rosuvastatin (CRESTOR) 10 MG tablet Take 1 tablet (10 mg total) by mouth at bedtime.  30 tablet  6   No current facility-administered medications for this visit.   No family history on file. History   Social History  . Marital Status: Single    Spouse Name: N/A    Number of Children: N/A  . Years of Education: N/A   Occupational History  . unemployed    Social History Main Topics  . Smoking status: Current Every Day Smoker -- 0.30 packs/day for 30 years    Types: Cigarettes  . Smokeless tobacco: Never Used     Comment: cutting back  . Alcohol Use: No  . Drug Use: No  . Sexual Activity: None   Other Topics Concern  . None   Social History Narrative   Patient recently lost her job and is currently without insurance. - Nov 2012    Review of Systems: otherwise negative unless listed in HPI  Objective:  Physical Exam: Filed Vitals:   01/14/13 1539  BP: 134/70  Pulse: 72  Temp: 97.9 F (36.6 C)  TempSrc: Oral  Height: 5\' 6"  (1.676 m)  Weight: 159 lb 14.4 oz (72.53 kg)  SpO2: 98%   General: sitting in chair, NAD HEENT: PERRL, EOMI, no scleral icterus Cardiac: RRR, no rubs, murmurs or gallops Pulm: clear to auscultation bilaterally, moving normal volumes of air Abd: soft, nontender, nondistended, BS present Ext: warm and well perfused, no pedal edema Neuro: alert and oriented X3, cranial nerves II-XII grossly intact, negative leg raise, no sock like anesthesia but some decreased sensation on plantar surface of feet   Assessment & Plan:  1. DM: pt has achieved improving control with HgbA1c today of 7.1. Pt describes some tingling sensation in her feet bilaterally with typical peripheral distribution indicating most likely DM related.  -gabapentin 100mg  TID -would repeat Urine microalbumin at next visit  2. Health maintenance: pt refused dexa scan at this time and would discuss at future visit. Pt received flu vaccine last week at [facility].   Pt was discussed with Dr. Stann Mainland

## 2013-02-24 ENCOUNTER — Other Ambulatory Visit: Payer: Self-pay | Admitting: Internal Medicine

## 2013-02-24 DIAGNOSIS — I10 Essential (primary) hypertension: Secondary | ICD-10-CM

## 2013-02-25 ENCOUNTER — Other Ambulatory Visit: Payer: Self-pay | Admitting: *Deleted

## 2013-02-25 DIAGNOSIS — I10 Essential (primary) hypertension: Secondary | ICD-10-CM

## 2013-02-25 MED ORDER — ISOSORBIDE MONONITRATE ER 60 MG PO TB24: 60.0000 mg | ORAL_TABLET | Freq: Every day | ORAL | Status: DC

## 2013-03-03 ENCOUNTER — Other Ambulatory Visit: Payer: Self-pay

## 2013-03-07 ENCOUNTER — Other Ambulatory Visit: Payer: Self-pay | Admitting: *Deleted

## 2013-03-08 MED ORDER — CLOPIDOGREL BISULFATE 75 MG PO TABS: 75.0000 mg | ORAL_TABLET | Freq: Every day | ORAL | Status: DC

## 2013-04-01 ENCOUNTER — Other Ambulatory Visit: Payer: Self-pay | Admitting: *Deleted

## 2013-04-01 ENCOUNTER — Encounter: Payer: Medicare Other | Admitting: Internal Medicine

## 2013-04-01 MED ORDER — PANTOPRAZOLE SODIUM 20 MG PO TBEC: 20.0000 mg | DELAYED_RELEASE_TABLET | Freq: Every day | ORAL | Status: DC

## 2013-04-01 NOTE — Telephone Encounter (Signed)
This preferred by insurance, it was originally nexium

## 2013-04-04 ENCOUNTER — Telehealth: Payer: Self-pay | Admitting: Licensed Clinical Social Worker

## 2013-04-04 DIAGNOSIS — Z0000 Encounter for general adult medical examination without abnormal findings: Secondary | ICD-10-CM

## 2013-04-04 NOTE — Telephone Encounter (Signed)
I called patient to remind her to schedule her mammogram appointment. I will put an order in Epic.

## 2013-04-07 ENCOUNTER — Other Ambulatory Visit: Payer: Self-pay | Admitting: *Deleted

## 2013-04-07 MED ORDER — CHLORTHALIDONE 25 MG PO TABS: 25.0000 mg | ORAL_TABLET | Freq: Every day | ORAL | Status: DC

## 2013-04-29 ENCOUNTER — Telehealth: Payer: Self-pay | Admitting: Dietician

## 2013-04-29 NOTE — Telephone Encounter (Signed)
Unable to reach patient by phone 

## 2013-05-27 ENCOUNTER — Other Ambulatory Visit: Payer: Self-pay | Admitting: *Deleted

## 2013-05-27 NOTE — Telephone Encounter (Signed)
Fax from [facility] - pt wants to change from Protonix to Nexium 20mg  cap;Protonix is no longer free from [facility].

## 2013-05-30 MED ORDER — ROSUVASTATIN CALCIUM 10 MG PO TABS: 10.0000 mg | ORAL_TABLET | Freq: Every day | ORAL | Status: DC

## 2013-05-30 MED ORDER — ESOMEPRAZOLE MAGNESIUM 20 MG PO CPDR: 20.0000 mg | DELAYED_RELEASE_CAPSULE | Freq: Every day | ORAL | Status: DC

## 2013-06-02 NOTE — Telephone Encounter (Signed)
Pt called about Nexium and Crestor rxs. Nexium and Crestor rxs called to [facility]; returned pt's call.

## 2013-08-04 ENCOUNTER — Other Ambulatory Visit: Payer: Self-pay

## 2013-08-11 ENCOUNTER — Emergency Department (INDEPENDENT_AMBULATORY_CARE_PROVIDER_SITE_OTHER): Payer: Medicare Other

## 2013-08-11 ENCOUNTER — Emergency Department (HOSPITAL_COMMUNITY)
Admission: EM | Admit: 2013-08-11 | Discharge: 2013-08-11 | Disposition: A | Discharge status: Home / Self Care | Payer: Medicare Other | Source: Home / Self Care | Attending: Family Medicine | Admitting: Family Medicine

## 2013-08-11 ENCOUNTER — Encounter (HOSPITAL_COMMUNITY): Payer: Self-pay | Admitting: Emergency Medicine

## 2013-08-11 DIAGNOSIS — J40 Bronchitis, not specified as acute or chronic: Secondary | ICD-10-CM

## 2013-08-11 MED ORDER — ALBUTEROL SULFATE HFA 108 (90 BASE) MCG/ACT IN AERS: 2.0000 | INHALATION_SPRAY | RESPIRATORY_TRACT | Status: DC | PRN

## 2013-08-11 MED ORDER — ALBUTEROL SULFATE (2.5 MG/3ML) 0.083% IN NEBU
2.5000 mg | INHALATION_SOLUTION | Freq: Once | RESPIRATORY_TRACT | Status: AC
  Administered 2013-08-11: 2.5 mg via RESPIRATORY_TRACT

## 2013-08-11 MED ORDER — ALBUTEROL SULFATE (2.5 MG/3ML) 0.083% IN NEBU
INHALATION_SOLUTION | RESPIRATORY_TRACT | Status: AC
  Filled 2013-08-11: qty 3

## 2013-08-11 NOTE — Discharge Instructions (Signed)

## 2013-08-11 NOTE — ED Provider Notes (Signed)
CSN: [id number]     Arrival date & time 08/11/13  0935 History   First MD Initiated Contact with Patient 08/11/13 1238     Chief Complaint  Patient presents with  . Chest Pain   (Consider location/radiation/quality/duration/timing/severity/associated sxs/prior Treatment) HPI Comments: Pt with URI 2-3 weeks ago, cough is lingering. Using delsym for cough that helps a little. 3 days ago began having pain in L lower side/ribs/chest. Is not abd pain, is definitely in rib area. Does not feel like previous cardiac related pain. Feels achy like she "pulled something". Pain is not constant but occurs if pt coughs or sneeze or if you push on it.   Patient is a 68 y.o. female presenting with chest pain. The history is provided by the patient.  Chest Pain Pain location:  L chest Pain quality: aching   Pain radiates to:  Does not radiate Pain radiates to the back: yes   Pain severity:  Moderate Onset quality:  Gradual Duration:  3 days Timing:  Intermittent Progression:  Unchanged Chronicity:  New Context comment:  Coughing Relieved by:  Nothing Worsened by:  Coughing and deep breathing Ineffective treatments: aleve gives mild temp relief. Associated symptoms: cough   Associated symptoms: no diaphoresis, no dizziness, no fever, no nausea, no numbness, no palpitations and no shortness of breath   Risk factors: coronary artery disease, diabetes mellitus, high cholesterol and hypertension     Past Medical History  Diagnosis Date  . CAD (coronary artery disease)     s/p RCA stent 2007. Myoview 2010 normal  . DM (diabetes mellitus)   . HTN (hypertension)   . Hyperlipidemia   . GERD (gastroesophageal reflux disease)   . 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 Coronary angioplasty with stent placement    . Abdominal hysterectomy    . Shoulder surgery    . Knee surgery     History reviewed.  No pertinent family history. History  Substance Use Topics  . Smoking status: Current Every Day Smoker -- 0.30 packs/day for 30 years    Types: Cigarettes  . Smokeless tobacco: Never Used     Comment: cutting back  . Alcohol Use: No   OB History   Grav Para Term Preterm Abortions TAB SAB Ect Mult Living                 Review of Systems  Constitutional: Negative for fever and diaphoresis.  HENT: Negative for congestion, postnasal drip, rhinorrhea and sinus pressure.   Respiratory: Positive for cough. Negative for shortness of breath and wheezing.   Cardiovascular: Positive for chest pain. Negative for palpitations.  Gastrointestinal: Negative for nausea.  Neurological: Negative for dizziness and numbness.    Allergies  Chantix and Codeine  Home Medications   Prior to Admission medications   Medication Sig Start Date End Date Taking? Authorizing Provider  clopidogrel (PLAVIX) 75 MG tablet Take 1 tablet (75 mg total) by mouth daily. 03/07/13  Yes Clinton Gallant, MD  esomeprazole (NEXIUM) 20 MG capsule Take 1 capsule (20 mg total) by mouth daily at 12 noon. 05/27/13  Yes Clinton Gallant, MD  insulin aspart (NOVOLOG FLEXPEN) 100 UNIT/ML SOPN FlexPen Inject 1U per carbohydrate and 1 units per 40mg /dL increment blood sugar over 140, 60-99 inject 2 units, 100-140 inject 3 units, 141-180 inject 4 units, 181-220 inject 5 units, 221-260 inject 6 units, 261-300 inject 7 units,  301-340 inject 8 units 10/28/12  Yes Bartholomew Crews, MD  Insulin Glargine (LANTUS SOLOSTAR) 100 UNIT/ML SOPN Inject 40 Units into the skin at bedtime. 10/28/12  Yes Bartholomew Crews, MD  albuterol (PROAIR HFA) 108 (90 BASE) MCG/ACT inhaler Inhale 2 puffs into the lungs every 4 (four) hours as needed for wheezing or shortness of breath. 08/11/13   Carvel Getting, NP  aspirin EC 81 MG tablet Take 81 mg by mouth daily.    Historical Provider, MD  Blood Glucose Monitoring Suppl (AGAMATRIX PRESTO PRO METER) DEVI 1 Device by Does  not apply route 4 (four) times daily. 07/09/12   Tonia Brooms, MD  carvedilol (COREG) 25 MG tablet Take 25 mg by mouth 2 (two) times daily with a meal.    Historical Provider, MD  chlorthalidone (HYGROTON) 25 MG tablet Take 1 tablet (25 mg total) by mouth daily. 04/07/13   Clinton Gallant, MD  fish oil-omega-3 fatty acids 1000 MG capsule Take 1 g by mouth 3 (three) times daily.      Historical Provider, MD  gabapentin (NEURONTIN) 100 MG capsule Take 1 capsule (100 mg total) by mouth 3 (three) times daily. 01/14/13 01/14/14  Clinton Gallant, MD  glucose blood (AGAMATRIX PRESTO TEST) test strip Use as instructed 07/09/12   Tonia Brooms, MD  glucose blood (ONE TOUCH ULTRA TEST) test strip Check blood sugar before and after meals and bedtime 250.00 insulin requiring 10/28/12   Bartholomew Crews, MD  HYDROcodone-acetaminophen (NORCO/VICODIN) 5-325 MG per tablet Take 1 tablet by mouth every 6 (six) hours as needed for pain. 10/11/12   Elwyn Lade, PA-C  isosorbide mononitrate (IMDUR) 60 MG 24 hr tablet Take 1 tablet (60 mg total) by mouth daily. 02/25/13   Clinton Gallant, MD  lisinopril (PRINIVIL,ZESTRIL) 20 MG tablet Take 1 tablet (20 mg total) by mouth 2 (two) times daily. 02/24/13   Karren Cobble, MD  nicotine polacrilex (NICORETTE) 4 MG gum Take 1 each (4 mg total) by mouth as needed for smoking cessation. 10/22/12   Tonia Brooms, MD  pantoprazole (PROTONIX) 20 MG tablet Take 1 tablet (20 mg total) by mouth daily. 04/01/13   Clinton Gallant, MD  promethazine (PHENERGAN) 25 MG tablet Take 1 tablet (25 mg total) by mouth every 6 (six) hours as needed for nausea. 10/11/12   Elwyn Lade, PA-C  rosuvastatin (CRESTOR) 10 MG tablet Take 1 tablet (10 mg total) by mouth at bedtime. 05/27/13 05/27/14  Clinton Gallant, MD   BP 161/61  Pulse 80  Temp(Src) 98.1 F (36.7 C) (Oral)  Resp 20  SpO2 100% Physical Exam  Constitutional: She appears well-developed and well-nourished. No distress.  Cardiovascular: Normal rate and  regular rhythm.   Pulmonary/Chest: Effort normal and breath sounds normal. She exhibits tenderness.      ED Course  Procedures (including critical care time) Labs Review Labs Reviewed - No data to display  Results for orders placed in visit on 01/14/13  GLUCOSE, CAPILLARY      Result Value Ref Range   Glucose-Capillary 140 (*) 70 - 99 mg/dL  POCT GLYCOSYLATED HEMOGLOBIN (HGB A1C)      Result Value Ref Range   Hemoglobin A1C 7.1     Imaging Review Dg Chest 2 View  08/11/2013   CLINICAL DATA:  Chest pain.  EXAM: CHEST  2 VIEW  COMPARISON:  12/22/2012  FINDINGS: Cardiac silhouette is borderline enlarged. Normal mediastinal and hilar contours.  Lungs are clear.  No pleural effusion.  No pneumothorax.  Bony thorax is intact.  IMPRESSION: No acute cardiopulmonary disease. Stable appearance from the prior exam.   Electronically Signed   By: Lajean Manes M.D.   On: 08/11/2013 11:19     MDM   1. Bronchitis   pt given albuterol 2.5mg  nebs here at [facility]. Pain improved, breathing easier, feels much better. Rx albuterol HFA 2 puffs q4hrs prn #1 inhaler.   EKG shows NSR, normal ekg 67bpm   Carvel Getting, NP 08/11/13 [place] Kabbe, NP 08/11/13 [phone]

## 2013-08-11 NOTE — ED Notes (Signed)
Pt assessed.  Pt c/o  Left sided chest pain that is felt with deep breathing, coughing, and sneezing.  On going over the past three days.  Symptoms come and goes.  Denies sob, sweats, n/v.   Pt has tried aleve with mild relief.    Hx  Of hypertension.    Pt is alert and oriented.  Sitting up right with no signs of distress.  Pt to inform front desk of any changes.  Pt voices understanding.  Mw,cma

## 2013-08-11 NOTE — ED Notes (Signed)
C/o  Left sided chest pain.   Pain is felt with deep breathing and coughing.  Pain comes and goes.  X 3 days.  Mild relief with aleve.  Denies sob, n/v, sweats and left arm pain.

## 2013-08-12 NOTE — ED Provider Notes (Signed)
Medical screening examination/treatment/procedure(s) were performed by a resident physician or non-physician practitioner and as the supervising physician I was immediately available for consultation/collaboration.  Lynne Leader, MD    Gregor Hams, MD 08/12/13 [phone]

## 2013-08-26 ENCOUNTER — Emergency Department (INDEPENDENT_AMBULATORY_CARE_PROVIDER_SITE_OTHER): Payer: Medicare Other

## 2013-08-26 ENCOUNTER — Encounter (HOSPITAL_COMMUNITY): Payer: Self-pay | Admitting: Emergency Medicine

## 2013-08-26 ENCOUNTER — Emergency Department (HOSPITAL_COMMUNITY)
Admission: EM | Admit: 2013-08-26 | Discharge: 2013-08-26 | Disposition: A | Discharge status: Home / Self Care | Payer: Medicare Other | Source: Home / Self Care | Attending: Family Medicine | Admitting: Family Medicine

## 2013-08-26 DIAGNOSIS — E1143 Type 2 diabetes mellitus with diabetic autonomic (poly)neuropathy: Secondary | ICD-10-CM

## 2013-08-26 DIAGNOSIS — E1149 Type 2 diabetes mellitus with other diabetic neurological complication: Secondary | ICD-10-CM

## 2013-08-26 DIAGNOSIS — G909 Disorder of the autonomic nervous system, unspecified: Secondary | ICD-10-CM

## 2013-08-26 MED ORDER — TRAMADOL HCL 50 MG PO TABS: 50.0000 mg | ORAL_TABLET | Freq: Four times a day (QID) | ORAL | Status: DC | PRN

## 2013-08-26 MED ORDER — GABAPENTIN 300 MG PO CAPS: 300.0000 mg | ORAL_CAPSULE | Freq: Three times a day (TID) | ORAL | Status: DC

## 2013-08-26 NOTE — Discharge Instructions (Signed)
Use medicine and shoe until seen by your doctor in 10 days for recheck,

## 2013-08-26 NOTE — ED Notes (Signed)
C/o foot pain x 1 week , denies injury . Pain 5th toe, worse when she removes her shoe

## 2013-08-26 NOTE — ED Provider Notes (Signed)
CSN: [id number]     Arrival date & time 08/26/13  1431 History   First MD Initiated Contact with Patient 08/26/13 1524     Chief Complaint  Patient presents with  . Foot Pain   (Consider location/radiation/quality/duration/timing/severity/associated sxs/prior Treatment) Patient is a 68 y.o. female presenting with lower extremity pain. The history is provided by the patient.  Foot Pain This is a new problem. The current episode started more than 1 week ago. The problem has been gradually worsening. Pertinent negatives include no chest pain and no abdominal pain. The symptoms are aggravated by walking.    Past Medical History  Diagnosis Date  . CAD (coronary artery disease)     s/p RCA stent 2007. Myoview 2010 normal  . DM (diabetes mellitus)   . HTN (hypertension)   . Hyperlipidemia   . GERD (gastroesophageal reflux disease)   . 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 Coronary angioplasty with stent placement    . Abdominal hysterectomy    . Shoulder surgery    . Knee surgery     History reviewed. No pertinent family history. History  Substance Use Topics  . Smoking status: Current Every Day Smoker -- 0.30 packs/day for 30 years    Types: Cigarettes  . Smokeless tobacco: Never Used     Comment: cutting back  . Alcohol Use: No   OB History   Grav Para Term Preterm Abortions TAB SAB Ect Mult Living                 Review of Systems  Constitutional: Negative.   Cardiovascular: Negative for chest pain.  Gastrointestinal: Negative.  Negative for abdominal pain.  Genitourinary: Negative.   Musculoskeletal: Positive for gait problem. Negative for back pain and joint swelling.    Allergies  Chantix and Codeine  Home Medications   Prior to Admission medications   Medication Sig Start Date End Date Taking? Authorizing Provider  albuterol (PROAIR HFA) 108 (90 BASE) MCG/ACT  inhaler Inhale 2 puffs into the lungs every 4 (four) hours as needed for wheezing or shortness of breath. 08/11/13   Carvel Getting, NP  aspirin EC 81 MG tablet Take 81 mg by mouth daily.    Historical Provider, MD  Blood Glucose Monitoring Suppl (AGAMATRIX PRESTO PRO METER) DEVI 1 Device by Does not apply route 4 (four) times daily. 07/09/12   Tonia Brooms, MD  carvedilol (COREG) 25 MG tablet Take 25 mg by mouth 2 (two) times daily with a meal.    Historical Provider, MD  chlorthalidone (HYGROTON) 25 MG tablet Take 1 tablet (25 mg total) by mouth daily. 04/07/13   Clinton Gallant, MD  clopidogrel (PLAVIX) 75 MG tablet Take 1 tablet (75 mg total) by mouth daily. 03/07/13   Clinton Gallant, MD  esomeprazole (NEXIUM) 20 MG capsule Take 1 capsule (20 mg total) by mouth daily at 12 noon. 05/27/13   Clinton Gallant, MD  fish oil-omega-3 fatty acids 1000 MG capsule Take 1 g by mouth 3 (three) times daily.      Historical Provider, MD  gabapentin (NEURONTIN) 100 MG capsule Take 1 capsule (100 mg total) by mouth 3 (three) times daily. 01/14/13 01/14/14  Clinton Gallant, MD  glucose blood (AGAMATRIX PRESTO TEST) test strip Use as instructed 07/09/12   Tonia Brooms, MD  glucose blood (ONE TOUCH ULTRA TEST) test strip Check blood sugar before and after  meals and bedtime 250.00 insulin requiring 10/28/12   Bartholomew Crews, MD  HYDROcodone-acetaminophen (NORCO/VICODIN) 5-325 MG per tablet Take 1 tablet by mouth every 6 (six) hours as needed for pain. 10/11/12   Elwyn Lade, PA-C  insulin aspart (NOVOLOG FLEXPEN) 100 UNIT/ML SOPN FlexPen Inject 1U per carbohydrate and 1 units per 40mg /dL increment blood sugar over 140, 60-99 inject 2 units, 100-140 inject 3 units, 141-180 inject 4 units, 181-220 inject 5 units, 221-260 inject 6 units, 261-300 inject 7 units, 301-340 inject 8 units 10/28/12   Bartholomew Crews, MD  Insulin Glargine (LANTUS SOLOSTAR) 100 UNIT/ML SOPN Inject 40 Units into the skin at bedtime. 10/28/12   Bartholomew Crews, MD  isosorbide mononitrate (IMDUR) 60 MG 24 hr tablet Take 1 tablet (60 mg total) by mouth daily. 02/25/13   Clinton Gallant, MD  lisinopril (PRINIVIL,ZESTRIL) 20 MG tablet Take 1 tablet (20 mg total) by mouth 2 (two) times daily. 02/24/13   Karren Cobble, MD  nicotine polacrilex (NICORETTE) 4 MG gum Take 1 each (4 mg total) by mouth as needed for smoking cessation. 10/22/12   Tonia Brooms, MD  pantoprazole (PROTONIX) 20 MG tablet Take 1 tablet (20 mg total) by mouth daily. 04/01/13   Clinton Gallant, MD  promethazine (PHENERGAN) 25 MG tablet Take 1 tablet (25 mg total) by mouth every 6 (six) hours as needed for nausea. 10/11/12   Elwyn Lade, PA-C  rosuvastatin (CRESTOR) 10 MG tablet Take 1 tablet (10 mg total) by mouth at bedtime. 05/27/13 05/27/14  Clinton Gallant, MD   BP 164/53  Pulse 76  Temp(Src) 98.2 F (36.8 C) (Oral)  Resp 16  SpO2 100% Physical Exam  Nursing note and vitals reviewed. Constitutional: She is oriented to person, place, and time. She appears well-developed and well-nourished.  Musculoskeletal: She exhibits tenderness.       Right foot: She exhibits tenderness and bony tenderness. She exhibits normal range of motion, no swelling and normal capillary refill.       Feet:  Neurological: She is alert and oriented to person, place, and time.  Skin: Skin is warm and dry.    ED Course  Procedures (including critical care time) Labs Review Labs Reviewed - No data to display  Imaging Review Dg Foot Complete Right  08/26/2013   CLINICAL DATA:  Right fifth toe pain.  No known injury.  EXAM: RIGHT FOOT COMPLETE - 3+ VIEW  COMPARISON:  None.  FINDINGS: No acute bony or joint abnormality is identified. No notable degenerative change is seen. There is no erosion. Very small plantar calcaneal spur is noted. Soft tissue structures are unremarkable.  IMPRESSION: Negative exam.   Electronically Signed   By: Inge Rise M.D.   On: 08/26/2013 15:39     MDM   1. Peripheral  autonomic neuropathy due to diabetes mellitus        Billy Fischer, MD 08/26/13 [phone]

## 2013-08-30 ENCOUNTER — Encounter: Payer: Self-pay | Admitting: Internal Medicine

## 2013-08-30 ENCOUNTER — Ambulatory Visit (INDEPENDENT_AMBULATORY_CARE_PROVIDER_SITE_OTHER): Payer: Medicare Other | Admitting: Internal Medicine

## 2013-08-30 VITALS — BP 146/68 | HR 83 | Temp 98.3°F | Ht 65.5 in | Wt 150.1 lb

## 2013-08-30 DIAGNOSIS — I10 Essential (primary) hypertension: Secondary | ICD-10-CM

## 2013-08-30 DIAGNOSIS — M79674 Pain in right toe(s): Secondary | ICD-10-CM

## 2013-08-30 DIAGNOSIS — E1159 Type 2 diabetes mellitus with other circulatory complications: Secondary | ICD-10-CM

## 2013-08-30 DIAGNOSIS — M79609 Pain in unspecified limb: Secondary | ICD-10-CM

## 2013-08-30 MED ORDER — SULFAMETHOXAZOLE-TMP DS 800-160 MG PO TABS
1.0000 | ORAL_TABLET | Freq: Two times a day (BID) | ORAL | Status: DC
Start: 2013-08-30 — End: 2013-09-16

## 2013-08-30 NOTE — Progress Notes (Signed)
HPI The patient is a 68 y.o. female with a history of DM, HTN, CAD, prior CVA, tobacco abuse, presenting for an urgent care f/u for foot pain.  The patient notes a 3-week history of R 5th toe pain.  The pain started gradually, and is described as a sharp pain, without radiation  Symptoms worsened by lifting toe, worsened by any pressure on the toe (besheet, sock), better with toe flat on the floor.  Pain is unchanged over the last 3 weeks.  The patient was seen in urgent care for this, diagnosed as diabetic neuropathy, sent home with Gabapentin 300 mg TID, and tramadol (pt did not fill either prescription, notes nightmares with gabapentin in the past), and was given an open-toe shoe.  X-ray foot unremarkable.  The patient has taken Aleve for the pain (4 tabs, twice/day), with no significant pain relief.  She notes a several year history of intermittent numbness/tingling to the level of the bilateral toes.  The patient has a history of DM.  She takes Lantus 40 nightly, and Novolog sliding scale.  She refuses A1C testing today, stating "I'm just here for my foot".  ROS: General: no fevers, chills, changes in weight, changes in appetite Skin: no rash HEENT: no blurry vision, hearing changes, sore throat Pulm: no dyspnea, coughing, wheezing CV: no chest pain, palpitations, shortness of breath Abd: no abdominal pain, nausea/vomiting, diarrhea/constipation GU: no dysuria, hematuria, polyuria Ext: see HPI Neuro: see HPI  Filed Vitals:   08/30/13 1521  BP: 146/68  Pulse: 83  Temp: 98.3 F (36.8 C)    PEX General: alert, cooperative, and in no apparent distress HEENT: pupils equal round and reactive to light, vision grossly intact, oropharynx clear and non-erythematous  Neck: supple Lungs: clear to ascultation bilaterally, normal work of respiration, no wheezes, rales, ronchi Heart: regular rate and rhythm, no murmurs, gallops, or rubs Abdomen: soft, non-tender, non-distended, normal bowel  sounds Extremities: R 5th toe with edema, mild erythema, and mild warmth, as well as mild hyperpigmentation to the lateral aspect of the toe Neurologic: alert & oriented X3, cranial nerves II-XII intact, strength grossly intact, sensation intact to light touch  Current Outpatient Prescriptions on File Prior to Visit  Medication Sig Dispense Refill  . albuterol (PROAIR HFA) 108 (90 BASE) MCG/ACT inhaler Inhale 2 puffs into the lungs every 4 (four) hours as needed for wheezing or shortness of breath.  1 Inhaler  0  . aspirin EC 81 MG tablet Take 81 mg by mouth daily.      . Blood Glucose Monitoring Suppl (AGAMATRIX PRESTO PRO METER) DEVI 1 Device by Does not apply route 4 (four) times daily.  1 Device  0  . carvedilol (COREG) 25 MG tablet Take 25 mg by mouth 2 (two) times daily with a meal.      . chlorthalidone (HYGROTON) 25 MG tablet Take 1 tablet (25 mg total) by mouth daily.  30 tablet  11  . clopidogrel (PLAVIX) 75 MG tablet Take 1 tablet (75 mg total) by mouth daily.  90 tablet  4  . esomeprazole (NEXIUM) 20 MG capsule Take 1 capsule (20 mg total) by mouth daily at 12 noon.  30 capsule  12  . fish oil-omega-3 fatty acids 1000 MG capsule Take 1 g by mouth 3 (three) times daily.        Marland Kitchen gabapentin (NEURONTIN) 100 MG capsule Take 1 capsule (100 mg total) by mouth 3 (three) times daily.  90 capsule  2  . gabapentin (  NEURONTIN) 300 MG capsule Take 1 capsule (300 mg total) by mouth 3 (three) times daily.  90 capsule  1  . glucose blood (AGAMATRIX PRESTO TEST) test strip Use as instructed  100 each  12  . glucose blood (ONE TOUCH ULTRA TEST) test strip Check blood sugar before and after meals and bedtime 250.00 insulin requiring  150 each  12  . HYDROcodone-acetaminophen (NORCO/VICODIN) 5-325 MG per tablet Take 1 tablet by mouth every 6 (six) hours as needed for pain.  12 tablet  0  . insulin aspart (NOVOLOG FLEXPEN) 100 UNIT/ML SOPN FlexPen Inject 1U per carbohydrate and 1 units per 40mg /dL  increment blood sugar over 140, 60-99 inject 2 units, 100-140 inject 3 units, 141-180 inject 4 units, 181-220 inject 5 units, 221-260 inject 6 units, 261-300 inject 7 units, 301-340 inject 8 units  15 mL  2  . Insulin Glargine (LANTUS SOLOSTAR) 100 UNIT/ML SOPN Inject 40 Units into the skin at bedtime.  15 mL  2  . isosorbide mononitrate (IMDUR) 60 MG 24 hr tablet Take 1 tablet (60 mg total) by mouth daily.  90 tablet  3  . lisinopril (PRINIVIL,ZESTRIL) 20 MG tablet Take 1 tablet (20 mg total) by mouth 2 (two) times daily.  180 tablet  3  . nicotine polacrilex (NICORETTE) 4 MG gum Take 1 each (4 mg total) by mouth as needed for smoking cessation.  110 tablet  2  . pantoprazole (PROTONIX) 20 MG tablet Take 1 tablet (20 mg total) by mouth daily.  90 tablet  4  . promethazine (PHENERGAN) 25 MG tablet Take 1 tablet (25 mg total) by mouth every 6 (six) hours as needed for nausea.  12 tablet  0  . rosuvastatin (CRESTOR) 10 MG tablet Take 1 tablet (10 mg total) by mouth at bedtime.  30 tablet  12  . traMADol (ULTRAM) 50 MG tablet Take 1 tablet (50 mg total) by mouth every 6 (six) hours as needed. Prn pain  15 tablet  0   No current facility-administered medications on file prior to visit.    Assessment/Plan

## 2013-08-30 NOTE — Assessment & Plan Note (Signed)
BP Readings from Last 3 Encounters:  08/30/13 146/68  08/26/13 164/53  08/11/13 161/61    Lab Results  Component Value Date   NA 141 10/22/2012   K 4.6 10/22/2012   CREATININE 1.17* 10/22/2012    Assessment: Blood pressure control: mildly elevated Progress toward BP goal:  at goal Comments: BP mildly elevated, likely due to pain  Plan: Medications:  continue current medications Educational resources provided:   Self management tools provided:   Other plans: Recheck at follow-up visit in 2-3 weeks

## 2013-08-30 NOTE — Patient Instructions (Signed)
General Instructions: Your toe pain may represent Cellulitis, an infection of the toe. -take Bactrim, 1 tablet twice per day for 10 days -you may take tylenol, aleve, or Tramadol for pain -continue to wear the shoe the ED gave you, to reduce pressure on your toe  Please return for a follow-up visit in 2-3 weeks.   Treatment Goals:  Goals (1 Years of Data) as of 08/30/13         As of Today 08/26/13 08/11/13 01/14/13 10/22/12     Blood Pressure    . Blood Pressure < 140/80  146/68 164/53 161/61 134/70 143/76     Result Component    . HEMOGLOBIN A1C < 7.0     7.1 8.4    . LDL CALC < 70      73      Progress Toward Treatment Goals:  Treatment Goal 08/30/2013  Hemoglobin A1C -  Blood pressure at goal  Stop smoking smoking the same amount    Self Care Goals & Plans:  Self Care Goal 01/14/2013  Manage my medications take my medicines as prescribed; bring my medications to every visit; refill my medications on time  Monitor my health bring my glucose meter and log to each visit  Eat healthy foods drink diet soda or water instead of juice or soda; eat more vegetables; eat foods that are low in salt; eat baked foods instead of fried foods; eat fruit for snacks and desserts  Be physically active -  Stop smoking -    Home Blood Glucose Monitoring 10/22/2012  Check my blood sugar 6 times a day  When to check my blood sugar before breakfast; before meals; after breakfast; after lunch; after dinner     Care Management & Community Referrals:  Referral 08/30/2013  Referrals made for care management support none needed

## 2013-08-30 NOTE — Assessment & Plan Note (Signed)
The patient presents with a two-week history of right toe pain. On physical exam, the area is swollen, mildly red, mildly warm. X-ray obtained in urgent care shows no fracture. Doppler ultrasounds of DP and PT pulses in office today were normal. ABI from 2010 shows no peripheral vascular disease. I believe this most likely represents a cellulitis of the right fifth toe. Unlikely fracture (normal x-ray) versus arterial occlusion (normal ABI, normal dopplers, no gangrene seen) versus peripheral neuropathy (acute onset). -Prescribed Bactrim for cellulitis -Patient instructed to take tramadol as needed for pain -Continue to wear open toed orthotic shoe given by ED

## 2013-09-01 NOTE — Progress Notes (Signed)
I saw and evaluated the patient.  I personally confirmed the key portions of the history and exam documented by Dr. Brown and I reviewed pertinent patient test results.  The assessment, diagnosis, and plan were formulated together and I agree with the documentation in the resident's note. 

## 2013-09-16 ENCOUNTER — Encounter: Payer: Self-pay | Admitting: Internal Medicine

## 2013-09-16 ENCOUNTER — Ambulatory Visit (INDEPENDENT_AMBULATORY_CARE_PROVIDER_SITE_OTHER): Payer: Medicare Other | Admitting: Internal Medicine

## 2013-09-16 VITALS — BP 144/71 | HR 93 | Temp 98.0°F | Ht 65.5 in | Wt 151.5 lb

## 2013-09-16 DIAGNOSIS — M25562 Pain in left knee: Secondary | ICD-10-CM

## 2013-09-16 DIAGNOSIS — I10 Essential (primary) hypertension: Secondary | ICD-10-CM

## 2013-09-16 DIAGNOSIS — M25569 Pain in unspecified knee: Secondary | ICD-10-CM

## 2013-09-16 DIAGNOSIS — E1159 Type 2 diabetes mellitus with other circulatory complications: Secondary | ICD-10-CM

## 2013-09-16 LAB — POCT GLYCOSYLATED HEMOGLOBIN (HGB A1C): Hemoglobin A1C: 8.2

## 2013-09-16 LAB — GLUCOSE, CAPILLARY: Glucose-Capillary: 99 mg/dL (ref 70–99)

## 2013-09-16 MED ORDER — INSULIN ASPART 100 UNIT/ML FLEXPEN: PEN_INJECTOR | SUBCUTANEOUS | Status: DC

## 2013-09-16 MED ORDER — ROSUVASTATIN CALCIUM 10 MG PO TABS: 10.0000 mg | ORAL_TABLET | Freq: Every day | ORAL | Status: DC

## 2013-09-16 MED ORDER — INSULIN GLARGINE 100 UNIT/ML SOLOSTAR PEN: 40.0000 [IU] | PEN_INJECTOR | Freq: Every day | SUBCUTANEOUS | Status: DC

## 2013-09-16 MED ORDER — CHLORTHALIDONE 25 MG PO TABS: 25.0000 mg | ORAL_TABLET | Freq: Every day | ORAL | Status: DC

## 2013-09-16 MED ORDER — CARVEDILOL 25 MG PO TABS: 25.0000 mg | ORAL_TABLET | Freq: Two times a day (BID) | ORAL | Status: DC

## 2013-09-16 NOTE — Progress Notes (Signed)
   Subjective:    Patient ID: Stephanie Gross, female    DOB: 1946/02/20, 68 y.o.   MRN: [id number]  HPI  Stephanie Gross is a 68 yo woman pmh as listed below presents for follow up on her right toe cellulitis and BP recheck.   Pt was recently seen and treated for a cellulitis on 08/30/13 and states she was compliant and completed her Bactrim and is now feeling that the ulceration/wound has completely resolved.   Today she complains of some acute left knee pain that appeared suddenly and is painful to the touch. It had started with a "round hot and red spot" that was extremely tender even being sensitive to her sheets and putting clothes on would cause pain. It has been troublesome to move from sitting to standing position on that side 2/2 pain as well. She reports that she has been taking ibuprofen or aleeve with some minimal relief over this time period. She has had a similar episode about 4 months ago in the same location with similar presentation that resolved with NSAIDs and time of about 7-10 days per the patient. She has never had gout before or a family hx of gout. The patient denies any recent fever, chills, trauma to the site, puncture wounds, new sexual partners, hardware in that knee, or nausea/vomiting/diarrhea.   Review of Systems  Constitutional: Negative for fever, chills and fatigue.  Respiratory: Negative for shortness of breath.   Cardiovascular: Negative for chest pain, palpitations and leg swelling.  Gastrointestinal: Negative for nausea, vomiting, abdominal pain and diarrhea.  Musculoskeletal: Positive for arthralgias (mono over left knee ), gait problem (only limited by pain with left knee) and joint swelling (slight over left knee but resolving per patient). Negative for back pain, myalgias, neck pain and neck stiffness.  Skin: Negative for color change, rash and wound.  Neurological: Negative for weakness and numbness.       Objective:   Physical Exam Filed Vitals:   09/16/13 1522  BP: 144/71  Pulse: 93  Temp: 98 F (36.7 C)   General: sitting in chair, NAD HEENT: PERRL, EOMI, no scleral icterus Cardiac: RRR, no rubs, murmurs or gallops Pulm: clear to auscultation bilaterally, moving normal volumes of air Abd: soft, nontender, nondistended, BS present Ext: warm and well perfused, no pedal edema MSK: left knee with well healed vertical scar, no effusion, slightly warm, ttp below patella, knee FROM and only limited slightly by pain, normal ambulation,  Neuro: alert and oriented X3, cranial nerves II-XII grossly intact     Assessment & Plan:  Please see problem oriented charting  Pt discussed with Dr. Stann Mainland

## 2013-09-17 DIAGNOSIS — M25562 Pain in left knee: Secondary | ICD-10-CM | POA: Insufficient documentation

## 2013-09-17 NOTE — Assessment & Plan Note (Signed)
Lab Results  Component Value Date   HGBA1C 8.2 09/16/2013   Pt continues to have fair/poor control. Patient doesn't want to address DM management this visit as having knee pain. Stressed importance of good glycemic control and readjusting insulin.  -refills provided  -f/u after acute knee pain

## 2013-09-17 NOTE — Assessment & Plan Note (Signed)
BP Readings from Last 3 Encounters:  09/16/13 144/71  08/30/13 146/68  08/26/13 164/53   Pt better control today and previous elevated probably 2/2 acute pain. Pt not interested in changing medications. Continue current medications.  -refills provided

## 2013-09-17 NOTE — Assessment & Plan Note (Signed)
DDx includes infection vs gout vs trauma. Review of pts last images shows that she had a fracture repair as the etiology of her scar and has no hardware in the knee. Pt seems to have symptoms typical of a gout flare by the sudden onset relief with NSAIDs and recurrence. Pt was questioned about diet and denied etoh, cheeses, meats. During visit pt was told an arthrocentesis needed to rule out gout vs infection and pt denied. "I will not accept a diagnosis of gout. I will never allow any needles." the option of preemptively treating for gout to provide patient with relief was then discussed where pt replied "I will never accept the diagnosis of gout and if I go under treatment I will want valium." Extensive education into the nature of gout as a reversible and easily treated condition was repeated where patient then decided to exit the session stating "I will just come back on my own time because I will never accept you to say I have gout or even try to treat me for gout because I believe I will never have it."  -pt was advised that if her symptoms got worse she should call or present to UC or ED for evaluation and management

## 2013-09-20 NOTE — Progress Notes (Signed)
Case discussed with Dr. Sadek soon after the resident saw the patient.  We reviewed the resident's history and exam and pertinent patient test results.  I agree with the assessment, diagnosis, and plan of care documented in the resident's note. 

## 2013-09-21 ENCOUNTER — Telehealth: Payer: Self-pay | Admitting: Licensed Clinical Social Worker

## 2013-09-21 ENCOUNTER — Encounter: Payer: Self-pay | Admitting: Licensed Clinical Social Worker

## 2013-09-21 NOTE — Telephone Encounter (Signed)
[name] was referred to CSW for prescription assistance.  Pt has Medicare and and a Part D program.  When pt went to fill her Rx for Crestor, pt's cost was $163, after insurance.  Pt states she is unable to afford the cost of the Crestor along with the cost of her other medications.  Pt is currently using coupons she received to cover cost.  Crestor does have a manufacturer program available for Medicare Part D.  CSW informed Stephanie Gross of [employer] Medicare Part D program and the [employer].  Pt will need to apply for the [employer] program first and then apply to [employer].  Pt aware and CSW will mail Stephanie Gross information.

## 2013-10-07 ENCOUNTER — Other Ambulatory Visit: Payer: Self-pay | Admitting: Internal Medicine

## 2013-11-07 ENCOUNTER — Emergency Department (HOSPITAL_COMMUNITY)
Admission: EM | Admit: 2013-11-07 | Discharge: 2013-11-08 | Disposition: A | Discharge status: Home / Self Care | Payer: Medicare Other | Attending: Emergency Medicine | Admitting: Emergency Medicine

## 2013-11-07 ENCOUNTER — Encounter (HOSPITAL_COMMUNITY): Payer: Self-pay | Admitting: Emergency Medicine

## 2013-11-07 ENCOUNTER — Emergency Department (INDEPENDENT_AMBULATORY_CARE_PROVIDER_SITE_OTHER)
Admission: EM | Admit: 2013-11-07 | Discharge: 2013-11-07 | Payer: Medicare Other | Source: Home / Self Care | Attending: Family Medicine | Admitting: Family Medicine

## 2013-11-07 DIAGNOSIS — Z90710 Acquired absence of both cervix and uterus: Secondary | ICD-10-CM | POA: Insufficient documentation

## 2013-11-07 DIAGNOSIS — R52 Pain, unspecified: Secondary | ICD-10-CM

## 2013-11-07 DIAGNOSIS — K274 Chronic or unspecified peptic ulcer, site unspecified, with hemorrhage: Secondary | ICD-10-CM

## 2013-11-07 DIAGNOSIS — K250 Acute gastric ulcer with hemorrhage: Secondary | ICD-10-CM | POA: Insufficient documentation

## 2013-11-07 DIAGNOSIS — Z9089 Acquired absence of other organs: Secondary | ICD-10-CM | POA: Insufficient documentation

## 2013-11-07 DIAGNOSIS — Z7902 Long term (current) use of antithrombotics/antiplatelets: Secondary | ICD-10-CM | POA: Insufficient documentation

## 2013-11-07 DIAGNOSIS — K279 Peptic ulcer, site unspecified, unspecified as acute or chronic, without hemorrhage or perforation: Secondary | ICD-10-CM | POA: Insufficient documentation

## 2013-11-07 DIAGNOSIS — F17200 Nicotine dependence, unspecified, uncomplicated: Secondary | ICD-10-CM | POA: Insufficient documentation

## 2013-11-07 DIAGNOSIS — Z7982 Long term (current) use of aspirin: Secondary | ICD-10-CM | POA: Insufficient documentation

## 2013-11-07 DIAGNOSIS — I10 Essential (primary) hypertension: Secondary | ICD-10-CM | POA: Insufficient documentation

## 2013-11-07 DIAGNOSIS — D649 Anemia, unspecified: Secondary | ICD-10-CM | POA: Insufficient documentation

## 2013-11-07 DIAGNOSIS — E119 Type 2 diabetes mellitus without complications: Secondary | ICD-10-CM | POA: Insufficient documentation

## 2013-11-07 DIAGNOSIS — Z794 Long term (current) use of insulin: Secondary | ICD-10-CM | POA: Insufficient documentation

## 2013-11-07 DIAGNOSIS — R1013 Epigastric pain: Secondary | ICD-10-CM

## 2013-11-07 HISTORY — DX: Essential (primary) hypertension: I10

## 2013-11-07 LAB — URINALYSIS, ROUTINE W REFLEX MICROSCOPIC
BILIRUBIN URINE: NEGATIVE
GLUCOSE, UA: NEGATIVE mg/dL
HGB URINE DIPSTICK: NEGATIVE
KETONES UR: NEGATIVE mg/dL
Nitrite: NEGATIVE
PH: 6 (ref 5.0–8.0)
Protein, ur: 100 mg/dL — AB
SPECIFIC GRAVITY, URINE: 1.016 (ref 1.005–1.030)
Urobilinogen, UA: 0.2 mg/dL (ref 0.0–1.0)

## 2013-11-07 LAB — COMPREHENSIVE METABOLIC PANEL
ALK PHOS: 120 U/L — AB (ref 39–117)
ALT: 11 U/L (ref 0–35)
AST: 14 U/L (ref 0–37)
Albumin: 3.7 g/dL (ref 3.5–5.2)
Anion gap: 12 (ref 5–15)
BUN: 16 mg/dL (ref 6–23)
CO2: 24 meq/L (ref 19–32)
CREATININE: 1 mg/dL (ref 0.50–1.10)
Calcium: 10.2 mg/dL (ref 8.4–10.5)
Chloride: 108 mEq/L (ref 96–112)
GFR calc Af Amer: 67 mL/min — ABNORMAL LOW (ref >90)
GFR, EST NON AFRICAN AMERICAN: 57 mL/min — AB (ref >90)
Glucose, Bld: 93 mg/dL (ref 70–99)
Potassium: 4 mEq/L (ref 3.7–5.3)
SODIUM: 144 meq/L (ref 137–147)
Total Bilirubin: <0.2 mg/dL — ABNORMAL LOW (ref 0.3–1.2)
Total Protein: 7.7 g/dL (ref 6.0–8.3)

## 2013-11-07 LAB — CBC WITH DIFFERENTIAL/PLATELET
Basophils Absolute: 0 10*3/uL (ref 0.0–0.1)
Basophils Relative: 1 % (ref 0–1)
Eosinophils Absolute: 0.2 10*3/uL (ref 0.0–0.7)
Eosinophils Relative: 2 % (ref 0–5)
HEMATOCRIT: 33.5 % — AB (ref 36.0–46.0)
Hemoglobin: 10.2 g/dL — ABNORMAL LOW (ref 12.0–15.0)
Lymphocytes Relative: 33 % (ref 12–46)
Lymphs Abs: 2.7 10*3/uL (ref 0.7–4.0)
MCH: 25.6 pg — ABNORMAL LOW (ref 26.0–34.0)
MCHC: 30.4 g/dL (ref 30.0–36.0)
MCV: 84 fL (ref 78.0–100.0)
MONO ABS: 0.6 10*3/uL (ref 0.1–1.0)
Monocytes Relative: 7 % (ref 3–12)
NEUTROS ABS: 4.7 10*3/uL (ref 1.7–7.7)
Neutrophils Relative %: 57 % (ref 43–77)
Platelets: 244 10*3/uL (ref 150–400)
RBC: 3.99 MIL/uL (ref 3.87–5.11)
RDW: 17.6 % — ABNORMAL HIGH (ref 11.5–15.5)
WBC: 8.1 10*3/uL (ref 4.0–10.5)

## 2013-11-07 LAB — URINE MICROSCOPIC-ADD ON

## 2013-11-07 LAB — LIPASE, BLOOD: Lipase: 19 U/L (ref 11–59)

## 2013-11-07 LAB — POC OCCULT BLOOD, ED: FECAL OCCULT BLD: POSITIVE — AB

## 2013-11-07 MED ORDER — GI COCKTAIL ~~LOC~~
30.0000 mL | Freq: Once | ORAL | Status: AC
  Administered 2013-11-07: 30 mL via ORAL
  Filled 2013-11-07: qty 30

## 2013-11-07 MED ORDER — ONDANSETRON 4 MG PO TBDP
8.0000 mg | ORAL_TABLET | Freq: Once | ORAL | Status: AC
  Administered 2013-11-07: 8 mg via ORAL
  Filled 2013-11-07: qty 2

## 2013-11-07 NOTE — ED Notes (Signed)
Apologized to pt for wait time. Pt reports continued nausea. Pt denies any pain at this time. NAD.

## 2013-11-07 NOTE — ED Provider Notes (Signed)
CSN: [id number]     Arrival date & time 11/07/13  1723 History   First MD Initiated Contact with Patient 11/07/13 1843     Chief Complaint  Patient presents with  . Abdominal Pain   (Consider location/radiation/quality/duration/timing/severity/associated sxs/prior Treatment) Patient is a 68 y.o. female presenting with abdominal pain. The history is provided by the patient.  Abdominal Pain Pain location:  Epigastric Pain quality: burning and sharp   Pain radiates to:  Does not radiate Pain severity:  Moderate Onset quality:  Gradual Duration:  2 weeks Progression:  Unchanged Chronicity:  New Associated symptoms: diarrhea and nausea   Associated symptoms: no constipation, no fever and no vomiting     Past Medical History  Diagnosis Date  . Hypertension   . Diabetes mellitus without complication    History reviewed. No pertinent past surgical history. History reviewed. No pertinent family history. History  Substance Use Topics  . Smoking status: Current Every Day Smoker  . Smokeless tobacco: Not on file  . Alcohol Use: No   OB History   Grav Para Term Preterm Abortions TAB SAB Ect Mult Living                 Review of Systems  Constitutional: Negative.  Negative for fever.  Respiratory: Negative.   Cardiovascular: Negative.   Gastrointestinal: Positive for nausea, abdominal pain and diarrhea. Negative for vomiting and constipation.    Allergies  Review of patient's allergies indicates no known allergies.  Home Medications   Prior to Admission medications   Medication Sig Start Date End Date Taking? Authorizing Provider  aspirin 81 MG tablet Take 81 mg by mouth daily.   Yes Historical Provider, MD  carvedilol (COREG) 25 MG tablet Take 25 mg by mouth 2 (two) times daily with a meal.   Yes Historical Provider, MD  chlorthalidone (HYGROTON) 25 MG tablet Take 25 mg by mouth daily.   Yes Historical Provider, MD  clopidogrel (PLAVIX) 75 MG tablet Take 75 mg by mouth  daily.   Yes Historical Provider, MD  insulin aspart (NOVOLOG) 100 UNIT/ML injection Inject into the skin 3 (three) times daily before meals.   Yes Historical Provider, MD  insulin glargine (LANTUS) 100 UNIT/ML injection Inject into the skin at bedtime.   Yes Historical Provider, MD  isosorbide mononitrate (IMDUR) 60 MG 24 hr tablet Take 60 mg by mouth daily.   Yes Historical Provider, MD  lisinopril (PRINIVIL,ZESTRIL) 20 MG tablet Take 20 mg by mouth daily.   Yes Historical Provider, MD   BP 189/64  Pulse 75  Temp(Src) 98.4 F (36.9 C) (Oral)  SpO2 100% Physical Exam  Nursing note and vitals reviewed. Constitutional: She is oriented to person, place, and time. She appears well-developed and well-nourished.  Neck: Normal range of motion. Neck supple.  Cardiovascular: Regular rhythm and normal heart sounds.   Pulmonary/Chest: Effort normal and breath sounds normal.  Abdominal: Soft. Bowel sounds are normal. She exhibits no distension and no mass. There is no hepatosplenomegaly. There is tenderness in the epigastric area. There is no rigidity, no rebound, no guarding, no CVA tenderness, no tenderness at McBurney's point and negative Murphy's sign.    Lymphadenopathy:    She has no cervical adenopathy.  Neurological: She is alert and oriented to person, place, and time.  Skin: Skin is warm and dry.    ED Course  Procedures (including critical care time) Labs Review Labs Reviewed - No data to display  Imaging Review No results found.  MDM   1. Abdominal pain, acute, epigastric    Sent for eval of 2wk of persistent nausea and diarrhea.stool prob dark from pepto use, no fever.    Billy Fischer, MD 11/07/13 [phone]

## 2013-11-07 NOTE — ED Provider Notes (Signed)
Is an CSN: [id number]     Arrival date & time 11/07/13  1910 History   First MD Initiated Contact with Patient 11/07/13 2259     Chief Complaint  Patient presents with  . Abdominal Pain     (Consider location/radiation/quality/duration/timing/severity/associated sxs/prior Treatment) HPI Comments: [name] is a 68 year old female who presents to the hospital from the urgent care where she had complained of abdominal pain. She states this has been going on for 2 weeks, it is epigastric in location, she describes it as a burning and cramping feeling that does not radiate and does not go to her back. She states it is intermittent, it comes and goes without any specific exacerbating or alleviating factors. She does notice after having a loose stool she will have a temporary relief of her symptoms. She also notes having dark black stools over the last week, this coincides with the use of Pepto-Bismol which does not help her symptoms. She does take a daily baby aspirin as well as Naprosyn several times a week for general body aches. She has no history of significant abdominal complaints or surgery although she has had a cholecystectomy years ago. She denies dysuria vomiting chest pain shortness of breath fevers or chills. She is nauseated  Patient is a 68 y.o. female presenting with abdominal pain. The history is provided by the patient and medical records.  Abdominal Pain   Past Medical History  Diagnosis Date  . Hypertension   . Diabetes mellitus without complication    Past Surgical History  Procedure Laterality Date  . Cholecystectomy    . Abdominal hysterectomy     No family history on file. History  Substance Use Topics  . Smoking status: Current Every Day Smoker    Types: Cigarettes  . Smokeless tobacco: Not on file  . Alcohol Use: No   OB History   Grav Para Term Preterm Abortions TAB SAB Ect Mult Living                 Review of Systems  Gastrointestinal: Positive for  abdominal pain.  All other systems reviewed and are negative.     Allergies  Codeine and Statins  Home Medications   Prior to Admission medications   Medication Sig Start Date End Date Taking? Authorizing Provider  aspirin 81 MG tablet Take 81 mg by mouth daily.   Yes Historical Provider, MD  carvedilol (COREG) 25 MG tablet Take 25 mg by mouth 2 (two) times daily with a meal.   Yes Historical Provider, MD  chlorthalidone (HYGROTON) 25 MG tablet Take 25 mg by mouth daily.   Yes Historical Provider, MD  clopidogrel (PLAVIX) 75 MG tablet Take 75 mg by mouth daily.   Yes Historical Provider, MD  insulin aspart (NOVOLOG) 100 UNIT/ML injection Inject 4-5 Units into the skin 3 (three) times daily as needed for high blood sugar.    Yes Historical Provider, MD  insulin glargine (LANTUS) 100 UNIT/ML injection Inject 40 Units into the skin at bedtime.    Yes Historical Provider, MD  isosorbide mononitrate (IMDUR) 60 MG 24 hr tablet Take 60 mg by mouth daily.   Yes Historical Provider, MD  lisinopril (PRINIVIL,ZESTRIL) 20 MG tablet Take 20 mg by mouth daily.   Yes Historical Provider, MD  rosuvastatin (CRESTOR) 10 MG tablet Take 10 mg by mouth daily.   Yes Historical Provider, MD  famotidine (PEPCID) 20 MG tablet Take 1 tablet (20 mg total) by mouth 2 (two) times daily. 11/08/13  Johnna Acosta, MD  lansoprazole (PREVACID) 30 MG capsule Take 1 capsule (30 mg total) by mouth daily at 12 noon. 11/08/13   Johnna Acosta, MD   BP 176/57  Pulse 66  Temp(Src) 98.4 F (36.9 C) (Oral)  Resp 17  Wt 147 lb 6 oz (66.849 kg)  SpO2 96% Physical Exam  Nursing note and vitals reviewed. Constitutional: She appears well-developed and well-nourished. No distress.  HENT:  Head: Normocephalic and atraumatic.  Mouth/Throat: Oropharynx is clear and moist. No oropharyngeal exudate.  Eyes: Conjunctivae and EOM are normal. Pupils are equal, round, and reactive to light. Right eye exhibits no discharge. Left eye  exhibits no discharge. No scleral icterus.  Neck: Normal range of motion. Neck supple. No JVD present. No thyromegaly present.  Cardiovascular: Normal rate, regular rhythm, normal heart sounds and intact distal pulses.  Exam reveals no gallop and no friction rub.   No murmur heard. Pulmonary/Chest: Effort normal and breath sounds normal. No respiratory distress. She has no wheezes. She has no rales.  Abdominal: Soft. Bowel sounds are normal. She exhibits no distension and no mass. There is no tenderness ( Minimal epigastric tenderness, no guarding).  No pain at McBurney's point, no other abdominal tenderness, very soft  Musculoskeletal: Normal range of motion. She exhibits no edema and no tenderness.  Lymphadenopathy:    She has no cervical adenopathy.  Neurological: She is alert. Coordination normal.  Skin: Skin is warm and dry. No rash noted. No erythema.  Psychiatric: She has a normal mood and affect. Her behavior is normal.    ED Course  Procedures (including critical care time) Labs Review Labs Reviewed  CBC WITH DIFFERENTIAL - Abnormal; Notable for the following:    Hemoglobin 10.2 (*)    HCT 33.5 (*)    MCH 25.6 (*)    RDW 17.6 (*)    All other components within normal limits  COMPREHENSIVE METABOLIC PANEL - Abnormal; Notable for the following:    Alkaline Phosphatase 120 (*)    Total Bilirubin <0.2 (*)    GFR calc non Af Amer 57 (*)    GFR calc Af Amer 67 (*)    All other components within normal limits  URINALYSIS, ROUTINE W REFLEX MICROSCOPIC - Abnormal; Notable for the following:    APPearance CLOUDY (*)    Protein, ur 100 (*)    Leukocytes, UA SMALL (*)    All other components within normal limits  URINE MICROSCOPIC-ADD ON - Abnormal; Notable for the following:    Squamous Epithelial / LPF FEW (*)    Bacteria, UA MANY (*)    Casts HYALINE CASTS (*)    All other components within normal limits  POC OCCULT BLOOD, ED - Abnormal; Notable for the following:    Fecal  Occult Bld POSITIVE (*)    All other components within normal limits  LIPASE, BLOOD    Imaging Review No results found.   MDM   Final diagnoses:  Peptic ulcer disease  Gastrointestinal hemorrhage associated with peptic ulcer  Anemia, unspecified anemia type    The patient is well appearing with normal vital signs other than hypertension, she has no fever, no tachycardia and normal oxygenation. At this time the patient will need a medical to evaluate for blood in her stool as her hemoglobin is low, although the lab work is within normal limits. GI cocktail  Stools are Hemoccult positive, I had a long discussion with the patient at the bedside regarding her need for  further evaluation by gastroenterology and the need to start antiacid medications this evening, will give first dose, prescriptions. The patient is adamant that not being admitted to the hospital. I will discuss this with the internal medicine resident, she goes to this clinic and have them followup with her for urgent followup and GI referral.  Discussed with Dr. Ronnald Ramp of the internal medicine resident service, and he will arrange for a followup in the next one to 2 days.  Meds given in ED:  Medications  ondansetron (ZOFRAN-ODT) disintegrating tablet 8 mg (8 mg Oral Given 11/07/13 2145)  gi cocktail (Maalox,Lidocaine,Donnatal) (30 mLs Oral Given 11/07/13 2317)  famotidine (PEPCID) tablet 20 mg (20 mg Oral Given 11/08/13 0039)  pantoprazole (PROTONIX) EC tablet 40 mg (40 mg Oral Given 11/08/13 0039)    New Prescriptions   FAMOTIDINE (PEPCID) 20 MG TABLET    Take 1 tablet (20 mg total) by mouth 2 (two) times daily.   LANSOPRAZOLE (PREVACID) 30 MG CAPSULE    Take 1 capsule (30 mg total) by mouth daily at 12 noon.      Johnna Acosta, MD 11/08/13 [phone]

## 2013-11-07 NOTE — ED Notes (Signed)
Pt states she has been having ABD pain for two weeks with minor relief from Pepto bismol.  Pt states black watery stools.

## 2013-11-07 NOTE — ED Notes (Signed)
2 week duration of pain epigastric area not relieved w pepto bisol, but stools have turned dark. Has nausea, but no vomiting

## 2013-11-08 ENCOUNTER — Encounter: Payer: Self-pay | Admitting: Nurse Practitioner

## 2013-11-08 ENCOUNTER — Ambulatory Visit (INDEPENDENT_AMBULATORY_CARE_PROVIDER_SITE_OTHER): Payer: Medicare Other | Admitting: Internal Medicine

## 2013-11-08 ENCOUNTER — Encounter: Payer: Self-pay | Admitting: Internal Medicine

## 2013-11-08 VITALS — BP 156/71 | HR 70 | Temp 98.3°F | Ht 65.5 in | Wt 149.0 lb

## 2013-11-08 DIAGNOSIS — E785 Hyperlipidemia, unspecified: Secondary | ICD-10-CM

## 2013-11-08 DIAGNOSIS — R1013 Epigastric pain: Secondary | ICD-10-CM

## 2013-11-08 DIAGNOSIS — E1159 Type 2 diabetes mellitus with other circulatory complications: Secondary | ICD-10-CM

## 2013-11-08 DIAGNOSIS — I10 Essential (primary) hypertension: Secondary | ICD-10-CM

## 2013-11-08 LAB — GLUCOSE, CAPILLARY: GLUCOSE-CAPILLARY: 116 mg/dL — AB (ref 70–99)

## 2013-11-08 MED ORDER — LANSOPRAZOLE 30 MG PO CPDR: 30.0000 mg | DELAYED_RELEASE_CAPSULE | Freq: Every day | ORAL | Status: DC

## 2013-11-08 MED ORDER — FAMOTIDINE 20 MG PO TABS
20.0000 mg | ORAL_TABLET | Freq: Once | ORAL | Status: AC
  Administered 2013-11-08: 20 mg via ORAL
  Filled 2013-11-08: qty 1

## 2013-11-08 MED ORDER — FAMOTIDINE 20 MG PO TABS: 20.0000 mg | ORAL_TABLET | Freq: Two times a day (BID) | ORAL | Status: DC

## 2013-11-08 MED ORDER — PANTOPRAZOLE SODIUM 40 MG PO TBEC
40.0000 mg | DELAYED_RELEASE_TABLET | Freq: Once | ORAL | Status: AC
  Administered 2013-11-08: 40 mg via ORAL
  Filled 2013-11-08: qty 1

## 2013-11-08 NOTE — Assessment & Plan Note (Signed)
Elevated likely due to pain and pt has not taken meds today Will f/u in 2 weeks  Continue HTN meds

## 2013-11-08 NOTE — Patient Instructions (Signed)
General Instructions: Please follow up with [facility]  Come back to clinic or ED if worsening bleeding or pain  Try Tylenol (no more than 3000 mg for pain) Return to clinic in 2 weeks  Pick up medications from the pharmacy    Treatment Goals:  Goals (1 Years of Data) as of 11/08/13         As of Today 09/16/13 08/30/13 08/26/13 08/11/13     Blood Pressure    . Blood Pressure < 140/80  156/71 144/71 146/68 164/53 161/61     Result Component    . HEMOGLOBIN A1C < 7.0   8.2       . LDL CALC < 70            Progress Toward Treatment Goals:  Treatment Goal 08/30/2013  Hemoglobin A1C -  Blood pressure at goal  Stop smoking smoking the same amount    Self Care Goals & Plans:  Self Care Goal 11/08/2013  Manage my medications bring my medications to every visit; take my medicines as prescribed  Monitor my health keep track of my blood pressure  Eat healthy foods -  Be physically active find an activity I enjoy  Stop smoking -  Meeting treatment goals maintain the current self-care plan    Home Blood Glucose Monitoring 10/22/2012  Check my blood sugar 6 times a day  When to check my blood sugar before breakfast; before meals; after breakfast; after lunch; after dinner     Care Management & Community Referrals:  Referral 11/08/2013  Referrals made for care management support -  Referrals made to community resources none

## 2013-11-08 NOTE — Assessment & Plan Note (Signed)
cbg 116 today.  Will need f/u for DM 2 11/2013

## 2013-11-08 NOTE — Progress Notes (Signed)
Case discussed with Dr. McLean soon after the resident saw the patient.  We reviewed the resident's history and exam and pertinent patient test results.  I agree with the assessment, diagnosis, and plan of care documented in the resident's note. 

## 2013-11-08 NOTE — Progress Notes (Signed)
   Subjective:    Patient ID: CALII MCMANIGLE, female    DOB: 04-02-1946, 68 y.o.   MRN: [id number]  HPI Comments: 68 y.o PMH CVA, CAD, HTN, DM 2 (HA1C 8.2 08/2013 cbg today 116), HLD  She presents today after ED visit 11/07/13 where FOBT +. Of note pt has 2 charts this one and one listed under Jemina Bunce with MRN [id number]  1.  She went to the ED for epigastric pain that comes and goes but is worsening for the last week. She is no longer taking Nexium 20 mg.  Pain feels like a "gripe" otherwise she cant explain how it feels but it makes her want to ball up into a ball due to pain.  She took GI cocktail in the ED with relief and was also given Protonix 40 mg x 1.  At home she tried a lot of peptobismol which initially helped the epigastric pain but is no longer.  She has not been able to eat due to pain with associated nausea and has only eaten saltine crackers and ginger ale.  She also has had diarrhea for the last 7 days (last episode Sunday prior to today's visit).  She is having 3-4 stools at day watery and black and FOBT was + in the ED.  She has had weight loss for 1 month and loss 19 lbs she thinks (used to weigh 160s today 149 lbs).  2. HTN-BP uncontrolled today 156/71 but she has not taking any BP medications today     FH: denies FH colon cancer HM: never had a colonoscopy      Review of Systems  Constitutional: Positive for appetite change. Negative for fever and chills.  Respiratory: Negative for shortness of breath.   Cardiovascular: Negative for chest pain.  Gastrointestinal: Positive for nausea, abdominal pain, diarrhea and blood in stool. Negative for vomiting.       Objective:   Physical Exam  Nursing note and vitals reviewed. Constitutional: She is oriented to person, place, and time. She appears well-developed and well-nourished. She is cooperative. No distress.  HENT:  Head: Normocephalic and atraumatic.  Mouth/Throat: Oropharynx is clear and moist and mucous  membranes are normal. No oropharyngeal exudate.  Eyes: Conjunctivae are normal. Pupils are equal, round, and reactive to light. Right eye exhibits no discharge. Left eye exhibits no discharge. No scleral icterus.  Cardiovascular: Normal rate, regular rhythm, S1 normal, S2 normal and normal heart sounds.   No murmur heard. No lower ext edema    Pulmonary/Chest: Effort normal and breath sounds normal. No respiratory distress. She has no wheezes.  Abdominal: Soft. Bowel sounds are normal. There is tenderness. There is no rebound and no guarding.    Neurological: She is alert and oriented to person, place, and time. Gait normal.  Skin: Skin is warm, dry and intact. No rash noted. She is not diaphoretic.  Psychiatric: She has a normal mood and affect. Her speech is normal and behavior is normal. Judgment and thought content normal. Cognition and memory are normal.          Assessment & Plan:  F/u in 2 weeks after seeing [facility] 11/18/13

## 2013-11-08 NOTE — Assessment & Plan Note (Addendum)
Etiology could be related to PUD will likely need upper endoscopy and then colonoscopy. She is fobt + on ED exam which could be related to ulcer (r/o H.pylori) vs diverticulosis. Less likely pancreatitis  Pt declines rectal exam today, reviewed ED labs CMETwnl, Hemoglobin 10.2-normcytic, lipase neg.  Pt declined admission from the ED and wanted to f/u outpatient  Can try Tylenol prn for abdominal pain avoid NSAIDS as she'ed previously been taking  scheduled f/u with [facility] 11/18/13  RTC 2 weeks after [facility] f/u or sooner to clinic or ED if sx's worsening  Pt will get Rx filled for Prevacid and Pepcid today

## 2013-11-08 NOTE — Assessment & Plan Note (Signed)
of note not taking Crestor due to affordability will let PCP know

## 2013-11-18 ENCOUNTER — Telehealth: Payer: Self-pay | Admitting: *Deleted

## 2013-11-18 ENCOUNTER — Ambulatory Visit (INDEPENDENT_AMBULATORY_CARE_PROVIDER_SITE_OTHER): Payer: Medicare Other | Admitting: Nurse Practitioner

## 2013-11-18 ENCOUNTER — Encounter: Payer: Self-pay | Admitting: Nurse Practitioner

## 2013-11-18 ENCOUNTER — Encounter: Payer: Medicare Other | Admitting: Internal Medicine

## 2013-11-18 VITALS — BP 136/64 | HR 88 | Ht 65.5 in | Wt 148.0 lb

## 2013-11-18 DIAGNOSIS — R195 Other fecal abnormalities: Secondary | ICD-10-CM

## 2013-11-18 DIAGNOSIS — R1013 Epigastric pain: Secondary | ICD-10-CM | POA: Insufficient documentation

## 2013-11-18 DIAGNOSIS — R634 Abnormal weight loss: Secondary | ICD-10-CM | POA: Insufficient documentation

## 2013-11-18 DIAGNOSIS — D649 Anemia, unspecified: Secondary | ICD-10-CM | POA: Insufficient documentation

## 2013-11-18 MED ORDER — MOVIPREP 100 G PO SOLR: 1.0000 | Freq: Once | ORAL | Status: DC

## 2013-11-18 NOTE — Telephone Encounter (Signed)
Spoke w/Kelly Tamala Julian, CMA at GI.  She has made an appointment for pt to be seen by Margaret Pyle on 11/24/13

## 2013-11-18 NOTE — Patient Instructions (Signed)
You have been scheduled for an endoscopy and colonoscopy with Dr. Henrene Pastor. Please follow the written instructions given to you at your visit today. Please pick up your prep at the pharmacy within the next 1-3 days. If you use inhalers (even only as needed), please bring them with you on the day of your procedure. Your physician has requested that you go to www.startemmi.com and enter the access code given to you at your visit today. This web site gives a general overview about your procedure. However, you should still follow specific instructions given to you by our office regarding your preparation for the procedure.

## 2013-11-18 NOTE — Telephone Encounter (Signed)
  11/18/2013   RE: Stephanie Gross DOB: 04/17/1946 MRN: [id number]   Dear Dr. Aundra Dubin    We have scheduled the above patient for an Upper Endoscopy and Colonoscopy. Our records show that she is on anticoagulation therapy.   Please advise as to how long the patient may come off her therapy of Plavix prior to the procedure, which is scheduled for 12-20-2013.  Please fax back/ or route the completed form to Evette Georges., CMA   Sincerely,    [name]

## 2013-11-18 NOTE — Telephone Encounter (Signed)
Dr. Camillia Herter patient

## 2013-11-18 NOTE — Telephone Encounter (Signed)
I called patient and advised patient per her PCP we need to ask Dr. Angelena Form about holding her Plavix. I advised patient that Dr. Angelena Form received the message and per Dr. Angelena Form patient needs an office visit with him set up because he has not seen her since 2013. I gave patient Cardiology's number and advised of Dr. Camillia Herter response that is below and that she needs to make an appointment within 2 weeks with NP or PA. I advised patient that she will need to have this appointment so we can proceed with the procedure. Patient verbalized understanding and will make the appointment.

## 2013-11-18 NOTE — Telephone Encounter (Signed)
Burnell Blanks, MD at 11/18/2013 2:22 PM    Status: Signed       She has not been seen in our office since April 2013. We cannot comment on her medications without seeing her before the procedure. Can we arrange for her to be seen by one of the office PAs/NPs in the next 2 weeks or we can try to work her into my schedule. Margarito Courser, MD at 11/18/2013 1:40 PM     Status: Signed        Dr. Camillia Herter patient        Carlyle Dolly, CMA at 11/18/2013 10:54 AM     Status: Signed        11/18/2013  RE: Stephanie Gross  DOB: Jan 29, 1946  MRN: [id number]  Dear Dr. Aundra Dubin  We have scheduled the above patient for an Upper Endoscopy and Colonoscopy. Our records show that she is on anticoagulation therapy.  Please advise as to how long the patient may come off her therapy of Plavix prior to the procedure, which is scheduled for 12-20-2013.  Please fax back/ or route the completed form to Evette Georges., CMA  Sincerely,  [name]

## 2013-11-18 NOTE — Progress Notes (Signed)
Agree with initial assessment and plans. EGD and colonoscopy to evaluate pain, anemia, provide neoplasia screening. Also agree with having Plavix addressed by her prescribing physician.

## 2013-11-18 NOTE — Telephone Encounter (Signed)
She has not been seen in our office since April 2013. We cannot comment on her medications without seeing her before the procedure. Can we arrange for her to be seen by one of the office PAs/NPs in the next 2 weeks or we can try to work her into my schedule. Gerald Stabs

## 2013-11-18 NOTE — Progress Notes (Signed)
HPI :  Patient is a 68 year old female, new to this practice, here for evaluation of epigastric pain. Patient was evaluated in the emergency department earlier this month for a 2 week history of severe, nonradiating epigastric pain.  Pain intermittent, would come in waves and she had never experienced this type of pain before.  Her LFTs and lipase were unremarkable. WBC normal. Hemoglobin 10.2, MCV 84 . Of note, her hemoglobin in October 2013 was 13.1. In addition to the pain patient had been having loose stool for which she was taking Pepto-Bismol. Stool turned black on bismuth. In the emergency department she was Hemoccult positive though after discontinuation of Pepto-Bismol stools returned to normal color. Stools have also improved as far as consistency / frequency.  In the emergency department patient was given a GI cocktail and Prevacid. She's been taking daily Prevacid with resolution of epigastric pain and nausea. Patient stopped Nexium a month ago which she had been on for years of GERD. She takes a daily baby ASA and had been taking Aleve as well.   Past Medical History  Diagnosis Date  . CAD (coronary artery disease)     s/p RCA stent 2007. Myoview 2010 normal  . DM (diabetes mellitus)   . HTN (hypertension)   . Hyperlipidemia   . GERD (gastroesophageal reflux disease)   . 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 No pertinent family history. History  Substance Use Topics  . Smoking status: Current Every Day Smoker -- 0.30 packs/day for 30 years    Types: Cigarettes  . Smokeless tobacco: Never Used     Comment: cutting back / 1/2 PPD  . Alcohol Use: No   Current Outpatient Prescriptions  Medication Sig Dispense Refill  . aspirin EC 81 MG tablet Take 81 mg by mouth daily.      . Blood Glucose Monitoring Suppl (AGAMATRIX PRESTO PRO METER) DEVI 1 Device by Does not apply route 4 (four) times daily.  1 Device  0  .  carvedilol (COREG) 25 MG tablet Take 1 tablet (25 mg total) by mouth 2 (two) times daily with a meal.  60 tablet  12  . chlorthalidone (HYGROTON) 25 MG tablet Take 1 tablet (25 mg total) by mouth daily.  30 tablet  11  . clopidogrel (PLAVIX) 75 MG tablet Take 1 tablet (75 mg total) by mouth daily.  90 tablet  4  . famotidine (PEPCID) 20 MG tablet Take 20 mg by mouth 2 (two) times daily.      Marland Kitchen glucose blood (AGAMATRIX PRESTO TEST) test strip Use as instructed  100 each  12  . glucose blood (ONE TOUCH ULTRA TEST) test strip Check blood sugar before and after meals and bedtime 250.00 insulin requiring  150 each  12  . insulin aspart (NOVOLOG FLEXPEN) 100 UNIT/ML FlexPen Inject 1U per carbohydrate and 1 units per 40mg /dL increment blood sugar over 140, 60-99 inject 2 units, 100-140 inject 3 units, 141-180 inject 4 units, 181-220 inject 5 units, 221-260 inject 6 units, 261-300 inject 7 units, 301-340 inject 8 units  15 mL  2  . Insulin Glargine (LANTUS SOLOSTAR) 100 UNIT/ML Solostar Pen Inject 40 Units into the skin at bedtime.  15 mL  2  . isosorbide mononitrate (IMDUR) 60 MG 24 hr tablet Take 1 tablet (60 mg total) by mouth daily.  90 tablet  3  . lansoprazole (PREVACID) 30 MG capsule Take 30 mg  by mouth daily at 12 noon.      Marland Kitchen lisinopril (PRINIVIL,ZESTRIL) 20 MG tablet Take 40 mg by mouth daily.      . rosuvastatin (CRESTOR) 10 MG tablet Take 1 tablet (10 mg total) by mouth at bedtime.  30 tablet  12   No current facility-administered medications for this visit.   Allergies  Allergen Reactions  . Chantix [Varenicline]     SUICIDAL IDEATION  . Codeine Nausea And Vomiting    Review of Systems: All systems reviewed and negative except where noted in HPI.   Physical Exam: BP 136/64  Pulse 88  Ht 5' 5.5" (1.664 m)  Wt 148 lb (67.132 kg)  BMI 24.25 kg/m2 Constitutional: Pleasant,well-developed, black female in no acute distress. HEENT: Normocephalic and atraumatic. Conjunctivae are normal.  No scleral icterus. Neck supple.  Cardiovascular: Normal rate, regular rhythm.  Pulmonary/chest: Effort normal and breath sounds normal. No wheezing, rales or rhonchi. Abdominal: Soft, nondistended, nontender. Bowel sounds active throughout. There are no masses palpable. No hepatomegaly. Rectal: brown, heme negative stool Extremities: no edema Lymphadenopathy: No cervical adenopathy noted. Neurological: Alert and oriented to person place and time. Skin: Skin is warm and dry. No rashes noted. Psychiatric: Normal mood and affect. Behavior is normal.   ASSESSMENT AND PLAN:  #32. 68 year old female with recent 2 week history of severe epigastric pain associated with a 12 pound weight loss though pain not exacerbated by eating. Of note, patient heme-positive in the emergency department and hemoglobin down from 12.2 in October 2013 to 10.3. LFTs normal. Lipase wasn't check.  Need to exclude peptic ulcer disease in the setting of NSAID use. For further evaluation patient will be scheduled for upper endoscopy.The benefits, risks, and potential complications of EGD with possible biopsies were discussed with the patient and she agrees to proceed. For now, continue daily PPI, avoid Aleve.  #2 recent bowel changes. Loose stool accompanied epigastric pain and could have been secondary to a low-grade upper GI bleed, now resolved. Patient has never had a colonoscopy. We discussed pursuing colonoscopy at the time of the upper endoscopy and patient is agreeable.The risks, benefits, and alternatives to colonoscopy with possible biopsy and possible polypectomy were discussed with the patient and she consents to proceed.   Patient is on Plavix. Will contact prescribing provider about holding the medication for endoscopic procedures  3. long-standing GERD, took Nexium for years but stopped it one month ago, so far no GERD symptoms. Emergency department restarted PPI  4. multiple medical problems including, but not  limited to, coronary artery disease, diabetes, history of CVA.

## 2013-11-22 ENCOUNTER — Telehealth: Payer: Self-pay | Admitting: Nurse Practitioner

## 2013-11-22 NOTE — Telephone Encounter (Signed)
I called the patient and advised that she has to drink a prep for the colonoscopy.  She said she just knows she will throw it up . She can't tolerate it.  I explained the Split dose prep with Miralax and Gatorade.  She said she might be able to tolerate that. I told her to drink as much of it as she can. I explained if her colon is full of stool the doctor cannot visually see and she may have to repeat the test.  She understood and I told her I would mail to her the instructions.

## 2013-11-24 ENCOUNTER — Encounter: Payer: Self-pay | Admitting: Internal Medicine

## 2013-11-24 ENCOUNTER — Ambulatory Visit (INDEPENDENT_AMBULATORY_CARE_PROVIDER_SITE_OTHER): Payer: Medicare Other | Admitting: Physician Assistant

## 2013-11-24 ENCOUNTER — Encounter: Payer: Self-pay | Admitting: Physician Assistant

## 2013-11-24 VITALS — BP 140/60 | HR 80 | Ht 65.5 in | Wt 147.0 lb

## 2013-11-24 DIAGNOSIS — I2510 Atherosclerotic heart disease of native coronary artery without angina pectoris: Secondary | ICD-10-CM

## 2013-11-24 DIAGNOSIS — F17200 Nicotine dependence, unspecified, uncomplicated: Secondary | ICD-10-CM

## 2013-11-24 DIAGNOSIS — E785 Hyperlipidemia, unspecified: Secondary | ICD-10-CM

## 2013-11-24 DIAGNOSIS — I10 Essential (primary) hypertension: Secondary | ICD-10-CM

## 2013-11-24 DIAGNOSIS — Z720 Tobacco use: Secondary | ICD-10-CM

## 2013-11-24 DIAGNOSIS — Z8673 Personal history of transient ischemic attack (TIA), and cerebral infarction without residual deficits: Secondary | ICD-10-CM

## 2013-11-24 NOTE — Patient Instructions (Signed)
It is ok to hold Plavix for your procedure.  Check with Dr. Henrene Pastor, but I would expect they would want you to hold it 7 days before.    I would prefer you remain on Aspirin.  I will let Dr. Henrene Pastor know.  Schedule ABI/arterial dopplers.  Follow up with Dr. Lauree Chandler in 6 months.

## 2013-11-24 NOTE — Progress Notes (Signed)
Cardiology Office Note    Date:  11/24/2013   ID:  Stephanie Gross, DOB Oct 20, 1945, MRN [id number]  PCP:  Clinton Gallant, MD  Cardiologist:  Dr. Lauree Chandler      History of Present Illness: Stephanie Gross is a 68 y.o. female with a hx of CAD s/p Taxus DES to RCA in 04/2005, DM , HTN, HL, prior CVA 01/2012, tobacco abuse.  Last seen by Dr. Lauree Chandler in 07/2011.  She recently has lost 15 lbs and has had severe abdominal and epigastric pain. She went to the ED.  Labs indicated anemia with Hgb 10.2.  FOBT was +.  She saw GI and is to have EGD and colo done. She needs to hold Plavix.  She was previously on Plavix alone.  ASA was added at the time of her CVA.  She denies chest pain, dyspnea, orthopnea, PND, edema.  She denies syncope.  She does have bilateral leg fatigue with exertion.    Studies:  - LHC (04/2006):  EF 65%, prox 40%, mid CFX 20%, mid RCA stent ok, mid PDA 90% (small).  >>> med Rx.  - Echo (10/13):  Mild LVH, EF 60-65%, no RWMA., Gr 1 DD, mild AI, PASP 34 mmHg  - Nuclear (02/2009):  No ischemia, EF 70%, normal study  - Carotid US (10/13):  No sig ICA stenosis   Recent Labs/Images: No results found for requested labs within last 365 days.  Dg Foot Complete Right  08/26/2013   CLINICAL DATA:  Right fifth toe pain.  No known injury.  EXAM: RIGHT FOOT COMPLETE - 3+ VIEW  COMPARISON:  None.  FINDINGS: No acute bony or joint abnormality is identified. No notable degenerative change is seen. There is no erosion. Very small plantar calcaneal spur is noted. Soft tissue structures are unremarkable.  IMPRESSION: Negative exam.   Electronically Signed   By: Inge Rise M.D.   On: 08/26/2013 15:39     Wt Readings from Last 3 Encounters:  11/24/13 147 lb (66.679 kg)  11/18/13 148 lb (67.132 kg)  11/08/13 149 lb (67.586 kg)     Past Medical History  Diagnosis Date  . CAD (coronary artery disease)     s/p RCA stent 2007. Myoview 2010 normal  . DM  (diabetes mellitus)   . HTN (hypertension)   . Hyperlipidemia   . GERD (gastroesophageal reflux disease)   . 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 aspirin EC 81 MG tablet Take 81 mg by mouth daily.      . Blood Glucose Monitoring Suppl (AGAMATRIX PRESTO PRO METER) DEVI 1 Device by Does not apply route 4 (four) times daily.  1 Device  0  . carvedilol (COREG) 25 MG tablet Take 1 tablet (25 mg total) by mouth 2 (two) times daily with a meal.  60 tablet  12  . chlorthalidone (HYGROTON) 25 MG tablet Take 1 tablet (25 mg total) by mouth daily.  30 tablet  11  . clopidogrel (PLAVIX) 75 MG tablet Take 1 tablet (75 mg total) by mouth daily.  90 tablet  4  . famotidine (PEPCID) 20 MG tablet Take 20 mg by mouth 2 (two) times daily.      Marland Kitchen glucose blood (AGAMATRIX PRESTO TEST) test strip Use as instructed  100 each  12  . glucose blood (ONE TOUCH ULTRA TEST) test strip Check blood  sugar before and after meals and bedtime 250.00 insulin requiring  150 each  12  . insulin aspart (NOVOLOG FLEXPEN) 100 UNIT/ML FlexPen Inject 1U per carbohydrate and 1 units per $RemoveB'40mg'CkHVSecO$ /dL increment blood sugar over 140, 60-99 inject 2 units, 100-140 inject 3 units, 141-180 inject 4 units, 181-220 inject 5 units, 221-260 inject 6 units, 261-300 inject 7 units, 301-340 inject 8 units  15 mL  2  . Insulin Glargine (LANTUS SOLOSTAR) 100 UNIT/ML Solostar Pen Inject 40 Units into the skin at bedtime.  15 mL  2  . isosorbide mononitrate (IMDUR) 60 MG 24 hr tablet Take 1 tablet (60 mg total) by mouth daily.  90 tablet  3  . lansoprazole (PREVACID) 30 MG capsule Take 30 mg by mouth daily at 12 noon.      Marland Kitchen lisinopril (PRINIVIL,ZESTRIL) 20 MG tablet Take 40 mg by mouth daily.      Marland Kitchen MOVIPREP 100 G SOLR Take 1 kit (200 g total) by mouth once.  1 kit  0  . rosuvastatin (CRESTOR) 10 MG tablet Take 1 tablet (10 mg total) by  mouth at bedtime.  30 tablet  12   No current facility-administered medications for this visit.     Allergies:   Chantix and Codeine   Social History:  The patient  reports that she has been smoking Cigarettes.  She has a 9 pack-year smoking history. She has never used smokeless tobacco. She reports that she does not drink alcohol or use illicit drugs.   Family History:  The patient's family history includes Diabetes in her maternal grandmother, mother, and sister; Hypertension in her mother; Stroke in her mother.   ROS:  Please see the history of present illness.      All other systems reviewed and negative.   PHYSICAL EXAM: VS:  BP 140/60  Pulse 80  Ht 5' 5.5" (1.664 m)  Wt 147 lb (66.679 kg)  BMI 24.08 kg/m2 Well nourished, well developed, in no acute distress HEENT: normal Neck: no JVD Cardiac:  normal S1, S2; RRR; no murmur Lungs:  clear to auscultation bilaterally, no wheezing, rhonchi or rales Abd: soft, nontender, no hepatomegaly Ext: no edema Skin: warm and dry Neuro:  CNs 2-12 intact, no focal abnormalities noted  EKG:  NSR, HR 61, normal axis, no ST changes     ASSESSMENT AND PLAN:  CORONARY ARTERY DISEASE:  No angina.  She may proceed with her EGD and Colonoscopy at acceptable risk.  She may hold her Plavix.  She has a 1st generation drug eluting stent.  She would be at slightly increased risk of very late stent thrombosis without any antiplatelet therapy.  Therefore, I recommend she remain on Aspirin while she is off Plavix.  Continue beta blocker, statin.  Leg Pain:  DP/PT pulses are hard to palpate.  Schedule ABIs.  Hypertension:  Borderline control.  Monitor.  Hyperlipidemia:  Continue statin.  Managed by PCP.  Tobacco Abuse:  She is not ready to quit.  Prior CVA:  Continue ASA, Plavix, statin.  D/c cigs.  Weight loss:  Workup per GI pending.    Disposition:  F/u with Dr. Lauree Chandler 6 mos.    Signed, Versie Starks, MHS 11/24/2013  4:20 PM    [facility] [place], [place], [place]  [address] Phone: [phone]; Fax: [phone]

## 2013-11-25 ENCOUNTER — Ambulatory Visit (INDEPENDENT_AMBULATORY_CARE_PROVIDER_SITE_OTHER): Payer: Medicare Other | Admitting: Internal Medicine

## 2013-11-25 ENCOUNTER — Encounter: Payer: Self-pay | Admitting: Internal Medicine

## 2013-11-25 VITALS — BP 123/63 | HR 67 | Temp 97.2°F | Ht 65.5 in | Wt 148.5 lb

## 2013-11-25 DIAGNOSIS — R1013 Epigastric pain: Secondary | ICD-10-CM

## 2013-11-25 DIAGNOSIS — E1159 Type 2 diabetes mellitus with other circulatory complications: Secondary | ICD-10-CM

## 2013-11-25 NOTE — Telephone Encounter (Signed)
It is ok to hold Plavix for your procedure. Check with Dr. Henrene Pastor, but I would expect they would want you to hold it 7 days before.  I would prefer you remain on Aspirin. I will let Dr. Henrene Pastor know.  Called patient to let her know that it is okay to stay on aspirin. Patient stated that she will hold Plavix seven days prior to procedure.

## 2013-11-25 NOTE — Progress Notes (Signed)
Subjective:    Patient ID: Stephanie Gross, female    DOB: 04-Mar-1946, 68 y.o.   MRN: [id number]  HPI Stephanie Gross is a 68 yo woman pmh as listed below presents for follow up on abdominal pain.  The patient was recently seen and evaluated for ED follow up in regards to epigastric pain and FOBT + stool complicated by weight loss. The patient has followed up with GI whom recommended the patient avoid NSAIDs and continue with PPI and then pursue both EGD and colonoscopy this month. The patient reports that she is compliant with the medication daily and has thus had complete resolution of her symptoms. She denied any more epigastric pain, dark tarry stools, frank BRBPR, or dizziness.   Given her significant CAD the pt was also evaluated by cardiology for clearance and was recommended to proceed with holding her plavix 7 days before her procedure but continuing ASA daily. The pt has not had any CP, SOB, dizziness, or DOE.   She does report financial and social stressors including her husband undergoing a recent operation which has left him unable to work and the patient has been picking up extra shifts at work and over exerting herself along with decreased appetite and feels some of this may have contributed to her weight loss.   Past Medical History  Diagnosis Date  . CAD (coronary artery disease)     s/p RCA stent 2007. Myoview 2010 normal  . DM (diabetes mellitus)   . HTN (hypertension)   . Hyperlipidemia   . GERD (gastroesophageal reflux disease)   . 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 aspirin EC 81 MG tablet Take 81 mg by mouth daily.      . Blood Glucose Monitoring Suppl (AGAMATRIX PRESTO PRO METER) DEVI 1 Device by Does not apply route 4 (four) times daily.  1 Device  0  . carvedilol (COREG) 25 MG tablet Take 1 tablet (25 mg total)  by mouth 2 (two) times daily with a meal.  60 tablet  12  . chlorthalidone (HYGROTON) 25 MG tablet Take 1 tablet (25 mg total) by mouth daily.  30 tablet  11  . clopidogrel (PLAVIX) 75 MG tablet Take 1 tablet (75 mg total) by mouth daily.  90 tablet  4  . famotidine (PEPCID) 20 MG tablet Take 20 mg by mouth 2 (two) times daily.      Marland Kitchen glucose blood (AGAMATRIX PRESTO TEST) test strip Use as instructed  100 each  12  . glucose blood (ONE TOUCH ULTRA TEST) test strip Check blood sugar before and after meals and bedtime 250.00 insulin requiring  150 each  12  . insulin aspart (NOVOLOG FLEXPEN) 100 UNIT/ML FlexPen Inject 1U per carbohydrate and 1 units per $RemoveB'40mg'ibHBJndg$ /dL increment blood sugar over 140, 60-99 inject 2 units, 100-140 inject 3 units, 141-180 inject 4 units, 181-220 inject 5 units, 221-260 inject 6 units, 261-300 inject 7 units, 301-340 inject 8 units  15 mL  2  . Insulin Glargine (LANTUS SOLOSTAR) 100 UNIT/ML Solostar Pen Inject 40 Units into the skin at bedtime.  15 mL  2  . isosorbide mononitrate (IMDUR) 60 MG 24 hr tablet Take 1 tablet (60 mg total) by mouth daily.  90 tablet  3  . lansoprazole (PREVACID) 30 MG capsule Take 30 mg by mouth daily at 12 noon.      Marland Kitchen  lisinopril (PRINIVIL,ZESTRIL) 20 MG tablet Take 40 mg by mouth daily.      Marland Kitchen MOVIPREP 100 G SOLR Take 1 kit (200 g total) by mouth once.  1 kit  0  . rosuvastatin (CRESTOR) 10 MG tablet Take 1 tablet (10 mg total) by mouth at bedtime.  30 tablet  12   No current facility-administered medications on file prior to visit.   Social, surgical, family history reviewed with patient and updated in appropriate chart locations.   Review of Systems Complete 14 pt ROS was performed with pertinent listed in HPI.     Objective:   Physical Exam Filed Vitals:   11/25/13 1413  BP: 123/63  Pulse: 67  Temp: 97.2 F (36.2 C)   General: sitting in chair, NAD  HEENT: PERRL, EOMI, no scleral icterus Cardiac: RRR, no rubs, murmurs or  gallops Pulm: clear to auscultation bilaterally, moving normal volumes of air Abd: soft, nontender, nondistended, BS present Ext: warm and well perfused, no pedal edema Neuro: alert and oriented X3, cranial nerves II-XII grossly intact    Assessment & Plan:  Please see problem oriented charting  Pt discussed with Dr. Marinda Elk

## 2013-11-27 NOTE — Assessment & Plan Note (Signed)
Samples of this drug were given to the patient, quantity 2, of novolog pen and levemir instead of lantus given current financial constraints.

## 2013-11-27 NOTE — Assessment & Plan Note (Signed)
Pt has resolution of symptoms taking PPI. Pt has had pretty stable weight recently but that has been after about a 10 lb weight loss in 9/14 where pt was 150 lbs. No red flag symptoms during today's visit to warrant need for imaging. Pt already has GI follow up scheduled for outpt EGD and colonoscopy.  Wt Readings from Last 3 Encounters:  11/25/13 148 lb 8 oz (67.359 kg)  11/24/13 147 lb (66.679 kg)  11/18/13 148 lb (67.132 kg)  - continue ppi therapy -pt educated on red flag symptoms

## 2013-11-28 NOTE — Progress Notes (Signed)
Case discussed with Dr. Sadek soon after the resident saw the patient.  We reviewed the resident's history and exam and pertinent patient test results.  I agree with the assessment, diagnosis, and plan of care documented in the resident's note. 

## 2013-11-29 LAB — GLUCOSE, CAPILLARY: Glucose-Capillary: 97 mg/dL (ref 70–99)

## 2013-11-30 ENCOUNTER — Encounter: Payer: Self-pay | Admitting: Internal Medicine

## 2013-12-20 ENCOUNTER — Ambulatory Visit (AMBULATORY_SURGERY_CENTER): Payer: Medicare Other | Admitting: Internal Medicine

## 2013-12-20 ENCOUNTER — Encounter: Payer: Self-pay | Admitting: Internal Medicine

## 2013-12-20 VITALS — BP 171/78 | HR 65 | Temp 99.1°F | Resp 19 | Ht 65.0 in | Wt 148.0 lb

## 2013-12-20 DIAGNOSIS — R1013 Epigastric pain: Secondary | ICD-10-CM

## 2013-12-20 DIAGNOSIS — R634 Abnormal weight loss: Secondary | ICD-10-CM

## 2013-12-20 DIAGNOSIS — K253 Acute gastric ulcer without hemorrhage or perforation: Secondary | ICD-10-CM

## 2013-12-20 DIAGNOSIS — K2980 Duodenitis without bleeding: Secondary | ICD-10-CM

## 2013-12-20 DIAGNOSIS — R195 Other fecal abnormalities: Secondary | ICD-10-CM

## 2013-12-20 LAB — GLUCOSE, CAPILLARY
GLUCOSE-CAPILLARY: 110 mg/dL — AB (ref 70–99)
Glucose-Capillary: 82 mg/dL (ref 70–99)
Glucose-Capillary: 78 mg/dL (ref 70–99)

## 2013-12-20 MED ORDER — SODIUM CHLORIDE 0.9 % IV SOLN: 500.0000 mL | INTRAVENOUS | Status: DC

## 2013-12-20 MED ORDER — OMEPRAZOLE 40 MG PO CPDR: 40.0000 mg | DELAYED_RELEASE_CAPSULE | Freq: Every day | ORAL | Status: DC

## 2013-12-20 NOTE — Op Note (Signed)
[place]  [address]. [place], [id number]   ENDOSCOPY PROCEDURE REPORT  PATIENT: Merissa, Funkhouser  MR#: [id number] BIRTHDATE: [birth year] , 89  yrs. old GENDER: Female ENDOSCOPIST: Eustace Quail, MD REFERRED BY:  Fabienne Bruns [facility] PROCEDURE DATE:  12/20/2013 PROCEDURE:  EGD w/ biopsy ASA CLASS:     Class III INDICATIONS:  Epigastric pain.   Heme positive stool.   Anemia. MEDICATIONS: MAC sedation, administered by CRNA and propofol (Diprivan) 50mg  IV TOPICAL ANESTHETIC: none DESCRIPTION OF PROCEDURE: After the risks benefits and alternatives of the procedure were thoroughly explained, informed consent was obtained.  The [id number] endoscope was introduced through the mouth and advanced to the second portion of the duodenum. Without limitations.  The instrument was slowly withdrawn as the mucosa was fully examined.    EXAM:The esophagus revealed mild esophagitis as manifested by mild edema at the Z line.  The stomach revealed a 5 mm ulcer on the incisura as well as multiple superficial antral erosions.  Mild erosive duodenitis was present in the bulb.  The post bulbar duodenum was normal.  CLO biopsy taken.  Retroflexed views revealed no abnormalities.     The scope was then withdrawn from the patient and the procedure completed.  COMPLICATIONS: There were no complications. ENDOSCOPIC IMPRESSION: 1. Mild esophagitis 2. Gastric ulcer and gastric erosions 3. Mild erosive duodenitis  RECOMMENDATIONS: 1.  Prescribe omeprazole 40 mg; #30; one by mouth daily 2.  Avoid NSAIDS , other than your baby aspirin daily 3. Treat for Helicobacter pylori if CLO biopsy positive 4. Resume Plavix today 5. Return to the care of your primary provider  REPEAT EXAM:  eSigned:  Eustace Quail, MD 12/20/2013 2:46 PM   CC:The Patient  ; Clinton Gallant , MD [facility])

## 2013-12-20 NOTE — Op Note (Signed)
[place]  [address]. [place], [address]   COLONOSCOPY PROCEDURE REPORT  PATIENT: Elliot, Murrey  MR#: [id number] BIRTHDATE: 1946-04-08 , 61  yrs. old GENDER: Female ENDOSCOPIST: Eustace Quail, MD REFERRED BY:ER PROCEDURE DATE:  12/20/2013 PROCEDURE:   Colonoscopy, diagnostic First Screening Colonoscopy - Avg.  risk and is 50 yrs.  old or older - No.  Prior Negative Screening - Now for repeat screening. N/A  History of Adenoma - Now for follow-up colonoscopy & has been > or = to 3 yrs.  N/A  Polyps Removed Today? No.  Recommend repeat exam, <10 yrs? No. ASA CLASS:   Class III INDICATIONS:heme-positive stool. MEDICATIONS: MAC sedation, administered by CRNA and propofol (Diprivan) 230mg  IV  DESCRIPTION OF PROCEDURE:   After the risks benefits and alternatives of the procedure were thoroughly explained, informed consent was obtained.  A digital rectal exam revealed no abnormalities of the rectum.   The LB [id number]  endoscope was introduced through the anus and advanced to the cecum, which was identified by both the appendix and ileocecal valve. No adverse events experienced.   The quality of the prep was excellent, using MiraLax  The instrument was then slowly withdrawn as the colon was fully examined.  COLON FINDINGS: Moderate diverticulosis was noted in the sigmoid colon.   The colon was otherwise normal.  There was no inflammation, polyps or cancers unless previously stated. Retroflexed views revealed no abnormalities. The time to cecum=2 minutes 37 seconds.  Withdrawal time=11 minutes 15 seconds.  The scope was withdrawn and the procedure completed. COMPLICATIONS: There were no complications.  ENDOSCOPIC IMPRESSION: 1.   Moderate diverticulosis was noted in the sigmoid colon 2.   The colon was otherwise normal  RECOMMENDATIONS: 1.  Continue current colorectal screening recommendations for "routine risk" patients with a repeat  colonoscopy in 10 years. 2.  Upper endoscopy today (please see report)   eSigned:  Eustace Quail, MD 12/20/2013 2:38 PM   cc: The Patient    ; Clinton Gallant, MD [facility] Internal Medicine)

## 2013-12-20 NOTE — Patient Instructions (Signed)
YOU HAD AN ENDOSCOPIC PROCEDURE TODAY AT THE [facility]: Refer to the procedure report that was given to you for any specific questions about what was found during the examination.  If the procedure report does not answer your questions, please call your gastroenterologist to clarify.  If you requested that your care partner not be given the details of your procedure findings, then the procedure report has been included in a sealed envelope for you to review at your convenience later.  YOU SHOULD EXPECT: Some feelings of bloating in the abdomen. Passage of more gas than usual.  Walking can help get rid of the air that was put into your GI tract during the procedure and reduce the bloating. If you had a lower endoscopy (such as a colonoscopy or flexible sigmoidoscopy) you may notice spotting of blood in your stool or on the toilet paper. If you underwent a bowel prep for your procedure, then you may not have a normal bowel movement for a few days.  DIET: Your first meal following the procedure should be a light meal and then it is ok to progress to your normal diet.  A half-sandwich or bowl of soup is an example of a good first meal.  Heavy or fried foods are harder to digest and may make you feel nauseous or bloated.  Likewise meals heavy in dairy and vegetables can cause extra gas to form and this can also increase the bloating.  Drink plenty of fluids but you should avoid alcoholic beverages for 24 hours.  ACTIVITY: Your care partner should take you home directly after the procedure.  You should plan to take it easy, moving slowly for the rest of the day.  You can resume normal activity the day after the procedure however you should NOT DRIVE or use heavy machinery for 24 hours (because of the sedation medicines used during the test).    SYMPTOMS TO REPORT IMMEDIATELY: A gastroenterologist can be reached at any hour.  During normal business hours, 8:30 AM to 5:00 PM Monday through Friday,  call [phone].  After hours and on weekends, please call the GI answering service at [phone] who will take a message and have the physician on call contact you.   Following lower endoscopy (colonoscopy or flexible sigmoidoscopy):  Excessive amounts of blood in the stool  Significant tenderness or worsening of abdominal pains  Swelling of the abdomen that is new, acute  Fever of 100F or higher  Following upper endoscopy (EGD)  Vomiting of blood or coffee ground material  New chest pain or pain under the shoulder blades  Painful or persistently difficult swallowing  New shortness of breath  Fever of 100F or higher  Black, tarry-looking stools  FOLLOW UP: If any biopsies were taken you will be contacted by phone or by letter within the next 1-3 weeks.  Call your gastroenterologist if you have not heard about the biopsies in 3 weeks.  Our staff will call the home number listed on your records the next business day following your procedure to check on you and address any questions or concerns that you may have at that time regarding the information given to you following your procedure. This is a courtesy call and so if there is no answer at the home number and we have not heard from you through the emergency physician on call, we will assume that you have returned to your regular daily activities without incident.  SIGNATURES/CONFIDENTIALITY: You and/or your care   partner have signed paperwork which will be entered into your electronic medical record.  These signatures attest to the fact that that the information above on your After Visit Summary has been reviewed and is understood.  Full responsibility of the confidentiality of this discharge information lies with you and/or your care-partner.  Resume your plavix today. Take your Omeprazole 40mg  every day to prevent a large ulcer in the future. Do NOT TAKE NSAIDS such as, aleve and advil.  They may cause ulcers. We will call you  if the test for H-Pylori is positive.

## 2013-12-20 NOTE — Progress Notes (Signed)
A/ox3 pleased with MAC, report to Suzanne RN 

## 2013-12-20 NOTE — Progress Notes (Signed)
Called to room to assist during endoscopic procedure.  Patient ID and intended procedure confirmed with present staff. Received instructions for my participation in the procedure from the performing physician.  

## 2013-12-21 ENCOUNTER — Telehealth: Payer: Self-pay | Admitting: *Deleted

## 2013-12-21 LAB — HELICOBACTER PYLORI SCREEN-BIOPSY: UREASE: POSITIVE — AB

## 2013-12-21 NOTE — Telephone Encounter (Signed)
  Follow up Call-  Call back number 12/20/2013  Post procedure Call Back phone  # [phone]  Permission to leave phone message Yes     Patient questions:  Do you have a fever, pain , or abdominal swelling? No. Pain Score  0 *  Have you tolerated food without any problems? Yes.    Have you been able to return to your normal activities? Yes.    Do you have any questions about your discharge instructions: Diet   No. Medications  No. Follow up visit  No.  Do you have questions or concerns about your Care? No.  Actions: * If pain score is 4 or above: No action needed, pain <4.

## 2013-12-22 ENCOUNTER — Other Ambulatory Visit: Payer: Self-pay

## 2013-12-22 MED ORDER — AMOXICILL-CLARITHRO-LANSOPRAZ PO MISC: Freq: Two times a day (BID) | ORAL | Status: DC

## 2013-12-23 ENCOUNTER — Other Ambulatory Visit: Payer: Self-pay | Admitting: *Deleted

## 2013-12-23 DIAGNOSIS — E1159 Type 2 diabetes mellitus with other circulatory complications: Secondary | ICD-10-CM

## 2013-12-23 MED ORDER — INSULIN GLARGINE 100 UNIT/ML SOLOSTAR PEN: 40.0000 [IU] | PEN_INJECTOR | Freq: Every day | SUBCUTANEOUS | Status: DC

## 2013-12-23 MED ORDER — INSULIN ASPART 100 UNIT/ML FLEXPEN: PEN_INJECTOR | SUBCUTANEOUS | Status: DC

## 2013-12-23 NOTE — Telephone Encounter (Signed)
Pt is out of meds, please refill 

## 2013-12-24 ENCOUNTER — Telehealth: Payer: Self-pay | Admitting: Family Medicine

## 2013-12-24 ENCOUNTER — Other Ambulatory Visit: Payer: Self-pay | Admitting: Internal Medicine

## 2013-12-24 DIAGNOSIS — E1159 Type 2 diabetes mellitus with other circulatory complications: Secondary | ICD-10-CM

## 2013-12-24 MED ORDER — INSULIN GLARGINE 100 UNIT/ML SOLOSTAR PEN: 40.0000 [IU] | PEN_INJECTOR | Freq: Every day | SUBCUTANEOUS | Status: DC

## 2013-12-24 NOTE — Telephone Encounter (Signed)
Received page from operator to call patient. Spoke with patient and she is an Internal Medicine patient. I advised the patient to call the operator back and ask to speak to the internal medicine resident on call.

## 2013-12-26 ENCOUNTER — Telehealth: Payer: Self-pay | Admitting: *Deleted

## 2013-12-26 NOTE — Telephone Encounter (Signed)
Spoke with patient unable to afford Lantus Solostar at this time.  Would like to see if she can get something else instead.  Sander Nephew, RN 12/26/2013 2:40 PM

## 2013-12-30 ENCOUNTER — Other Ambulatory Visit: Payer: Self-pay | Admitting: Internal Medicine

## 2013-12-30 MED ORDER — INSULIN DETEMIR 100 UNIT/ML ~~LOC~~ SOLN: 40.0000 [IU] | Freq: Every day | SUBCUTANEOUS | Status: DC

## 2013-12-31 ENCOUNTER — Encounter (HOSPITAL_COMMUNITY): Payer: Self-pay | Admitting: Emergency Medicine

## 2013-12-31 ENCOUNTER — Emergency Department (INDEPENDENT_AMBULATORY_CARE_PROVIDER_SITE_OTHER)
Admission: EM | Admit: 2013-12-31 | Discharge: 2013-12-31 | Disposition: A | Discharge status: Home / Self Care | Payer: Worker's Compensation | Source: Home / Self Care | Attending: Family Medicine | Admitting: Family Medicine

## 2013-12-31 DIAGNOSIS — M545 Low back pain, unspecified: Secondary | ICD-10-CM

## 2013-12-31 DIAGNOSIS — X500XXA Overexertion from strenuous movement or load, initial encounter: Secondary | ICD-10-CM

## 2013-12-31 MED ORDER — KETOROLAC TROMETHAMINE 30 MG/ML IJ SOLN
INTRAMUSCULAR | Status: AC
  Filled 2013-12-31: qty 1

## 2013-12-31 MED ORDER — KETOROLAC TROMETHAMINE 30 MG/ML IJ SOLN
30.0000 mg | Freq: Once | INTRAMUSCULAR | Status: AC
  Administered 2013-12-31: 30 mg via INTRAMUSCULAR

## 2013-12-31 MED ORDER — CYCLOBENZAPRINE HCL 5 MG PO TABS: 5.0000 mg | ORAL_TABLET | Freq: Three times a day (TID) | ORAL | Status: DC | PRN

## 2013-12-31 NOTE — ED Notes (Signed)
Pt  Reports  She  Has  Back  Pain  That is  Worse  When  She  Gets  Up   As    Well   As  Sitting     She  denys  A  specefic   Traumatic  Event  But  Reports     It is  Worse    After    Stocking items       And  Lifting

## 2013-12-31 NOTE — ED Provider Notes (Signed)
CSN: [id number]     Arrival date & time 12/31/13  1415 History   First MD Initiated Contact with Patient 12/31/13 1425     Chief Complaint  Patient presents with  . Back Pain   (Consider location/radiation/quality/duration/timing/severity/associated sxs/prior Treatment) Patient is a 68 y.o. female presenting with back pain. The history is provided by the patient.  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 Relieved by:  Bed rest Associated symptoms: no abdominal pain, no dysuria, no fever, no numbness, no paresthesias, no perianal numbness and no weakness     Past Medical History  Diagnosis Date  . Hypertension   . Diabetes mellitus without complication   . CAD (coronary artery disease)     s/p RCA stent 2007. Myoview 2010 normal  . DM (diabetes mellitus)   . HTN (hypertension)   . Hyperlipidemia   . GERD (gastroesophageal reflux disease)   . 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 Cholecystectomy    . Coronary angioplasty with stent placement    . Abdominal hysterectomy    . Shoulder surgery    . Knee surgery     Family History  Problem Relation Age of Onset  . Stroke Mother   . Diabetes Mother   . Diabetes Maternal Grandmother   . Diabetes Sister   . Hypertension Mother    History  Substance Use Topics  . Smoking status: Current Every Day Smoker -- 0.30 packs/day for 30 years    Types: Cigarettes  . Smokeless tobacco: Never Used     Comment: cutting back / 1/2 PPD  . Alcohol Use: No   OB History   Grav Para Term Preterm Abortions TAB SAB Ect Mult Living                 Review of Systems   Constitutional: Negative.  Negative for fever.  Gastrointestinal: Negative.  Negative for abdominal pain.  Genitourinary: Negative.  Negative for dysuria.  Musculoskeletal: Positive for back pain and gait problem. Negative for joint swelling.  Skin: Negative.   Neurological: Negative for weakness, numbness and paresthesias.    Allergies  Chantix; Codeine; and Statins  Home Medications   Prior to Admission medications   Medication Sig Start Date End Date Taking? Authorizing Provider  amoxicillin-clarithromycin-lansoprazole [facility]) combo pack Take by mouth 2 (two) times daily. Follow package directions. 12/22/13   Irene Shipper, MD  aspirin 81 MG tablet Take 81 mg by mouth daily.    Historical Provider, MD  aspirin EC 81 MG tablet Take 81 mg by mouth daily.    Historical Provider, MD  Blood Glucose Monitoring Suppl (AGAMATRIX PRESTO PRO METER) DEVI 1 Device by Does not apply route 4 (four) times daily. 07/09/12   Tonia Brooms, MD  carvedilol (COREG) 25 MG tablet Take 1 tablet (25 mg total) by mouth 2 (two) times daily with a meal. 09/16/13   Clinton Gallant, MD  carvedilol (COREG) 25 MG tablet Take 25 mg by mouth 2 (two) times daily with a meal.    Historical Provider, MD  chlorthalidone (HYGROTON) 25 MG tablet Take 1 tablet (25 mg total) by mouth daily.  09/16/13   Clinton Gallant, MD  chlorthalidone (HYGROTON) 25 MG tablet Take 25 mg by mouth daily.    Historical Provider, MD  clopidogrel (PLAVIX) 75 MG tablet Take 1 tablet (75 mg total) by mouth daily. 03/07/13   Clinton Gallant, MD  clopidogrel (PLAVIX) 75 MG tablet Take 75 mg by mouth daily.    Historical Provider, MD  cyclobenzaprine (FLEXERIL) 5 MG tablet Take 1 tablet (5 mg total) by mouth 3 (three) times daily as needed for muscle spasms. 12/31/13   Billy Fischer, MD  famotidine (PEPCID) 20 MG tablet Take 20 mg by mouth 2 (two) times daily.    Historical Provider, MD  famotidine (PEPCID) 20 MG tablet Take 1 tablet (20 mg total) by mouth 2 (two)  times daily. 11/08/13   Johnna Acosta, MD  glucose blood (AGAMATRIX PRESTO TEST) test strip Use as instructed 07/09/12   Tonia Brooms, MD  glucose blood (ONE TOUCH ULTRA TEST) test strip Check blood sugar before and after meals and bedtime 250.00 insulin requiring 10/28/12   Bartholomew Crews, MD  insulin aspart (NOVOLOG FLEXPEN) 100 UNIT/ML FlexPen Inject 1U per carbohydrate and 1 units per 40mg /dL increment blood sugar over 140, 60-99 inject 2 units, 100-140 inject 3 units, 141-180 inject 4 units, 181-220 inject 5 units, 221-260 inject 6 units, 261-300 inject 7 units, 301-340 inject 8 units 12/23/13   Nischal Narendra, MD  insulin detemir (LEVEMIR) 100 UNIT/ML injection Inject 0.4 mLs (40 Units total) into the skin at bedtime. 12/30/13   Clinton Gallant, MD  isosorbide mononitrate (IMDUR) 60 MG 24 hr tablet Take 1 tablet (60 mg total) by mouth daily. 02/25/13   Clinton Gallant, MD  isosorbide mononitrate (IMDUR) 60 MG 24 hr tablet Take 60 mg by mouth daily.    Historical Provider, MD  lansoprazole (PREVACID) 30 MG capsule Take 30 mg by mouth daily at 12 noon.    Historical Provider, MD  lansoprazole (PREVACID) 30 MG capsule Take 1 capsule (30 mg total) by mouth daily at 12 noon. 11/08/13   Johnna Acosta, MD  lisinopril (PRINIVIL,ZESTRIL) 20 MG tablet Take 40 mg by mouth daily. 02/24/13   Karren Cobble, MD  lisinopril (PRINIVIL,ZESTRIL) 20 MG tablet Take 20 mg by mouth daily.    Historical Provider, MD  omeprazole (PRILOSEC) 40 MG capsule Take 1 capsule (40 mg total) by mouth daily. 12/20/13   Irene Shipper, MD  rosuvastatin (CRESTOR) 10 MG tablet Take 1 tablet (10 mg total) by mouth at bedtime. 09/16/13 09/16/14  Clinton Gallant, MD  rosuvastatin (CRESTOR) 10 MG tablet Take 10 mg by mouth daily.    Historical Provider, MD   BP 194/71  Pulse 78  Temp(Src) 98.6 F (37 C) (Oral)  SpO2 96% Physical Exam  Nursing note and vitals reviewed. Constitutional: She is oriented to person, place, and time. She appears  well-developed and well-nourished.  Abdominal: Soft. Bowel sounds are normal. There is no tenderness.  Neurological: She is alert and oriented to person, place, and time.  Skin: Skin is warm and dry.    ED Course  Procedures (including critical care time) Labs Review Labs Reviewed - No data to display  Imaging Review No results found.   MDM   1. Bilateral low back pain without sciatica        Billy Fischer, MD 12/31/13 1501

## 2014-02-02 ENCOUNTER — Telehealth: Payer: Self-pay | Admitting: Dietician

## 2014-02-02 NOTE — Telephone Encounter (Signed)
Called patient to discuss calling her insurance company to see how long it will take her to pay down her deductible: she said she had already spoken to them- her deductible is 250.00 and her lantus will be 45$ once she pays off her deductible.  She cannot use the lantus or levemir copay card with medicare or MAP because she is not in the doughnut hole. We discussed [facility] insulin as an option. She plans to begin using 20 units of the levemir a day until she sees Dr. Algis Liming on 02-10-14.  Spoke with our social work who suggested she contact [place]. Mailed patient information on how to contact [place] who can help her pick the best Medicare D plan for her needs.

## 2014-02-02 NOTE — Telephone Encounter (Addendum)
Asked by Dr. Algis Liming to assist with patient inability to afford insulin: called patient and she told me that she wanted to let us know she is not taking any lantus or levemir, only taking Novolog/humalog 1-2 times a day.  Cannot afford lantus:  166 for Lantus, 300 for the other, we gave her levemir sample but she is afraid of that since she doesn't know anything about it. Thinks her insulin is expensive because she Has not paid her deductible down yet this year.  Upset because she didn't hear back about the results of her GI evaluation, didn't understand why she was called and told to take medicine by GI.  Stomach Feels a lot better, not a big eater, no appetite, forces herself to eat,afraid its going to come back up.  drinks liquids with sugar to maintain her blood sugars and without if blood sugar is high. Nibbles twice a day- makes sure she eats peanut butter crackers twice a day.  Dizzy in am when she awakens, and then after that passes she is fine the rest of the day. Blood sugar doing fine-  139-140- 142 fasting, 140 about the highest lately.  She is due for an appointment and A1C, She agreed to an appointment- transferred her call to the front office to schedule an appointment

## 2014-02-02 NOTE — Telephone Encounter (Signed)
Asked by Dr. Algis Liming to assist with patient inabiilty to afford insulin.

## 2014-02-10 ENCOUNTER — Encounter: Payer: Self-pay | Admitting: Internal Medicine

## 2014-03-24 ENCOUNTER — Other Ambulatory Visit: Payer: Self-pay | Admitting: Internal Medicine

## 2014-03-29 ENCOUNTER — Other Ambulatory Visit: Payer: Self-pay | Admitting: Internal Medicine

## 2014-04-14 ENCOUNTER — Ambulatory Visit (INDEPENDENT_AMBULATORY_CARE_PROVIDER_SITE_OTHER): Payer: Medicare Other | Admitting: Internal Medicine

## 2014-04-14 ENCOUNTER — Ambulatory Visit (HOSPITAL_COMMUNITY)
Admission: RE | Admit: 2014-04-14 | Discharge: 2014-04-14 | Disposition: A | Discharge status: Home / Self Care | Payer: Medicare Other | Source: Ambulatory Visit | Attending: Family Medicine | Admitting: Family Medicine

## 2014-04-14 ENCOUNTER — Encounter: Payer: Self-pay | Admitting: Internal Medicine

## 2014-04-14 VITALS — BP 203/79 | HR 92 | Temp 98.2°F | Ht 65.5 in | Wt 150.5 lb

## 2014-04-14 DIAGNOSIS — E1310 Other specified diabetes mellitus with ketoacidosis without coma: Secondary | ICD-10-CM

## 2014-04-14 DIAGNOSIS — I10 Essential (primary) hypertension: Secondary | ICD-10-CM

## 2014-04-14 DIAGNOSIS — E1110 Type 2 diabetes mellitus with ketoacidosis without coma: Secondary | ICD-10-CM

## 2014-04-14 DIAGNOSIS — Z794 Long term (current) use of insulin: Secondary | ICD-10-CM

## 2014-04-14 LAB — POCT GLYCOSYLATED HEMOGLOBIN (HGB A1C): HEMOGLOBIN A1C: 7

## 2014-04-14 LAB — GLUCOSE, CAPILLARY: Glucose-Capillary: 142 mg/dL — ABNORMAL HIGH (ref 70–99)

## 2014-04-14 NOTE — Assessment & Plan Note (Signed)
Lab Results  Component Value Date   HGBA1C 7.0 04/14/2014   HGBA1C 8.2 09/16/2013   HGBA1C 7.1 01/14/2013     Assessment: Diabetes control:  despite not having insulin having relatively good control  Progress toward A1C goal:    Comments:   Plan: Medications:  Free samples of lantus and novolog were provided to the patient to continue lantus 40 units qhs  Home glucose monitoring: Frequency:   Timing:   Instruction/counseling given: reminded to bring blood glucose meter & log to each visit, reminded to bring medications to each visit, discussed foot care and discussed diet Educational resources provided: brochure Self management tools provided:   Other plans: discussed tobacco cessation

## 2014-04-14 NOTE — Progress Notes (Signed)
Subjective:   Patient ID: MELENIE TORIO female   DOB: 12-Feb-1946 68 y.o.   MRN: [id number]  HPI: Ms.Debbie D Vanriper is a 68 y.o. woman pmh as listed below presents for DM and HTN recheck.   The patient has had continued problems getting insulin and therefore states that she is essentially been without any treatment for her diabetes for the past year. Despite this she denies any polyuria or polydipsia. She does state that she has been compliant with her antihypertensive medications. She does continue to smoke down to about a half pack per day. She denies any chest pain, short of breath, dyspnea on exertion, headaches, blurry vision, or lower extremity edema.   Past Medical History  Diagnosis Date  . Hypertension   . Diabetes mellitus without complication   . CAD (coronary artery disease)     s/p RCA stent 2007. Myoview 2010 normal  . DM (diabetes mellitus)   . HTN (hypertension)   . Hyperlipidemia   . GERD (gastroesophageal reflux disease)   . 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 aspirin EC 81 MG tablet Take 81 mg by mouth daily.    . Blood Glucose Monitoring Suppl (AGAMATRIX PRESTO PRO METER) DEVI 1 Device by Does not apply route 4 (four) times daily. 1 Device 0  . carvedilol (COREG) 25 MG tablet Take 25 mg by mouth 2 (two) times daily with a meal.    . chlorthalidone (HYGROTON) 25 MG tablet Take 25 mg by mouth daily.    . clopidogrel (PLAVIX) 75 MG tablet TAKE ONE TABLET BY MOUTH ONCE DAILY 90 tablet 0  . cyclobenzaprine (FLEXERIL) 5 MG tablet Take 1 tablet (5 mg total) by mouth 3 (three) times daily as needed for muscle spasms. 30 tablet 0  . famotidine (PEPCID) 20 MG tablet Take 1 tablet (20 mg total) by mouth 2 (two) times daily. 30 tablet 0  . glucose blood (AGAMATRIX PRESTO TEST) test strip Use as instructed 100 each 12  . glucose blood (ONE  TOUCH ULTRA TEST) test strip Check blood sugar before and after meals and bedtime 250.00 insulin requiring 150 each 12  . insulin aspart (NOVOLOG FLEXPEN) 100 UNIT/ML FlexPen Inject 1U per carbohydrate and 1 units per 40mg /dL increment blood sugar over 140, 60-99 inject 2 units, 100-140 inject 3 units, 141-180 inject 4 units, 181-220 inject 5 units, 221-260 inject 6 units, 261-300 inject 7 units, 301-340 inject 8 units 15 mL 2  . insulin detemir (LEVEMIR) 100 UNIT/ML injection Inject 0.4 mLs (40 Units total) into the skin at bedtime. 10 mL 11  . isosorbide mononitrate (IMDUR) 60 MG 24 hr tablet Take 60 mg by mouth daily.    . lansoprazole (PREVACID) 30 MG capsule Take 1 capsule (30 mg total) by mouth daily at 12 noon. 30 capsule 1  . lisinopril (PRINIVIL,ZESTRIL) 20 MG tablet TAKE ONE TABLET BY MOUTH TWICE DAILY 180 tablet 0  . omeprazole (PRILOSEC) 40 MG capsule Take 1 capsule (40 mg total) by mouth daily. 30 capsule 2  . rosuvastatin (CRESTOR) 10 MG tablet Take 10 mg by mouth daily.     No current facility-administered medications for this visit.   Family History  Problem Relation Age of Onset  . Stroke Mother   . Diabetes Mother   . Diabetes Maternal Grandmother   . Diabetes Sister   . Hypertension Mother  History   Social History  . Marital Status: Single    Spouse Name: N/A    Number of Children: N/A  . Years of Education: N/A   Occupational History  . unemployed    Social History Main Topics  . Smoking status: Current Every Day Smoker -- 0.30 packs/day for 30 years    Types: Cigarettes  . Smokeless tobacco: Never Used     Comment: cutting back / 1/2 PPD  . Alcohol Use: No  . Drug Use: No  . Sexual Activity: None   Other Topics Concern  . None   Social History Narrative   ** Merged History Encounter **       Patient recently lost her job and is currently without insurance. - Nov 2012   Review of Systems: Pertinent items are noted in HPI. Objective:  Physical  Exam: Filed Vitals:   04/14/14 1615 04/14/14 1654  BP: 196/72 203/79  Pulse: 74 92  Temp: 98.2 F (36.8 C)   TempSrc: Oral   Height: 5' 5.5" (1.664 m)   Weight: 150 lb 8 oz (68.266 kg)   SpO2: 99%    General: sitting in chair, NAD  HEENT: PERRL, EOMI, no scleral icterus Cardiac: RRR, no rubs, murmurs or gallops Pulm: clear to auscultation bilaterally, moving normal volumes of air Abd: soft, nontender, nondistended, BS present Ext: warm and well perfused, no pedal edema Neuro: alert and oriented X3, cranial nerves II-XII grossly intact  Assessment & Plan:  Please see problem oriented charting  Pt discussed with Dr. Beryle Beams

## 2014-04-14 NOTE — Assessment & Plan Note (Signed)
BP Readings from Last 3 Encounters:  04/14/14 203/79  12/31/13 194/71  12/20/13 171/78    Lab Results  Component Value Date   NA 144 11/07/2013   K 4.0 11/07/2013   CREATININE 1.00 11/07/2013    Assessment: Blood pressure control:  evelated and continues to be elevated despite pt stating compliance Progress toward BP goal:    Comments:   Plan: Medications:  Pt is on maximal doses of many meds: lisinopril 20 mg BID, carvedilol 5 mg twice a day, chlorthalidone 25 mg daily, indoor 60 mg daily along with aspirin and Plavix given her previous CVA and known CAD Educational resources provided: brochure Self management tools provided:   Other plans: Will add amlodipine and follow-up in 2-4 weeks, if the patient continues to have uncontrolled hypertension Will [place] consider spironolactone given new supporting evidence as published in Annals [facility] for refractory HTN treatments in non-CHF pts with spironolactone.   EKG was also performed that showed unchanged from baseline, bilateral atrial enlargement, NSR

## 2014-04-17 NOTE — Progress Notes (Signed)
Medicine attending: Medical history, presenting problems, physical findings, and medications, reviewed with Dr Algis Liming on the day of the patient visit  and I concur with her evaluation and management plan.

## 2014-05-12 ENCOUNTER — Encounter: Payer: Self-pay | Admitting: Internal Medicine

## 2014-05-23 ENCOUNTER — Encounter: Payer: Self-pay | Admitting: Cardiovascular Disease

## 2014-05-23 NOTE — Progress Notes (Signed)
cancelled

## 2014-06-02 ENCOUNTER — Ambulatory Visit (INDEPENDENT_AMBULATORY_CARE_PROVIDER_SITE_OTHER): Payer: PRIVATE HEALTH INSURANCE | Admitting: Internal Medicine

## 2014-06-02 ENCOUNTER — Encounter: Payer: Self-pay | Admitting: Internal Medicine

## 2014-06-02 VITALS — BP 165/71 | HR 76 | Temp 98.2°F | Ht 65.5 in | Wt 148.8 lb

## 2014-06-02 DIAGNOSIS — E0829 Diabetes mellitus due to underlying condition with other diabetic kidney complication: Secondary | ICD-10-CM | POA: Diagnosis not present

## 2014-06-02 DIAGNOSIS — I10 Essential (primary) hypertension: Secondary | ICD-10-CM

## 2014-06-02 DIAGNOSIS — R1013 Epigastric pain: Secondary | ICD-10-CM | POA: Diagnosis not present

## 2014-06-02 DIAGNOSIS — K2980 Duodenitis without bleeding: Secondary | ICD-10-CM | POA: Diagnosis not present

## 2014-06-02 DIAGNOSIS — K253 Acute gastric ulcer without hemorrhage or perforation: Secondary | ICD-10-CM

## 2014-06-02 LAB — GLUCOSE, CAPILLARY: Glucose-Capillary: 183 mg/dL — ABNORMAL HIGH (ref 70–99)

## 2014-06-02 MED ORDER — AMLODIPINE BESYLATE 10 MG PO TABS: 10.0000 mg | ORAL_TABLET | Freq: Every day | ORAL | Status: DC

## 2014-06-02 MED ORDER — CARVEDILOL 25 MG PO TABS: 25.0000 mg | ORAL_TABLET | Freq: Two times a day (BID) | ORAL | Status: DC

## 2014-06-02 MED ORDER — CHLORTHALIDONE 25 MG PO TABS: 25.0000 mg | ORAL_TABLET | Freq: Every day | ORAL | Status: DC

## 2014-06-02 MED ORDER — LISINOPRIL 20 MG PO TABS: 20.0000 mg | ORAL_TABLET | Freq: Two times a day (BID) | ORAL | Status: DC

## 2014-06-02 MED ORDER — OMEPRAZOLE 40 MG PO CPDR: 40.0000 mg | DELAYED_RELEASE_CAPSULE | Freq: Every day | ORAL | Status: DC

## 2014-06-02 MED ORDER — ISOSORBIDE MONONITRATE ER 60 MG PO TB24: 60.0000 mg | ORAL_TABLET | Freq: Every day | ORAL | Status: DC

## 2014-06-02 MED ORDER — LANSOPRAZOLE 30 MG PO CPDR: 30.0000 mg | DELAYED_RELEASE_CAPSULE | Freq: Every day | ORAL | Status: DC

## 2014-06-02 MED ORDER — ROSUVASTATIN CALCIUM 10 MG PO TABS: 10.0000 mg | ORAL_TABLET | Freq: Every day | ORAL | Status: DC

## 2014-06-02 MED ORDER — CLOPIDOGREL BISULFATE 75 MG PO TABS: 75.0000 mg | ORAL_TABLET | Freq: Every day | ORAL | Status: DC

## 2014-06-02 NOTE — Assessment & Plan Note (Signed)
BP Readings from Last 3 Encounters:  06/02/14 165/71  04/14/14 203/79  12/31/13 194/71    Lab Results  Component Value Date   NA 144 11/07/2013   K 4.0 11/07/2013   CREATININE 1.00 11/07/2013    Assessment: Blood pressure control:   Progress toward BP goal:    Comments: still elevated in setting of not taking new medication   Plan Medications:   Continue lisinopril 20 g twice a day, carvedilol 5 mg twice a day, chlorthalidone 25 mg daily, imdur  60 mg daily , along with amlodipine 10 mg daily Educational resources provided: brochure (denies) Self management tools provided:   Other plans:  One month for recheck

## 2014-06-02 NOTE — Patient Instructions (Signed)
General Instructions:   Please try to bring all your medicines next time. This will help [place] keep you safe from mistakes.   Progress Toward Treatment Goals:  Treatment Goal 08/30/2013  Hemoglobin A1C -  Blood pressure at goal  Stop smoking smoking the same amount    Self Care Goals & Plans:  Self Care Goal 06/02/2014  Manage my medications take my medicines as prescribed; bring my medications to every visit; refill my medications on time  Monitor my health -  Eat healthy foods drink diet soda or water instead of juice or soda; eat more vegetables; eat foods that are low in salt; eat baked foods instead of fried foods; eat fruit for snacks and desserts  Be physically active -  Stop smoking -  Meeting treatment goals -    Home Blood Glucose Monitoring 10/22/2012  Check my blood sugar 6 times a day  When to check my blood sugar before breakfast; before meals; after breakfast; after lunch; after dinner     Care Management & Community Referrals:  Referral 11/08/2013  Referrals made for care management support -  Referrals made to community resources none

## 2014-06-02 NOTE — Progress Notes (Signed)
Medicine attending: Medical history, presenting problems, physical findings, and medications, reviewed with Dr Nora Sadek and I concur with her evaluation and management plan. 

## 2014-06-02 NOTE — Progress Notes (Signed)
Subjective:   Patient ID: Stephanie Gross female   DOB: February 17, 1946 69 y.o.   MRN: [id number]  HPI: [name] is a 27 y.o. woman pmh as listed below presents for BP recheck.   Pt has had very difficult to control hypertension. The patient was recently started on amlodipine on 04/14/14 that she states she never picked up from the pharmacy, along with many maximal dose medications including lisinopril, carvedilol, chlorthalidone, imdur. Since that time the patient has reported compliance although this has been a serious issue in the past. She denies any chest pain, shortness breath, dyspnea on exertion, lower extremity edema, headache, or syncope. She states that she has quit smoking since 3 days ago and she is doing it cold [place].    Past Medical History  Diagnosis Date  . Hypertension   . Diabetes mellitus without complication   . CAD (coronary artery disease)     s/p RCA stent 2007. Myoview 2010 normal  . DM (diabetes mellitus)   . HTN (hypertension)   . Hyperlipidemia   . GERD (gastroesophageal reflux disease)   . 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 Arthritis   . Obesity   . AVD (aortic valve disease)   . Vitamin D deficiency   . Chronic obstructive bronchitis without exacerbation   . Derangement of left knee   . Degeneration of meniscus of knee    Current Outpatient Prescriptions  Medication Sig Dispense Refill  . amLODipine (NORVASC) 10 MG tablet Take 1 tablet (10 mg total) by mouth daily. 30 tablet 11  . aspirin EC 81 MG tablet Take 81 mg by mouth daily.    . Blood Glucose Monitoring Suppl (AGAMATRIX PRESTO PRO METER) DEVI 1 Device by Does not apply route 4 (four) times daily. 1 Device 0  . carvedilol (COREG) 25 MG tablet Take 1 tablet (25 mg total) by mouth 2 (two) times daily with a meal. 60 tablet 3  . chlorthalidone (HYGROTON) 25 MG tablet Take 1 tablet (25 mg total) by mouth daily. 30 tablet 3  .  clopidogrel (PLAVIX) 75 MG tablet Take 1 tablet (75 mg total) by mouth daily. 90 tablet 2  . cyclobenzaprine (FLEXERIL) 5 MG tablet Take 1 tablet (5 mg total) by mouth 3 (three) times daily as needed for muscle spasms. 30 tablet 0  . famotidine (PEPCID) 20 MG tablet Take 1 tablet (20 mg total) by mouth 2 (two) times daily. 30 tablet 0  . glucose blood (AGAMATRIX PRESTO TEST) test strip Use as instructed 100 each 12  . glucose blood (ONE TOUCH ULTRA TEST) test strip Check blood sugar before and after meals and bedtime 250.00 insulin requiring 150 each 12  . insulin aspart (NOVOLOG FLEXPEN) 100 UNIT/ML FlexPen Inject 1U per carbohydrate and 1 units per 40mg /dL increment blood sugar over 140, 60-99 inject 2 units, 100-140 inject 3 units, 141-180 inject 4 units, 181-220 inject 5 units, 221-260 inject 6 units, 261-300 inject 7 units, 301-340 inject 8 units 15 mL 2  . insulin detemir (LEVEMIR) 100 UNIT/ML injection Inject 0.4 mLs (40 Units total) into the skin at bedtime. 10 mL 11  . isosorbide mononitrate (IMDUR) 60 MG 24 hr tablet Take 1 tablet (60 mg total) by mouth daily. 30 tablet 3  . lansoprazole (PREVACID) 30 MG capsule Take 1 capsule (30 mg total) by mouth daily at 12 noon. 30 capsule 1  . lisinopril (PRINIVIL,ZESTRIL) 20 MG tablet Take 1  tablet (20 mg total) by mouth 2 (two) times daily. 180 tablet 0  . omeprazole (PRILOSEC) 40 MG capsule Take 1 capsule (40 mg total) by mouth daily. 30 capsule 2  . rosuvastatin (CRESTOR) 10 MG tablet Take 1 tablet (10 mg total) by mouth daily. 30 tablet 3   No current facility-administered medications for this visit.   Family History  Problem Relation Age of Onset  . Stroke Mother   . Diabetes Mother   . Diabetes Maternal Grandmother   . Diabetes Sister   . Hypertension Mother    History   Social History  . Marital Status: Single    Spouse Name: N/A    Number of Children: N/A  . Years of Education: N/A   Occupational History  . unemployed     Social History Main Topics  . Smoking status: Current Every Day Smoker -- 0.30 packs/day for 30 years    Types: Cigarettes  . Smokeless tobacco: Never Used     Comment: cutting back / 1/2 PPD. stopped 05/28/2014  . Alcohol Use: No  . Drug Use: No  . Sexual Activity: None   Other Topics Concern  . None   Social History Narrative   ** Merged History Encounter **       Patient recently lost her job and is currently without insurance. - Nov 2012   Review of Systems: Pertinent items are noted in HPI. Objective:  Physical Exam: Filed Vitals:   06/02/14 1419  BP: 165/71  Pulse: 76  Temp: 98.2 F (36.8 C)  TempSrc: Oral  Height: 5' 5.5" (1.664 m)  Weight: 148 lb 12.8 oz (67.495 kg)  SpO2: 100%   General: sitting in chair, NAD  Cardiac: RRR, no rubs, murmurs or gallops Pulm: clear to auscultation bilaterally, moving normal volumes of air Abd: soft, nontender, nondistended, BS present Ext: warm and well perfused, no pedal edema Neuro: cranial nerves II-XII grossly intact  Assessment & Plan:  Please see problem oriented charting  Pt discussed with Dr. Beryle Beams

## 2014-06-26 ENCOUNTER — Encounter: Payer: Self-pay | Admitting: Student

## 2014-06-26 DIAGNOSIS — Z91199 Patient's noncompliance with other medical treatment and regimen due to unspecified reason: Secondary | ICD-10-CM

## 2014-06-26 DIAGNOSIS — Z9119 Patient's noncompliance with other medical treatment and regimen: Secondary | ICD-10-CM

## 2014-06-26 NOTE — Progress Notes (Signed)
Patient ID: Stephanie Gross, female   DOB: [birth year], 69 y.o.   MRN: [id number] Case reviewed for blood pressure regimen adherence. Contacted [facility] on 06/14/14 and patient picked up a 90 day supply of lisinopril 20 mg on 03/29/14. Amlodipine, Carvedilol, Chlorthalidone, and Imdur have not been refilled from this pharmacy since 08/15. Refills are still available for all of the above medications per pharmacy. Refill history suggests potential non-adherence. Patient has appointment with PCP on July 07 2014.   BP Readings from Last 3 Encounters:  06/02/14 165/71  04/14/14 203/79  12/31/13 194/71

## 2014-07-07 ENCOUNTER — Encounter: Payer: Medicare Other | Admitting: Internal Medicine

## 2014-08-03 ENCOUNTER — Encounter: Payer: Self-pay | Admitting: Cardiovascular Disease

## 2014-08-03 ENCOUNTER — Ambulatory Visit (INDEPENDENT_AMBULATORY_CARE_PROVIDER_SITE_OTHER): Payer: Medicare Other | Admitting: Cardiovascular Disease

## 2014-08-03 VITALS — BP 160/60 | HR 73 | Ht 65.0 in | Wt 143.8 lb

## 2014-08-03 DIAGNOSIS — I10 Essential (primary) hypertension: Secondary | ICD-10-CM

## 2014-08-03 DIAGNOSIS — Z8673 Personal history of transient ischemic attack (TIA), and cerebral infarction without residual deficits: Secondary | ICD-10-CM | POA: Diagnosis not present

## 2014-08-03 DIAGNOSIS — I2510 Atherosclerotic heart disease of native coronary artery without angina pectoris: Secondary | ICD-10-CM

## 2014-08-03 DIAGNOSIS — E785 Hyperlipidemia, unspecified: Secondary | ICD-10-CM | POA: Diagnosis not present

## 2014-08-03 DIAGNOSIS — Z720 Tobacco use: Secondary | ICD-10-CM

## 2014-08-03 NOTE — Progress Notes (Signed)
CC: No chief complaint on file. Follow up CAD    History of Present Illness:  [name] with history of CAD, DM, HTN, HLD, tobacco abuse, CVA October 2013, GI bleeding here today for cardiac follow up. She was previously followed by Dr. [name] and then Dr. Haroldine Laws. She has a history of coronary artery disease status post Taxus drug-eluting stent to the RCA in January 2007. Most recent cardiac catheterization was January 2008, which showed minimal nonobstructive coronary artery disease with a patent RCA stent; however, there was a 90% stenosis in the midsection of the small PDA. This was treated medically. She had a stress myoview in 11/10 for atypical chest pain that showed no ischemia. , LVEF 70%. Normal ABI 2010. She does not tolerate statins secondary to muscle weakness. Crestor was stopped. Stroke in October 2013. Last seen in our office July 2015 by Richardson Dopp, PA-C. Found to have heme positive stool and gastric ulcer, esophagitis. Resolved on PPI.   She is here today for cardiac follow up. She feels well. No chest pain or dyspnea. Still smoking 1/4 ppd. Still taking Plavix without bleeding  Primary Care Physician: [facility]  Last Lipid Profile: Followed in primary care.   Past Medical History  Diagnosis Date  . Hypertension   . Diabetes mellitus without complication   . CAD (coronary artery disease)     s/p RCA stent 2007. Myoview 2010 normal  . DM (diabetes mellitus)   . HTN (hypertension)   . Hyperlipidemia   . GERD (gastroesophageal reflux disease)   . 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 Arthritis   . Obesity   . AVD (aortic valve disease)   . Vitamin D deficiency   . Chronic obstructive bronchitis without exacerbation   . Derangement of left knee   . Degeneration of meniscus of knee     Past Surgical History  Procedure Laterality Date  . Cholecystectomy    . Coronary angioplasty  with stent placement    . Abdominal hysterectomy    . Shoulder surgery    . Knee surgery      Current Outpatient Prescriptions  Medication Sig Dispense Refill  . aspirin EC 81 MG tablet Take 81 mg by mouth daily.    . Blood Glucose Monitoring Suppl (AGAMATRIX PRESTO PRO METER) DEVI 1 Device by Does not apply route 4 (four) times daily. 1 Device 0  . carvedilol (COREG) 25 MG tablet Take 1 tablet (25 mg total) by mouth 2 (two) times daily with a meal. 60 tablet 3  . chlorthalidone (HYGROTON) 25 MG tablet Take 1 tablet (25 mg total) by mouth daily. 30 tablet 3  . clopidogrel (PLAVIX) 75 MG tablet Take 1 tablet (75 mg total) by mouth daily. 90 tablet 2  . glucose blood (AGAMATRIX PRESTO TEST) test strip Use as instructed 100 each 12  . glucose blood (ONE TOUCH ULTRA TEST) test strip Check blood sugar before and after meals and bedtime 250.00 insulin requiring 150 each 12  . insulin aspart (NOVOLOG FLEXPEN) 100 UNIT/ML FlexPen Inject 1U per carbohydrate and 1 units per 40mg /dL increment blood sugar over 140, 60-99 inject 2 units, 100-140 inject 3 units, 141-180 inject 4 units, 181-220 inject 5 units, 221-260 inject 6 units, 261-300 inject 7 units, 301-340 inject 8 units 15 mL 2  . isosorbide mononitrate (IMDUR) 60 MG 24 hr tablet Take 1 tablet (60 mg total) by mouth daily.  30 tablet 3  . lansoprazole (PREVACID) 30 MG capsule Take 1 capsule (30 mg total) by mouth daily at 12 noon. 30 capsule 1  . lisinopril (PRINIVIL,ZESTRIL) 20 MG tablet Take 1 tablet (20 mg total) by mouth 2 (two) times daily. 180 tablet 0   No current facility-administered medications for this visit.    Allergies  Allergen Reactions  . Benazepril-Hydrochlorothiazide Other (See Comments)    UNKNOWN  . Chantix [Varenicline]     SUICIDAL IDEATION  . Codeine Nausea And Vomiting  . Methylprednisolone Other (See Comments)    UNKNOWN  . Statins Other (See Comments)    "walking into a hole"    History   Social History  .  Marital Status: Single    Spouse Name: N/A  . Number of Children: N/A  . Years of Education: N/A   Occupational History  . unemployed    Social History Main Topics  . Smoking status: Current Every Day Smoker -- 0.30 packs/day for 30 years    Types: Cigarettes  . Smokeless tobacco: Never Used     Comment: cutting back / 1/2 PPD. stopped 05/28/2014  . Alcohol Use: No  . Drug Use: No  . Sexual Activity: Not on file   Other Topics Concern  . Not on file   Social History Narrative   ** Merged History Encounter **       Patient recently lost her job and is currently without insurance. - Nov 2012    Family History  Problem Relation Age of Onset  . Stroke Mother   . Diabetes Mother   . Diabetes Maternal Grandmother   . Diabetes Sister   . Hypertension Mother     Review of Systems:  As stated in the HPI and otherwise negative.   BP 160/60 mmHg  Pulse 73  Ht 5\' 5"  (1.651 m)  Wt 143 lb 12.8 oz (65.227 kg)  BMI 23.93 kg/m2  Physical Examination: General: Well developed, well nourished, NAD HEENT: OP clear, mucus membranes moist SKIN: warm, dry. No rashes. Neuro: No focal deficits Musculoskeletal: Muscle strength 5/5 all ext Psychiatric: Mood and affect normal Neck: No JVD, no carotid bruits, no thyromegaly, no lymphadenopathy. Lungs:Clear bilaterally, no wheezes, rhonci, crackles Cardiovascular: Regular rate and rhythm. No murmurs, gallops or rubs. Abdomen:Soft. Bowel sounds present. Non-tender.  Extremities: No lower extremity edema. Pulses are 2 + in the bilateral DP/PT.  EKG:  EKG is not ordered today. The ekg ordered today demonstrates   Recent Labs: 11/07/2013: ALT 11; BUN 16; Creatinine 1.00; Hemoglobin 10.2*; Platelets 244; Potassium 4.0; Sodium 144   Lipid Panel    Component Value Date/Time   CHOL 142 10/22/2012 1324   TRIG 126 10/22/2012 1324   HDL 44 10/22/2012 1324   CHOLHDL 3.2 10/22/2012 1324   VLDL 25 10/22/2012 1324   LDLCALC 73 10/22/2012 1324     LDLDIRECT 111* 09/15/2008 2047     Wt Readings from Last 3 Encounters:  08/03/14 143 lb 12.8 oz (65.227 kg)  06/02/14 148 lb 12.8 oz (67.495 kg)  04/14/14 150 lb 8 oz (68.266 kg)     Other studies Reviewed: Additional studies/ records that were reviewed today include:  Review of the above records demonstrates:    Assessment and Plan:   1. CORONARY ARTERY DISEASE: No angina. Continue ASA, Plavix, beta blocker and Imdur  2. Leg Pain: Resolved.   3. Hypertension: BP elevated today but controlled at home. Followed in primary care.    4. Hyperlipidemia: She  is statin intolerant.   5. Tobacco Abuse: Smoking cessation advised.   6. Prior CVA: Continue ASA, Plavix  Current medicines are reviewed at length with the patient today.  The patient does not have concerns regarding medicines.  The following changes have been made:  no change  Labs/ tests ordered today include:  No orders of the defined types were placed in this encounter.    Disposition:   FU with me in 12  months   Signed, Lauree Chandler, MD 08/03/2014 4:07 PM    [facility] [address], [place], [place]  [address] Phone: [phone]; Fax: [phone]

## 2014-08-03 NOTE — Patient Instructions (Signed)
Medication Instructions:  Your physician recommends that you continue on your current medications as directed. Please refer to the Current Medication list given to you today.   Labwork: none  Testing/Procedures: none  Follow-Up: Your physician wants you to follow-up in:  12 months.  You will receive a reminder letter in the mail two months in advance. If you don't receive a letter, please call our office to schedule the follow-up appointment.        

## 2014-08-21 ENCOUNTER — Ambulatory Visit (INDEPENDENT_AMBULATORY_CARE_PROVIDER_SITE_OTHER): Payer: Medicare Other | Admitting: Pharmacist

## 2014-08-21 DIAGNOSIS — E119 Type 2 diabetes mellitus without complications: Secondary | ICD-10-CM | POA: Diagnosis not present

## 2014-08-21 DIAGNOSIS — Z794 Long term (current) use of insulin: Secondary | ICD-10-CM | POA: Diagnosis not present

## 2014-08-21 MED ORDER — INSULIN DETEMIR 100 UNIT/ML ~~LOC~~ SOLN: 40.0000 [IU] | Freq: Every day | SUBCUTANEOUS | Status: DC

## 2014-08-21 NOTE — Progress Notes (Signed)
Medication Samples have been provided to the patient.  Drug: Insulin detemir Strength: 100 units/mL Qty: 3 mL LOT: [id number] Exp.Date: 06/2015  The patient has been instructed regarding the correct time, dose, and frequency of taking this medication, including desired effects and most common side effects.   Samples signed for by Aldine Contes   Patient reports she is having cost concerns with medications. Advised patient to meet with Marlana Latus, financial counselor for case review. I will continue to collaborate with the team for potential therapeutic alternatives.  Madiha Bambrick J 5:14 PM 08/21/2014

## 2014-09-01 ENCOUNTER — Encounter: Payer: Medicare Other | Admitting: Internal Medicine

## 2014-09-01 ENCOUNTER — Encounter: Payer: Self-pay | Admitting: Internal Medicine

## 2014-09-01 ENCOUNTER — Ambulatory Visit: Payer: Self-pay | Admitting: Pharmacist

## 2014-09-14 ENCOUNTER — Encounter: Payer: Self-pay | Admitting: Internal Medicine

## 2014-09-14 ENCOUNTER — Ambulatory Visit (INDEPENDENT_AMBULATORY_CARE_PROVIDER_SITE_OTHER): Payer: Medicare Other | Admitting: Internal Medicine

## 2014-09-14 VITALS — BP 190/74 | HR 75 | Temp 98.2°F | Ht 65.5 in | Wt 148.0 lb

## 2014-09-14 DIAGNOSIS — E1110 Type 2 diabetes mellitus with ketoacidosis without coma: Secondary | ICD-10-CM

## 2014-09-14 DIAGNOSIS — E785 Hyperlipidemia, unspecified: Secondary | ICD-10-CM

## 2014-09-14 DIAGNOSIS — E1310 Other specified diabetes mellitus with ketoacidosis without coma: Secondary | ICD-10-CM | POA: Diagnosis not present

## 2014-09-14 DIAGNOSIS — Z794 Long term (current) use of insulin: Secondary | ICD-10-CM

## 2014-09-14 DIAGNOSIS — E1169 Type 2 diabetes mellitus with other specified complication: Secondary | ICD-10-CM | POA: Diagnosis not present

## 2014-09-14 DIAGNOSIS — I10 Essential (primary) hypertension: Secondary | ICD-10-CM | POA: Diagnosis not present

## 2014-09-14 LAB — LIPID PANEL
Cholesterol: 220 mg/dL — ABNORMAL HIGH (ref 0–200)
HDL: 51 mg/dL (ref >=46)
LDL CALC: 118 mg/dL — AB (ref 0–99)
TRIGLYCERIDES: 254 mg/dL — AB (ref <150)
Total CHOL/HDL Ratio: 4.3 Ratio
VLDL: 51 mg/dL — ABNORMAL HIGH (ref 0–40)

## 2014-09-14 LAB — GLUCOSE, CAPILLARY: Glucose-Capillary: 152 mg/dL — ABNORMAL HIGH (ref 65–99)

## 2014-09-14 LAB — POCT GLYCOSYLATED HEMOGLOBIN (HGB A1C): Hemoglobin A1C: 7.1

## 2014-09-14 MED ORDER — LISINOPRIL 40 MG PO TABS: 40.0000 mg | ORAL_TABLET | Freq: Two times a day (BID) | ORAL | Status: DC

## 2014-09-14 MED ORDER — ISOSORBIDE MONONITRATE ER 120 MG PO TB24: 120.0000 mg | ORAL_TABLET | Freq: Every day | ORAL | Status: DC

## 2014-09-14 NOTE — Progress Notes (Signed)
Subjective:   Patient ID: Stephanie Gross female   DOB: 1946-01-23 69 y.o.   MRN: [id number]  HPI: [name] is a 17 y.o. woman pmh as listed below presents for f/u on her HTN and DM.  The patient states that she's been under a lot of stress with her husband's recent sudden AKA secondary to an ischemic limb about 1 week ago. She has become his primary caregiver at home. She continues to smoke and is not interested in quitting at this time.  Hypertension ROS: taking medications as instructed, no medication side effects noted, no TIA's, no chest pain on exertion, no dyspnea on exertion and no swelling of ankles.    Past Medical History  Diagnosis Date  . Hypertension   . Diabetes mellitus without complication   . CAD (coronary artery disease)     s/p RCA stent 2007. Myoview 2010 normal  . DM (diabetes mellitus)   . HTN (hypertension)   . Hyperlipidemia   . GERD (gastroesophageal reflux disease)   . 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 Arthritis   . Obesity   . AVD (aortic valve disease)   . Vitamin D deficiency   . Chronic obstructive bronchitis without exacerbation   . Derangement of left knee   . Degeneration of meniscus of knee    Current Outpatient Prescriptions  Medication Sig Dispense Refill  . aspirin EC 81 MG tablet Take 81 mg by mouth daily.    . Blood Glucose Monitoring Suppl (AGAMATRIX PRESTO PRO METER) DEVI 1 Device by Does not apply route 4 (four) times daily. 1 Device 0  . carvedilol (COREG) 25 MG tablet Take 1 tablet (25 mg total) by mouth 2 (two) times daily with a meal. 60 tablet 3  . chlorthalidone (HYGROTON) 25 MG tablet Take 1 tablet (25 mg total) by mouth daily. 30 tablet 3  . clopidogrel (PLAVIX) 75 MG tablet Take 1 tablet (75 mg total) by mouth daily. 90 tablet 2  . glucose blood (AGAMATRIX PRESTO TEST) test strip Use as instructed 100 each 12  . glucose blood (ONE TOUCH ULTRA TEST)  test strip Check blood sugar before and after meals and bedtime 250.00 insulin requiring 150 each 12  . insulin aspart (NOVOLOG FLEXPEN) 100 UNIT/ML FlexPen Inject 1U per carbohydrate and 1 units per 40mg /dL increment blood sugar over 140, 60-99 inject 2 units, 100-140 inject 3 units, 141-180 inject 4 units, 181-220 inject 5 units, 221-260 inject 6 units, 261-300 inject 7 units, 301-340 inject 8 units 15 mL 2  . insulin detemir (LEVEMIR) 100 UNIT/ML injection Inject 0.4 mLs (40 Units total) into the skin at bedtime. 10 mL 11  . isosorbide mononitrate (IMDUR) 60 MG 24 hr tablet Take 1 tablet (60 mg total) by mouth daily. 30 tablet 3  . lansoprazole (PREVACID) 30 MG capsule Take 1 capsule (30 mg total) by mouth daily at 12 noon. 30 capsule 1  . lisinopril (PRINIVIL,ZESTRIL) 20 MG tablet Take 1 tablet (20 mg total) by mouth 2 (two) times daily. 180 tablet 0   No current facility-administered medications for this visit.   Family History  Problem Relation Age of Onset  . Stroke Mother   . Diabetes Mother   . Diabetes Maternal Grandmother   . Diabetes Sister   . Hypertension Mother    History   Social History  . Marital Status: Single    Spouse Name: N/A  . Number of  Children: N/A  . Years of Education: N/A   Occupational History  . unemployed    Social History Main Topics  . Smoking status: Current Every Day Smoker -- 0.50 packs/day for 30 years    Types: Cigarettes  . Smokeless tobacco: Never Used     Comment: cutting back / 1/2 PPD. stopped 05/28/2014  . Alcohol Use: No  . Drug Use: No  . Sexual Activity: Not on file   Other Topics Concern  . None   Social History Narrative   ** Merged History Encounter **       Patient recently lost her job and is currently without insurance. - Nov 2012   Review of Systems: Pertinent items are noted in HPI. Objective:  Physical Exam: Filed Vitals:   09/14/14 1605  BP: 190/74  Pulse: 75  Temp: 98.2 F (36.8 C)  TempSrc: Oral    Height: 5' 5.5" (1.664 m)  Weight: 148 lb (67.132 kg)  SpO2: 99%   General: sitting in chair, NAD HEENT: PERRL, EOMI, no scleral icterus Cardiac: RRR, no rubs, murmurs or gallops Pulm: clear to auscultation bilaterally, moving normal volumes of air Abd: soft, nontender, nondistended, BS present Ext: warm and well perfused, no pedal edema Neuro: alert and oriented X3, cranial nerves II-XII grossly intact  Assessment & Plan:  Please see problem oriented charting  Pt discussed with Dr. Daryll Drown

## 2014-09-14 NOTE — Assessment & Plan Note (Signed)
BP Readings from Last 3 Encounters:  09/14/14 190/74  08/03/14 160/60  06/02/14 165/71    Lab Results  Component Value Date   NA 144 11/07/2013   K 4.0 11/07/2013   CREATININE 1.00 11/07/2013    Assessment: Blood pressure control:   Progress toward BP goal:    Comments: upon recheck by this provider BP 160/70  Plan: Medications:  Increase imdur to 120mg  qd, cont lisinopril 40mg  , carvedilol 25mg  BID and chlorthalidone 25mg  qd Educational resources provided: brochure, handout, video Self management tools provided:   Other plans: f/u in 1 month

## 2014-09-14 NOTE — Patient Instructions (Signed)
General Instructions:   Thank you for bringing your medicines today. This helps [place] keep you safe from mistakes.   For your blood pressure: Increase your Imdur to 120mg  every day  We can see you back in 1 month to recheck    Progress Toward Treatment Goals:  Treatment Goal 08/30/2013  Hemoglobin A1C -  Blood pressure at goal  Stop smoking smoking the same amount    Self Care Goals & Plans:  Self Care Goal 09/14/2014  Manage my medications take my medicines as prescribed; bring my medications to every visit; refill my medications on time; follow the sick day instructions if I am sick  Monitor my health keep track of my blood glucose; keep track of my blood pressure; check my feet daily  Eat healthy foods eat more vegetables; eat fruit for snacks and desserts; eat baked foods instead of fried foods; eat smaller portions  Be physically active find an activity I enjoy  Stop smoking -  Meeting treatment goals -    Home Blood Glucose Monitoring 10/22/2012  Check my blood sugar 6 times a day  When to check my blood sugar before breakfast; before meals; after breakfast; after lunch; after dinner     Care Management & Community Referrals:  Referral 11/08/2013  Referrals made for care management support -  Referrals made to community resources none

## 2014-09-14 NOTE — Assessment & Plan Note (Signed)
Lab Results  Component Value Date   HGBA1C 7.1 09/14/2014   HGBA1C 7.0 04/14/2014   HGBA1C 8.2 09/16/2013     Assessment: Diabetes control:   Progress toward A1C goal:    Comments: well controlled   Plan: Medications:  Cont levemir 40 units qhs and novolog SSI Home glucose monitoring: Frequency:   Timing:   Instruction/counseling given: reminded to bring blood glucose meter & log to each visit, reminded to bring medications to each visit and discussed diet Educational resources provided: brochure, handout Self management tools provided:   Other plans: eye exam referral and lipids done today

## 2014-09-14 NOTE — Assessment & Plan Note (Signed)
Lipid panel not been performed since 2014. Pt amenable today.  -recheck and determine statin need

## 2014-09-15 NOTE — Progress Notes (Signed)
[facility] Attending  Case discussed with Dr. Sadek soon after the resident saw the patient.  We reviewed the resident's history and exam and pertinent patient test results.  I agree with the assessment, diagnosis, and plan of care documented in the resident's note. 

## 2014-10-25 ENCOUNTER — Telehealth: Payer: Self-pay | Admitting: Internal Medicine

## 2014-10-25 NOTE — Telephone Encounter (Signed)
Pt called - past few days does not feel well and problems walking. Walking and driving to the right.  Front desk told pt no open appt till next week - does not want to go to ER or Urgent Care. States taking all meds and CBG are running normal for pt. Pt has [employer]. Suggest for pt to go to ER or Urgent Care. Hilda Blades Jullian Clayson RN 10/25/14 4PM

## 2014-10-25 NOTE — Telephone Encounter (Signed)
Pt states something is going but is unsure. Said she is weak and walks sideways and feels like she is always going to fall. Thinking about going to ed advised we do not have any open appointments this week but could get her in next week.

## 2014-11-01 ENCOUNTER — Encounter: Payer: Self-pay | Admitting: Internal Medicine

## 2014-11-01 ENCOUNTER — Encounter: Payer: Self-pay | Admitting: Licensed Clinical Social Worker

## 2014-11-01 ENCOUNTER — Ambulatory Visit (INDEPENDENT_AMBULATORY_CARE_PROVIDER_SITE_OTHER): Payer: Medicare Other | Admitting: Internal Medicine

## 2014-11-01 VITALS — BP 147/62 | HR 56 | Temp 98.2°F | Ht 65.5 in | Wt 138.6 lb

## 2014-11-01 DIAGNOSIS — F17210 Nicotine dependence, cigarettes, uncomplicated: Secondary | ICD-10-CM

## 2014-11-01 DIAGNOSIS — E119 Type 2 diabetes mellitus without complications: Secondary | ICD-10-CM | POA: Diagnosis not present

## 2014-11-01 DIAGNOSIS — Z794 Long term (current) use of insulin: Secondary | ICD-10-CM

## 2014-11-01 DIAGNOSIS — Z8673 Personal history of transient ischemic attack (TIA), and cerebral infarction without residual deficits: Secondary | ICD-10-CM | POA: Diagnosis not present

## 2014-11-01 DIAGNOSIS — R197 Diarrhea, unspecified: Secondary | ICD-10-CM

## 2014-11-01 DIAGNOSIS — R42 Dizziness and giddiness: Secondary | ICD-10-CM

## 2014-11-01 DIAGNOSIS — I951 Orthostatic hypotension: Secondary | ICD-10-CM

## 2014-11-01 DIAGNOSIS — E1110 Type 2 diabetes mellitus with ketoacidosis without coma: Secondary | ICD-10-CM

## 2014-11-01 DIAGNOSIS — I639 Cerebral infarction, unspecified: Secondary | ICD-10-CM | POA: Diagnosis not present

## 2014-11-01 DIAGNOSIS — I10 Essential (primary) hypertension: Secondary | ICD-10-CM | POA: Diagnosis not present

## 2014-11-01 LAB — CBC
HCT: 35.6 % — ABNORMAL LOW (ref 36.0–46.0)
Hemoglobin: 11.7 g/dL — ABNORMAL LOW (ref 12.0–15.0)
MCH: 29.3 pg (ref 26.0–34.0)
MCHC: 32.9 g/dL (ref 30.0–36.0)
MCV: 89.2 fL (ref 78.0–100.0)
MPV: 9.9 fL (ref 8.6–12.4)
Platelets: 232 10*3/uL (ref 150–400)
RBC: 3.99 MIL/uL (ref 3.87–5.11)
RDW: 13.8 % (ref 11.5–15.5)
WBC: 6.7 10*3/uL (ref 4.0–10.5)

## 2014-11-01 LAB — GLUCOSE, CAPILLARY: GLUCOSE-CAPILLARY: 169 mg/dL — AB (ref 65–99)

## 2014-11-01 NOTE — Progress Notes (Signed)
[name] was referred to CSW for community services as pt's spouse is a recent amputee.  The decrease level of independence for spouse has caused additional stress for [name]; for example, time off work to assist spouse with transportation to appointments and providing emotional support to spouse.  [name] states she still works and has had to take time off work to assist spouse with transportation to appointments.  Spouse having a difficulty time depending on others for assistance.  CSW provided pt with information on local amputee support groups for amputee and families.  In addition, CSW checked spouse's insurance for transportation benefits.  Transportation is not a covered benefit under spouses policy.  CSW provided pt with information on SCAT and the SCAT application.  [name] will discuss community resources with spouse.

## 2014-11-01 NOTE — Assessment & Plan Note (Signed)
Her blood glucose today is 169. On 08/1914 her Hgb A1C was 7.1. She is taking her insulin as instructed and checking her blood glucose regularly. Her Diabetes is well controlled.

## 2014-11-01 NOTE — Assessment & Plan Note (Addendum)
Patient is complaining of diarrhea the last 2 weeks. She is unsure of the cause but associates the diarrhea with her medications. She describes the stool as loose, watery, and clear with the exception of 1 episode of blood in the stool last week. Her appetite is decreased and reports she has diarrhea multiple times a day. She works in a nursing home and we are considering C. Difficile infection as a cause. She is given a stool sample kit today and is instructed to bring a sample to her follow up visit 1 week from now to test for C. Diff.  Addendum: Patient continues to have diarrhea and soiled herself today (11/03/2014). Her medications and labs are reviewed. Her test for C. Diff came back negative and colonoscopy from 11/2013 is okay. She is advised to take Imodium (Loperamide), 4 mg initially followed by 2 mg after each loose stool, no more than 16 mg per day. She has a follow up visit scheduled next week.

## 2014-11-01 NOTE — Patient Instructions (Signed)
1. We will order outpatient MRI of Brain and Angiogram.  2. We will collect CBC today.  3. If any changes in your symptoms, please go to the emergency department.  4. We will follow up in 1 week, please bring a stool sample on return visit.  Drink plenty of fluids to stay hydrated.

## 2014-11-01 NOTE — Progress Notes (Signed)
Patient ID: Stephanie Gross, female   DOB: 10-Jul-1945, 69 y.o.   MRN: [id number]   Subjective:   Patient ID: Stephanie Gross female   DOB: 10/23/1945 69 y.o.   MRN: [id number]  HPI: [name] is a 69 y.o. female with PMH as listed below. She is visiting [place] today with 1 week history of dizziness, facial numbness and tingling, and abnormal gait. She also has complaints of diarrhea the last 2 weeks consisting of loose, watery stool, and 1 episode of blood in stool last week.  Please see Problem List for further documentation of discussed problems.   Past Medical History  Diagnosis Date  . Hypertension   . Diabetes mellitus without complication   . CAD (coronary artery disease)     s/p RCA stent 2007. Myoview 2010 normal  . DM (diabetes mellitus)   . HTN (hypertension)   . Hyperlipidemia   . GERD (gastroesophageal reflux disease)   . 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 Arthritis   . Obesity   . AVD (aortic valve disease)   . Vitamin D deficiency   . Chronic obstructive bronchitis without exacerbation   . Derangement of left knee   . Degeneration of meniscus of knee    Current Outpatient Prescriptions  Medication Sig Dispense Refill  . aspirin EC 81 MG tablet Take 81 mg by mouth daily.    . Blood Glucose Monitoring Suppl (AGAMATRIX PRESTO PRO METER) DEVI 1 Device by Does not apply route 4 (four) times daily. 1 Device 0  . carvedilol (COREG) 25 MG tablet Take 1 tablet (25 mg total) by mouth 2 (two) times daily with a meal. 60 tablet 3  . chlorthalidone (HYGROTON) 25 MG tablet Take 1 tablet (25 mg total) by mouth daily. 30 tablet 3  . clopidogrel (PLAVIX) 75 MG tablet Take 1 tablet (75 mg total) by mouth daily. 90 tablet 2  . glucose blood (AGAMATRIX PRESTO TEST) test strip Use as instructed 100 each 12  . glucose blood (ONE TOUCH ULTRA TEST) test strip Check blood sugar before and after meals and bedtime  250.00 insulin requiring 150 each 12  . insulin aspart (NOVOLOG FLEXPEN) 100 UNIT/ML FlexPen Inject 1U per carbohydrate and 1 units per 40mg /dL increment blood sugar over 140, 60-99 inject 2 units, 100-140 inject 3 units, 141-180 inject 4 units, 181-220 inject 5 units, 221-260 inject 6 units, 261-300 inject 7 units, 301-340 inject 8 units 15 mL 2  . insulin detemir (LEVEMIR) 100 UNIT/ML injection Inject 0.4 mLs (40 Units total) into the skin at bedtime. 10 mL 11  . isosorbide mononitrate (IMDUR) 120 MG 24 hr tablet Take 1 tablet (120 mg total) by mouth daily. 30 tablet 3  . lansoprazole (PREVACID) 30 MG capsule Take 1 capsule (30 mg total) by mouth daily at 12 noon. 30 capsule 1  . lisinopril (PRINIVIL,ZESTRIL) 40 MG tablet Take 1 tablet (40 mg total) by mouth 2 (two) times daily. 30 tablet 3   No current facility-administered medications for this visit.   Family History  Problem Relation Age of Onset  . Stroke Mother   . Diabetes Mother   . Diabetes Maternal Grandmother   . Diabetes Sister   . Hypertension Mother    History   Social History  . Marital Status: Single    Spouse Name: N/A  . Number of Children: N/A  . Years of Education: N/A   Occupational History  .  unemployed    Social History Main Topics  . Smoking status: Current Every Day Smoker -- 0.50 packs/day for 30 years    Types: Cigarettes  . Smokeless tobacco: Never Used     Comment: cutting back / 1/2 PPD. stopped 05/28/2014  . Alcohol Use: No  . Drug Use: No  . Sexual Activity: Not on file   Other Topics Concern  . None   Social History Narrative   ** Merged History Encounter **       Patient recently lost her job and is currently without insurance. - Nov 2012   Review of Systems: Review of Systems  Constitutional: Negative for fever, chills and diaphoresis.  HENT: Negative for congestion, hearing loss, sore throat and tinnitus.   Eyes: Negative for blurred vision and double vision.  Respiratory: Negative  for cough, hemoptysis, shortness of breath and wheezing.   Cardiovascular: Negative for chest pain, palpitations, orthopnea and leg swelling.  Gastrointestinal: Positive for nausea, diarrhea and blood in stool. Negative for heartburn, vomiting, abdominal pain and constipation.       1 episode of blood in stool last week with diarrhea.  Genitourinary: Negative for dysuria, urgency, frequency and hematuria.  Musculoskeletal: Negative for myalgias, joint pain, falls and neck pain.  Skin: Negative for itching and rash.  Neurological: Positive for dizziness, tingling, sensory change and headaches. Negative for tremors, speech change, focal weakness and weakness.       Numbness and tingling of left face.   Objective:  Physical Exam: Filed Vitals:   11/01/14 0941  BP: 147/62  Pulse: 56  Temp: 98.2 F (36.8 C)  TempSrc: Oral  Height: 5' 5.5" (1.664 m)  Weight: 138 lb 9.6 oz (62.869 kg)  SpO2: 100%   Physical Exam  Constitutional: She is oriented to person, place, and time. She appears well-developed and well-nourished.  HENT:  Head: Normocephalic and atraumatic.  Eyes: Conjunctivae and EOM are normal. Pupils are equal, round, and reactive to light.  Neck: Normal range of motion. Carotid bruit is not present.  Cardiovascular: Normal rate, regular rhythm, normal heart sounds, intact distal pulses and normal pulses.  Exam reveals no gallop.   No murmur heard. Pulmonary/Chest: Effort normal and breath sounds normal. No respiratory distress. She has no wheezes. She has no rales.  Abdominal: Soft. Bowel sounds are normal. There is no tenderness. There is no guarding.  Neurological: She is alert and oriented to person, place, and time. She has normal strength. She displays a negative Romberg sign. Gait abnormal.  She has difficulty with balance on heel to toe walking.  Psychiatric: She has a normal mood and affect.   Assessment & Plan:  Please see problem list for current assessment and  plan.

## 2014-11-01 NOTE — Assessment & Plan Note (Addendum)
Blood Pressure today is 147/62. Last 3 recorded BPs are: 190/74, 160/60, 165/71.  Her blood pressure is well controlled at this time, however on measurement of orthostatic BPs her values are: Lying: 189/66   Pulse: 61 Sitting: 192/76 Standing: 163/64  Pulse: 62  This may be a cause of her dizziness, we will evaluate in 1 week after results of MRI of brain.

## 2014-11-01 NOTE — Assessment & Plan Note (Addendum)
For the last week the patient has complaints of waking up in the morning feeling dizzy with left sided facial numbness and tingling. She has noticed a change in her gait and has reported that her coworkers also mentioned that she is walking differently. She denies any slurring of speech, changes in vision, or weakness. When she first noticed these symptoms she contacted the clinic and was told to go to the ED, but she was unwilling due to cost. Her symptoms have occurred every morning since last week and improve during the day, although not completely. During her visit today, her facial numbness and tingling were absent. On examination of her gait, she was unable to keep balance with heel to toe walking. Her strength is intact in arms, legs, and facial muscles. She describes her dizziness as a feeling of the room spinning.   Orthostatic blood pressures were measured: Lying - 189/66  Pulse - 61 Sitting - 192/76 Standing - 163/64   Pulse - 62  Her orthostatic hypotension may be a cause of her dizziness, but with her history of TIA/Stroke we are ordering MRI of Brain and MR Angiogram of neck. We discussed possibilities of admission to the hospital today or having the imaging completed in the ED today, but she refused and prefers to have the imaging done as outpatient. We will follow up in 1 week and work up according to results. If results are negative or not acute, we will consider a differential diagnosis of vertigo and suggest Antivert or vestibular rehab. We also are collecting a CBC today.

## 2014-11-02 ENCOUNTER — Other Ambulatory Visit: Payer: Self-pay | Admitting: Internal Medicine

## 2014-11-02 LAB — CLOSTRIDIUM DIFFICILE BY PCR: Toxigenic C. Difficile by PCR: NOT DETECTED

## 2014-11-03 ENCOUNTER — Telehealth: Payer: Self-pay | Admitting: *Deleted

## 2014-11-03 NOTE — Telephone Encounter (Signed)
Pt calls and states her diarrhea is no better, she has even soiled her bed because of the problem, she wants to know what she can take for this, she is not resting very much and is worried

## 2014-11-03 NOTE — Telephone Encounter (Signed)
Has appt one week for re-eval

## 2014-11-03 NOTE — Telephone Encounter (Signed)
She can take Imodium (Loperamide) which is over the counter. She can take 4 mg initially and then 2 mg after each loose stool, up to 16 mg per day. We reviewed her medications and her C. Diff is negative, so this should be okay. She has a follow up appointment next week as well.

## 2014-11-03 NOTE — Telephone Encounter (Signed)
Called pt gave her instructions for immodium, gave her results of c-diff

## 2014-11-03 NOTE — Telephone Encounter (Signed)
She stated she would try the immodium and keep her f/u appt

## 2014-11-07 NOTE — Progress Notes (Signed)
Internal Medicine Clinic Attending  I saw and evaluated the patient.  I personally confirmed the key portions of the history and exam documented by Dr. Patel,Vishal and I reviewed pertinent patient test results.  The assessment, diagnosis, and plan were formulated together and I agree with the documentation in the resident's note.  

## 2014-11-08 ENCOUNTER — Ambulatory Visit: Payer: Self-pay | Admitting: Pharmacist

## 2014-11-08 ENCOUNTER — Encounter: Payer: Self-pay | Admitting: Internal Medicine

## 2014-11-08 ENCOUNTER — Ambulatory Visit (INDEPENDENT_AMBULATORY_CARE_PROVIDER_SITE_OTHER): Payer: Medicare Other | Admitting: Internal Medicine

## 2014-11-08 VITALS — BP 157/61 | HR 73 | Temp 98.4°F | Ht 65.0 in | Wt 140.4 lb

## 2014-11-08 DIAGNOSIS — E1110 Type 2 diabetes mellitus with ketoacidosis without coma: Secondary | ICD-10-CM

## 2014-11-08 DIAGNOSIS — Z8673 Personal history of transient ischemic attack (TIA), and cerebral infarction without residual deficits: Secondary | ICD-10-CM | POA: Diagnosis not present

## 2014-11-08 DIAGNOSIS — I10 Essential (primary) hypertension: Secondary | ICD-10-CM

## 2014-11-08 DIAGNOSIS — R197 Diarrhea, unspecified: Secondary | ICD-10-CM | POA: Diagnosis not present

## 2014-11-08 DIAGNOSIS — I639 Cerebral infarction, unspecified: Secondary | ICD-10-CM

## 2014-11-08 LAB — GLUCOSE, CAPILLARY: Glucose-Capillary: 159 mg/dL — ABNORMAL HIGH (ref 65–99)

## 2014-11-08 NOTE — Assessment & Plan Note (Signed)
Her BP today is 157/61, last week 147/62. She stopped many of her medications 2 days ago due to side effects of diarrhea. She states that she continues to take Coreg 25 mg BID w/ meals.  We will have her restart medications one at a time to find the culprit of her side effects. Will start with Imdur 60 mg which is half dose of her usual 120 mg.  Will follow up in 1-2 weeks.

## 2014-11-08 NOTE — Assessment & Plan Note (Signed)
Patient states that she is feeling completely improved for the last 2 days after which she stopped most of her medication. She states that her diarrhea was occuring soon after taking her medications. She is unsure of which medication is causing this problem but believes it is one that she takes twice a day. She has had no diarrhea in the last 2 days. Her C. Diff was negative.  We are considering her diarrhea as a side effect of one of her medications.  We advised the patient to start retaking her medication one at a time. We will start with half a dose of her Imdur at 60 mg rather than 120 mg. Patient is advised to stop Imdur if she has previous symptoms again after taking this medication and to call us.

## 2014-11-08 NOTE — Patient Instructions (Signed)
I am glad you are feeling better. As we discussed, let's start up one medication at a time to find out which one is giving you trouble. First, let's start Imdur 60 mg. This is half the dose you usually take, you can cut the pill in half. If you start feeling unwell again after taking Imdur, please discontinue it and call us.  Also, please try to call your insurance to have your primary care provider name changed to Loleta Chance, M.D.  Thank you for your visit.  We will follow up in 1-2 weeks.

## 2014-11-08 NOTE — Assessment & Plan Note (Signed)
After the patient discontinued her medications 2 days ago, she is feeling completely improved. She is not having any dizziness, facial numbness or tingling, or abnormal gait. Neuro exam is normal. Last visit we ordered MRI of Brain and MR Angiogram of neck as outpatient imaging, but these were not done. Apparently her insurance card has a PCP name of a doctor no longer working here, and this is why imaging was not scheduled. She called her insurance company to correct this information but there was no change.  We encouraged her to recall her insurance company with the name of her current PCP: Stephanie Gross, M.D. She agrees. We also advised her to have the imaging completed as well to be sure there was no TIA/Stroke type event that has resolved in the last week and she understands and agrees.

## 2014-11-08 NOTE — Progress Notes (Signed)
Patient ID: Stephanie Gross, female   DOB: 1945-05-28, 69 y.o.   MRN: [id number]   Subjective:   Patient ID: Stephanie Gross female   DOB: May 02, 1945 69 y.o.   MRN: [id number]  HPI: Stephanie Gross is a 69 y.o. female with PMH as listed below. She is seeing [place] today to follow up from one week ago with possible CVA, diarrhea, and HTN.  Please see problem based charting for details.   Past Medical History  Diagnosis Date  . Hypertension   . Diabetes mellitus without complication   . CAD (coronary artery disease)     s/p RCA stent 2007. Myoview 2010 normal  . DM (diabetes mellitus)   . HTN (hypertension)   . Hyperlipidemia   . GERD (gastroesophageal reflux disease)   . 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 Arthritis   . Obesity   . AVD (aortic valve disease)   . Vitamin D deficiency   . Chronic obstructive bronchitis without exacerbation   . Derangement of left knee   . Degeneration of meniscus of knee    Current Outpatient Prescriptions  Medication Sig Dispense Refill  . aspirin EC 81 MG tablet Take 81 mg by mouth daily.    . Blood Glucose Monitoring Suppl (AGAMATRIX PRESTO PRO METER) DEVI 1 Device by Does not apply route 4 (four) times daily. 1 Device 0  . carvedilol (COREG) 25 MG tablet TAKE ONE TABLET BY MOUTH TWICE DAILY WITH MEALS 60 tablet 5  . chlorthalidone (HYGROTON) 25 MG tablet TAKE ONE TABLET BY MOUTH ONCE DAILY 30 tablet 5  . clopidogrel (PLAVIX) 75 MG tablet Take 1 tablet (75 mg total) by mouth daily. 90 tablet 2  . glucose blood (AGAMATRIX PRESTO TEST) test strip Use as instructed 100 each 12  . glucose blood (ONE TOUCH ULTRA TEST) test strip Check blood sugar before and after meals and bedtime 250.00 insulin requiring 150 each 12  . insulin aspart (NOVOLOG FLEXPEN) 100 UNIT/ML FlexPen Inject 1U per carbohydrate and 1 units per 40mg /dL increment blood sugar over 140, 60-99 inject 2 units, 100-140  inject 3 units, 141-180 inject 4 units, 181-220 inject 5 units, 221-260 inject 6 units, 261-300 inject 7 units, 301-340 inject 8 units 15 mL 2  . insulin detemir (LEVEMIR) 100 UNIT/ML injection Inject 0.4 mLs (40 Units total) into the skin at bedtime. 10 mL 11  . isosorbide mononitrate (IMDUR) 120 MG 24 hr tablet Take 1 tablet (120 mg total) by mouth daily. 30 tablet 3  . lansoprazole (PREVACID) 30 MG capsule Take 1 capsule (30 mg total) by mouth daily at 12 noon. 30 capsule 1  . lisinopril (PRINIVIL,ZESTRIL) 40 MG tablet Take 1 tablet (40 mg total) by mouth 2 (two) times daily. 30 tablet 3   No current facility-administered medications for this visit.   Family History  Problem Relation Age of Onset  . Stroke Mother   . Diabetes Mother   . Diabetes Maternal Grandmother   . Diabetes Sister   . Hypertension Mother    History   Social History  . Marital Status: Single    Spouse Name: N/A  . Number of Children: N/A  . Years of Education: N/A   Occupational History  . unemployed    Social History Main Topics  . Smoking status: Current Every Day Smoker -- 0.30 packs/day for 30 years    Types: Cigarettes  . Smokeless tobacco: Never Used  Comment: cutting back / 1/2 PPD. stopped 05/28/2014  . Alcohol Use: No  . Drug Use: No  . Sexual Activity: Not on file   Other Topics Concern  . None   Social History Narrative   ** Merged History Encounter **       Patient recently lost her job and is currently without insurance. - Nov 2012   Review of Systems: Review of Systems  Constitutional: Negative for fever, chills, malaise/fatigue and diaphoresis.  HENT: Negative for congestion and sore throat.   Eyes: Negative for blurred vision.  Respiratory: Negative for cough, hemoptysis, sputum production, shortness of breath and wheezing.   Cardiovascular: Negative for chest pain, palpitations and leg swelling.  Gastrointestinal: Negative for nausea, vomiting, abdominal pain, diarrhea,  constipation and blood in stool.  Genitourinary: Negative for dysuria, urgency, frequency and hematuria.  Musculoskeletal: Negative for myalgias, joint pain and falls.  Neurological: Negative for dizziness, tingling, sensory change, speech change, weakness and headaches.     Objective:  Physical Exam: Filed Vitals:   11/08/14 1530  BP: 157/61  Pulse: 73  Temp: 98.4 F (36.9 C)  TempSrc: Oral  Height: 5\' 5"  (1.651 m)  Weight: 140 lb 6.4 oz (63.685 kg)  SpO2: 100%   Physical Exam  Constitutional: She is oriented to person, place, and time. She appears well-developed and well-nourished.  HENT:  Head: Normocephalic and atraumatic.  Eyes: Conjunctivae and EOM are normal. Pupils are equal, round, and reactive to light.  Neck: Normal range of motion.  Cardiovascular: Normal rate, regular rhythm and normal heart sounds.   Pulmonary/Chest: Effort normal and breath sounds normal. No respiratory distress. She has no wheezes.  Abdominal: Soft. Bowel sounds are normal.  Musculoskeletal: Normal range of motion. She exhibits no edema or tenderness.  Neurological: She is alert and oriented to person, place, and time. She has normal strength. Coordination and gait normal.     Assessment & Plan:  Please see problem based charting for current assessment and plan.

## 2014-11-08 NOTE — Progress Notes (Signed)
Internal Medicine Clinic Attending  I saw and evaluated the patient.  I personally confirmed the key portions of the history and exam documented by Dr. Zada Finders and I reviewed pertinent patient test results.  The assessment, diagnosis, and plan were formulated together and I agree with the documentation in the resident's note.  69 year old female with hypertension on multiple medications (Coreg 25, IMDUR 120, Lisinopril 80)who came in last clinic visit with symptoms suggestive of stroke, and dizziness, and was orthostatic. Outpatient MRI/MRA brain was ordered however it could not be scheduled because of a discrepancy in the PCP name on the patient's insurance card. The patient reports that she has called her insurance about that. We will investigate at our end. Further, the patient stopped taking IMDUR and Lisinopril and reports that all her symptoms improved. Today she is hypertensive to 157/61, however is asymptomatic. Recommend starting 60 mg Imdur and calling patient back in 7-10 days for reassessment (Patient does not want to take Lisinopril). Madilyn Fireman MD MPH 11/08/2014 4:39 PM

## 2014-11-23 ENCOUNTER — Other Ambulatory Visit: Payer: Self-pay | Admitting: Internal Medicine

## 2014-11-23 DIAGNOSIS — I639 Cerebral infarction, unspecified: Secondary | ICD-10-CM

## 2014-11-30 ENCOUNTER — Ambulatory Visit: Payer: Self-pay | Admitting: Internal Medicine

## 2014-12-04 ENCOUNTER — Other Ambulatory Visit (HOSPITAL_COMMUNITY): Payer: Self-pay

## 2014-12-04 ENCOUNTER — Ambulatory Visit (HOSPITAL_COMMUNITY): Payer: Medicare Other

## 2014-12-05 ENCOUNTER — Ambulatory Visit (HOSPITAL_COMMUNITY): Payer: Medicare Other

## 2014-12-07 ENCOUNTER — Ambulatory Visit (HOSPITAL_COMMUNITY): Payer: PRIVATE HEALTH INSURANCE

## 2014-12-07 ENCOUNTER — Inpatient Hospital Stay: Admission: AD | Admit: 2014-12-07 | Payer: Medicare Other | Source: Ambulatory Visit | Admitting: Internal Medicine

## 2014-12-07 ENCOUNTER — Ambulatory Visit (HOSPITAL_COMMUNITY)
Admission: RE | Admit: 2014-12-07 | Discharge: 2014-12-07 | Disposition: A | Discharge status: Home / Self Care | Payer: PRIVATE HEALTH INSURANCE | Source: Ambulatory Visit | Attending: Internal Medicine | Admitting: Internal Medicine

## 2014-12-07 ENCOUNTER — Telehealth: Payer: Self-pay | Admitting: Internal Medicine

## 2014-12-07 ENCOUNTER — Other Ambulatory Visit: Payer: Self-pay | Admitting: Internal Medicine

## 2014-12-07 DIAGNOSIS — I10 Essential (primary) hypertension: Secondary | ICD-10-CM | POA: Diagnosis not present

## 2014-12-07 DIAGNOSIS — E1165 Type 2 diabetes mellitus with hyperglycemia: Secondary | ICD-10-CM | POA: Diagnosis not present

## 2014-12-07 DIAGNOSIS — Z7902 Long term (current) use of antithrombotics/antiplatelets: Secondary | ICD-10-CM | POA: Diagnosis not present

## 2014-12-07 DIAGNOSIS — R200 Anesthesia of skin: Secondary | ICD-10-CM | POA: Insufficient documentation

## 2014-12-07 DIAGNOSIS — R42 Dizziness and giddiness: Secondary | ICD-10-CM

## 2014-12-07 DIAGNOSIS — F17210 Nicotine dependence, cigarettes, uncomplicated: Secondary | ICD-10-CM | POA: Diagnosis not present

## 2014-12-07 DIAGNOSIS — I639 Cerebral infarction, unspecified: Secondary | ICD-10-CM | POA: Insufficient documentation

## 2014-12-07 DIAGNOSIS — Z79899 Other long term (current) drug therapy: Secondary | ICD-10-CM | POA: Diagnosis not present

## 2014-12-07 DIAGNOSIS — Z7982 Long term (current) use of aspirin: Secondary | ICD-10-CM | POA: Diagnosis not present

## 2014-12-07 DIAGNOSIS — I2510 Atherosclerotic heart disease of native coronary artery without angina pectoris: Secondary | ICD-10-CM | POA: Diagnosis not present

## 2014-12-07 DIAGNOSIS — Z8673 Personal history of transient ischemic attack (TIA), and cerebral infarction without residual deficits: Secondary | ICD-10-CM | POA: Diagnosis not present

## 2014-12-07 DIAGNOSIS — Z794 Long term (current) use of insulin: Secondary | ICD-10-CM | POA: Diagnosis not present

## 2014-12-07 DIAGNOSIS — E785 Hyperlipidemia, unspecified: Secondary | ICD-10-CM | POA: Diagnosis not present

## 2014-12-07 NOTE — Telephone Encounter (Signed)
  Reason for call:   I placed an outgoing call to [name] at 7:45  PM regarding MRI findings.   Assessment/ Plan:   I informed patient that MRI shows new stroke and I would like her to come into the hospital tonight for further evaluation.  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  I asked if she had family or friends that could help her out.  She says she will speak with her husband and try and figure something out.  I again advised that she was at increased risk of future stroke and that these strokes could be debilitating.  She says she understands but cannot come in tonight.  I told her I would have a bed available if she is able to come in (after she talks to her husband).     Francesca Oman, DO   12/07/2014, 7:47 PM

## 2014-12-08 ENCOUNTER — Ambulatory Visit (HOSPITAL_COMMUNITY): Payer: Medicare Other

## 2014-12-08 ENCOUNTER — Telehealth: Payer: Self-pay | Admitting: Internal Medicine

## 2014-12-08 ENCOUNTER — Inpatient Hospital Stay (HOSPITAL_COMMUNITY): Payer: PRIVATE HEALTH INSURANCE

## 2014-12-08 ENCOUNTER — Inpatient Hospital Stay (HOSPITAL_COMMUNITY)
Admission: AD | Admit: 2014-12-08 | Discharge: 2014-12-10 | DRG: 066 | Disposition: A | Discharge status: Home / Self Care | Payer: PRIVATE HEALTH INSURANCE | Source: Ambulatory Visit | Attending: Internal Medicine | Admitting: Internal Medicine

## 2014-12-08 DIAGNOSIS — I6529 Occlusion and stenosis of unspecified carotid artery: Secondary | ICD-10-CM | POA: Diagnosis not present

## 2014-12-08 DIAGNOSIS — I639 Cerebral infarction, unspecified: Principal | ICD-10-CM | POA: Diagnosis present

## 2014-12-08 DIAGNOSIS — Z7902 Long term (current) use of antithrombotics/antiplatelets: Secondary | ICD-10-CM

## 2014-12-08 DIAGNOSIS — E1165 Type 2 diabetes mellitus with hyperglycemia: Secondary | ICD-10-CM | POA: Diagnosis not present

## 2014-12-08 DIAGNOSIS — Z7982 Long term (current) use of aspirin: Secondary | ICD-10-CM | POA: Diagnosis not present

## 2014-12-08 DIAGNOSIS — F17210 Nicotine dependence, cigarettes, uncomplicated: Secondary | ICD-10-CM | POA: Diagnosis not present

## 2014-12-08 DIAGNOSIS — Z79899 Other long term (current) drug therapy: Secondary | ICD-10-CM

## 2014-12-08 DIAGNOSIS — Z794 Long term (current) use of insulin: Secondary | ICD-10-CM

## 2014-12-08 DIAGNOSIS — Z8673 Personal history of transient ischemic attack (TIA), and cerebral infarction without residual deficits: Secondary | ICD-10-CM | POA: Diagnosis not present

## 2014-12-08 DIAGNOSIS — I10 Essential (primary) hypertension: Secondary | ICD-10-CM | POA: Diagnosis not present

## 2014-12-08 DIAGNOSIS — E1159 Type 2 diabetes mellitus with other circulatory complications: Secondary | ICD-10-CM | POA: Insufficient documentation

## 2014-12-08 DIAGNOSIS — Z720 Tobacco use: Secondary | ICD-10-CM | POA: Insufficient documentation

## 2014-12-08 DIAGNOSIS — E785 Hyperlipidemia, unspecified: Secondary | ICD-10-CM | POA: Diagnosis not present

## 2014-12-08 DIAGNOSIS — I2510 Atherosclerotic heart disease of native coronary artery without angina pectoris: Secondary | ICD-10-CM | POA: Diagnosis present

## 2014-12-08 DIAGNOSIS — I720 Aneurysm of carotid artery: Secondary | ICD-10-CM | POA: Diagnosis not present

## 2014-12-08 DIAGNOSIS — I6789 Other cerebrovascular disease: Secondary | ICD-10-CM | POA: Diagnosis not present

## 2014-12-08 LAB — GLUCOSE, CAPILLARY: Glucose-Capillary: 208 mg/dL — ABNORMAL HIGH (ref 65–99)

## 2014-12-08 LAB — CBC
HCT: 32.5 % — ABNORMAL LOW (ref 36.0–46.0)
Hemoglobin: 10.4 g/dL — ABNORMAL LOW (ref 12.0–15.0)
MCH: 29.2 pg (ref 26.0–34.0)
MCHC: 32 g/dL (ref 30.0–36.0)
MCV: 91.3 fL (ref 78.0–100.0)
Platelets: 240 10*3/uL (ref 150–400)
RBC: 3.56 MIL/uL — AB (ref 3.87–5.11)
RDW: 13.1 % (ref 11.5–15.5)
WBC: 7.3 10*3/uL (ref 4.0–10.5)

## 2014-12-08 MED ORDER — STROKE: EARLY STAGES OF RECOVERY BOOK
Freq: Once | Status: AC
  Administered 2014-12-08: 23:00:00

## 2014-12-08 MED ORDER — CLOPIDOGREL BISULFATE 75 MG PO TABS
75.0000 mg | ORAL_TABLET | Freq: Every day | ORAL | Status: DC
  Administered 2014-12-08 – 2014-12-10 (×3): 75 mg via ORAL
  Filled 2014-12-08 (×3): qty 1

## 2014-12-08 MED ORDER — INSULIN DETEMIR 100 UNIT/ML ~~LOC~~ SOLN
25.0000 [IU] | Freq: Every day | SUBCUTANEOUS | Status: DC
  Administered 2014-12-08 – 2014-12-09 (×2): 25 [IU] via SUBCUTANEOUS
  Filled 2014-12-08 (×3): qty 0.25

## 2014-12-08 MED ORDER — CHLORTHALIDONE 25 MG PO TABS
25.0000 mg | ORAL_TABLET | Freq: Every day | ORAL | Status: DC
  Administered 2014-12-08 – 2014-12-10 (×3): 25 mg via ORAL
  Filled 2014-12-08 (×3): qty 1

## 2014-12-08 MED ORDER — ENOXAPARIN SODIUM 40 MG/0.4ML ~~LOC~~ SOLN
40.0000 mg | SUBCUTANEOUS | Status: DC
  Administered 2014-12-08 – 2014-12-09 (×3): 40 mg via SUBCUTANEOUS
  Filled 2014-12-08 (×2): qty 0.4

## 2014-12-08 MED ORDER — ROSUVASTATIN CALCIUM 20 MG PO TABS
20.0000 mg | ORAL_TABLET | Freq: Every day | ORAL | Status: DC
  Administered 2014-12-09: 20 mg via ORAL
  Filled 2014-12-08 (×2): qty 1

## 2014-12-08 MED ORDER — ASPIRIN EC 81 MG PO TBEC
81.0000 mg | DELAYED_RELEASE_TABLET | Freq: Every day | ORAL | Status: DC
  Administered 2014-12-08 – 2014-12-10 (×3): 81 mg via ORAL
  Filled 2014-12-08 (×3): qty 1

## 2014-12-08 MED ORDER — CARVEDILOL 12.5 MG PO TABS
25.0000 mg | ORAL_TABLET | Freq: Two times a day (BID) | ORAL | Status: DC
  Administered 2014-12-08 – 2014-12-10 (×4): 25 mg via ORAL
  Filled 2014-12-08 (×5): qty 2

## 2014-12-08 MED ORDER — ISOSORBIDE MONONITRATE ER 30 MG PO TB24
120.0000 mg | ORAL_TABLET | Freq: Every day | ORAL | Status: DC
  Administered 2014-12-08: 120 mg via ORAL
  Filled 2014-12-08: qty 4

## 2014-12-08 MED ORDER — INSULIN ASPART 100 UNIT/ML ~~LOC~~ SOLN
0.0000 [IU] | Freq: Three times a day (TID) | SUBCUTANEOUS | Status: DC
  Administered 2014-12-09: 1 [IU] via SUBCUTANEOUS

## 2014-12-08 MED ORDER — PANTOPRAZOLE SODIUM 40 MG PO TBEC
40.0000 mg | DELAYED_RELEASE_TABLET | Freq: Every day | ORAL | Status: DC
  Administered 2014-12-08 – 2014-12-10 (×3): 40 mg via ORAL
  Filled 2014-12-08 (×3): qty 1

## 2014-12-08 MED ORDER — LISINOPRIL 20 MG PO TABS
40.0000 mg | ORAL_TABLET | Freq: Every day | ORAL | Status: DC
  Administered 2014-12-08 – 2014-12-10 (×3): 40 mg via ORAL
  Filled 2014-12-08 (×3): qty 2

## 2014-12-08 MED ORDER — INSULIN ASPART 100 UNIT/ML ~~LOC~~ SOLN
0.0000 [IU] | Freq: Every day | SUBCUTANEOUS | Status: DC
  Administered 2014-12-08: 2 [IU] via SUBCUTANEOUS

## 2014-12-08 NOTE — Telephone Encounter (Signed)
I placed an outgoing phone call to Stephanie Gross at 12:30pm on 12/08/2014 and spoke to her directly. Discussed her MRI results, which she states she has been informed of and was told to come the hospital for admission yesterday. She states that she was unable to at the time and was hesitant on coming to the hospital today as well. She was at work when I spoke to her and encouraged her to come to the hospital so we can evaluate her. She is somewhat resistant to coming to the hospital as her husband needs assistance with care as he has a recently amputated leg. She says that she will do her best to arrange someone to look after her husband and try to come to the hospital around 5 pm. I informed her that she does not have to come through the ED, but through admissions. She is also informed that there is a bed held for her, but will only be held for 2 hours during that timeframe. She understands and agrees to do her best to come to the hospital this evening.

## 2014-12-08 NOTE — H&P (Signed)
Date: 12/08/2014               Patient Name:  Stephanie Gross MRN: [id number]  DOB: December 07, 1945 Age / Sex: 69 y.o., female   PCP: Loleta Chance, MD         Medical Service: Internal Medicine Teaching Service         Attending Physician: Dr. Annia Belt, MD    First Contact: Dr. Burgess Estelle Pager: [phone]  Second Contact: Dr. Joni Reining Pager: [phone]       After Hours (After 5p/  First Contact Pager: [phone]  weekends / holidays): Second Contact Pager: [phone]   Chief Complaint: Evaluation for stroke findings on most recent MRI   History of Present Illness:  [name] is a 75 yo woman with notable PMH of CVA (last one in 2013), CAD, HTN, DM, who was called to the hospital for direct admission after her MRI results from 4 weeks ago came back suggestive of new stroke. She had come in on 7/06 with symptoms suggestive of stroke  That included morning dizziness, and change in her gait which her coworkers had noticed, and in the office she was found to have abnormal gait and orthostatics were abnormal. She was recommended to be admitted at that time, but she did not want to and so an outpatient MRI of the brain was ordered. She followed up 1 week later then said that since she stopped taking Imdur and lisinopril, all her symptoms improved, so imdur 60 mg was started again. Yesterday, her MRI results came back and it showed acute to subacute lacunar infarct with progressive small vessel ischemia, so Dr Oris Drone called in the patient for evaluation. Her last TIA was in 2013 in which Acute punctate non hemorrhagic infarct was present within the left thalamus.  Since seen last month, she does not report any new symptoms. But she mentions that she has lost taste and her left face feels numb for the past 1 month. She reports a weight loss of about 30 pounds over the last few years.  She reports morning headaches in the temporal region that has been ongoing for the past few  months, and the headaches get better after she walks around for few hours. She also reports intermittent diarrhea for few months- cdiff negative and she says it happens after she takes one of her meds. She denies n/v/cp/sob/dizziness or other symptoms.   She is compliant with her aspirin and plavix. She continues to smoke 1/2 ppd for many years. She has taken crestor in the past but was stopped due to cost issues.  For her HTN: she takes imdur 60 mg, and chlorthalidone.  she was previously on coreg 25, lisinopril 80 and imdur 120 but the imdur was cut in half due to the dizziness last time and the plan was to start the meds one at a time to find out the cause of her dizziness.  For her DM: she is on Levemir 25 units and novolog.      Meds:   Medication List    ASK your doctor about these medications        AGAMATRIX PRESTO PRO METER Devi  1 Device by Does not apply route 4 (four) times daily.     aspirin EC 81 MG tablet  Take 81 mg by mouth daily.     carvedilol 25 MG tablet  Commonly known as:  COREG  TAKE ONE TABLET BY MOUTH TWICE DAILY WITH MEALS  chlorthalidone 25 MG tablet  Commonly known as:  HYGROTON  TAKE ONE TABLET BY MOUTH ONCE DAILY     clopidogrel 75 MG tablet  Commonly known as:  PLAVIX  Take 1 tablet (75 mg total) by mouth daily.     glucose blood test strip  Commonly known as:  AGAMATRIX PRESTO TEST  Use as instructed     glucose blood test strip  Commonly known as:  ONE TOUCH ULTRA TEST  Check blood sugar before and after meals and bedtime 250.00 insulin requiring     insulin aspart 100 UNIT/ML FlexPen  Commonly known as:  NOVOLOG FLEXPEN  Inject 1U per carbohydrate and 1 units per 40mg /dL increment blood sugar over 140, 60-99 inject 2 units, 100-140 inject 3 units, 141-180 inject 4 units, 181-220 inject 5 units, 221-260 inject 6 units, 261-300 inject 7 units, 301-340 inject 8 units     insulin detemir 100 UNIT/ML injection  Commonly known as:   LEVEMIR  Inject 0.4 mLs (40 Units total) into the skin at bedtime.     isosorbide mononitrate 120 MG 24 hr tablet  Commonly known as:  IMDUR  Take 1 tablet (120 mg total) by mouth daily.     lansoprazole 30 MG capsule  Commonly known as:  PREVACID  Take 1 capsule (30 mg total) by mouth daily at 12 noon.     lisinopril 40 MG tablet  Commonly known as:  PRINIVIL,ZESTRIL  Take 1 tablet (40 mg total) by mouth 2 (two) times daily.        No current facility-administered medications for this encounter.   Current facility-administered medications:  .   stroke: mapping our early stages of recovery book, , Does not apply, Once, Lucious Groves, DO .  aspirin EC tablet 81 mg, 81 mg, Oral, Daily, Lucious Groves, DO .  carvedilol (COREG) tablet 25 mg, 25 mg, Oral, BID WC, Lucious Groves, DO .  chlorthalidone (HYGROTON) tablet 25 mg, 25 mg, Oral, Daily, Lucious Groves, DO .  clopidogrel (PLAVIX) tablet 75 mg, 75 mg, Oral, Daily, Lucious Groves, DO .  enoxaparin (LOVENOX) injection 40 mg, 40 mg, Subcutaneous, Q24H, Lucious Groves, DO .  insulin aspart (novoLOG) injection 0-5 Units, 0-5 Units, Subcutaneous, QHS, Lucious Groves, DO .  insulin aspart (novoLOG) injection 0-9 Units, 0-9 Units, Subcutaneous, TID WC, Lucious Groves, DO .  insulin detemir (LEVEMIR) injection 25 Units, 25 Units, Subcutaneous, QHS, Lucious Groves, DO .  isosorbide mononitrate (IMDUR) 24 hr tablet 120 mg, 120 mg, Oral, Daily, Lucious Groves, DO .  lisinopril (PRINIVIL,ZESTRIL) tablet 40 mg, 40 mg, Oral, Daily, Lucious Groves, DO .  pantoprazole (PROTONIX) EC tablet 40 mg, 40 mg, Oral, Daily, Lucious Groves, DO .  [START ON 12/09/2014] rosuvastatin (CRESTOR) tablet 20 mg, 20 mg, Oral, q1800, Lucious Groves, DO   Allergies: Allergies as of 12/08/2014 - Review Complete 12/08/2014  Allergen Reaction Noted  . Benazepril-hydrochlorothiazide Other (See Comments) 05/22/2014  . Chantix [varenicline]  02/13/2012  . Codeine  Nausea And Vomiting   . Methylprednisolone Other (See Comments)   . Statins Other (See Comments) 11/07/2013   Past Medical History  Diagnosis Date  . Hypertension   . Diabetes mellitus without complication   . CAD (coronary artery disease)     s/p RCA stent 2007. Myoview 2010 normal  . DM (diabetes mellitus)   . HTN (hypertension)   . Hyperlipidemia   . GERD (gastroesophageal reflux disease)   .  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 Arthritis   . Obesity   . AVD (aortic valve disease)   . Vitamin D deficiency   . Chronic obstructive bronchitis without exacerbation   . Derangement of left knee   . Degeneration of meniscus of knee    Past Surgical History  Procedure Laterality Date  . Cholecystectomy    . Coronary angioplasty with stent placement    . Abdominal hysterectomy    . Shoulder surgery    . Knee surgery     Family History  Problem Relation Age of Onset  . Stroke Mother   . Diabetes Mother   . Diabetes Maternal Grandmother   . Diabetes Sister   . Hypertension Mother    Social History   Social History  . Marital Status: Single    Spouse Name: N/A  . Number of Children: N/A  . Years of Education: N/A   Occupational History  . unemployed    Social History Main Topics  . Smoking status: Current Every Day Smoker -- 0.30 packs/day for 30 years    Types: Cigarettes  . Smokeless tobacco: Never Used     Comment: cutting back / 1/2 PPD. stopped 05/28/2014  . Alcohol Use: No  . Drug Use: No  . Sexual Activity: Not on file   Other Topics Concern  . Not on file   Social History Narrative   ** Merged History Encounter **       Patient recently lost her job and is currently without insurance. - Nov 2012    Review of Systems: Pertinent items are noted in HPI. Complete ROS was performed and pertinents in HPI  Physical Exam: Blood pressure 183/75, pulse 75, temperature 98.9 F (37.2 C), temperature source  Oral, resp. rate 18, SpO2 97 %.   General: A&O, in NAD HEENT: EOMI, PERRLA, sclera white, conjunctiva pink, no ear/nose/throat erythema Neck: supple, midline trachea, no cervical LAD, thyroid nonpalpable CV: RRR, normal s1, s2, no m/r/g, no carotid bruits appreciated, no JVD appreciated Resp: equal and symmetric breath sounds, no wheezing heard Abdomen: soft, nontender, nondistended, +BS in all 4 quadrants, no bruits appreciated, no masses, no HSM Skin: warm, dry, intact, no open lesions or rashes noted Extremities: pulses intact b/l, no edema, clubbing or cyanosis, no calf tenderness Neurologic:  CN II-XII grossly intact Sensory: intact to light touch in all extremities Motor: 5/5 strength in upper, 5/5 in lower extremities, no pronator drift Cerebellar: gait normal, romberg negative, normal finger to nose and heel to shin tests Psyc: pleasant personality, affect appropriate to mood, no focal deficits   Lab results: @LABTEST @  CBC Latest Ref Rng 11/01/2014 11/07/2013 02/07/2012  WBC 4.0 - 10.5 K/uL 6.7 8.1 10.7(H)  Hemoglobin 12.0 - 15.0 g/dL 11.7(L) 10.2(L) 12.2  Hematocrit 36.0 - 46.0 % 35.6(L) 33.5(L) 37.8  Platelets 150 - 400 K/uL 232 244 223    BMP Latest Ref Rng 11/07/2013 10/22/2012 07/09/2012  Glucose 70 - 99 mg/dL 93 139(H) 174(H)  BUN 6 - 23 mg/dL 16 28(H) 27(H)  Creatinine 0.50 - 1.10 mg/dL 1.00 1.17(H) 1.06  Sodium 137 - 147 mEq/L 144 141 137  Potassium 3.7 - 5.3 mEq/L 4.0 4.6 4.1  Chloride 96 - 112 mEq/L 108 109 108  CO2 19 - 32 mEq/L 24 24 23   Calcium 8.4 - 10.5 mg/dL 10.2 10.5 9.8   Hemoglobin a1c 7.1 in 08/2014  Lipid Panel     Component  Value Date/Time   CHOL 220* 09/14/2014 1617   TRIG 254* 09/14/2014 1617   HDL 51 09/14/2014 1617   CHOLHDL 4.3 09/14/2014 1617   VLDL 51* 09/14/2014 1617   LDLCALC 118* 09/14/2014 1617   LDLDIRECT 111* 09/15/2008 2047       Imaging results:  Mr Brain Wo Contrast  12/07/2014   CLINICAL DATA:  69 year old female with 4  months of left facial numbness and dizziness in the mornings. Initial encounter.  The patient declined IV contrast.  EXAM: MRI HEAD WITHOUT CONTRAST  TECHNIQUE: Multiplanar, multiecho pulse sequences of the brain and surrounding structures were obtained without intravenous contrast.  COMPARISON:  Brain MRI and intracranial MRA 02/07/2012, and earlier.  FINDINGS: Abnormal Patchy and confluent cerebral white matter T2 and FLAIR signal has progressed since 2013, with evidence of mildly progressed chronic infarcts affecting the bodyof the corpus callosum.  Furthermore, there is a T2 hyperintense lesion of the left splenium of the corpus callosum with restricted diffusion (series 4, image 25 and series 5, image 11). Associated T2 and FLAIR hyperintensity without mass effect or hemorrhage.  No other restricted diffusion identified. Major intracranial vascular flow voids are stable. T2 heterogeneity in the deep gray matter nuclei also has progressed and suggests chronic lacunar infarcts in the bilateral thalami. Similar mild increased in patchy T2 hyperintensity in the pons and evidence of a right pontine chronic lacune.  The cerebellum remains relatively spared. No cortical encephalomalacia or chronic cerebral blood products are identified.  No midline shift, mass effect, evidence of mass lesion, ventriculomegaly, extra-axial collection or acute intracranial hemorrhage. Cervicomedullary junction and pituitary are within normal limits. Negative visualized cervical spine.  Mild mastoid effusions are new. Negative visualized nasopharynx. Stable mild paranasal sinus mucosal thickening. Bone marrow signal is within normal limits. Orbit and scalp soft tissues appear stable and normal.  IMPRESSION: 1. Signal changes in the left splenium of the corpus callosum most compatible with an acute to subacute lacunar infarct in this patient with chronically advanced and progressive small vessel ischemia elsewhere. 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 2. No associated hemorrhage or mass effect. These results will be called to the ordering clinician or representative by the Radiologist Assistant, and communication documented in the PACS or zVision Dashboard.   Electronically Signed   By: Genevie Ann M.D.   On: 12/07/2014 17:32    Other results: EKG: normal EKG, normal sinus rhythm, unchanged from previous tracings.  Assessment & Plan by Problem: Active Problems:   * No active hospital problems. *   MRI finds of lacunar infarct: Physical exam did not reveal any focal deficits. Her loss of taste and left sided numbness is puzzling. we will get a full stroke evaluation and Neurology consult. Her previous carotid doppler was in 01/2012 and it was unremarkable with minimal stenosis.   -ordered MRA brain without contrast -Carotid dopplers -PT/OT and swallow eval -Neurology consult -Started crestor 20 mg -aspirin and plavix 75 -Neuro checks q2 for 12 hours than q4   HTN: BP was elevated at 183/75 but per patient, it has been like this for some time Started home meds of lisinopril 40, chlorthalidone 25, and coreg 25, and imdur 120  T2DM: -On Levemir 25 units -SSI -check A1C  #FEN:  -Diet: carb modified  #DVT prophylaxis: Lovemox 40 mg  #CODE STATUS: full code   Dispo: Disposition is deferred at this time, awaiting improvement of current medical problems. Anticipated discharge in  approximately 3 day(s).   The patient does have a current PCP Loleta Chance, MD) and does need an [facility] hospital follow-up appointment after discharge.  The patient does not have transportation limitations that hinder transportation to clinic appointments.  Signed: Burgess Estelle, MD 12/08/2014, 6:19 PM

## 2014-12-08 NOTE — Progress Notes (Signed)
Pt admitted to 4N25. Pt is stable. Internal medicine teaching service was paged.

## 2014-12-08 NOTE — Consult Note (Signed)
Neurology Consultation Reason for Consult: Stroke Referring Physician: Virgina Jock, L  CC: Stroke  History is obtained from: Patient, husband  HPI: Stephanie Gross is a 69 y.o. female with a history of approximate one-month of unsteadiness, facial numbness who presents to the internal medicine clinic 1 month ago and an MRI was ordered patient refused admission at that time. She had an MRI which demonstrated an acute posterior corpus callosum infarct. Note she has a history of cerebral vascular disease.  She states since that time, she has continued to be unsteady and have facial numbness without much change.  Due to concerns about the patient needing workup in uncertainty of follow-up was decided to admit her to expedite secondary stroke prevention measures. Neurology has been consulted for assistance.  LKW: One month ago tpa given?: no, outside of window    ROS: A 14 point ROS was performed and is negative except as noted in the HPI.   Past Medical History  Diagnosis Date  . Hypertension   . Diabetes mellitus without complication   . CAD (coronary artery disease)     s/p RCA stent 2007. Myoview 2010 normal  . DM (diabetes mellitus)   . HTN (hypertension)   . Hyperlipidemia   . GERD (gastroesophageal reflux disease)   . 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 Arthritis   . Obesity   . AVD (aortic valve disease)   . Vitamin D deficiency   . Chronic obstructive bronchitis without exacerbation   . Derangement of left knee   . Degeneration of meniscus of knee     Family History: Stroke  Social History: Tob: Current smoker (I have strongly encouraged the patient to quit.)  Exam: Current vital signs: BP 183/75 mmHg  Pulse 75  Temp(Src) 98.9 F (37.2 C) (Oral)  Resp 18  SpO2 97% Vital signs in last 24 hours: Temp:  [98.9 F (37.2 C)] 98.9 F (37.2 C) (08/12 1804) Pulse Rate:  [75] 75 (08/12 1804) Resp:  [18] 18  (08/12 1804) BP: (183)/(75) 183/75 mmHg (08/12 1804) SpO2:  [97 %] 97 % (08/12 1804)   Physical Exam  Constitutional: Appears well-developed and well-nourished.  Psych: Affect appropriate to situation Eyes: No scleral injection HENT: No OP obstrucion Head: Normocephalic.  Cardiovascular: Normal rate and regular rhythm.  Respiratory: Effort normal and breath sounds normal to anterior ascultation GI: Soft.  No distension. There is no tenderness.  Skin: WDI  Neuro: Mental Status: Patient is awake, alert, oriented to person, place, month, year, and situation. Patient is able to give a clear and coherent history. No signs of aphasia or neglect Cranial Nerves: II: Visual Fields are full. Pupils are equal, round, and reactive to light.   III,IV, VI: EOMI without ptosis or diploplia.  V: Facial sensation is decreased on left VII: Facial movement is symmetric.  VIII: hearing is intact to voice X: Uvula elevates symmetrically XI: Shoulder shrug is symmetric. XII: tongue is midline without atrophy or fasciculations.  Motor: Tone is normal. Bulk is normal. 5/5 strength was present in all four extremities.  Sensory: Sensation is symmetric to light touch and temperature in the arms and legs. Cerebellar: FNF and HKS are intact on the right, but she has dysmetria in both left arm and leg.    I have reviewed labs in epic and the results pertinent to this consultation are: Elevated blood glucose  I have reviewed the images obtained: MRI brain-subacute infarct in the  left posterior corpus callosum, also has chronic infarct in the right pons  Impression: 69 year old female with four-month history of unsteadiness and left-sided ataxia. I do wonder if the right pons lesion was actually responsible for her symptoms given that this seems to correspond better to her symptoms then the corpus callosal lesion. In any case, she clearly needs to modify her stroke risk factors for most among these would  be smoking cessation which I have strongly encouraged to her.  Recommendations: 1. HgbA1c, fasting lipid panel 2. MRA  of the brain without contrast 3. Frequent neuro checks 4. Echocardiogram 5. Carotid dopplers 6. Prophylactic therapy-on dual antiplatelet at home, would not modify at this time.  7. Risk factor modification 8. Telemetry monitoring 9. PT consult, OT consult, Speech consult    Roland Rack, MD [facility] [phone]  If 7pm- 7am, please page neurology on call as listed in [place].

## 2014-12-09 ENCOUNTER — Other Ambulatory Visit (HOSPITAL_COMMUNITY): Payer: Self-pay

## 2014-12-09 ENCOUNTER — Inpatient Hospital Stay (HOSPITAL_COMMUNITY): Payer: PRIVATE HEALTH INSURANCE

## 2014-12-09 DIAGNOSIS — E785 Hyperlipidemia, unspecified: Secondary | ICD-10-CM | POA: Insufficient documentation

## 2014-12-09 DIAGNOSIS — E1159 Type 2 diabetes mellitus with other circulatory complications: Secondary | ICD-10-CM | POA: Insufficient documentation

## 2014-12-09 DIAGNOSIS — I639 Cerebral infarction, unspecified: Secondary | ICD-10-CM

## 2014-12-09 DIAGNOSIS — I10 Essential (primary) hypertension: Secondary | ICD-10-CM

## 2014-12-09 DIAGNOSIS — Z720 Tobacco use: Secondary | ICD-10-CM

## 2014-12-09 DIAGNOSIS — I6789 Other cerebrovascular disease: Secondary | ICD-10-CM

## 2014-12-09 LAB — LIPID PANEL
CHOL/HDL RATIO: 5.2 ratio
CHOL/HDL RATIO: 4.8 ratio
Cholesterol: 190 mg/dL (ref 0–200)
Cholesterol: 193 mg/dL (ref 0–200)
HDL: 37 mg/dL — ABNORMAL LOW (ref >40)
HDL: 40 mg/dL — ABNORMAL LOW (ref >40)
LDL Cholesterol: 123 mg/dL — ABNORMAL HIGH (ref 0–99)
LDL Cholesterol: 128 mg/dL — ABNORMAL HIGH (ref 0–99)
TRIGLYCERIDES: 142 mg/dL (ref <150)
TRIGLYCERIDES: 134 mg/dL (ref <150)
VLDL: 28 mg/dL (ref 0–40)
VLDL: 27 mg/dL (ref 0–40)

## 2014-12-09 LAB — GLUCOSE, CAPILLARY
GLUCOSE-CAPILLARY: 121 mg/dL — AB (ref 65–99)
GLUCOSE-CAPILLARY: 106 mg/dL — AB (ref 65–99)
GLUCOSE-CAPILLARY: 170 mg/dL — AB (ref 65–99)
Glucose-Capillary: 89 mg/dL (ref 65–99)

## 2014-12-09 LAB — BASIC METABOLIC PANEL
Anion gap: 6 (ref 5–15)
BUN: 24 mg/dL — AB (ref 6–20)
CHLORIDE: 111 mmol/L (ref 101–111)
CO2: 24 mmol/L (ref 22–32)
Calcium: 9.2 mg/dL (ref 8.9–10.3)
Creatinine, Ser: 1.73 mg/dL — ABNORMAL HIGH (ref 0.44–1.00)
GFR calc Af Amer: 34 mL/min — ABNORMAL LOW (ref >60)
GFR calc non Af Amer: 29 mL/min — ABNORMAL LOW (ref >60)
Glucose, Bld: 87 mg/dL (ref 65–99)
Potassium: 4 mmol/L (ref 3.5–5.1)
Sodium: 141 mmol/L (ref 135–145)

## 2014-12-09 LAB — HEMOGLOBIN A1C
HEMOGLOBIN A1C: 7 % — AB (ref 4.8–5.6)
MEAN PLASMA GLUCOSE: 154 mg/dL

## 2014-12-09 MED ORDER — ROSUVASTATIN CALCIUM 20 MG PO TABS: 20.0000 mg | ORAL_TABLET | Freq: Every day | ORAL | Status: DC

## 2014-12-09 MED ORDER — ISOSORBIDE MONONITRATE ER 30 MG PO TB24
60.0000 mg | ORAL_TABLET | Freq: Every day | ORAL | Status: DC
  Administered 2014-12-09 – 2014-12-10 (×2): 60 mg via ORAL
  Filled 2014-12-09 (×2): qty 2

## 2014-12-09 MED ORDER — ISOSORBIDE MONONITRATE ER 60 MG PO TB24: 60.0000 mg | ORAL_TABLET | Freq: Every day | ORAL | Status: DC

## 2014-12-09 NOTE — Progress Notes (Signed)
VASCULAR LAB PRELIMINARY  PRELIMINARY  PRELIMINARY  PRELIMINARY  Carotid duplex completed.    Preliminary report:  Bilateral:  1-39% ICA stenosis.  Vertebral artery flow is antegrade.     Stephanie Gross, RVS 12/09/2014, 2:00 PM

## 2014-12-09 NOTE — Progress Notes (Signed)
Pt advised of discharge orders this evening. She requested more information on pending tests as discussed by neurologist prior to discharge and would like to wait until this is resolved in the morning.

## 2014-12-09 NOTE — Progress Notes (Addendum)
Subjective: No acute events overnight Pt able to tolerate food She mentions persistent dizziness when she wakes up in AM and feeling off-balance,  But her main complaint is temporal and occipital pain in her head that resolves after few hours. She does not feel like the room is spinning and she takes adequate hydration.  She mentions taking 120 mg IMDUR, and all her BP meds at home, but per chart review, some of them were temporarily held last month by Dr. Ellwood Dense due to her persistent dizziness and her imdur was cut in half  We are unsure of what the dizziness might be due to as her history does not sound like BPPV, and her MRA does not show any new findings, it may be due to polypharmacy or orthostatics. She does not mention that she was ever hypoglycemic   We will d/c her home on 60 mg of indur and continue her rest of the meds/ Objective: Vital signs in last 24 hours: Filed Vitals:   12/09/14 0200 12/09/14 0400 12/09/14 0602 12/09/14 0849  BP: 134/63 131/59 162/68 144/62  Pulse: 65 67 67 68  Temp: 98.5 F (36.9 C) 98.5 F (36.9 C) 98.6 F (37 C)   TempSrc: Oral Oral Oral Oral  Resp: 15 16  20   SpO2: 97% 97% 100% 100%   Weight change:   Intake/Output Summary (Last 24 hours) at 12/09/14 0944 Last data filed at 12/09/14 0833  Gross per 24 hour  Intake    240 ml  Output      0 ml  Net    240 ml   General: A&O, in NAD HEENT: EOMI, PERRLA, sclera white, conjunctiva pink, no ear/nose/throat erythema Neck: supple, midline trachea, no cervical LAD, thyroid nonpalpable CV: RRR, normal s1, s2, no m/r/g, Resp: equal and symmetric breath sounds, no wheezing heard Abdomen: soft, nontender, nondistended, +BS in all 4 quadrants, no bruits appreciated, no masses, no HSM Skin: warm, dry, intact, no open lesions or rashes noted Extremities: pulses intact b/l, no edema, clubbing or cyanosis, no calf tenderness Neurologic: Unchanged from yesterday, no focal neuro deficits  Lab  Results: CBC Latest Ref Rng 12/08/2014 11/01/2014 11/07/2013  WBC 4.0 - 10.5 K/uL 7.3 6.7 8.1  Hemoglobin 12.0 - 15.0 g/dL 10.4(L) 11.7(L) 10.2(L)  Hematocrit 36.0 - 46.0 % 32.5(L) 35.6(L) 33.5(L)  Platelets 150 - 400 K/uL 240 232 244    BMP Latest Ref Rng 12/09/2014 11/07/2013 10/22/2012  Glucose 65 - 99 mg/dL 87 93 139(H)  BUN 6 - 20 mg/dL 24(H) 16 28(H)  Creatinine 0.44 - 1.00 mg/dL 1.73(H) 1.00 1.17(H)  Sodium 135 - 145 mmol/L 141 144 141  Potassium 3.5 - 5.1 mmol/L 4.0 4.0 4.6  Chloride 101 - 111 mmol/L 111 108 109  CO2 22 - 32 mmol/L 24 24 24   Calcium 8.9 - 10.3 mg/dL 9.2 10.2 10.5    Lipid Panel     Component Value Date/Time   CHOL 190 12/09/2014 0612   TRIG 134 12/09/2014 0612   HDL 40* 12/09/2014 0612   CHOLHDL 4.8 12/09/2014 0612   VLDL 27 12/09/2014 0612   LDLCALC 123* 12/09/2014 0612   LDLDIRECT 111* 09/15/2008 2047   A1C 7.0  Micro Results: No results found for this or any previous visit (from the past 240 hour(s)). 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 Facial numbness and dizziness.  EXAM: MRA NECK WITHOUT CONTRAST  MRA HEAD WITHOUT CONTRAST  TECHNIQUE: Angiographic images of the neck were obtained using MRA technique without intravenous contast. Angiographic images of the Circle of Willis were obtained using MRA technique without intravenous contrast.  COMPARISON:  Head MRA 02/07/2012  FINDINGS: MRA NECK FINDINGS  Common carotid and cervical internal carotid arteries are patent without evidence of significant stenosis. Vertebral arteries are patent with the left being mildly dominant and with antegrade flow bilaterally. No vertebral artery stenosis is seen.  MRA HEAD FINDINGS  The visualized distal vertebral arteries are patent with the left being mildly dominant. AICA and SCA origins are patent bilaterally. Basilar artery is patent without stenosis. Posterior  communicating arteries are not clearly identified. PCAs are patent with minimal branch vessel irregularity but no proximal stenosis.  Internal carotid arteries are patent from skullbase to carotid termini. There is atherosclerotic irregularity of both cavernous carotids with mild stenosis bilaterally, not significantly changed. 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  MCAs and ACAs are patent without proximal stenosis. Mild bilateral ACA and right MCA branch vessel irregularity and moderate irregularity and narrowing of multiple left MCA branch vessels do not appear significantly changed.  IMPRESSION: 1. No cervical carotid or vertebral artery stenosis. 2. No major intracranial arterial occlusion. 3. Mild stenosis of the cavernous carotid arteries, unchanged. 4. Unchanged, 3 mm left cavernous carotid aneurysm.   Electronically Signed   By: Logan Bores   On: 12/08/2014 22:17   Mr Brain Wo Contrast  12/07/2014   CLINICAL DATA:  69 year old female with 4 months of left facial numbness and dizziness in the mornings. Initial encounter.  The patient declined IV contrast.  EXAM: MRI HEAD WITHOUT CONTRAST  TECHNIQUE: Multiplanar, multiecho pulse sequences of the brain and surrounding structures were obtained without intravenous contrast.  COMPARISON:  Brain MRI and intracranial MRA 02/07/2012, and earlier.  FINDINGS: Abnormal Patchy and confluent cerebral white matter T2 and FLAIR signal has progressed since 2013, with evidence of mildly progressed chronic infarcts affecting the bodyof the corpus callosum.  Furthermore, there is a T2 hyperintense lesion of the left splenium of the corpus callosum with restricted diffusion (series 4, image 25 and series 5, image 11). Associated T2 and FLAIR hyperintensity without mass effect or hemorrhage.  No other restricted diffusion identified. Major intracranial vascular flow voids are stable. T2 heterogeneity in the deep gray matter nuclei also has progressed and  suggests chronic lacunar infarcts in the bilateral thalami. Similar mild increased in patchy T2 hyperintensity in the pons and evidence of a right pontine chronic lacune.  The cerebellum remains relatively spared. No cortical encephalomalacia or chronic cerebral blood products are identified.  No midline shift, mass effect, evidence of mass lesion, ventriculomegaly, extra-axial collection or acute intracranial hemorrhage. Cervicomedullary junction and pituitary are within normal limits. Negative visualized cervical spine.  Mild mastoid effusions are new. Negative visualized nasopharynx. Stable mild paranasal sinus mucosal thickening. Bone marrow signal is within normal limits. Orbit and scalp soft tissues appear stable and normal.  IMPRESSION: 1. Signal changes in the left splenium of the corpus callosum most compatible with an acute to subacute lacunar infarct in this patient with chronically advanced and progressive small vessel ischemia elsewhere. Lacunar infarcts of the body of the corpus callosum have also progressed since 2013. 2. No associated hemorrhage or mass effect. These results will be called to the ordering clinician or representative by the Radiologist Assistant, and communication documented in the PACS or zVision Dashboard.   Electronically Signed   By:  Genevie Ann M.D.   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 Facial numbness and dizziness.  EXAM: MRA NECK WITHOUT CONTRAST  MRA HEAD WITHOUT CONTRAST  TECHNIQUE: Angiographic images of the neck were obtained using MRA technique without intravenous contast. Angiographic images of the Circle of Willis were obtained using MRA technique without intravenous contrast.  COMPARISON:  Head MRA 02/07/2012  FINDINGS: MRA NECK FINDINGS  Common carotid and cervical internal carotid arteries are patent without evidence of significant stenosis. Vertebral  arteries are patent with the left being mildly dominant and with antegrade flow bilaterally. No vertebral artery stenosis is seen.  MRA HEAD FINDINGS  The visualized distal vertebral arteries are patent with the left being mildly dominant. AICA and SCA origins are patent bilaterally. Basilar artery is patent without stenosis. Posterior communicating arteries are not clearly identified. PCAs are patent with minimal branch vessel irregularity but no proximal stenosis.  Internal carotid arteries are patent from skullbase to carotid termini. There is atherosclerotic irregularity of both cavernous carotids with mild stenosis bilaterally, not significantly changed. 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  MCAs and ACAs are patent without proximal stenosis. Mild bilateral ACA and right MCA branch vessel irregularity and moderate irregularity and narrowing of multiple left MCA branch vessels do not appear significantly changed.  IMPRESSION: 1. No cervical carotid or vertebral artery stenosis. 2. No major intracranial arterial occlusion. 3. Mild stenosis of the cavernous carotid arteries, unchanged. 4. Unchanged, 3 mm left cavernous carotid aneurysm.   Electronically Signed   By: Logan Bores   On: 12/08/2014 22:17   Medications: I have reviewed the patient's current medications. Scheduled Meds: . aspirin EC  81 mg Oral Daily  . carvedilol  25 mg Oral BID WC  . chlorthalidone  25 mg Oral Daily  . clopidogrel  75 mg Oral Daily  . enoxaparin (LOVENOX) injection  40 mg Subcutaneous Q24H  . insulin aspart  0-5 Units Subcutaneous QHS  . insulin aspart  0-9 Units Subcutaneous TID WC  . insulin detemir  25 Units Subcutaneous QHS  . isosorbide mononitrate  60 mg Oral Daily  . lisinopril  40 mg Oral Daily  . pantoprazole  40 mg Oral Daily  . rosuvastatin  20 mg Oral q1800   Continuous Infusions:  PRN Meds:. Assessment/Plan: Active Problems:   Acute CVA (cerebrovascular accident) MRI finds of acute  to subacute lacunar infarct: She is clearly out of the timeframe for tPA but we wanted the pt to have full stroke workup and so we called her to admit her. Physical exam did not reveal any focal deficits. Her loss of taste and left sided numbness is puzzling. we will get a full stroke evaluation and Neurology consult. Her previous carotid doppler was in 01/2012 and it was unremarkable with minimal stenosis.   -MRA of brain was done yesterday and it shows No cervical carotid or vertebral artery stenosis, No major intracranial arterial occlusion, Mild stenosis of the cavernous carotid arteries, unchanged, and Unchanged, 3 mm left cavernous carotid aneurysm.  -Neurology was consulted and they do not have anything new to add to the recs apart from continuing both aspirin and plavix- appreciate their recs. -Carotid dopplers- pending -PT/OT and swallow eval -Started crestor 20 mg- Pt said she was able to tolerate it. -aspirin and plavix 75 -Neuro checks q2 for 12 hours than  q4   HTN: BP was in the 130s/60s after restarting her meds- it may be too low for her and that might be the reason for dizziness but her dizziness has been chronic   Continue On home meds of lisinopril 40, chlorthalidone 25, and coreg 25, and imdur 120 -May cut imdur to half  T2DM: blood sugars have been in the 208-87 range (this morning 87) -On Levemir 40 units at home, decreased to 25 units on admission -SSI -check A1C- came back at 7  #FEN:  -Diet: carb modified  #DVT prophylaxis: Lovemox 40 mg  #CODE STATUS: full code   Dispo: Disposition is deferred at this time, awaiting improvement of current medical problems.  Anticipated discharge in approximately 0 day(s).   The patient does have a current PCP Loleta Chance, MD) and does need an [facility] hospital follow-up appointment after discharge.  The patient does not have transportation limitations that hinder transportation to clinic appointments.  .Services Needed at time  of discharge: Y = Yes, Blank = No PT:   OT:   RN:   Equipment:   Other:     LOS: 1 day   Burgess Estelle, MD 12/09/2014, 9:44 AM

## 2014-12-09 NOTE — Progress Notes (Signed)
STROKE TEAM PROGRESS NOTE   HISTORY Stephanie Gross is a 69 y.o. female with a history of approximate one-month of unsteadiness, facial numbness who presents to the internal medicine clinic 1 month ago and an MRI was ordered patient refused admission at that time. She had an MRI which demonstrated an acute posterior corpus callosum infarct. Note she has a history of cerebral vascular disease.  She states since that time, she has continued to be unsteady and have facial numbness without much change.  Due to concerns about the patient needing workup in uncertainty of follow-up was decided to admit her to expedite secondary stroke prevention measures. Neurology has been consulted for assistance.  LKW: One month ago tpa given?: no, outside of window   SUBJECTIVE (INTERVAL HISTORY) Her son and Husband is at the bedside.  Overall she feels her condition is unchanged. Patient complains of prominent occipital headaches which seems to precipitate the symptoms of facial numbness and dysesthesia on the left side. She also has dizziness and gait ataxia with these spells. The symptoms are worse in the morning time on awakening. They last about 30 minutes.   OBJECTIVE Temp:  [98 F (36.7 C)-98.9 F (37.2 C)] 98.6 F (37 C) (08/13 0602) Pulse Rate:  [65-82] 67 (08/13 0602) Cardiac Rhythm:  [-] Normal sinus rhythm (08/12 1937) Resp:  [14-18] 16 (08/13 0400) BP: (117-200)/(57-75) 162/68 mmHg (08/13 0602) SpO2:  [97 %-100 %] 100 % (08/13 0602)   Recent Labs Lab 12/08/14 2143 12/09/14 0637  GLUCAP 208* 89    Recent Labs Lab 12/09/14 0603  NA 141  K 4.0  CL 111  CO2 24  GLUCOSE 87  BUN 24*  CREATININE 1.73*  CALCIUM 9.2   No results for input(s): AST, ALT, ALKPHOS, BILITOT, PROT, ALBUMIN in the last 168 hours.  Recent Labs Lab 12/08/14 1853  WBC 7.3  HGB 10.4*  HCT 32.5*  MCV 91.3  PLT 240   No results for input(s): CKTOTAL, CKMB, CKMBINDEX, TROPONINI in the last 168  hours. No results for input(s): LABPROT, INR in the last 72 hours. No results for input(s): COLORURINE, LABSPEC, [place], GLUCOSEU, HGBUR, BILIRUBINUR, KETONESUR, PROTEINUR, UROBILINOGEN, NITRITE, LEUKOCYTESUR in the last 72 hours.  Invalid input(s): APPERANCEUR     Component Value Date/Time   CHOL 190 12/09/2014 0612   TRIG 134 12/09/2014 0612   HDL 40* 12/09/2014 0612   CHOLHDL 4.8 12/09/2014 0612   VLDL 27 12/09/2014 0612   LDLCALC 123* 12/09/2014 0612   Lab Results  Component Value Date   HGBA1C 7.0* 12/08/2014      Component Value Date/Time   LABOPIA NONE DETECTED 02/07/2012 1205   COCAINSCRNUR NONE DETECTED 02/07/2012 1205   LABBENZ NONE DETECTED 02/07/2012 1205   AMPHETMU NONE DETECTED 02/07/2012 1205   THCU NONE DETECTED 02/07/2012 1205   LABBARB NONE DETECTED 02/07/2012 1205    No results for input(s): ETH in the last 168 hours.   IMAGING  Mr Angiogram Neck Wo Contrast 12/08/2014    1. No cervical carotid or vertebral artery stenosis.  2. No major intracranial arterial occlusion.  3. Mild stenosis of the cavernous carotid arteries, unchanged.  4. Unchanged, 3 mm left cavernous carotid aneurysm.       Mr [name] Head/brain Wo Cm 12/08/2014    1. No cervical carotid or vertebral artery stenosis.  2. No major intracranial arterial occlusion.  3. Mild stenosis of the cavernous carotid arteries, unchanged.  4. Unchanged, 3 mm left cavernous carotid aneurysm.  Mr Brain Wo Contrast 12/07/2014    1. Signal changes in the left splenium of the corpus callosum most compatible with an acute to subacute lacunar infarct in this patient with chronically advanced and progressive small vessel ischemia elsewhere. Lacunar infarcts of the body of the corpus callosum have also progressed since 2013.  2. No associated hemorrhage or mass effect.       PHYSICAL EXAM  GENERAL: Thin pleasant female in no acute distress.  HEENT: Symptoms soreness palpation involving the  occipital regions bilaterally especially on the right side.   ABDOMEN: soft  EXTREMITIES: No edema   BACK:Normal  SKIN: Normal by inspection.    MENTAL STATUS: Alert and oriented. Speech, language and cognition are generally intact. Judgment and insight normal.   CRANIAL NERVES: Pupils are equal, round and reactive to light and accomodation; extra ocular movements are full, there is no significant nystagmus; visual fields are full; upper and lower facial muscles are normal in strength and symmetric, there is no flattening of the nasolabial folds; tongue is midline; uvula is midline; shoulder elevation is normal.  MOTOR: Normal tone, bulk and strength; no pronator drift.  COORDINATION: Left finger to nose is normal, right finger to nose is normal, No rest tremor; no intention tremor; no postural tremor; no bradykinesia.   SENSATION: Normal to light touch.  GAIT: Normal.      Brain MRI scan is reviewed in person. There is increased signal seen on diffusion images involving the corpus callosum splenium on the left side. There is no reduced signal on the ADC scan suggesting some chronicity. 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 There are a few tiny lucencies involving the thalamic and basal ganglia bilaterally suggestive of lacunar infarcts. There is moderate global atrophy.     ASSESSMENT/PLAN Stephanie Gross is a 69 y.o. female with history of hypertension, diabetes mellitus, coronary artery disease, hyperlipidemia, previous CVA, aortic valve disease, cerebrovascular disease, ongoing tobacco use, and COPD, presenting with unsteady gait and facial numbness. She did not receive IV t-PA due to late presentation. Symptoms are associated with prominent headaches.  Stroke: Dominant infarct secondary to small vessel disease.  Resultant  Gait ataxia and dizziness.  MRI  acute to subacute lacunar infarct left splenium of the corpus callosum.  MRA   No  major intracranial arterial occlusion. Unchanged, 3 mm left cavernous carotid aneurysm.  Carotid Doppler Bilateral: 1-39% ICA stenosis. Vertebral artery flow is antegrade.   2D Echo EF 65-70%. No cardiac source of emboli identified.  LDL 123  HgbA1c 7.0  Lovenox for VTE prophylaxis  Diet Carb Modified Fluid consistency:: Thin; Room service appropriate?: Yes  aspirin 81 mg orally every day and clopidogrel 75 mg orally every day prior to admission, now on aspirin 81 mg orally every day and clopidogrel 75 mg orally every day  Patient counseled to be compliant with her antithrombotic medications  Ongoing aggressive stroke risk factor management  Therapy recommendations: No follow-up physical therapy recommended.  Disposition:  Pending  Hypertension  Home meds: Carvedilol, lisinopril, and chlorthalidone  Stable  Patient counseled to be compliant with her blood pressure medications  Hyperlipidemia  Home meds:  No lipid lowering medications prior to admission.  LDL 123, goal < 70  Statin allergy - consider PCSK9 inhibitor  Continue statin at discharge  Diabetes  HgbA1c 7.0, goal < 7.0  Uncontrolled  Other Stroke Risk Factors  Advanced age  Cigarette smoker, advised to stop smoking  Obesity, There is no weight on file  to calculate BMI.   Hx stroke/TIA  Family hx stroke (mother)  Coronary artery disease   Other Active Problems  Renal insufficiency  Anemia  Prominent headaches of unclear etiology.  PLAN  Workup complete for stroke.   Would suggest sedimentation rate and C-reactive protein along with other labs given the persistent headaches.  Follow-up with Dr. Leonie Man in 2 months.  Continue aspirin and Plavix at current doses.    Hospital day # 1  Mikey Bussing [facility] Pager [phone] 12/09/2014, 4:08 PM     To contact Stroke Continuity provider, please refer to [email or web address] After hours, contact General  NeurologyAnd

## 2014-12-09 NOTE — Care Management Note (Signed)
Case Management Note  Patient Details  Name: Stephanie Gross MRN: [id number] Date of Birth: Mar 31, 1946  Subjective/Objective:                    Action/Plan:   Expected Discharge Date:                  Expected Discharge Plan:     In-House Referral:     Discharge planning Services     Post Acute Care Choice:    Choice offered to:     DME Arranged:    DME Agency:     HH Arranged:    [place] Agency:     Status of Service:     Medicare Important Message Given:   yes Date Medicare IM Given:   12/09/14 Medicare IM give by:   Frann Rider, RN, BSN Date Additional Medicare IM Given:    Additional Medicare Important Message give by:     If discussed at [place] of Stay Meetings, dates discussed:    Additional Comments:  Norina Buzzard, RN 12/09/2014, 3:53 PM

## 2014-12-09 NOTE — Evaluation (Signed)
Physical Therapy Evaluation Patient Details Name: Stephanie Gross MRN: [id number] DOB: 1946-04-18 Today's Date: 12/09/2014   History of Present Illness  Adm due to MRI one as OP 01/2015 showed non hemorrhagic infarct left thalamus. right thalamus, left cerebellum and extensive white matter disease--likely chronic microvascular ischemia. Adm MRI repeated with acute infarct corpus collusum. PMHx- DM, HTN, smoker    Clinical Impression  Pt admitted with above diagnoses. She reports Lt sided facial numbness, tongue numbness and loss of taste (she is unsure if entire tongue or parts of her tongue), Lt arm and leg decr coordination. Her husband reports she has been drifting across center line when driving (she denies). Balance she scored 51/56 on Berg Balance Assessment and despite mild dyscoordination LLE she does not drift or lose her balance when walking. Her gait velocity is excellent. She does not need further PT interventions. Discussed stroke risk factors and asked her RN to obtain a Stroke booklet handout. Contacted OT for acute evaluation due to driving and ?visual deficits and they plan to see pt today.     Follow Up Recommendations No PT follow up    Equipment Recommendations  None recommended by PT    Recommendations for Other Services OT consult     Precautions / Restrictions Precautions Precautions: None      Mobility  Bed Mobility Overal bed mobility: Independent                Transfers Overall transfer level: Independent Equipment used: None                Ambulation/Gait Ambulation/Gait assistance: Independent Ambulation Distance (Feet): 200 Feet Assistive device: None Gait Pattern/deviations: WFL(Within Functional Limits) Gait velocity: excellent ability to vary speed Gait velocity interpretation: at or above normal speed for age/gender    Stairs            Wheelchair Mobility    Modified Rankin (Stroke Patients Only) Modified  Rankin (Stroke Patients Only) Pre-Morbid Rankin Score: No significant disability Modified Rankin: No significant disability     Balance                                 Standardized Balance Assessment Standardized Balance Assessment : Berg Balance Test Berg Balance Test Sit to Stand: Able to stand without using hands and stabilize independently Standing Unsupported: Able to stand safely 2 minutes Sitting with Back Unsupported but Feet Supported on Floor or Stool: Able to sit safely and securely 2 minutes Stand to Sit: Sits safely with minimal use of hands Transfers: Able to transfer safely, minor use of hands Standing Unsupported with Eyes Closed: Able to stand 10 seconds safely Standing Ubsupported with Feet Together: Able to place feet together independently but unable to hold for 30 seconds From Standing, Reach Forward with Outstretched Arm: Can reach confidently >25 cm (10") From Standing Position, Pick up Object from Floor: Able to pick up shoe safely and easily From Standing Position, Turn to Look Behind Over each Shoulder: Looks behind from both sides and weight shifts well Turn 360 Degrees: Able to turn 360 degrees safely in 4 seconds or less Standing Unsupported, Alternately Place Feet on Step/Stool: Able to stand independently and complete 8 steps >20 seconds Standing Unsupported, One Foot in Front: Able to plae foot ahead of the other independently and hold 30 seconds Standing on One Leg: Able to lift leg independently and hold 5-10 seconds Total Score: 51  Pertinent Vitals/Pain See vitals flow sheet.  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)     Hand Dominance   Dominant Hand: Right    Extremity/Trunk Assessment   Upper Extremity Assessment: Overall WFL for tasks assessed (rapid touching fingers to thumb equal bil)           Lower Extremity Assessment: LLE deficits/detail      Cervical / Trunk Assessment: Normal  Communication   Communication: No difficulties  Cognition Arousal/Alertness: Awake/alert Behavior During Therapy: WFL for tasks assessed/performed Overall Cognitive Status: Within Functional Limits for tasks assessed                      General Comments General comments (skin integrity, edema, etc.): Pt denies headache at time of eval. Reports early in a.m. she has severe HA posterior head/neck and during this time she has decr balance/coordination. She reports it lasts 20-25 minutes, HA goes away and symptoms resolve.    Exercises        Assessment/Plan    PT Assessment Patent does not need any further PT services  PT Diagnosis Hemiplegia non-dominant side   PT Problem List    PT Treatment Interventions     PT Goals (Current goals can be found in the Care Plan section) Acute Rehab PT Goals Patient Stated Goal: find out what causes headaaches PT Goal Formulation: All assessment and education complete, DC therapy    Frequency     Barriers to discharge        Co-evaluation               End of Session Equipment Utilized During Treatment: Gait belt Activity Tolerance: Patient tolerated treatment well Patient left: in bed;with call bell/phone within reach;with family/visitor present Nurse Communication: Mobility status (no further PT needed)         Time: [id number] PT Time Calculation (min) (ACUTE ONLY): 38 min   Charges:   PT Evaluation $Initial PT Evaluation Tier I: 1 Procedure PT Treatments $Gait Training: 23-37 mins   PT G Codes:         Stephanie Gross 12/20/14, 3:53 PM Pager [phone]

## 2014-12-09 NOTE — Evaluation (Addendum)
Occupational Therapy Evaluation Patient Details Name: Stephanie Gross MRN: [id number] DOB: 1945-05-10 Today's Date: 12/09/2014    History of Present Illness Admitted due to MRI one as OP 01/2015 showed non hemorrhagic infarct left thalamus. right thalamus, left cerebellum and extensive white matter disease--likely chronic microvascular ischemia. MRI repeated with acute infarct corpus collusum. PMHx- DM, HTN, smoker   Clinical Impression   Pt admitted with above. Education provided in session. Recommended pt not drive until she gets full visual field assessment completed and discussed safety concerns. Pt seemed to get frustrated in session and not very accepting of some information OT provided. Pt admitted to drifting left when driving. OT signing off. Pt planning to possibly d/c today and wrote down name of vision test for pt.    Follow Up Recommendations  Full visual field assessment (Humphrey 120.3); Depending on results may benefit from Outpatient OT, however pt not agreeable to Outpatient OT.   Equipment Recommendations       Recommendations for Other Services       Precautions / Restrictions Precautions Precautions: None      Mobility Bed Mobility      General bed mobility comments: not assessed  Transfers Overall transfer level: Independent Equipment used: None                  Balance  Pt able to pick something off floor. No balance deficits noted in session.                                   ADL Overall ADL's : Independent                                             Vision Pt wears glasses all the time; reports no change in vision from baseline. Vision Assessment?: Yes Tracking/Visual Pursuits:  (decreased smoothness) Visual Fields:  (inconsistent in left superior quadrant)   Perception     Praxis      Pertinent Vitals/Pain Pain Assessment: No/denies pain     Hand Dominance Right   Extremity/Trunk  Assessment Upper Extremity Assessment Upper Extremity Assessment: Overall WFL for tasks assessed   Lower Extremity Assessment Lower Extremity Assessment: Defer to PT evaluation LLE Coordination: decreased gross motor (decr RAM and heel to shin)   Cervical / Trunk Assessment Cervical / Trunk Assessment: Normal   Communication Communication Communication: No difficulties   Cognition Arousal/Alertness: Awake/alert Behavior During Therapy: WFL for tasks assessed/performed Overall Cognitive Status: Unsure of baseline-(decreased safety awareness)                     General Comments       Exercises       Shoulder Instructions      Home Living Family/patient expects to be discharged to:: Private residence Living Arrangements: Spouse/significant other Available Help at Discharge: Family;Available PRN/intermittently               Bathroom Shower/Tub: Tub/shower unit   [employer]:  (have both)     Home Equipment: Cane - single point;Walker - 2 wheels;Shower seat   Additional Comments: DME for husband      Prior Functioning/Environment Level of Independence: Independent        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; 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

## 2014-12-09 NOTE — Progress Notes (Signed)
  Echocardiogram 2D Echocardiogram has been performed.  Stephanie Gross 12/09/2014, 1:38 PM

## 2014-12-09 NOTE — H&P (Signed)
Internal Medicine Attending Admission Note Date: 12/09/2014  Patient name: Stephanie Gross Medical record number: [id number] Date of birth: 09-Apr-1946 Age: 69 y.o. Gender: female  I saw and evaluated the patient. I reviewed the resident's note and I agree with the resident's findings and plan as documented in the resident's note.  Chief Complaint(s): Morning dizziness and abnormal gait 1 month ago, left facial numbness and decreased taste on the left side of the tongue.  History - key components related to admission:  Stephanie Gross is a 69 year old woman with a history of coronary artery disease, diabetes, hypertension, and previous CVA who presented to the clinic in early July complaining of morning dizziness and a change in her gait. She deferred admission at that time and an outpatient MRI was ordered. 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 She was contacted when this result occurred but was unable to be admitted for further evaluation because she had a care for her husband. She has subsequently found a solution to this problem and is admitted to complete her stroke evaluation. Her only complaints are morning dizziness that resolve after about a half an hour. She also notes some neck pain which is her primary complaint. Finally, she states she has had left-sided facial numbness and left tongue lack of taste. She specifically denies falls, trauma, hypoglycemic episodes, or hypotension. There has been no other areas of numbness other than her left face and she denies weakness throughout. She is without other complaints at this time.  Physical Exam - key components related to admission:  Filed Vitals:   12/09/14 0200 12/09/14 0400 12/09/14 0602 12/09/14 0849  BP: 134/63 131/59 162/68 144/62  Pulse: 65 67 67 68  Temp: 98.5 F (36.9 C) 98.5  F (36.9 C) 98.6 F (37 C)   TempSrc: Oral Oral Oral Oral  Resp: 15 16  20   SpO2: 97% 97% 100% 100%   Gen.: Well-developed, well-nourished, woman sitting comfortably in bed eating breakfast in no acute distress. Neuro: Left facial numbness in V1, V2, and V3. Otherwise cranial nerves, strength, and sensation are grossly intact.   Lab results:  Basic Metabolic Panel:  Recent Labs  12/09/14 0603  NA 141  K 4.0  CL 111  CO2 24  GLUCOSE 87  BUN 24*  CREATININE 1.73*  CALCIUM 9.2   CBC:  Recent Labs  12/08/14 1853  WBC 7.3  HGB 10.4*  HCT 32.5*  MCV 91.3  PLT 240   CBG:  Recent Labs  12/08/14 2143 12/09/14 0637 12/09/14 1130  GLUCAP 208* 89 121*   Hemoglobin A1C:  Recent Labs  12/08/14 1853  HGBA1C 7.0*   Fasting Lipid Panel:  Recent Labs  12/09/14 0612  CHOL 190  HDL 40*  LDLCALC 123*  TRIG 134  CHOLHDL 4.8   Imaging results:  Mr Angiogram Neck Wo Contrast  12/08/2014   CLINICAL DATA:  Acute stroke in the left splenium of corpus callosum on MRI yesterday. Facial numbness and dizziness.  EXAM: MRA NECK WITHOUT CONTRAST  MRA HEAD WITHOUT CONTRAST  TECHNIQUE: Angiographic images of the neck were obtained using MRA technique without intravenous contast. Angiographic images of the Circle of Willis were obtained using MRA technique without intravenous contrast.  COMPARISON:  Head MRA 02/07/2012  FINDINGS: MRA NECK FINDINGS  Common carotid and cervical internal carotid arteries are  patent without evidence of significant stenosis. Vertebral arteries are patent with the left being mildly dominant and with antegrade flow bilaterally. No vertebral artery stenosis is seen.  MRA HEAD FINDINGS  The visualized distal vertebral arteries are patent with the left being mildly dominant. AICA and SCA origins are patent bilaterally. Basilar artery is patent without stenosis. Posterior communicating arteries are not clearly identified. PCAs are patent with minimal branch vessel  irregularity but no proximal stenosis.  Internal carotid arteries are patent from skullbase to carotid termini. There is atherosclerotic irregularity of both cavernous carotids with mild stenosis bilaterally, not significantly changed. 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  MCAs and ACAs are patent without proximal stenosis. Mild bilateral ACA and right MCA branch vessel irregularity and moderate irregularity and narrowing of multiple left MCA branch vessels do not appear significantly changed.  IMPRESSION: 1. No cervical carotid or vertebral artery stenosis. 2. No major intracranial arterial occlusion. 3. Mild stenosis of the cavernous carotid arteries, unchanged. 4. Unchanged, 3 mm left cavernous carotid aneurysm.   Electronically Signed   By: Logan Bores   On: 12/08/2014 22:17   Mr Brain Wo Contrast  12/07/2014   CLINICAL DATA:  69 year old female with 4 months of left facial numbness and dizziness in the mornings. Initial encounter.  The patient declined IV contrast.  EXAM: MRI HEAD WITHOUT CONTRAST  TECHNIQUE: Multiplanar, multiecho pulse sequences of the brain and surrounding structures were obtained without intravenous contrast.  COMPARISON:  Brain MRI and intracranial MRA 02/07/2012, and earlier.  FINDINGS: Abnormal Patchy and confluent cerebral white matter T2 and FLAIR signal has progressed since 2013, with evidence of mildly progressed chronic infarcts affecting the bodyof the corpus callosum.  Furthermore, there is a T2 hyperintense lesion of the left splenium of the corpus callosum with restricted diffusion (series 4, image 25 and series 5, image 11). Associated T2 and FLAIR hyperintensity without mass effect or hemorrhage.  No other restricted diffusion identified. Major intracranial vascular flow voids are stable. T2 heterogeneity in the deep gray matter nuclei also has progressed and suggests chronic lacunar infarcts in the bilateral thalami. Similar mild increased in patchy T2  hyperintensity in the pons and evidence of a right pontine chronic lacune.  The cerebellum remains relatively spared. No cortical encephalomalacia or chronic cerebral blood products are identified.  No midline shift, mass effect, evidence of mass lesion, ventriculomegaly, extra-axial collection or acute intracranial hemorrhage. Cervicomedullary junction and pituitary are within normal limits. Negative visualized cervical spine.  Mild mastoid effusions are new. Negative visualized nasopharynx. Stable mild paranasal sinus mucosal thickening. Bone marrow signal is within normal limits. Orbit and scalp soft tissues appear stable and normal.  IMPRESSION: 1. Signal changes in the left splenium of the corpus callosum most compatible with an acute to subacute lacunar infarct in this patient with chronically advanced and progressive small vessel ischemia elsewhere. Lacunar infarcts of the body of the corpus callosum have also progressed since 2013. 2. No associated hemorrhage or mass effect. These results will be called to the ordering clinician or representative by the Radiologist Assistant, and communication documented in the PACS or zVision Dashboard.   Electronically Signed   By: Genevie Ann M.D.   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 Facial numbness and dizziness.  EXAM: MRA NECK WITHOUT CONTRAST  MRA HEAD WITHOUT CONTRAST  TECHNIQUE: Angiographic images  of the neck were obtained using MRA technique without intravenous contast. Angiographic images of the Circle of Willis were obtained using MRA technique without intravenous contrast.  COMPARISON:  Head MRA 02/07/2012  FINDINGS: MRA NECK FINDINGS  Common carotid and cervical internal carotid arteries are patent without evidence of significant stenosis. Vertebral arteries are patent with the left being mildly dominant and with antegrade flow bilaterally. No  vertebral artery stenosis is seen.  MRA HEAD FINDINGS  The visualized distal vertebral arteries are patent with the left being mildly dominant. AICA and SCA origins are patent bilaterally. Basilar artery is patent without stenosis. Posterior communicating arteries are not clearly identified. PCAs are patent with minimal branch vessel irregularity but no proximal stenosis.  Internal carotid arteries are patent from skullbase to carotid termini. There is atherosclerotic irregularity of both cavernous carotids with mild stenosis bilaterally, not significantly changed. 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  MCAs and ACAs are patent without proximal stenosis. Mild bilateral ACA and right MCA branch vessel irregularity and moderate irregularity and narrowing of multiple left MCA branch vessels do not appear significantly changed.  IMPRESSION: 1. No cervical carotid or vertebral artery stenosis. 2. No major intracranial arterial occlusion. 3. Mild stenosis of the cavernous carotid arteries, unchanged. 4. Unchanged, 3 mm left cavernous carotid aneurysm.   Electronically Signed   By: Logan Bores   On: 12/08/2014 22:17   Other results:  EKG: Normal sinus rhythm at 68 bpm, normal axis, normal intervals, no significant Q waves, no LVH by voltage, good R wave progression, nonspecific ST changes, unchanged from the previous ECG on 04/14/2014.  Assessment & Plan by Problem:  Ms. Mehrtens is a 69 year old woman with a history of coronary artery disease, diabetes, hypertension, tobacco abuse, and previous CVA who presented just over one month ago with dizziness and a change in her gait. 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 Neurology has seen the patient and thinks that the lesion in the right pons may have been the symptomatic stroke that brought her to the clinic 1 month ago.  This then suggests she has had a more acute infarct. She continues to take her aspirin and Plavix so is maximized on antiplatelet therapy. She does have modifiable risk factors including diabetes, which is well controlled given her hemoglobin A1c of 7, hyperlipidemia with an LDL of 123, and tobacco abuse. In addition to completing the stroke evaluation it'll be important to modify these risk factors.  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 She will be started on rosuvastatin for her hyperlipidemia. Her diabetic regimen will remain unchanged. We will obtain an echocardiogram and Doppler examination of her carotids today. If this cannot be done as an inpatient today it will be arranged to be done as an outpatient given the subacute to chronic nature of her symptoms. We are also awaiting physical therapy and occupational therapy assessments. In the meantime, she remains on telemetry to look for evidence of atrial fibrillation.  2) Disposition: She will be discharged home today after physical therapy, occupational therapy, carotid Doppler, and echocardiogram evaluations. If the latter two cannot be arranged over the weekend she will be discharged home after her physical therapy and occupational therapy evaluations and the carotid Doppler and echocardiogram will be obtained  as an outpatient. Follow-up will be in the [facility].

## 2014-12-10 ENCOUNTER — Encounter (HOSPITAL_COMMUNITY): Payer: Self-pay | Admitting: *Deleted

## 2014-12-10 LAB — GLUCOSE, CAPILLARY: GLUCOSE-CAPILLARY: 71 mg/dL (ref 65–99)

## 2014-12-10 NOTE — Progress Notes (Signed)
Subjective: Stephanie Gross.  She was told she could be discharged last night after PT and OT evaluation, neuro had recommended checking a sed rate and CRP which was ordered for this morning. I discussed these test with the patient this morning but per nursing she has decided to leave this morning before the labs were obtained. Objective: Vital signs in last 24 hours: Filed Vitals:   12/10/14 0147 12/10/14 0600 12/10/14 0711 12/10/14 0956  BP: 153/60  179/65 176/69  Pulse: 64  55 57  Temp: 98.3 F (36.8 C)  98 F (36.7 C) 98.1 F (36.7 C)  TempSrc: Oral  Oral Oral  Resp: 16  18 20   Height:  5\' 5"  (1.651 m)    Weight:  143 lb (64.864 kg)    SpO2: 98%  99% 100%   Weight change:   Intake/Output Summary (Last 24 hours) at 12/10/14 1226 Last data filed at 12/10/14 0956  Gross per 24 hour  Intake    240 ml  Output      0 ml  Net    240 ml   General: resting in bed HEENT: PERRL, EOMI, no scleral icterus Cardiac: RRR, no rubs, murmurs or gallops Pulm: clear to auscultation bilaterally Abd: soft, nontender, nondistended, BS present Ext: warm and well perfused, no pedal edema Neuro: alert and oriented X3, decreased sensation of V1-3 on the left, otherwise CN exam unremarkable, gross strength intact bilaterally in all 4 extremities  Lab Results: CBC Latest Ref Rng 12/08/2014 11/01/2014 11/07/2013  WBC 4.0 - 10.5 K/uL 7.3 6.7 8.1  Hemoglobin 12.0 - 15.0 g/dL 10.4(L) 11.7(L) 10.2(L)  Hematocrit 36.0 - 46.0 % 32.5(L) 35.6(L) 33.5(L)  Platelets 150 - 400 K/uL 240 232 244    BMP Latest Ref Rng 12/09/2014 11/07/2013 10/22/2012  Glucose 65 - 99 mg/dL 87 93 139(H)  BUN 6 - 20 mg/dL 24(H) 16 28(H)  Creatinine 0.44 - 1.00 mg/dL 1.73(H) 1.00 1.17(H)  Sodium 135 - 145 mmol/L 141 144 141  Potassium 3.5 - 5.1 mmol/L 4.0 4.0 4.6  Chloride 101 - 111 mmol/L 111 108 109  CO2 22 - 32 mmol/L 24 24 24   Calcium 8.9 - 10.3 mg/dL 9.2 10.2 10.5    Lipid Panel     Component Value Date/Time   CHOL 190  12/09/2014 0612   TRIG 134 12/09/2014 0612   HDL 40* 12/09/2014 0612   CHOLHDL 4.8 12/09/2014 0612   VLDL 27 12/09/2014 0612   LDLCALC 123* 12/09/2014 0612   LDLDIRECT 111* 09/15/2008 2047   A1C 7.0  Micro Results: No results found for this or any previous visit (from the past 240 hour(s)). 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 Facial numbness and dizziness.  EXAM: MRA NECK WITHOUT CONTRAST  MRA HEAD WITHOUT CONTRAST  TECHNIQUE: Angiographic images of the neck were obtained using MRA technique without intravenous contast. Angiographic images of the Circle of Willis were obtained using MRA technique without intravenous contrast.  COMPARISON:  Head MRA 02/07/2012  FINDINGS: MRA NECK FINDINGS  Common carotid and cervical internal carotid arteries are patent without evidence of significant stenosis. Vertebral arteries are patent with the left being mildly dominant and with antegrade flow bilaterally. No vertebral artery stenosis is seen.  MRA HEAD FINDINGS  The visualized distal vertebral arteries are patent with the left being mildly dominant. AICA and SCA origins are patent bilaterally. Basilar artery is patent without  stenosis. Posterior communicating arteries are not clearly identified. PCAs are patent with minimal branch vessel irregularity but no proximal stenosis.  Internal carotid arteries are patent from skullbase to carotid termini. There is atherosclerotic irregularity of both cavernous carotids with mild stenosis bilaterally, not significantly changed. 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  MCAs and ACAs are patent without proximal stenosis. Mild bilateral ACA and right MCA branch vessel irregularity and moderate irregularity and narrowing of multiple left MCA branch vessels do not appear significantly changed.  IMPRESSION: 1. No cervical carotid or vertebral artery  stenosis. 2. No major intracranial arterial occlusion. 3. Mild stenosis of the cavernous carotid arteries, unchanged. 4. Unchanged, 3 mm left cavernous carotid aneurysm.   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 Facial numbness and dizziness.  EXAM: MRA NECK WITHOUT CONTRAST  MRA HEAD WITHOUT CONTRAST  TECHNIQUE: Angiographic images of the neck were obtained using MRA technique without intravenous contast. Angiographic images of the Circle of Willis were obtained using MRA technique without intravenous contrast.  COMPARISON:  Head MRA 02/07/2012  FINDINGS: MRA NECK FINDINGS  Common carotid and cervical internal carotid arteries are patent without evidence of significant stenosis. Vertebral arteries are patent with the left being mildly dominant and with antegrade flow bilaterally. No vertebral artery stenosis is seen.  MRA HEAD FINDINGS  The visualized distal vertebral arteries are patent with the left being mildly dominant. AICA and SCA origins are patent bilaterally. Basilar artery is patent without stenosis. Posterior communicating arteries are not clearly identified. PCAs are patent with minimal branch vessel irregularity but no proximal stenosis.  Internal carotid arteries are patent from skullbase to carotid termini. There is atherosclerotic irregularity of both cavernous carotids with mild stenosis bilaterally, not significantly changed. 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  MCAs and ACAs are patent without proximal stenosis. Mild bilateral ACA and right MCA branch vessel irregularity and moderate irregularity and narrowing of multiple left MCA branch vessels do not appear significantly changed.  IMPRESSION: 1. No cervical carotid or vertebral artery stenosis. 2. No major intracranial arterial occlusion. 3. Mild stenosis of the cavernous  carotid arteries, unchanged. 4. Unchanged, 3 mm left cavernous carotid aneurysm.   Electronically Signed   By: Logan Bores   On: 12/08/2014 22:17   Medications: I have reviewed the patient's current medications. Scheduled Meds: . aspirin EC  81 mg Oral Daily  . carvedilol  25 mg Oral BID WC  . chlorthalidone  25 mg Oral Daily  . clopidogrel  75 mg Oral Daily  . enoxaparin (LOVENOX) injection  40 mg Subcutaneous Q24H  . insulin aspart  0-5 Units Subcutaneous QHS  . insulin aspart  0-9 Units Subcutaneous TID WC  . insulin detemir  25 Units Subcutaneous QHS  . isosorbide mononitrate  60 mg Oral Daily  . lisinopril  40 mg Oral Daily  . pantoprazole  40 mg Oral Daily  . rosuvastatin  20 mg Oral q1800   Continuous Infusions:  PRN Meds:. Assessment/Plan: Active Problems:   Acute CVA (cerebrovascular accident)   Tobacco abuse   Hyperlipidemia   Type 2 diabetes mellitus with other circulatory complications MRI finds of acute to subacute lacunar infarct:  - A1c at goal - LDL above goal started crestor 20 mg- Pt said she was able to  tolerate it. - PT evaluated with no further treatment recommended. - OT evaluated, noted visual field concerns and driving concerns recommended visual field assessment as outpatient before resumption of driving, I have discussed this with her that I do not recommend she drive for at least 2 months and only after repeat evaluation by neurology. -Continue aspirin and plavix 75 -BP above goal, will need further work to get to goal in clinic  Elevated Serum Creatine -SCr elevated to 1.7 on admission, this may be progression of CKD or mild AKI. I had ordered a repeat BMP for this morning but she did not obtain it.  We can obtain a repeat BMP in clinic early next week to follow this up.  HTN: BP above goal this AM, will need ongoing work on this in the outpatient clinic  Continue On home meds of lisinopril 40, chlorthalidone 25, and coreg 25, and imdur 60  T2DM:  CBGs well controlled inpatient -On Levemir 40 units at home, decreased to 25 units on admission -SSI -check A1C- came back at 7  #FEN:  -Diet: carb modified  #DVT prophylaxis: Lovemox 40 mg  #CODE STATUS: full code   Dispo: Discharged home, will arrange follow up with [facility] and neurology.  The patient does have a current PCP Loleta Chance, MD) and does need an [facility] hospital follow-up appointment after discharge.  The patient does not have transportation limitations that hinder transportation to clinic appointments.  .Services Needed at time of discharge: Y = Yes, Blank = No PT:   OT:   RN:   Equipment:   Other:     LOS: 2 days   Lucious Groves, DO 12/10/2014, 12:26 PM

## 2014-12-10 NOTE — Progress Notes (Signed)
Pt for discharge home today. telemetry dcd with dressing. Discharge instructions and and handouts given. Advised that prescriptions called in to [facility]. Husband at bedside to assist with discharge. Staff brought patient to lobby via wheelchair at 1110. Transported to home by family member. Consult to [facility] care management ordered.

## 2014-12-10 NOTE — Progress Notes (Signed)
Patient refused to wait until lab draws were completed. Stated that she was not willing to wait past 1100 despite encouragement to stay.

## 2014-12-11 ENCOUNTER — Telehealth: Payer: Self-pay | Admitting: Internal Medicine

## 2014-12-11 DIAGNOSIS — I639 Cerebral infarction, unspecified: Secondary | ICD-10-CM

## 2014-12-11 DIAGNOSIS — I2510 Atherosclerotic heart disease of native coronary artery without angina pectoris: Secondary | ICD-10-CM

## 2014-12-11 DIAGNOSIS — I10 Essential (primary) hypertension: Secondary | ICD-10-CM

## 2014-12-11 DIAGNOSIS — Z955 Presence of coronary angioplasty implant and graft: Secondary | ICD-10-CM

## 2014-12-11 MED ORDER — CHLORTHALIDONE 25 MG PO TABS: 25.0000 mg | ORAL_TABLET | Freq: Every day | ORAL | Status: DC

## 2014-12-11 MED ORDER — ISOSORBIDE MONONITRATE ER 60 MG PO TB24: 60.0000 mg | ORAL_TABLET | Freq: Every day | ORAL | Status: DC

## 2014-12-11 MED ORDER — ATORVASTATIN CALCIUM 40 MG PO TABS: 40.0000 mg | ORAL_TABLET | Freq: Every day | ORAL | Status: DC

## 2014-12-11 MED ORDER — CLOPIDOGREL BISULFATE 75 MG PO TABS: 75.0000 mg | ORAL_TABLET | Freq: Every day | ORAL | Status: DC

## 2014-12-11 MED ORDER — CARVEDILOL 25 MG PO TABS: 25.0000 mg | ORAL_TABLET | Freq: Two times a day (BID) | ORAL | Status: DC

## 2014-12-11 MED ORDER — LISINOPRIL 40 MG PO TABS: 40.0000 mg | ORAL_TABLET | Freq: Every day | ORAL | Status: DC

## 2014-12-11 NOTE — Telephone Encounter (Signed)
Pt call requesting the nurse to call back regarding about medicine that she can not afford to paid. Please call patient back.

## 2014-12-11 NOTE — Addendum Note (Signed)
Addended by: Forde Dandy on: 12/11/2014 05:53 PM   Modules accepted: Orders, Medications

## 2014-12-11 NOTE — Telephone Encounter (Addendum)
Collaborated with PCP on post-transition follow up. Patient has a history of non-compliance due to patient-reported medication cost. We have supplied her with insulin samples in the past and working with PCP to develop a process for long-term medication access.  Utilizing [facility]) mail order pharmacy service, patient is able to obtain non-insulin prescriptions for $0 copay per 90 day supply. Patient verbalized approval of transferring pharmacies. Rx's called in to this pharmacy per PCP approval to verify $0 copay and arrange patient enrollment.  Also contacted her previous pharmacy [facility]) to deactivate Rx's on file.  Other findings:  Insulins (detemir and aspart) approx. $150 per month. Patient reports she can afford around $50 per month so may consider switching to OTC insulin regimen.  Recommend close monitoring of renal function (chlorthalidone and lisinopril).  Patient reports not regularly taking lansoprazole so can d/c for now and manage patient with OTC H2RA such as ranitidine or famotidine as well as dietary modifications.  Willing to continue providing ongoing assistance with patient's medications in the future. Please let me know how I can help.  Patient advised to contact me if further medication-related concerns arise.

## 2014-12-11 NOTE — Telephone Encounter (Signed)
Talked with pt - unable to afford Crestor med $87.00 for 30 tals at [facility]. Has [employer] care. Talked with Dr Maudie Mercury about Rx - pt aware Dr Maudie Mercury will call her today. Hilda Blades Danikah Budzik RN 12/11/14 10:30AM

## 2014-12-11 NOTE — Discharge Summary (Signed)
Name: Stephanie Gross MRN: [id number] DOB: 12/13/1945 69 y.o. PCP: Loleta Chance, MD  Date of Admission: 12/08/2014  4:55 PM Date of Discharge: 12/10/2014 Attending Physician: Dr Oval Linsey  Discharge Diagnosis: 1. Stroke workup Active Problems:   Acute CVA (cerebrovascular accident)   Tobacco abuse   Hyperlipidemia   Type 2 diabetes mellitus with other circulatory complications  Discharge Medications:   Medication List    TAKE these medications        AGAMATRIX PRESTO PRO METER Devi  1 Device by Does not apply route 4 (four) times daily.     aspirin EC 81 MG tablet  Take 81 mg by mouth daily.     carvedilol 25 MG tablet  Commonly known as:  COREG  TAKE ONE TABLET BY MOUTH TWICE DAILY WITH MEALS     chlorthalidone 25 MG tablet  Commonly known as:  HYGROTON  TAKE ONE TABLET BY MOUTH ONCE DAILY     clopidogrel 75 MG tablet  Commonly known as:  PLAVIX  Take 1 tablet (75 mg total) by mouth daily.     glucose blood test strip  Commonly known as:  AGAMATRIX PRESTO TEST  Use as instructed     glucose blood test strip  Commonly known as:  ONE TOUCH ULTRA TEST  Check blood sugar before and after meals and bedtime 250.00 insulin requiring     insulin aspart 100 UNIT/ML FlexPen  Commonly known as:  NOVOLOG FLEXPEN  Inject 1U per carbohydrate and 1 units per $RemoveB'40mg'ukVvWBQv$ /dL increment blood sugar over 140, 60-99 inject 2 units, 100-140 inject 3 units, 141-180 inject 4 units, 181-220 inject 5 units, 221-260 inject 6 units, 261-300 inject 7 units, 301-340 inject 8 units     insulin detemir 100 UNIT/ML injection  Commonly known as:  LEVEMIR  Inject 0.4 mLs (40 Units total) into the skin at bedtime.     isosorbide mononitrate 60 MG 24 hr tablet  Commonly known as:  IMDUR  Take 1 tablet (60 mg total) by mouth daily.     lansoprazole 30 MG capsule  Commonly known as:  PREVACID  Take 1 capsule (30 mg total) by mouth daily at 12 noon.     lisinopril 40 MG tablet  Commonly  known as:  PRINIVIL,ZESTRIL  Take 1 tablet (40 mg total) by mouth 2 (two) times daily.     rosuvastatin 20 MG tablet  Commonly known as:  CRESTOR  Take 1 tablet (20 mg total) by mouth daily at 6 PM.        Disposition and follow-up:   [name] was discharged from [facility] in Good condition.  At the hospital follow up visit please address:  1.  Visual field: OT noted visual field deficits, and patient's husband noted that she was found driving off lanes several times, but patient denied it. Please do  Full exam. We have told her not to drive for 2 month, at least until being cleared by Neurology  Stroke workup: full stroke workup has been done and has found no new changes except for the MRI findings of lacunar infarct for which we had requested admission.  HTN: Her blood pressure continued to be elevated- may need to adjust her dosing of chlorthalidone, coreg, lisinopril, and Imdur. Her imdur has been cut in half to 60 mg at discharge due to complaints about dizziness.  ESR, CRP: Neurology recommended checking ESR and CRP.  2.  Labs / imaging needed at time of  follow-up:  ESR, CRP  3.  Pending labs/ test needing follow-up:   Follow-up Appointments:     Follow-up Information    Follow up with Loleta Chance, MD. Schedule an appointment as soon as possible for a visit in 1 week.   Specialty:  Internal Medicine   Why:  the office should call in the next few days with an appointment   Contact information:   [place] [address] [phone]       Follow up with [facility]. Schedule an appointment as soon as possible for a visit in 2 months.   Why:  for stroke follow up   Contact information:   [address] [place] [address] [phone]      Discharge Instructions: Discharge Instructions    Call MD for:  difficulty breathing, headache or visual disturbances    Complete by:   As directed      Call MD for:  extreme fatigue    Complete by:  As directed      Call MD for:  hives    Complete by:  As directed      Call MD for:  persistant dizziness or light-headedness    Complete by:  As directed      Call MD for:  persistant nausea and vomiting    Complete by:  As directed      Call MD for:  redness, tenderness, or signs of infection (pain, swelling, redness, odor or green/yellow discharge around incision site)    Complete by:  As directed      Call MD for:  severe uncontrolled pain    Complete by:  As directed      Call MD for:  temperature >100.4    Complete by:  As directed      Diet - low sodium heart healthy    Complete by:  As directed      Diet - low sodium heart healthy    Complete by:  As directed      Discharge instructions    Complete by:  As directed   Please take 60 mg of IMDUR instead of original of 120 mg. The clinic will call you to make the follow up appointment in the next 3 days- please return to the clinic in approximately 1 week for further evaluation.     Increase activity slowly    Complete by:  As directed            Consultations: Treatment Team:  [facility] Stroke, MD  Procedures Performed:  Mr Angiogram Neck Wo Contrast  12/08/2014   CLINICAL DATA:  Acute stroke in the left splenium of corpus callosum on MRI yesterday. Facial numbness and dizziness.  EXAM: MRA NECK WITHOUT CONTRAST  MRA HEAD WITHOUT CONTRAST  TECHNIQUE: Angiographic images of the neck were obtained using MRA technique without intravenous contast. Angiographic images of the Circle of Willis were obtained using MRA technique without intravenous contrast.  COMPARISON:  Head MRA 02/07/2012  FINDINGS: MRA NECK FINDINGS  Common carotid and cervical internal carotid arteries are patent without evidence of significant stenosis. Vertebral arteries are patent with the left being mildly dominant and with antegrade flow bilaterally. No vertebral artery stenosis is seen.  MRA HEAD  FINDINGS  The visualized distal vertebral arteries are patent with the left being mildly dominant. AICA and SCA origins are patent bilaterally. Basilar artery is patent without stenosis. Posterior communicating arteries are not clearly identified. PCAs are  patent with minimal branch vessel irregularity but no proximal stenosis.  Internal carotid arteries are patent from skullbase to carotid termini. There is atherosclerotic irregularity of both cavernous carotids with mild stenosis bilaterally, not significantly changed. 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  MCAs and ACAs are patent without proximal stenosis. Mild bilateral ACA and right MCA branch vessel irregularity and moderate irregularity and narrowing of multiple left MCA branch vessels do not appear significantly changed.  IMPRESSION: 1. No cervical carotid or vertebral artery stenosis. 2. No major intracranial arterial occlusion. 3. Mild stenosis of the cavernous carotid arteries, unchanged. 4. Unchanged, 3 mm left cavernous carotid aneurysm.   Electronically Signed   By: Logan Bores   On: 12/08/2014 22:17   Mr Brain Wo Contrast  12/07/2014   CLINICAL DATA:  69 year old female with 4 months of left facial numbness and dizziness in the mornings. Initial encounter.  The patient declined IV contrast.  EXAM: MRI HEAD WITHOUT CONTRAST  TECHNIQUE: Multiplanar, multiecho pulse sequences of the brain and surrounding structures were obtained without intravenous contrast.  COMPARISON:  Brain MRI and intracranial MRA 02/07/2012, and earlier.  FINDINGS: Abnormal Patchy and confluent cerebral white matter T2 and FLAIR signal has progressed since 2013, with evidence of mildly progressed chronic infarcts affecting the bodyof the corpus callosum.  Furthermore, there is a T2 hyperintense lesion of the left splenium of the corpus callosum with restricted diffusion (series 4, image 25 and series 5, image 11). Associated T2 and FLAIR hyperintensity without  mass effect or hemorrhage.  No other restricted diffusion identified. Major intracranial vascular flow voids are stable. T2 heterogeneity in the deep gray matter nuclei also has progressed and suggests chronic lacunar infarcts in the bilateral thalami. Similar mild increased in patchy T2 hyperintensity in the pons and evidence of a right pontine chronic lacune.  The cerebellum remains relatively spared. No cortical encephalomalacia or chronic cerebral blood products are identified.  No midline shift, mass effect, evidence of mass lesion, ventriculomegaly, extra-axial collection or acute intracranial hemorrhage. Cervicomedullary junction and pituitary are within normal limits. Negative visualized cervical spine.  Mild mastoid effusions are new. Negative visualized nasopharynx. Stable mild paranasal sinus mucosal thickening. Bone marrow signal is within normal limits. Orbit and scalp soft tissues appear stable and normal.  IMPRESSION: 1. Signal changes in the left splenium of the corpus callosum most compatible with an acute to subacute lacunar infarct in this patient with chronically advanced and progressive small vessel ischemia elsewhere. Lacunar infarcts of the body of the corpus callosum have also progressed since 2013. 2. No associated hemorrhage or mass effect. These results will be called to the ordering clinician or representative by the Radiologist Assistant, and communication documented in the PACS or zVision Dashboard.   Electronically Signed   By: Genevie Ann M.D.   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 Facial numbness and dizziness.  EXAM: MRA NECK WITHOUT CONTRAST  MRA HEAD WITHOUT CONTRAST  TECHNIQUE: Angiographic images of the neck were obtained using MRA technique without intravenous contast. Angiographic images of the Circle of Willis were obtained using MRA technique without intravenous  contrast.  COMPARISON:  Head MRA 02/07/2012  FINDINGS: MRA NECK FINDINGS  Common carotid and cervical internal carotid arteries are patent without evidence of significant stenosis. Vertebral arteries are patent with the left being mildly dominant and with antegrade flow bilaterally.  No vertebral artery stenosis is seen.  MRA HEAD FINDINGS  The visualized distal vertebral arteries are patent with the left being mildly dominant. AICA and SCA origins are patent bilaterally. Basilar artery is patent without stenosis. Posterior communicating arteries are not clearly identified. PCAs are patent with minimal branch vessel irregularity but no proximal stenosis.  Internal carotid arteries are patent from skullbase to carotid termini. There is atherosclerotic irregularity of both cavernous carotids with mild stenosis bilaterally, not significantly changed. 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  MCAs and ACAs are patent without proximal stenosis. Mild bilateral ACA and right MCA branch vessel irregularity and moderate irregularity and narrowing of multiple left MCA branch vessels do not appear significantly changed.  IMPRESSION: 1. No cervical carotid or vertebral artery stenosis. 2. No major intracranial arterial occlusion. 3. Mild stenosis of the cavernous carotid arteries, unchanged. 4. Unchanged, 3 mm left cavernous carotid aneurysm.   Electronically Signed   By: Logan Bores   On: 12/08/2014 22:17    2D Echo:  See hospital course    Cardiac Cath:   Admission HPI:  [name] is a 69 yo woman with notable PMH of CVA (last one in 2013), CAD, HTN, DM, who was called to the hospital for direct admission after her MRI results from 4 weeks ago came back suggestive of new stroke. She had come in on 7/06 with symptoms suggestive of stroke That included morning dizziness, and change in her gait which her coworkers had noticed, and in the office she was found to have abnormal gait and orthostatics were  abnormal. She was recommended to be admitted at that time, but she did not want to and so an outpatient MRI of the brain was ordered. She followed up 1 week later then said that since she stopped taking Imdur and lisinopril, all her symptoms improved, so imdur 60 mg was started again. Yesterday, her MRI results came back and it showed acute to subacute lacunar infarct inc corpus callosum with progressive small vessel ischemia, so Dr Oris Drone called in the patient for evaluation. Her last TIA was in 2013 in which Acute punctate non hemorrhagic infarct was present within the left thalamus.  Since seen last month, she does not report any new symptoms. But she mentions that she has lost taste and her left face feels numb for the past 1 month. She reports a weight loss of about 30 pounds over the last few years. She reports morning headaches in the temporal region that has been ongoing for the past few months, and the headaches get better after she walks around for few hours. She also reports intermittent diarrhea for few months- cdiff negative and she says it happens after she takes one of her meds. She denies n/v/cp/sob/dizziness or other symptoms.   She is compliant with her aspirin and plavix. She continues to smoke 1/2 ppd for many years. She has taken crestor in the past but was stopped due to cost issues.   Hospital Course by problem list: Active Problems:   Acute CVA (cerebrovascular accident)   Tobacco abuse   Hyperlipidemia   Type 2 diabetes mellitus with other circulatory complications   1. MRI finds of acute to subacute lacunar infarct:  Since the MRI showed, acute to subacute infarct in the left corpus callosum, patient was called in to be admitted for full Neurological stroke evaluation. She was clearly outside the tPA window, but nevertheless, the workup was done. Full neurological exam revealed no  focal deficits except for loss of sensation on her left face (trigeminal V1,V2 and  v3) region, and loss of taste on the tongue which was new. In the clinic last month, she was found to have abnormal gait, however on our exam, patient had normal gait, normal motor strength, and normal sensation in UE and LE. Her cerebellar function was also intact. Neurology was consulted and was following [name]. They thought that the right pons acute infarct was in fact the symptomatic stroke that brought her in the clinic last month. She was already taking aspirin and plavix and so was already maximized on dual antiplatelet therapy and we attempted to modify her risk factors including counseling her on quitting smoking, obtaining a lipid panel which showed an LDL of 123 and so she was subsequently started on Crestor 20 mg daily, and also obtained A1C which was 7, and so we recommended continuing her current diabetes medicine regimen. While in the hospital, she had an MRA of the brain and neck done which showed No cervical carotid or vertebral artery stenosis, No major intracranial arterial occlusion, Mild stenosis of the cavernous carotid arteries, unchanged, and Unchanged, 3 mm left cavernous carotid aneurysm.So, she had no changes on the MRA since last month. She also had a 2D echo which revealed EF of 65-70% with no cardiac source of emboli found. Carotid dopplers done which showed 1-39% ICA stenosis with antegrade vertebral flow.    She was also evaluated by PT with no further recs, but OT had evaluated and noted visual field concerns and patient's husband had told them that she frequently crossed the marks when driving, and so they recommended full visual field assessment as outpatient before resumption of driving. We told her that patient should not drive for 2 months, and only after obtaining clearance from Neurology.   2. HTN: Her blood pressure continued to be elevated at around 170/70. She was continued on Coreg, lisinopril, and chlorthalidone, and her Imdur was cut in half to 60 mg due to the  concern regarding dizziness.  3. HLD: LDL was found to be above goal at 123, so she was started on Crestor 20 mg daily. She had previously been on crestor but she had discontinued it due to cost issues. She is not allergic to crestor and we observed no adverse reactions.  4. Diabetes: A1C 7.0 , goal <7, her home lantus dose of 40 units was decreased to 25 units in the hospital and was on SSI. CBGs remained between 70-200.  5. Morning dizziness: After extensive history taking, workup, and questioning by multiple providers, no cause could be found at this time. Her symptoms consisted more of occipital and temporal pain when she wakes up and goes away in few hours once she starts moving again. Not consistent with BPPV or other cardiac causes. Most likely due to polypharmacy and multiple antihypertensives which may need to be adjusted outpatient to see which one is causing her dizziness. Nevertheless, her imdur was cut in half as pt had mentioned last month that she felt better after holding some of her meds.       Discharge Vitals:   BP 176/69 mmHg  Pulse 57  Temp(Src) 98.1 F (36.7 C) (Oral)  Resp 20  Ht $R'5\' 5"'fV$  (1.651 m)  Wt 143 lb (64.864 kg)  BMI 23.80 kg/m2  SpO2 100%  Discharge Labs:  No results found for this or any previous visit (from the past 24 hour(s)).  Signed: Burgess Estelle, MD 12/11/2014,  4:15 PM    Services Ordered on Discharge:  Equipment Ordered on Discharge:

## 2014-12-14 ENCOUNTER — Encounter: Payer: Self-pay | Admitting: Internal Medicine

## 2014-12-14 ENCOUNTER — Other Ambulatory Visit: Payer: Self-pay

## 2014-12-14 ENCOUNTER — Ambulatory Visit (INDEPENDENT_AMBULATORY_CARE_PROVIDER_SITE_OTHER): Payer: Medicare Other | Admitting: Internal Medicine

## 2014-12-14 VITALS — BP 156/58 | HR 64 | Temp 98.1°F | Ht 65.0 in | Wt 134.9 lb

## 2014-12-14 DIAGNOSIS — Z79899 Other long term (current) drug therapy: Secondary | ICD-10-CM

## 2014-12-14 DIAGNOSIS — I639 Cerebral infarction, unspecified: Secondary | ICD-10-CM | POA: Diagnosis not present

## 2014-12-14 DIAGNOSIS — E119 Type 2 diabetes mellitus without complications: Secondary | ICD-10-CM

## 2014-12-14 DIAGNOSIS — Z794 Long term (current) use of insulin: Secondary | ICD-10-CM | POA: Diagnosis not present

## 2014-12-14 DIAGNOSIS — E785 Hyperlipidemia, unspecified: Secondary | ICD-10-CM

## 2014-12-14 DIAGNOSIS — R439 Unspecified disturbances of smell and taste: Secondary | ICD-10-CM

## 2014-12-14 DIAGNOSIS — F17211 Nicotine dependence, cigarettes, in remission: Secondary | ICD-10-CM

## 2014-12-14 DIAGNOSIS — F17200 Nicotine dependence, unspecified, uncomplicated: Secondary | ICD-10-CM

## 2014-12-14 DIAGNOSIS — N179 Acute kidney failure, unspecified: Secondary | ICD-10-CM

## 2014-12-14 DIAGNOSIS — R209 Unspecified disturbances of skin sensation: Secondary | ICD-10-CM

## 2014-12-14 DIAGNOSIS — I10 Essential (primary) hypertension: Secondary | ICD-10-CM

## 2014-12-14 DIAGNOSIS — I69398 Other sequelae of cerebral infarction: Secondary | ICD-10-CM

## 2014-12-14 DIAGNOSIS — H538 Other visual disturbances: Secondary | ICD-10-CM

## 2014-12-14 DIAGNOSIS — Z7982 Long term (current) use of aspirin: Secondary | ICD-10-CM

## 2014-12-14 DIAGNOSIS — E1110 Type 2 diabetes mellitus with ketoacidosis without coma: Secondary | ICD-10-CM

## 2014-12-14 MED ORDER — CHLORTHALIDONE 50 MG PO TABS: 50.0000 mg | ORAL_TABLET | Freq: Every day | ORAL | Status: DC

## 2014-12-14 NOTE — Patient Outreach (Signed)
[facility]) Care Management  12/14/2014  Stephanie Gross 04/07/1946 [id number]  EMMI STROKE PROGRAM RED ALERT 12/14/2014 Referral Date: 12/14/14 Questions/problems with meds? Yes  Scheduled a follow-up appointment? No  Filled new prescriptions? No    Outreach call to patient.  Patient's husband, Quitman Livings reached.  States patient is on MD appt at this time.   RN CM confirmed appt is with PCP which was scheduled as post hospital discharge follow-up appt.  RN CM discussed issues with medications so this may be addressed on this visit as needed.  Husband states patient is not able to afford Crestor - states cost was $86.00 on pick up at the pharmacy.  States insulin is also $100.00 and patient is also on other medications.  State another issue is that every time patient goes into the hospital they stop medications and start new ones even though patient may have just purchased the medication and this also creates a financial problem.   Insurance:  AARP/UHC/MCR  Medicaid:  None - Husband states he does not think that wife has ever applied for MCD.  Husband states he is on disability and SHIP program and household income is very small.  PCP: per Epic - Dr. Loleta Chance, [place].   Plan:  RN CM will follow-up with patient again this afternoon to continue EMMI Stroke call.  RN CM will send SW referral for Financial Assessment and possible Medicaid Application.  RN CM will send Pharmacy Referral for affordability issues.   Mariann Laster, RN, BSN, [facility], CCM  [employer] Management Coordinator [phone] Direct [phone] Cell [phone] Office [phone] Fax

## 2014-12-14 NOTE — Patient Instructions (Signed)
General Instructions: Thank you for coming in today  - I am increasing the dose of your Chlorthalidone today from 25 mg daily to 50 mg daily.  - Please continue to take your blood pressure at home and keep a log of it and bring it with you to your next visit - Your new mail order medications should arrive in the next couple of days. If they do not come or you have any issues, please call the clinic.  - Please call [facility] and schedule a follow up with them in 2 months. Their number is [phone]. If you have any problems, please let us know - I would like you to see an Opthalmologist to further evaluate your eyes. Please call [facility] and schedule an appointment with them. Their number is [phone].   - Good job quitting smoking, please let us know if you need any help - I would like to see you back in clinic in 2 weeks  Treatment Goals:  Goals (1 Years of Data) as of 12/14/14          As of Today 12/10/14 12/10/14 12/10/14 12/09/14     Blood Pressure   . Blood Pressure < 140/80  156/58 176/69 179/65 153/60 160/60     Result Component   . HEMOGLOBIN A1C < 7.0         . LDL CALC < 70      123      Progress Toward Treatment Goals:  Treatment Goal 08/30/2013  Hemoglobin A1C -  Blood pressure at goal  Stop smoking smoking the same amount    Self Care Goals & Plans:  Self Care Goal 11/08/2014  Manage my medications take my medicines as prescribed; bring my medications to every visit; refill my medications on time  Monitor my health bring my glucose meter and log to each visit; keep track of my blood pressure; keep track of my blood glucose; check my feet daily  Eat healthy foods eat baked foods instead of fried foods; eat more vegetables; eat foods that are low in salt; eat fruit for snacks and desserts; drink diet soda or water instead of juice or soda  Be physically active find an activity I enjoy  Stop smoking -  Meeting treatment goals -    Home  Blood Glucose Monitoring 11/08/2014  Check my blood sugar 3 times a day  When to check my blood sugar after breakfast; after lunch; after dinner     Care Management & Community Referrals:  Referral 11/08/2013  Referrals made for care management support -  Referrals made to community resources none

## 2014-12-14 NOTE — Assessment & Plan Note (Signed)
  Assessment: Progress toward smoking cessation:   quit Barriers to progress toward smoking cessation:   cravings, habit Comments: Patient reports that she has stopped smoking "cold [place]" since being admitted to the hospital last week. She says that she is struggling with cravings and eating more but wants to try and quit on her own. Has tried Chantix in the past and says it made her "crazy."   Plan: Instruction/counseling given:  I commended patient for quitting and reviewed strategies for preventing relapses. Educational resources provided:    Self management tools provided:    Medications to assist with smoking cessation:  None Patient agreed to the following self-care plans for smoking cessation:    Other plans:

## 2014-12-14 NOTE — Assessment & Plan Note (Addendum)
Lab Results  Component Value Date   HGBA1C 7.0* 12/08/2014   HGBA1C 7.1 09/14/2014   HGBA1C 7.0 04/14/2014     Assessment: Diabetes control:  well controlled Progress toward A1C goal:   at goal Comments: Patient compliant with her medications. On Levemir 40 units qhs.   Plan: Medications:  continue current medications Home glucose monitoring: Frequency:   Timing:   Instruction/counseling given: reminded to get eye exam, reminded to bring blood glucose meter & log to each visit and reminded to bring medications to each visit Educational resources provided:   Self management tools provided:   Other plans: Patient's diabetes is well controlled. Will continue to monitor. Per Dr. Julianne Rice note, Insulins (detemir and aspart) approx. $150 per month. Patient reports she can afford around $50 per month so may consider switching to OTC insulin regimen in the future if she is unable to afford the medications. May benefit from Metformin for cardioprotective benefits.   ADDENDUM: Spoke with Dr. Maudie Mercury and the patient has Levemir and Novolog right now but is getting it as samples and from her brother's insulin. She reports taking 40 units qhs as well as 1-5 units of Novolog with meals. As she is unable to afford the Levemir and her supply is reliant on other people, will switch to over the counter insulin at next visit, NPH 10 units bid (0.1 u/kg) and slowly titrate up with close follow up. She has an appointment scheduled for 9/1 and has insulin supplies to last her until then.  She has been on Metformin in the past with reported alternating diarrhea and constipation. She is agreeable to restarting Metformin. Will give Metformin XR 500 mg and see how she is able to tolerate it.

## 2014-12-14 NOTE — Assessment & Plan Note (Addendum)
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 Patient was already on ASA and Plavix prior to the CVA. Echo and Carotid dopplers done in the Hospital were unremarkable. EF 65-70% with no cardiac emboli and 1-39% ICA stenosis with antegrade vertebral flow. Evaluated by PT with no recommendations but OT noted some visual field deficits and patient's husband was concerned about her deviating to the left while walking and driving.   Today, her neurologic exam was only notable for decreased sensation on the left side of her face (trigmeninal V1, V2, V3), reported loss of sensation of taste, mild tongue deviation to the left. Noted some bilateral lateral visual field deficits today. Will refer to opthalmology, early glaucoma vs residual deficit from stroke although it does appear to be bilateral and less likely a sequale from the stroke. Denies any headaches today.  Patient is currently taking Lipitor 40 mg daily, ASA and Plavix, lisinopril 40 mg daily. Will continue with those medications. Has not made a follow up appointment with Neurology yet, will give her their number and have her call to set up an appointment for 2 month follow up. Some concern for Giant Cell Arteritis in the hospital. Denies any headaches today. Will check ESR and CRP per neruo recs.

## 2014-12-14 NOTE — Assessment & Plan Note (Signed)
BP Readings from Last 3 Encounters:  12/14/14 156/58  12/10/14 176/69  11/08/14 157/61    Lab Results  Component Value Date   NA 141 12/09/2014   K 4.0 12/09/2014   CREATININE 1.73* 12/09/2014    Assessment: Blood pressure control:  elevated Progress toward BP goal:   improved Comments: Patient is currently taking Carvedilol 25 mg bid, Chlortalidone 25 mg daily, Imdur 60 mg daily and Lisinopril 40 mg daily. Reports compliance with these medications.   Plan: Medications:  Will increase Chlorthalidone to 50 mg daily  Educational resources provided:   Self management tools provided:   Other plans: Check BMP today, RTC in 2 weeks for BP recheck

## 2014-12-14 NOTE — Progress Notes (Signed)
Patient ID: Stephanie Gross, female   DOB: [birth year], 69 y.o.   MRN: [id number]   Subjective:   Patient ID: Stephanie Gross female   DOB: 22-Nov-1945 69 y.o.   MRN: [id number]  HPI: [name] is a 69 y.o. female with a PMH of HTN, DM type 2, CAD, HLD, CVA, tobacoo abuse presenting to clinic today for hospital follow up.  Please refer to problem based charting for details of today's visit.  Past Medical History  Diagnosis Date  . Hypertension   . Diabetes mellitus without complication   . CAD (coronary artery disease)     s/p RCA stent 2007. Myoview 2010 normal  . DM (diabetes mellitus)   . HTN (hypertension)   . Hyperlipidemia   . GERD (gastroesophageal reflux disease)   . 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 Arthritis   . Obesity   . AVD (aortic valve disease)   . Vitamin D deficiency   . Chronic obstructive bronchitis without exacerbation   . Derangement of left knee   . Degeneration of meniscus of knee    Current Outpatient Prescriptions  Medication Sig Dispense Refill  . aspirin EC 81 MG tablet Take 81 mg by mouth daily.    Marland Kitchen atorvastatin (LIPITOR) 40 MG tablet Take 1 tablet (40 mg total) by mouth daily. 90 tablet 0  . Blood Glucose Monitoring Suppl (AGAMATRIX PRESTO PRO METER) DEVI 1 Device by Does not apply route 4 (four) times daily. 1 Device 0  . carvedilol (COREG) 25 MG tablet Take 1 tablet (25 mg total) by mouth 2 (two) times daily with a meal. 180 tablet 3  . chlorthalidone (HYGROTON) 50 MG tablet Take 1 tablet (50 mg total) by mouth daily. 90 tablet 3  . clopidogrel (PLAVIX) 75 MG tablet Take 1 tablet (75 mg total) by mouth daily. 90 tablet 3  . glucose blood (AGAMATRIX PRESTO TEST) test strip Use as instructed 100 each 12  . glucose blood (ONE TOUCH ULTRA TEST) test strip Check blood sugar before and after meals and bedtime 250.00 insulin requiring 150 each 12  . insulin aspart (NOVOLOG  FLEXPEN) 100 UNIT/ML FlexPen Inject 1U per carbohydrate and 1 units per 40mg /dL increment blood sugar over 140, 60-99 inject 2 units, 100-140 inject 3 units, 141-180 inject 4 units, 181-220 inject 5 units, 221-260 inject 6 units, 261-300 inject 7 units, 301-340 inject 8 units (Patient taking differently: Inject 1-5 Units into the skin 3 (three) times daily with meals. Inject 1U per carbohydrate and 1 units per 40mg /dL increment blood sugar over 140, 60-99 inject 2 units, 100-140 inject 3 units, 141-180 inject 4 units, 181-220 inject 5 units, 221-260 inject 6 units, 261-300 inject 7 units, 301-340 inject 8 units) 15 mL 2  . insulin detemir (LEVEMIR) 100 UNIT/ML injection Inject 0.4 mLs (40 Units total) into the skin at bedtime. 10 mL 11  . isosorbide mononitrate (IMDUR) 60 MG 24 hr tablet Take 1 tablet (60 mg total) by mouth daily. 180 tablet 3  . lansoprazole (PREVACID) 30 MG capsule Take 1 capsule (30 mg total) by mouth daily at 12 noon. (Patient taking differently: Take 30 mg by mouth daily as needed (for acid reflux, heartburn). ) 30 capsule 1  . lisinopril (PRINIVIL,ZESTRIL) 40 MG tablet Take 1 tablet (40 mg total) by mouth daily. 90 tablet 3   No current facility-administered medications for this visit.   Family History  Problem Relation Age of  Onset  . Stroke Mother   . Diabetes Mother   . Diabetes Maternal Grandmother   . Diabetes Sister   . Hypertension Mother    Social History   Social History  . Marital Status: Single    Spouse Name: N/A  . Number of Children: N/A  . Years of Education: N/A   Occupational History  . unemployed    Social History Main Topics  . Smoking status: Current Every Day Smoker -- 0.30 packs/day for 30 years    Types: Cigarettes  . Smokeless tobacco: Never Used     Comment: cutting back / 1/2 PPD. stopped 05/28/2014  . Alcohol Use: No  . Drug Use: No  . Sexual Activity: Not Asked   Other Topics Concern  . None   Social History Narrative   **  Merged History Encounter **       Patient recently lost her job and is currently without insurance. - Nov 2012   Review of Systems: Review of Systems  Constitutional: Negative for fever and chills.  Eyes: Negative for blurred vision and double vision.  Respiratory: Negative for shortness of breath.   Cardiovascular: Negative for chest pain and palpitations.  Gastrointestinal: Negative for nausea, vomiting, abdominal pain, diarrhea and constipation.  Skin: Negative for rash.  Neurological: Positive for sensory change. Negative for dizziness, tingling, speech change, focal weakness and headaches.   Objective:  Physical Exam: Filed Vitals:   12/14/14 1024  BP: 156/58  Pulse: 64  Temp: 98.1 F (36.7 C)  TempSrc: Oral  Height: 5\' 5"  (1.651 m)  Weight: 134 lb 14.4 oz (61.19 kg)  SpO2: 100%   General appearance: alert, cooperative and appears stated age Head: Normocephalic, without obvious abnormality, atraumatic Eyes: conjunctivae/corneas clear. PERRL, EOM's intact. Fundi benign. Lungs: clear to auscultation bilaterally Heart: regular rate and rhythm, S1, S2 normal, no murmur, click, rub or gallop Abdomen: soft, non-tender; bowel sounds normal; no masses,  no organomegaly Extremities: extremities normal, atraumatic, no cyanosis or edema Pulses: 2+ and symmetric Neurologic: Mental status: Alert, oriented, thought content appropriate Cranial nerves: I: sense of smell present, II: visual acuity normal bilaterally, II: visual field decreased peripheral fields bilaterally, II: pupils equal, round, reactive to light and accommodation, III,VII: ptosis not present, III,IV,VI: extraocular muscles extra-ocular motions intact, V: mastication normal, V: facial light touch sensation reduced on the left, V,VII: corneal reflex not done bilaterally, VII: upper facial muscle function normal bilaterally, VII: lower facial muscle function normal bilaterally, VIII: hearing normal, IX: soft palate elevation  normal bilaterally, XI: sternocleidomastoid strength normal bilaterally, XII: tongue strength abnormal  Sensory: normal Motor: grossly normal Coordination: normal Gait: Normal   Assessment & Plan:   Case discussed with Dr. Daryll Drown. Please refer to Problem based carting for further details of today's visit.

## 2014-12-15 ENCOUNTER — Other Ambulatory Visit: Payer: Self-pay | Admitting: Pharmacist

## 2014-12-15 ENCOUNTER — Encounter: Payer: Self-pay | Admitting: Pharmacist

## 2014-12-15 ENCOUNTER — Ambulatory Visit (HOSPITAL_COMMUNITY): Admission: RE | Admit: 2014-12-15 | Payer: Medicare Other | Source: Ambulatory Visit

## 2014-12-15 LAB — BASIC METABOLIC PANEL
BUN/Creatinine Ratio: 19 (ref 11–26)
BUN: 36 mg/dL — AB (ref 8–27)
CALCIUM: 9.9 mg/dL (ref 8.7–10.3)
CO2: 20 mmol/L (ref 18–29)
CREATININE: 1.92 mg/dL — AB (ref 0.57–1.00)
Chloride: 105 mmol/L (ref 97–108)
GFR calc Af Amer: 31 mL/min/{1.73_m2} — ABNORMAL LOW (ref >59)
GFR, EST NON AFRICAN AMERICAN: 27 mL/min/{1.73_m2} — AB (ref >59)
Glucose: 141 mg/dL — ABNORMAL HIGH (ref 65–99)
Potassium: 4.2 mmol/L (ref 3.5–5.2)
Sodium: 143 mmol/L (ref 134–144)

## 2014-12-15 LAB — C-REACTIVE PROTEIN: CRP: 78.7 mg/L — AB (ref 0.0–4.9)

## 2014-12-15 LAB — SEDIMENTATION RATE: Sed Rate: 22 mm/hr (ref 0–40)

## 2014-12-15 NOTE — Assessment & Plan Note (Addendum)
Lipid Panel     Component Value Date/Time   CHOL 190 12/09/2014 0612   TRIG 134 12/09/2014 0612   HDL 40* 12/09/2014 0612   CHOLHDL 4.8 12/09/2014 0612   VLDL 27 12/09/2014 0612   LDLCALC 123* 12/09/2014 0612   LDLDIRECT 111* 09/15/2008 2047   Started on Lipitor 40 mg daily following hospitalization on 12/08/2014 for CVA.   ADDENDUM: Patient called in with complaints of diarrhea from the Lipitor. She spoke with Dr. Maudie Mercury and was advised to break the pill in half. Symptoms have resolved on the lower dose. Needs to be on a statin even if its every other day, 3 times a week, 2 times a week or even weekly (has been shown to lower LDL at even weekly doses) if she cannot tolerate daily.

## 2014-12-15 NOTE — Patient Outreach (Signed)
[facility]) Care Management  12/15/2014  Stephanie Gross 10/09/1945 [id number]   Request from Mariann Laster, RN to assign Pharmacy and SW, assigned Deanne Coffer, PharmD and [employer], [place].  Thanks, Ronnell Freshwater. [place], [place] Assistant Phone: [phone] Fax: [phone]

## 2014-12-15 NOTE — Patient Outreach (Signed)
[facility]) Care Management  [place]   12/15/2014  Stephanie Gross 04-Oct-1945 [id number]  Subjective: Stephanie Gross is a 69 y.o. female who was referred to [place] for medication assistance. Patient reported issues with affording Crestor as well as insulin.   Patient has [facility] and already uses mail order for some of her prescriptions. She has also been assisted by the pharmacist, Mannie Stabile, at [facility]. She assisted the patient with setting up the mail order to save on prescription cost. She recommended switching the patient to OTC insulin to save on cost. Patient has also already been switched from Crestor (rosuvastatin) to atorvastatin.   Patient's husband had reported that the patient's medication regimens constantly change after hospital admissions.   Objective:   Current Medications: Current Outpatient Prescriptions  Medication Sig Dispense Refill  . aspirin EC 81 MG tablet Take 81 mg by mouth daily.    Marland Kitchen atorvastatin (LIPITOR) 40 MG tablet Take 1 tablet (40 mg total) by mouth daily. 90 tablet 0  . Blood Glucose Monitoring Suppl (AGAMATRIX PRESTO PRO METER) DEVI 1 Device by Does not apply route 4 (four) times daily. 1 Device 0  . carvedilol (COREG) 25 MG tablet Take 1 tablet (25 mg total) by mouth 2 (two) times daily with a meal. 180 tablet 3  . chlorthalidone (HYGROTON) 50 MG tablet Take 1 tablet (50 mg total) by mouth daily. 90 tablet 3  . clopidogrel (PLAVIX) 75 MG tablet Take 1 tablet (75 mg total) by mouth daily. 90 tablet 3  . glucose blood (AGAMATRIX PRESTO TEST) test strip Use as instructed 100 each 12  . glucose blood (ONE TOUCH ULTRA TEST) test strip Check blood sugar before and after meals and bedtime 250.00 insulin requiring 150 each 12  . insulin aspart (NOVOLOG FLEXPEN) 100 UNIT/ML FlexPen Inject 1U per carbohydrate and 1 units per 40mg /dL increment blood sugar over 140, 60-99 inject  2 units, 100-140 inject 3 units, 141-180 inject 4 units, 181-220 inject 5 units, 221-260 inject 6 units, 261-300 inject 7 units, 301-340 inject 8 units (Patient taking differently: Inject 1-5 Units into the skin 3 (three) times daily with meals. Inject 1U per carbohydrate and 1 units per 40mg /dL increment blood sugar over 140, 60-99 inject 2 units, 100-140 inject 3 units, 141-180 inject 4 units, 181-220 inject 5 units, 221-260 inject 6 units, 261-300 inject 7 units, 301-340 inject 8 units) 15 mL 2  . insulin detemir (LEVEMIR) 100 UNIT/ML injection Inject 0.4 mLs (40 Units total) into the skin at bedtime. 10 mL 11  . isosorbide mononitrate (IMDUR) 60 MG 24 hr tablet Take 1 tablet (60 mg total) by mouth daily. 180 tablet 3  . lansoprazole (PREVACID) 30 MG capsule Take 1 capsule (30 mg total) by mouth daily at 12 noon. (Patient taking differently: Take 30 mg by mouth daily as needed (for acid reflux, heartburn). ) 30 capsule 1  . lisinopril (PRINIVIL,ZESTRIL) 40 MG tablet Take 1 tablet (40 mg total) by mouth daily. 90 tablet 3   No current facility-administered medications for this visit.    Functional Status: In your present state of health, do you have any difficulty performing the following activities: 12/14/2014 12/09/2014  Hearing? N N  Vision? N N  Difficulty concentrating or making decisions? N N  Walking or climbing stairs? N N  Dressing or bathing? N N  Doing errands, shopping? N N    Fall/Depression Screening: PHQ 2/9 Scores  12/14/2014 11/08/2014 11/01/2014 09/14/2014 06/02/2014 04/14/2014 11/25/2013  PHQ - 2 Score 0 0 0 0 0 0 0    Assessment: 1. Medication assistance: patient has been assisted by the pharmacy at [facility]. I am not sure that any other assistance is available beyond Medicare Extra Help or Medicaid at this time beyond switching insulin to OTC insulin and her physician is following and is prepared to make that change if needed. I cannot prevent her medication  regimens from changing during hospital admissions but I do think that getting her affordable access to her medications can prevent further admissions.  Plan: 1. Medication assistance: Will refer patient to [place], care management assistant, for assistance with applying for Medicare Extra Help.  Patient has already been referred to [place] [facility] for assistance with Medicaid application. Mannie Stabile, PharmD is continuing to follow from the patient's primary care office. Will remove myself from Care Team and close pharmacy program.    Stephanie Gross, PharmD, [place] [phone]

## 2014-12-19 ENCOUNTER — Other Ambulatory Visit: Payer: Self-pay

## 2014-12-19 NOTE — Patient Outreach (Signed)
[facility]) Care Management  12/19/2014  Stephanie Gross 1946/01/14 [id number]  EMMI Stroke Program (Week 2) Insurance: AARP/UHC/MCR   Outreach call to patient. Patient's husband, Quitman Livings reached. States patient is not at home and has gone to visit her sister.   Patient completed follow-up appt with Dr. Charlynn Grimes on 12/14/14.   Per Dr. Shanna Cisco note; patient had not scheduled her Neurology follow-up appt.    RN CM encouraged husband to contact MD to schedule appt and discussed importance of keeping all appointments.   Medications: MD has changed Crestor to Lipitor due to cost issues. Pharmacy consults completed regarding cost issues.   H/O husband is on disability and SHIP program and household income is very small.  Medicaid: None - H/O husband does not think that wife has ever applied for MCD. (SW referral sent 12/14/14)  Consent Patient and husband agree to next call within one week.  RN CM advised to please notify MD of any changes in condition prior to scheduled appt's.   RN CM provided contact name and #, 24-hour nurse line # [phone].   RN CM confirmed patient is aware of 911 services for urgent emergency needs.  Plan:  RN CM will continue to monitor adherence to scheduling Neurology follow-up appt.  RN CM will continue to follow for outcomes regarding SW referral for Financial Assessment and possible Medicaid Application sent [phone] RN CM will continue to monitor for medication affordability issues and resolution.   Pharmacy Referral sent for affordability issues 12/14/14 and follow-up complete.  RN CM will follow-up with EMMI STROKE Program week 3 within one week.   Mariann Laster, RN, BSN, [facility], CCM  [employer] Management Coordinator [phone] Direct [phone] Cell [phone] Office [phone] Fax

## 2014-12-21 NOTE — Patient Outreach (Signed)
[facility]) Care Management  12/21/2014  Stephanie Gross 01-13-1946 [id number]   Telephone call from patient, returning my phone call. Spoke with patient about applying for extra help through social security and based off income, she does not qualify to apply. She stated that she did not know if she will be returning back to work after her stroke and if she doesn't her income would drop. I told her to call me if she decides to stop working and we could look at applying at that time. No further action is needed from me as I'm closing my case.  Daijon Wenke L. Sanna Porcaro, [place] Care Management Assistant

## 2014-12-21 NOTE — Patient Outreach (Signed)
[facility]) Care Management  12/21/2014  ASHLEY HOBER 1946/04/15 [id number]   Notification received from Nicoletta Ba, PharmD to outreach patient to assist in applying for extra help. Telephoned patient and I had to leave a HIPPA compliant message for her to call me back.  Kerrigan Gombos L. Viggo Perko, [place] Care Management Assistant

## 2014-12-22 ENCOUNTER — Telehealth: Payer: Self-pay | Admitting: Pharmacist

## 2014-12-22 ENCOUNTER — Other Ambulatory Visit: Payer: Self-pay | Admitting: *Deleted

## 2014-12-22 ENCOUNTER — Encounter: Payer: Self-pay | Admitting: *Deleted

## 2014-12-22 DIAGNOSIS — E1151 Type 2 diabetes mellitus with diabetic peripheral angiopathy without gangrene: Secondary | ICD-10-CM

## 2014-12-22 NOTE — Patient Outreach (Signed)
[facility]) Care [place]  12/22/2014  Stephanie Gross Jul 27, 1945 [id number]   CSW was able to make initial contact with patient today to perform phone assessment, as well as assess and assist with social work needs and services. CSW introduced self, explained role and types of services provided through [employer].  CSW further explained the reason for the call, indicating that Stephanie Gross, Telephonic Nurse Case Manager with [place] [facility], reported that patient may benefit from social work services and Gross.  More specifically, [name] reported that patient may be interested in receiving financial assistance to help pay medical expenses, as well as monthly bills. CSW was able to obtain two HIPAA compliant identifiers from patient, name and date of birth.  Patient reported that her husband, Stephanie Gross was receiving full Adult Medicaid coverage through the [place], prior to them getting married.  However, Stephanie Gross coverage was terminated when he and patient's Social Security incomes were combined.  Patient and Stephanie Gross have since tried to apply for full Adult Medicaid coverage for both of them, but have been told by a representative with the Department of Social Services that they are over-qualified.  Patient went on to say that Stephanie Gross was approved for [facility] ([employer] ) and Disability, but only to help supplement his income.  Patient is currently waiting to see if she is eligible to receive Stephanie Help ([place]) to assist with paying for her prescription medications.  A pharmacy consult has been placed with Stephanie Gross.   CSW agreed to mail patient an Adult Medicaid application, despite patient denying being eligible for coverage.  CSW inquired as to whether or not patient would be interested in having CSW  assist with completion, but patient reported that she is able to complete the application without assistance.  CSW also agreed to mail patient a list of Stephanie Gross that may be able to assist with finances.  Last, CSW will mail patient a Hardship Application through the [place] to see if patient is eligible to receive a percentage of her medical expenses written off.   Patient was most appreciative of the call and list of Gross provided.  CSW inquired as to how CSW could be of further assistance to patient at this time.  Patient denied being able to identify any additional social work needs at present.  CSW provided patient with CSW's contact information, encouraging patient to contact CSW directly if additional social work needs are identified.  Patient voiced understanding and was agreeable to this plan.  Packet of resource information will be mailed to patient's home today.  Case closure performed.  CSW will no longer make arrangements to contact patient by phone or in person, as all goals of treatment have been met from social work standpoint and no additional social work needs have been identified at this time. CSW will notify patient's RNCM with [place], Stephanie Gross of CSW's plans to close patient's case. CSW will submit a case closure request to Stephanie Gross, Care Gross Assistant with [place] [facility], in the form of a message, ensuring that Stephanie Gross is aware of Stephanie Gross's, RNCM with [place] [facility], continued involvement with patient's care. CSW will fax a correspondence letter to patient's Primary Care Physician, Stephanie Gross to report social work involvement with patient.  Stephanie Gross, BSW, MSW,  [place] [facility] [address], [place], [place] [address] [email or web address] [phone]

## 2014-12-22 NOTE — Progress Notes (Signed)
Internal Medicine Clinic Attending  I saw and evaluated the patient.  I personally confirmed the key portions of the history and exam documented by Dr. Boswell and I reviewed pertinent patient test results.  The assessment, diagnosis, and plan were formulated together and I agree with the documentation in the resident's note. 

## 2014-12-22 NOTE — Telephone Encounter (Signed)
Patient called statin that she was experiencing diarrhea from the atorvastatin 40 mg/day. Advised patient to take 1/2 tablet PO daily.  Also discussed case with Dr. Charlynn Grimes who worked with patient 12/14/14 and considering various factors including recent hospitalization and history of med adherence concerns due to cost, would like a re-trial of metformin therapy. Will pursue depending on renal function.  Patient has an upcoming clinic visit on 12/28/14.

## 2014-12-26 ENCOUNTER — Other Ambulatory Visit: Payer: Self-pay

## 2014-12-26 NOTE — Patient Outreach (Signed)
[facility]) Care Management  12/26/2014  Stephanie Gross 08/07/1945 [id number]   EMMI Stroke Program (Week 3)   Telephonic outreach call #1 (Week 3).  Patient not reached.   RN CM left HIPAA compliant voice message with name and number.  RN CM rescheduled for next outreach call within 1 week.   Mariann Laster, RN, BSN, [facility], CCM  [employer] Management Coordinator [phone] Direct [phone] Cell [phone] Office [phone] Fax

## 2014-12-27 ENCOUNTER — Other Ambulatory Visit: Payer: Self-pay

## 2014-12-27 NOTE — Patient Outreach (Signed)
[facility]) Care Management  12/27/2014  Ilyanna Biber Wright-McQueen 11-Aug-1945 [id number]   EMMI Stroke Program (Week 3)  Telephonic outreach call #2  (Week 3). Patient not reached.  RN CM left HIPAA compliant voice message with name and number.  RN CM rescheduled for next outreach call within 1 week.   Mariann Laster, RN, BSN, [facility], CCM  [employer] Management Coordinator [phone] Direct [phone] Cell [phone] Office [phone] Fax

## 2014-12-28 ENCOUNTER — Encounter: Payer: Self-pay | Admitting: Pharmacist

## 2014-12-28 ENCOUNTER — Other Ambulatory Visit: Payer: Self-pay

## 2014-12-28 ENCOUNTER — Ambulatory Visit (INDEPENDENT_AMBULATORY_CARE_PROVIDER_SITE_OTHER): Payer: PRIVATE HEALTH INSURANCE | Admitting: Internal Medicine

## 2014-12-28 VITALS — BP 183/65 | HR 71 | Temp 98.1°F | Wt 136.8 lb

## 2014-12-28 DIAGNOSIS — I10 Essential (primary) hypertension: Secondary | ICD-10-CM

## 2014-12-28 DIAGNOSIS — I639 Cerebral infarction, unspecified: Secondary | ICD-10-CM

## 2014-12-28 DIAGNOSIS — Z8673 Personal history of transient ischemic attack (TIA), and cerebral infarction without residual deficits: Secondary | ICD-10-CM

## 2014-12-28 DIAGNOSIS — F17200 Nicotine dependence, unspecified, uncomplicated: Secondary | ICD-10-CM

## 2014-12-28 DIAGNOSIS — E1110 Type 2 diabetes mellitus with ketoacidosis without coma: Secondary | ICD-10-CM

## 2014-12-28 DIAGNOSIS — Z7982 Long term (current) use of aspirin: Secondary | ICD-10-CM | POA: Diagnosis not present

## 2014-12-28 DIAGNOSIS — Z720 Tobacco use: Secondary | ICD-10-CM

## 2014-12-28 DIAGNOSIS — Z794 Long term (current) use of insulin: Secondary | ICD-10-CM

## 2014-12-28 DIAGNOSIS — Z23 Encounter for immunization: Secondary | ICD-10-CM | POA: Diagnosis not present

## 2014-12-28 DIAGNOSIS — E119 Type 2 diabetes mellitus without complications: Secondary | ICD-10-CM | POA: Diagnosis not present

## 2014-12-28 MED ORDER — ATORVASTATIN CALCIUM 40 MG PO TABS: 40.0000 mg | ORAL_TABLET | ORAL | Status: DC

## 2014-12-28 MED ORDER — INSULIN DETEMIR 100 UNIT/ML ~~LOC~~ SOLN: 30.0000 [IU] | Freq: Every day | SUBCUTANEOUS | Status: DC

## 2014-12-28 MED ORDER — METFORMIN HCL 500 MG PO TABS: 500.0000 mg | ORAL_TABLET | Freq: Every day | ORAL | Status: DC

## 2014-12-28 NOTE — Patient Instructions (Addendum)
It was good to see you Ms. Wright-McQueen.  I have filled out your return to work forms for you.  For your high blood pressure, please check at home once a day and write down your numbers and bring it to clinic on your next visit.  For your diabetes, please decrease your Levemir to 30 Units a day and you may stop using your Novolog. We will start you on Metformin 500 mg once a day. Please bring your meter next time you visit.  Please try taking your cholesterol medication Lipitor 40 mg (Atorvastatin) every other day and see how you can tolerate.  I think it is great that you want to quit smoking. You can get Nicotine patches over the counter. If you have cravings while using the patch you can try Nicotine gum or lozenges. Please also consider calling 1-800-QUIT-NOW for more counseling.  We have also given you the Pneumonia and Flu vaccinations today.  Please follow up with [place] in 2 weeks.

## 2014-12-28 NOTE — Progress Notes (Signed)
Patient ID: Stephanie Gross, female   DOB: 31-Aug-1945, 69 y.o.   MRN: [id number]   Subjective:   Patient ID: Stephanie Gross female   DOB: 1945/11/17 69 y.o.   MRN: [id number]  HPI: Stephanie Gross is a 69 y.o. female with PMH as listed below presenting for follow up for blood pressure control, medication management for her diabetes, and smoking cessation. She is also here to receive her influenza and pneumococcal vaccinations.  Please see problem list for details.   Past Medical History  Diagnosis Date  . Hypertension   . Diabetes mellitus without complication   . CAD (coronary artery disease)     s/p RCA stent 2007. Myoview 2010 normal  . DM (diabetes mellitus)   . HTN (hypertension)   . Hyperlipidemia   . GERD (gastroesophageal reflux disease)   . 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 Arthritis   . Obesity   . AVD (aortic valve disease)   . Vitamin D deficiency   . Chronic obstructive bronchitis without exacerbation   . Derangement of left knee   . Degeneration of meniscus of knee    Current Outpatient Prescriptions  Medication Sig Dispense Refill  . aspirin EC 81 MG tablet Take 81 mg by mouth daily.    Marland Kitchen atorvastatin (LIPITOR) 40 MG tablet Take 1 tablet (40 mg total) by mouth every other day. 90 tablet 0  . Blood Glucose Monitoring Suppl (AGAMATRIX PRESTO PRO METER) DEVI 1 Device by Does not apply route 4 (four) times daily. 1 Device 0  . carvedilol (COREG) 25 MG tablet Take 1 tablet (25 mg total) by mouth 2 (two) times daily with a meal. 180 tablet 3  . chlorthalidone (HYGROTON) 50 MG tablet Take 1 tablet (50 mg total) by mouth daily. 90 tablet 3  . clopidogrel (PLAVIX) 75 MG tablet Take 1 tablet (75 mg total) by mouth daily. 90 tablet 3  . glucose blood (AGAMATRIX PRESTO TEST) test strip Use as instructed 100 each 12  . glucose blood (ONE TOUCH ULTRA TEST) test strip Check blood sugar before and after  meals and bedtime 250.00 insulin requiring 150 each 12  . insulin detemir (LEVEMIR) 100 UNIT/ML injection Inject 0.3 mLs (30 Units total) into the skin at bedtime. 10 mL 11  . isosorbide mononitrate (IMDUR) 60 MG 24 hr tablet Take 1 tablet (60 mg total) by mouth daily. 180 tablet 3  . lansoprazole (PREVACID) 30 MG capsule Take 1 capsule (30 mg total) by mouth daily at 12 noon. (Patient taking differently: Take 30 mg by mouth daily as needed (for acid reflux, heartburn). ) 30 capsule 1  . lisinopril (PRINIVIL,ZESTRIL) 40 MG tablet Take 1 tablet (40 mg total) by mouth daily. 90 tablet 3  . metFORMIN (GLUCOPHAGE) 500 MG tablet Take 1 tablet (500 mg total) by mouth daily with breakfast. 30 tablet 3   No current facility-administered medications for this visit.   Family History  Problem Relation Age of Onset  . Stroke Mother   . Diabetes Mother   . Diabetes Maternal Grandmother   . Diabetes Sister   . Hypertension Mother    Social History   Social History  . Marital Status: Single    Spouse Name: N/A  . Number of Children: N/A  . Years of Education: N/A   Occupational History  . unemployed    Social History Main Topics  . Smoking status: Current Every Day Smoker --  0.30 packs/day for 30 years    Types: Cigarettes  . Smokeless tobacco: Never Used     Comment: cutting back / 1/2 PPD. stopped 05/28/2014  . Alcohol Use: No  . Drug Use: No  . Sexual Activity: Not on file   Other Topics Concern  . Not on file   Social History Narrative   ** Merged History Encounter **       Patient recently lost her job and is currently without insurance. - Nov 2012   Review of Systems: Review of Systems  Constitutional: Positive for weight loss. Negative for fever and chills.  Respiratory: Negative for cough, shortness of breath and wheezing.   Cardiovascular: Negative for chest pain and leg swelling.  Gastrointestinal: Negative for nausea, vomiting and abdominal pain.  Musculoskeletal:  Negative for myalgias and falls.  Skin: Negative for itching and rash.  Neurological: Negative for dizziness, tingling, sensory change, focal weakness and headaches.    Objective:  Physical Exam: Filed Vitals:   12/28/14 1316  BP: 183/65  Pulse: 71  Temp: 98.1 F (36.7 C)  TempSrc: Oral  Weight: 136 lb 12.8 oz (62.052 kg)  SpO2: 100%   Physical Exam  Constitutional: She is oriented to person, place, and time. She appears well-developed and well-nourished.  HENT:  Head: Normocephalic and atraumatic.  Eyes: EOM are normal. Pupils are equal, round, and reactive to light.  Cardiovascular: Normal rate and regular rhythm.   Pulmonary/Chest: Effort normal and breath sounds normal.  Abdominal: Soft. There is no tenderness.  Musculoskeletal: Normal range of motion.  Neurological: She is alert and oriented to person, place, and time. She has normal strength. No sensory deficit. She exhibits normal muscle tone. Coordination and gait normal.    Assessment & Plan:  Please see problem based charting for assessment and plan.

## 2014-12-28 NOTE — Patient Outreach (Signed)
[facility]) Care Management  12/28/2014  Stephanie Gross 1945-11-09 [id number]  EMMI Stroke Program (Week 3)  Telephonic outreach call #3 (Week 3). Patient not reached.  RN CM left HIPAA compliant voice message with name and number.  Plan:   RN CM sent unsuccessful outreach letter.   RN CM rescheduled for next outreach call within 1 week ([place] 4)  Mariann Laster, RN, BSN, [facility], [place] Management Care Management Coordinator [phone] Direct [phone] Cell [phone] Office [phone] Fax

## 2014-12-28 NOTE — Progress Notes (Signed)
69 year old female with uncontrolled diabetes and hypertension, with history of stroke, coming for follow up. Seen and examined patient with Dr Posey Pronto.   Diabetes - patient has not brought her glucometer but reports blood sugars mostly in 70s and 80s all through out the day. She is hypoglycemia unaware. She is supposed to be on 40 units of Levemir and Novolog SS. Today, we decreased her Levemir to 30 units, stopped novolog and started metformin 500 mg. She will follow up in 2 weeks. Asked to bring glucometer.  Hypertension - Patient's repeat blood pressure was better, however the patient's compliance has been questionable in the past although today she reports compliance. No change in medications for now. Patient has a BP machine and will maintain a BP log. Also asked patient to bring her BP machine to clinic for calibration.  Trying to quit smoking. Advice about use of nicotine patches and gums given. Plan discussed with Dr Zada Finders. Madilyn Fireman MD MPH 12/28/2014 2:56 PM

## 2014-12-28 NOTE — Progress Notes (Signed)
Patient ID: Stephanie Gross, female   DOB: [birth year], 69 y.o.   MRN: [id number]  Medication(s) were reviewed with the patient, including name, instructions, indication, goals of therapy, potential side effects, importance of adherence, and safe use.  Patient verbalized understanding by repeating back information and was advised to contact me if further medication-related questions arise or if further help needed with tobacco cessation.

## 2014-12-29 NOTE — Assessment & Plan Note (Signed)
Patient continues with desire to quit smoking and states that she gets cravings and is struggling with smoking cessation. She states that when she gets cravings she will light up a cigarette and take 1-2 drags and stop, then start again 2-3 hours later. Patient does not qualify for Varenicline (Chantix) due previous side effects and that she feels it made her "crazy." She does not want to try bupropion at this time and is inquiring about using a nicotine patch, which she would like to try. -Patient commended for wanting to quit smoking and given information on smoking cessation as well as advise to call 1-800-QUIT-NOW for further counseling. -Patient counseled on Nicotine patch use, use of Nicotine gum or lozenge for cravings while on patch, and not to smoke while on patch. She understands and will try OTC nicotine patch.

## 2014-12-29 NOTE — Assessment & Plan Note (Signed)
BP Readings from Last 3 Encounters:  12/28/14 183/65  12/19/14 156/58  12/14/14 156/58   Patient continues to have elevated blood pressures with initial of 183/65 on arrival to clinic. Repeat BP is still high at 160/70 but a bit more reassuring as that is near her previous BP readings. Patient currently taking Carvedilol 25 mg BID, Chlorthalidone 50 mg daily which was increased from 25 mg last visit, Imdur 60 mg daily, and Lisinopril 40 mg daily. She reports compliance with these medications. -Will not make any changes to medication for now -Patient has BP machine at home and advised to check once a day and log readings and time of day. She agrees and will return in 2 weeks with BP log.

## 2014-12-29 NOTE — Assessment & Plan Note (Signed)
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 She had previously reported decreased sensation on left side of her face, loss of sensation of taste, and was seen to have mild tongue deviation to the left. Today her neurologic exam was normal without the above deficits and she denies anymore change in taste. Patient taking ASA, Plavix, Atorvastatin 40 mg 1/2 tablet daily, and Lisinopril 40 mg daily. She states that the Atrovastatin is causing her to have some GI upset symptoms of stomach aches even at the 1/2 tablet dose. Lab results from previous visit due to some concern for Giant Cell Arteritis are: ESR 22, CRP 78.7. Patient denies any headaches or temporal pain.  -Patient to follow up with Neurology and Ophtalmology -Patient will try Atorvastatin 40 mg every other day and report back on tolerance -Paperwork to return to work completed 

## 2014-12-29 NOTE — Assessment & Plan Note (Signed)
Per last office visit, patient had concerns about affordability of her insulin Levemir and had discussions on switching to a more affordable NPH regimen. Spoke with patient today and she states that she would like to continue her Levemir at lower doses. She currently takes 40 units levemir at night and 1-2 units novolog with meals. Patient did not bring her glucometer on this visit but reports seeing blood sugars around 80-85 in the AM and 75-80 around noon. Her Hgb A1c on 12/08/14 was 7.0. Patient also counseled on starting Metformin 500 mg once a day as there is cardioprotective benefits and will slowly titrate up as needed to avoid GI side effects. -Decrease Levemir to 30 Units qhs -Patient advised to continue to check blood sugar at home and bring glucometer on follow up visit -Start Metformin XR 500 mg once a day -Stop Novolog

## 2015-01-02 ENCOUNTER — Other Ambulatory Visit: Payer: Self-pay

## 2015-01-02 ENCOUNTER — Other Ambulatory Visit: Payer: Self-pay | Admitting: Internal Medicine

## 2015-01-02 NOTE — Addendum Note (Signed)
Addended by: Truddie Crumble on: 01/02/2015 12:08 PM   Modules accepted: Orders

## 2015-01-02 NOTE — Patient Outreach (Signed)
[facility]) Care Management  01/02/2015  Jolani Gindi Wright-McQueen 20-Dec-1945 [id number]  EMMI Stroke Program (Week 4) Insurance: AARP/UHC/MCR  Referral Date:  12/14/14 PCP:  Dr. Loleta Chance  Stroke:  RN CM confirmed patient has NOT scheduled Neurologic follow-up appt.  Patient states her copay is $65.00 and she does not have the money.   RN CM reviewed discharge summary for order and instructions.   Neurologist:  [facility]:  Stroke follow-up.  Patient to schedule appt 2 months post 12/10/14 discharge date.  [address] [place] Juarez [phone] RN CM offered to complete a conference call with patient to schedule appt.  RN CM encouraged to schedule appt but if unable to afford appt; to make sure patient notifies her Primary MD that she is not keeping this appt and request Primary MD follow her stroke needs as much as possible during this time.   RN CM provided other resource of the [place] if unable to afford health care services.   RN CM confirmed patient's awareness of Sx's of stroke.   Smoking Smoking cessation plan of care in place.  Patient is motivated to quit smoking and has been counseled by her MD and provided prescription management.    Medications: H/o MD has changed Crestor to Lipitor due to cost issues. H/o Pharmacy consults completed regarding cost issues.  Patient states all medication issues have been resolved.   Consent RN CM advised of [place] Stroke Program closure:  Services completed.   RN CM advised to please notify MD of any changes in condition prior to scheduled appt's.  RN CM provided contact name and #, 24-hour nurse line # [phone].  RN CM confirmed patient is aware of 911 services for urgent emergency needs.  Plan:  RN CM notified [place] - Case closed  RN CM notified Dr. Leonie Man via case closure letter.   Mariann Laster,  RN, BSN, [facility], CCM  [employer] Management Coordinator [phone] Direct [phone] Cell [phone] Office [phone] Fax

## 2015-01-04 NOTE — Patient Outreach (Signed)
[facility]) Care Management  01/04/2015  Stephanie Gross 05-Feb-1946 [id number]   Notification from Mariann Laster, RN to close case due to goals met with EMMI Stroke Transition.  Thanks, Ronnell Freshwater. [place], [place] Assistant Phone: [phone] Fax: [phone]

## 2015-01-11 ENCOUNTER — Other Ambulatory Visit: Payer: Self-pay | Admitting: Dietician

## 2015-01-11 ENCOUNTER — Ambulatory Visit (INDEPENDENT_AMBULATORY_CARE_PROVIDER_SITE_OTHER): Payer: PRIVATE HEALTH INSURANCE | Admitting: Internal Medicine

## 2015-01-11 ENCOUNTER — Encounter: Payer: Self-pay | Admitting: Internal Medicine

## 2015-01-11 VITALS — BP 181/64 | HR 69 | Temp 98.4°F | Wt 139.1 lb

## 2015-01-11 DIAGNOSIS — E1110 Type 2 diabetes mellitus with ketoacidosis without coma: Secondary | ICD-10-CM

## 2015-01-11 DIAGNOSIS — E1159 Type 2 diabetes mellitus with other circulatory complications: Secondary | ICD-10-CM

## 2015-01-11 DIAGNOSIS — I10 Essential (primary) hypertension: Secondary | ICD-10-CM

## 2015-01-11 DIAGNOSIS — F17200 Nicotine dependence, unspecified, uncomplicated: Secondary | ICD-10-CM

## 2015-01-11 DIAGNOSIS — E1310 Other specified diabetes mellitus with ketoacidosis without coma: Secondary | ICD-10-CM | POA: Diagnosis not present

## 2015-01-11 DIAGNOSIS — F17210 Nicotine dependence, cigarettes, uncomplicated: Secondary | ICD-10-CM

## 2015-01-11 MED ORDER — NICOTINE 14 MG/24HR TD PT24: 14.0000 mg | MEDICATED_PATCH | TRANSDERMAL | Status: DC

## 2015-01-11 MED ORDER — AMLODIPINE BESYLATE 5 MG PO TABS: 5.0000 mg | ORAL_TABLET | Freq: Every day | ORAL | Status: DC

## 2015-01-11 NOTE — Progress Notes (Signed)
Patient ID: Stephanie Gross, female   DOB: 12-19-1945, 69 y.o.   MRN: [id number]   Subjective:   Patient ID: Stephanie Gross female   DOB: 18-Nov-1945 68 y.o.   MRN: [id number]  HPI: Stephanie Gross is a 69 y.o. female with PMH as listed below who presents for follow up for management of HTN, DM, and tobacco cessation.  Please see problem list for details.  Past Medical History  Diagnosis Date  . Hypertension   . Diabetes mellitus without complication   . CAD (coronary artery disease)     s/p RCA stent 2007. Myoview 2010 normal  . DM (diabetes mellitus)   . HTN (hypertension)   . Hyperlipidemia   . GERD (gastroesophageal reflux disease)   . 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 Arthritis   . Obesity   . AVD (aortic valve disease)   . Vitamin D deficiency   . Chronic obstructive bronchitis without exacerbation   . Derangement of left knee   . Degeneration of meniscus of knee    Current Outpatient Prescriptions  Medication Sig Dispense Refill  . aspirin EC 81 MG tablet Take 81 mg by mouth daily.    Marland Kitchen atorvastatin (LIPITOR) 40 MG tablet Take 1 tablet (40 mg total) by mouth every other day. 90 tablet 0  . Blood Glucose Monitoring Suppl (AGAMATRIX PRESTO PRO METER) DEVI 1 Device by Does not apply route 4 (four) times daily. 1 Device 0  . carvedilol (COREG) 25 MG tablet Take 1 tablet (25 mg total) by mouth 2 (two) times daily with a meal. 180 tablet 3  . chlorthalidone (HYGROTON) 50 MG tablet Take 1 tablet (50 mg total) by mouth daily. 90 tablet 3  . clopidogrel (PLAVIX) 75 MG tablet Take 1 tablet (75 mg total) by mouth daily. 90 tablet 3  . glucose blood (AGAMATRIX PRESTO TEST) test strip Use as instructed 100 each 12  . glucose blood (ONE TOUCH ULTRA TEST) test strip Check blood sugar before and after meals and bedtime 250.00 insulin requiring 150 each 12  . insulin detemir (LEVEMIR) 100 UNIT/ML injection Inject  0.3 mLs (30 Units total) into the skin at bedtime. 10 mL 11  . isosorbide mononitrate (IMDUR) 60 MG 24 hr tablet Take 1 tablet (60 mg total) by mouth daily. 180 tablet 3  . lansoprazole (PREVACID) 30 MG capsule Take 1 capsule (30 mg total) by mouth daily at 12 noon. (Patient taking differently: Take 30 mg by mouth daily as needed (for acid reflux, heartburn). ) 30 capsule 1  . lisinopril (PRINIVIL,ZESTRIL) 40 MG tablet Take 1 tablet (40 mg total) by mouth daily. 90 tablet 3  . metFORMIN (GLUCOPHAGE) 500 MG tablet Take 1 tablet (500 mg total) by mouth daily with breakfast. 30 tablet 3  . amLODipine (NORVASC) 5 MG tablet Take 1 tablet (5 mg total) by mouth daily. 30 tablet 2  . LEVEMIR 100 UNIT/ML injection INJECT 40 UNITS INTO THE SKIN DAILY AT BEDTIME (Patient not taking: Reported on 01/11/2015) 36 mL 3  . nicotine (NICODERM CQ - DOSED IN MG/24 HOURS) 14 mg/24hr patch Place 1 patch (14 mg total) onto the skin daily. 30 patch 0   No current facility-administered medications for this visit.   Family History  Problem Relation Age of Onset  . Stroke Mother   . Diabetes Mother   . Diabetes Maternal Grandmother   . Diabetes Sister   . Hypertension Mother  Social History   Social History  . Marital Status: Single    Spouse Name: N/A  . Number of Children: N/A  . Years of Education: N/A   Occupational History  . unemployed    Social History Main Topics  . Smoking status: Current Every Day Smoker -- 0.30 packs/day for 30 years    Types: Cigarettes  . Smokeless tobacco: Never Used     Comment: cutting back / 1/2 PPD. stopped 05/28/2014  . Alcohol Use: No  . Drug Use: No  . Sexual Activity: Not Asked   Other Topics Concern  . None   Social History Narrative   ** Merged History Encounter **       Patient recently lost her job and is currently without insurance. - Nov 2012   Review of Systems: Review of Systems  Constitutional: Negative for fever and chills.  Respiratory:  Negative for cough, shortness of breath and wheezing.   Cardiovascular: Negative for chest pain and palpitations.  Gastrointestinal: Negative for nausea, vomiting, abdominal pain, diarrhea and constipation.  Genitourinary: Negative for dysuria and hematuria.  Musculoskeletal: Negative for myalgias, joint pain and falls.  Neurological: Negative for dizziness and headaches.  Endo/Heme/Allergies: Bruises/bleeds easily.    Objective:  Physical Exam: Filed Vitals:   01/11/15 1318  BP: 181/64  Pulse: 69  Temp: 98.4 F (36.9 C)  TempSrc: Oral  Weight: 139 lb 1.6 oz (63.095 kg)  SpO2: 100%   Physical Exam  Constitutional: She is oriented to person, place, and time. She appears well-developed and well-nourished. No distress.  HENT:  Head: Normocephalic and atraumatic.  Cardiovascular: Normal rate and regular rhythm.   ?  soft systolic murmur heard at RUS border.  Pulmonary/Chest: Effort normal and breath sounds normal. No respiratory distress. She has no wheezes.  Musculoskeletal: Normal range of motion. She exhibits no edema or tenderness.  Neurological: She is alert and oriented to person, place, and time.  Skin: Skin is warm.  Psychiatric: She has a normal mood and affect.    Assessment & Plan:  Please see problem based charting.

## 2015-01-11 NOTE — Assessment & Plan Note (Signed)
Patient has not tried to obtain OTC nicotine patch since last visit. She is still desiring to quit smoking and would still like to try the patch. -Will send rx for nicotine patch to her pharmacy. She is advised to choose more affordable option between prescription and OTC patch. -Patient again advised on use of nicotine lozenge or gum for cravings while on the patch

## 2015-01-11 NOTE — Telephone Encounter (Signed)
Patient is requesting a  new meter. She agreed to have one sent in and if it costs her money she can call to see if we have a sample to give her. Her plan benefit book says it covers the one touch ultra, one touch verio IQ and Sync and accu chek nano and aviva plus meter at no cost. It appears she also needs  rx for insulin syringes for her insurance to cover.

## 2015-01-11 NOTE — Assessment & Plan Note (Addendum)
Patient brought meter in today which shows average blood glucose of 176 with range of 122-195. She is currently taking Levemir 30 units at night and started Metformin 500 mg last visit, which she is tolerating. -Will check BMP to assess creatinine today, if elevated above 1.4 will stop Metformin and may need to consider stopping lisinopril as well if much higher  Addendum: Creatinine returns at 1.46 down from 1.92 4 weeks ago. Still higher than we would like, so will hold Metformin. I called patient and spoke to her to advise to hold metformin, she understands and agrees.

## 2015-01-11 NOTE — Progress Notes (Signed)
69 year old female with difficult to control hypertension and diabetes, and recent worsening of renal function. Cr 1.73>1.92. Would recheck BMP and if creatinine elevated greater than 1.4, I would hold metformin. If it continues to dynamically trend up, I would recommend holding lisinopril as well. For BP control, Amlodipine 5 mg (10mg  if lisinopril is held) to start. Patient to be called back in 2 weeks.  Case seen and examined with Dr Posey Pronto. Plan discussed with Dr Posey Pronto. Madilyn Fireman MD MPH 01/11/2015 2:14 PM

## 2015-01-11 NOTE — Assessment & Plan Note (Signed)
BP Readings from Last 3 Encounters:  01/11/15 181/64  12/28/14 183/65  12/19/14 156/58  Repeat BP today of 180/66. She is currently on Carvedilol 25 mg BID, Chlorthalidone 50 mg daily, Imdur 60 mg daily, and Lisinopril 40 mg daily. She reports compliance with these medications. Patient admits to very high salt intake. -Continue current medications -Add Amlodipine 5 mg daily -BMP to check creatinine, if elevated may need to consider discontinuing Lisinopril and increasing Amlodipine to 10 mg -f/u 2 weeks

## 2015-01-11 NOTE — Patient Instructions (Signed)
We will check your blood today to see your kidney function. If high, we may need to stop metformin and lisinopril.  I have sent a prescription for Amlodipine 5 mg to your pharmacy to take once a day.  I have sent a prescription for nicotine patches to your pharmacy. Over the counter patch may be cheaper, try whichever one is more affordable.  Please try to limit your salt intake.

## 2015-01-12 LAB — BMP8+ANION GAP
ANION GAP: 18 mmol/L (ref 10.0–18.0)
BUN/Creatinine Ratio: 23 (ref 11–26)
BUN: 34 mg/dL — AB (ref 8–27)
CHLORIDE: 103 mmol/L (ref 97–108)
CO2: 18 mmol/L (ref 18–29)
CREATININE: 1.46 mg/dL — AB (ref 0.57–1.00)
Calcium: 9.4 mg/dL (ref 8.7–10.3)
GFR calc Af Amer: 43 mL/min/{1.73_m2} — ABNORMAL LOW (ref >59)
GFR calc non Af Amer: 37 mL/min/{1.73_m2} — ABNORMAL LOW (ref >59)
Glucose: 108 mg/dL — ABNORMAL HIGH (ref 65–99)
Potassium: 4.6 mmol/L (ref 3.5–5.2)
SODIUM: 139 mmol/L (ref 134–144)

## 2015-01-14 MED ORDER — ONETOUCH DELICA LANCETS FINE MISC: Status: DC

## 2015-01-14 MED ORDER — ONETOUCH ULTRA MINI W/DEVICE KIT: PACK | Status: DC

## 2015-01-14 MED ORDER — "INSULIN SYRINGE-NEEDLE U-100 31G X 15/64"" 0.5 ML MISC": Status: DC

## 2015-01-14 MED ORDER — GLUCOSE BLOOD VI STRP: ORAL_STRIP | Status: DC

## 2015-02-08 ENCOUNTER — Ambulatory Visit: Payer: Self-pay | Admitting: Internal Medicine

## 2015-02-16 ENCOUNTER — Ambulatory Visit: Payer: Self-pay | Admitting: Internal Medicine

## 2015-03-20 ENCOUNTER — Encounter: Payer: Self-pay | Admitting: Student

## 2015-04-19 ENCOUNTER — Telehealth: Payer: Self-pay | Admitting: Pharmacist

## 2015-04-19 DIAGNOSIS — Z794 Long term (current) use of insulin: Principal | ICD-10-CM

## 2015-04-19 DIAGNOSIS — E119 Type 2 diabetes mellitus without complications: Secondary | ICD-10-CM

## 2015-04-19 NOTE — Telephone Encounter (Signed)
Sounds good to me Dr. Maudie Mercury. Thank you!

## 2015-04-24 MED ORDER — INSULIN NPH (HUMAN) (ISOPHANE) 100 UNIT/ML ~~LOC~~ SUSP: 30.0000 [IU] | Freq: Every day | SUBCUTANEOUS | Status: DC

## 2015-04-24 NOTE — Telephone Encounter (Signed)
Collaborated with PCP to switch patient from insulin levemir to NPH for affordability.  Insulin NPH reviewed with the patient, including name, instructions, indication, goals of therapy, potential side effects, importance of adherence, and safe use.  Patient verbalized understanding by repeating back information and was advised to contact me if further medication-related questions arise.

## 2015-05-07 ENCOUNTER — Encounter: Payer: Self-pay | Admitting: Internal Medicine

## 2015-05-21 ENCOUNTER — Encounter: Payer: Self-pay | Admitting: Internal Medicine

## 2015-05-21 ENCOUNTER — Ambulatory Visit (INDEPENDENT_AMBULATORY_CARE_PROVIDER_SITE_OTHER): Payer: Medicare Other | Admitting: Internal Medicine

## 2015-05-21 VITALS — BP 183/76 | HR 77 | Temp 98.3°F | Ht 65.0 in | Wt 137.3 lb

## 2015-05-21 DIAGNOSIS — E784 Other hyperlipidemia: Secondary | ICD-10-CM

## 2015-05-21 DIAGNOSIS — I10 Essential (primary) hypertension: Secondary | ICD-10-CM

## 2015-05-21 DIAGNOSIS — Z794 Long term (current) use of insulin: Secondary | ICD-10-CM

## 2015-05-21 DIAGNOSIS — Z720 Tobacco use: Secondary | ICD-10-CM

## 2015-05-21 DIAGNOSIS — E1310 Other specified diabetes mellitus with ketoacidosis without coma: Secondary | ICD-10-CM | POA: Diagnosis not present

## 2015-05-21 DIAGNOSIS — E119 Type 2 diabetes mellitus without complications: Secondary | ICD-10-CM

## 2015-05-21 DIAGNOSIS — E1110 Type 2 diabetes mellitus with ketoacidosis without coma: Secondary | ICD-10-CM

## 2015-05-21 DIAGNOSIS — Z0000 Encounter for general adult medical examination without abnormal findings: Secondary | ICD-10-CM

## 2015-05-21 DIAGNOSIS — Z8673 Personal history of transient ischemic attack (TIA), and cerebral infarction without residual deficits: Secondary | ICD-10-CM

## 2015-05-21 DIAGNOSIS — E1169 Type 2 diabetes mellitus with other specified complication: Secondary | ICD-10-CM

## 2015-05-21 DIAGNOSIS — F17210 Nicotine dependence, cigarettes, uncomplicated: Secondary | ICD-10-CM

## 2015-05-21 DIAGNOSIS — E785 Hyperlipidemia, unspecified: Secondary | ICD-10-CM

## 2015-05-21 DIAGNOSIS — Z7982 Long term (current) use of aspirin: Secondary | ICD-10-CM

## 2015-05-21 LAB — GLUCOSE, CAPILLARY: Glucose-Capillary: 180 mg/dL — ABNORMAL HIGH (ref 65–99)

## 2015-05-21 LAB — POCT GLYCOSYLATED HEMOGLOBIN (HGB A1C): HEMOGLOBIN A1C: 6.8

## 2015-05-21 MED ORDER — SPIRONOLACTONE 25 MG PO TABS: 12.5000 mg | ORAL_TABLET | Freq: Every day | ORAL | Status: DC

## 2015-05-21 MED ORDER — AMLODIPINE BESYLATE 10 MG PO TABS: 10.0000 mg | ORAL_TABLET | Freq: Every day | ORAL | Status: DC

## 2015-05-21 MED ORDER — INSULIN NPH (HUMAN) (ISOPHANE) 100 UNIT/ML ~~LOC~~ SUSP: 40.0000 [IU] | Freq: Every day | SUBCUTANEOUS | Status: DC

## 2015-05-21 NOTE — Assessment & Plan Note (Signed)
She is currently smoking 4-5 cigarettes per day. She is not interested in quitting whatsoever. She has tried the patch and Chantix in the past. The patch did not work for her, and the Chantix gave her funky dreams. I told her if she would like to quit, don't hesitate to give [place] a call.

## 2015-05-21 NOTE — Assessment & Plan Note (Signed)
Her LDL in August 2016 was 120. It's unclear what statin she was on at that time. She is currently on atorvastatin 40 mg daily. She tells me this makes her feel dizzy, and she hates taking statins, but she is compliantbecause she knows it decreases her risk of a heart attack or stroke. I checked a lipid panel today. Her goal LDL is less than 70; we very well may have to up-titrate this at her next visit. If we do, we will need to reemphasize the importance of this medication given her history of stroke and heart attack.

## 2015-05-21 NOTE — Patient Instructions (Addendum)
Mrs. Brandin,  It was a pleasure meeting you today!  We talked about a few things:  1. For your high blood pressure, we still have some room to go. I started you on amlodipine 10 mg daily, and spironolactone 12.5 mg daily. Like we discussed, some people have hyperactive adrenal glands, and spironolactone can work wonders for them. I'm starting to wonder if you're one of those people. We will check your kidney function today; if it is abnormal, I will give you a call. I will see you again in 2 weeks, and we will recheck it again. In the meantime, check it every other day or so so I will know what to change. Your high blood pressure is the #1 factor for another stroke or heart attack. We can get it under control with some work.  2. Your diabetes is very well controlled! Your A1c today was 6.8, and her goal is always less than 7. Well-done, keep up the good work.  3. You're not quite ready to quit smoking and that's okay. If you ever decided you're ready, don't hesitate to give [place] a call. We have other medications we can try besides the Chantix which made you have funky dreams.  4. Please STOP TAKING THE PLAVIX. With your blood pressure as high as it is, I worry about one of your arteries busting under the high pressure and you having a very serious stroke. Just take the aspirin 81 mg daily, and not the Plavix.  5. Nurse Regino Schultze has scheduled you an appointment for your yearly mammogram.  Take care, and we'll see you in 1 month, Dr. Melburn Hake

## 2015-05-21 NOTE — Assessment & Plan Note (Signed)
Her diabetes is well controlled on her current regimen. Her A1c today was 6.8; she is only on NPH 40 units nightly. Her morning sugars have been in the low 100s, so I think this dose is appropriate for her.Her metformin was stopped last fall because her creatinine was rising. She denies any hypoglycemic episodes.

## 2015-05-21 NOTE — Assessment & Plan Note (Signed)
I have checked a hepatitis C antibody, referred her to a mammogram, and performed her foot exam. At the next visit, we can check a DEXA scan. I think this test would be prudent given her thin habitus and history of smoking. She is also due for a retina check given her diabetes.

## 2015-05-21 NOTE — Assessment & Plan Note (Addendum)
She has hypertension resistant to nearly maximal medical therapy and I am starting to wonder if she has primary hyperaldosteronism or renal artery stenosis. I very specifically inquired about all of her antihypertensive medications, and she seems to be taking them as prescribed except for her amlodipine 5 mg, as she was unaware she was supposed to start this medication back in September. She does not have other surreptitious causes of hypertension; she does notdrink alcohol, only smokes 4-5 cigarettes per day, drinks one cup of coffee per day, and does not have symptoms suggestive of obstructive sleep apnea.  Controlling her hypertension is crucial to prevent future strokes or subsequent coronary artery disease. Moreover, here is my plan going forward: today, she will start amlodipine 10 mg daily and spironolactone 12.5 mg daily empirically for primary hyperaldosteronism. I will check a BMP to ensure her creatinine remained stable between now and the next visit.  At the next visit, we can check a BMP to check her renal function and electrolytes. If she remains hypertensive, we can increase the spironolactone to 25 mg daily. We can also consider checking renal Dopplers to look for renal artery stenosis at that time.

## 2015-05-21 NOTE — Progress Notes (Signed)
Patient ID: Stephanie Gross, female   DOB: 1945-08-08, 70 y.o.   MRN: [id number] [facility] Subjective:   Patient ID: Stephanie Gross female   DOB: 06/16/1945 70 y.o.   MRN: [id number]  HPI: [name] is a 70 y.o. female with a history of coronary artery disease status post right coronary artery stent placement in 2007, ischemic stroke,insulin-dependent type 2 diabetes, tobacco use, essential hypertension, and hyperlipidemia, presenting to clinic for follow-up of resistant hypertension.  Resistant hypertension: Her blood pressures at home have been ranging from 170-180. She did not take the amlodipine as prescribed at the last visit in September. Otherwise, she is indeed compliant with all of her other medications, and was able to tell me which one she takes once and twice daily.  Insulin-dependent type 2 diabetes: She has been taking her NPH 40 units nightly as prescribed, and her morning blood sugars have ranged in the low 100s. She denies any hypoglycemic episodes or sores on her feet.  Tobacco abuse: She is smoking 4-5 cigarettes per day. She tells me she doesn't inhale these, she does puffs on them, because it makes her feel comfortable. She is not interested in quitting. She never tried the nicotine patch she was prescribed back in September.  I have personally reviewed her medications with her.  Review of Systems  Constitutional: Negative for fever, chills, weight loss and malaise/fatigue.  Eyes: Negative for blurred vision, double vision and pain.  Respiratory: Negative for cough and shortness of breath.   Cardiovascular: Negative for chest pain, palpitations, orthopnea and leg swelling.  Gastrointestinal: Positive for heartburn. Negative for nausea, vomiting and diarrhea.  Skin: Negative for rash.  Neurological: Negative for dizziness, sensory change, focal weakness, loss of consciousness and headaches.    Objective:  Physical  Exam: Filed Vitals:   05/21/15 1501  BP: 183/76  Pulse: 77  Temp: 98.3 F (36.8 C)  TempSrc: Oral  Height: _0  (1.651 m)  Weight: 137 lb 4.8 oz (62.279 kg)  SpO2: 100%   General: very family black lady sitting in chair joking around with me Cardiac: regular rate and rhythm, no rubs, murmurs or gallops Pulm: breathing well, clear to auscultation bilaterally Abd: bowel sounds normal, soft, nondistended, non-tender Ext: warm and well perfused, without pedal edema. DPs 2+ bilaterally Lymph: no cervical or supraclavicular lymphadenopathy Skin: no rash, hair, or nail changes Neuro: alert and oriented X3, cranial nerves II-XII grossly intact, moving all extremities well  Assessment & Plan:  Case discussed with Dr. Evette Doffing  Resistant hypertension She has hypertension resistant to nearly maximal medical therapy and I am starting to wonder if she has primary hyperaldosteronism or renal artery stenosis. I very specifically inquired about all of her antihypertensive medications, and she seems to be taking them as prescribed except for her amlodipine 5 mg, as she was unaware she was supposed to start this medication back in September. She does not have other surreptitious causes of hypertension; she does notdrink alcohol, only smokes 4-5 cigarettes per day, drinks one cup of coffee per day, and does not have symptoms suggestive of obstructive sleep apnea.  Controlling her hypertension is crucial to prevent future strokes or subsequent coronary artery disease. Moreover, here is my plan going forward: today, she will start amlodipine 10 mg daily and spironolactone 12.5 mg daily empirically for primary hyperaldosteronism. I will check a BMP to ensure her creatinine remained stable between now and the next visit.  At the next visit, we  can check a BMP to check her renal function and electrolytes. If she remains hypertensive, we can increase the spironolactone to 25 mg daily. We can also consider  checking renal Dopplers to look for renal artery stenosis at that time.  DM (diabetes mellitus) type 2, uncontrolled, with ketoacidosis Her diabetes is well controlled on her current regimen. Her A1c today was 6.8; she is only on NPH 40 units nightly. Her morning sugars have been in the low 100s, so I think this dose is appropriate for her.Her metformin was stopped last fall because her creatinine was rising. She denies any hypoglycemic episodes.  Tobacco abuse She is currently smoking 4-5 cigarettes per day. She is not interested in quitting whatsoever. She has tried the patch and Chantix in the past. The patch did not work for her, and the Chantix gave her funky dreams. I told her if she would like to quit, don't hesitate to give [place] a call.   Preventative health care I have checked a hepatitis C antibody, referred her to a mammogram, and performed her foot exam. At the next visit, we can check a DEXA scan. I think this test would be prudent given her thin habitus and history of smoking. She is also due for a retina check given her diabetes.  Hyperlipidemia associated with type 2 diabetes mellitus Her LDL in August 2016 was 120. It's unclear what statin she was on at that time. She is currently on atorvastatin 40 mg daily. She tells me this makes her feel dizzy, and she hates taking statins, but she is compliantbecause she knows it decreases her risk of a heart attack or stroke. I checked a lipid panel today. Her goal LDL is less than 70; we very well may have to up-titrate this at her next visit. If we do, we will need to reemphasize the importance of this medication given her history of stroke and heart attack.  CVA (cerebral vascular accident) She had an ischemic stroke in 2015, and has been on aspirin and clopidogrel since that time. Given her extremely elevated blood pressures, I have advised her to stop taking the clopidogrel. Since her stroke, she denies any residual symptoms; the numbness on  the left side of her face has completely resolved, as well as the headaches. When she was worked up for stroke, her CRP was markedly elevated at 75 with a normal ESR of 22. There was concern she may have giant cell arteritis. Today, she tells me her occipital headaches have completely resolved. Furthermore, she denies jaw claudication, changes in vision, proximal muscle pain and weakness, or changes in her vision. I seriously doubt she has joint cell arteritis, but I don't have a great explanation for that markedly elevated CRP.    Medications Ordered Meds ordered this encounter  Medications  . amLODipine (NORVASC) 10 MG tablet    Sig: Take 1 tablet (10 mg total) by mouth daily.    Dispense:  30 tablet    Refill:  2  . spironolactone (ALDACTONE) 25 MG tablet    Sig: Take 0.5 tablets (12.5 mg total) by mouth daily.    Dispense:  30 tablet    Refill:  3  . insulin NPH Human (HUMULIN N,NOVOLIN N) 100 UNIT/ML injection    Sig: Inject 0.4 mLs (40 Units total) into the skin daily. Over-the-counter    Dispense:  10 mL    Refill:  11   Other Orders Orders Placed This Encounter  Procedures  . MM Digital Screening  Standing Status: Future     Number of Occurrences:      Standing Expiration Date: 07/18/2016    Order Specific Question:  Reason for Exam (SYMPTOM  OR DIAGNOSIS REQUIRED)    Answer:  screening    Order Specific Question:  Preferred imaging location?    Answer:  [facility]  . Glucose, capillary  . BMP8+Anion Gap  . Lipid Profile  . Hepatitis C antibody  . POC Hbg A1C   Follow Up: Return in about 2 weeks (around 06/04/2015) for blood pressure follow up.

## 2015-05-21 NOTE — Assessment & Plan Note (Signed)
She had an ischemic stroke in 2015, and has been on aspirin and clopidogrel since that time. Given her extremely elevated blood pressures, I have advised her to stop taking the clopidogrel. Since her stroke, she denies any residual symptoms; the numbness on the left side of her face has completely resolved, as well as the headaches. When she was worked up for stroke, her CRP was markedly elevated at 75 with a normal ESR of 22. There was concern she may have giant cell arteritis. Today, she tells me her occipital headaches have completely resolved. Furthermore, she denies jaw claudication, changes in vision, proximal muscle pain and weakness, or changes in her vision. I seriously doubt she has joint cell arteritis, but I don't have a great explanation for that markedly elevated CRP.

## 2015-05-22 LAB — LIPID PANEL
CHOL/HDL RATIO: 3.8 ratio (ref 0.0–4.4)
Cholesterol, Total: 207 mg/dL — ABNORMAL HIGH (ref 100–199)
HDL: 55 mg/dL (ref >39)
LDL Calculated: 120 mg/dL — ABNORMAL HIGH (ref 0–99)
TRIGLYCERIDES: 160 mg/dL — AB (ref 0–149)
VLDL CHOLESTEROL CAL: 32 mg/dL (ref 5–40)

## 2015-05-22 LAB — BMP8+ANION GAP
ANION GAP: 17 mmol/L (ref 10.0–18.0)
BUN/Creatinine Ratio: 14 (ref 11–26)
BUN: 24 mg/dL (ref 8–27)
CALCIUM: 9.3 mg/dL (ref 8.7–10.3)
CO2: 22 mmol/L (ref 18–29)
CREATININE: 1.72 mg/dL — AB (ref 0.57–1.00)
Chloride: 106 mmol/L (ref 96–106)
GFR calc Af Amer: 35 mL/min/{1.73_m2} — ABNORMAL LOW (ref >59)
GFR, EST NON AFRICAN AMERICAN: 30 mL/min/{1.73_m2} — AB (ref >59)
Glucose: 209 mg/dL — ABNORMAL HIGH (ref 65–99)
POTASSIUM: 4.4 mmol/L (ref 3.5–5.2)
Sodium: 145 mmol/L — ABNORMAL HIGH (ref 134–144)

## 2015-05-22 LAB — HEPATITIS C ANTIBODY

## 2015-05-23 ENCOUNTER — Telehealth: Payer: Self-pay | Admitting: Internal Medicine

## 2015-05-23 ENCOUNTER — Encounter: Payer: Self-pay | Admitting: Internal Medicine

## 2015-05-23 ENCOUNTER — Ambulatory Visit (INDEPENDENT_AMBULATORY_CARE_PROVIDER_SITE_OTHER): Payer: Medicare Other | Admitting: Internal Medicine

## 2015-05-23 VITALS — BP 166/68 | HR 66 | Temp 98.1°F | Ht 65.0 in | Wt 135.8 lb

## 2015-05-23 DIAGNOSIS — R55 Syncope and collapse: Secondary | ICD-10-CM | POA: Diagnosis not present

## 2015-05-23 DIAGNOSIS — I10 Essential (primary) hypertension: Secondary | ICD-10-CM

## 2015-05-23 NOTE — Telephone Encounter (Signed)
Pt states spironolactone and amlodipine caused her to black out. Please call pt back.

## 2015-05-23 NOTE — Assessment & Plan Note (Signed)
A: Syncope, likely do to orthostatic hypotension from BP meds  P:  She is not currently orthostatic, her history is strongly suggestive as a transient loss in BP as the cause. - I will have her split AM and PM doses of her antihypertensives and keep a blood pressure log with twice daily readings.  She will follow up in 1 week.  Will hold amlodipine for now.

## 2015-05-23 NOTE — Telephone Encounter (Signed)
Pt returned phone call. Please call pt back.

## 2015-05-23 NOTE — Addendum Note (Signed)
Addended by: Lalla Brothers T on: 05/23/2015 02:37 PM   Modules accepted: Level of Service, SmartSet

## 2015-05-23 NOTE — Assessment & Plan Note (Signed)
A: Resistant HTN  P: She may very well have some sort of hyperaldosteronism and the addtion of spironolactone and amlodipine made her transiently drop.  I am having her break her hypertensive to am and pm dosing, will also hold off amlodipine for now -Current meds: Lisinopril 40mg  and Spironolactone 12.5mg  in PM Chlorthalidone 50mg  and IMDUR 60mg  QAM Coreg 25mg  BID

## 2015-05-23 NOTE — Patient Instructions (Signed)
General Instructions:  I want you to stop taking amlodipine for now, do not throw this medication away.  I want you to start taking Chlorthalidone and IMDUR in the MORNING  I want you to take Spironolactone (1/2 a pill) and Lisinopril in the AFTERNOON  Continue to take COREG twice a day.  Record your blood pressure TWICE a day and bring this to your next visit.  Please bring your medicines with you each time you come to clinic.  Medicines may include prescription medications, over-the-counter medications, herbal remedies, eye drops, vitamins, or other pills.   Progress Toward Treatment Goals:  Treatment Goal 08/30/2013  Hemoglobin A1C -  Blood pressure at goal  Stop smoking smoking the same amount    Self Care Goals & Plans:  Self Care Goal 05/21/2015  Manage my medications take my medicines as prescribed; bring my medications to every visit; refill my medications on time  Monitor my health keep track of my blood glucose; bring my glucose meter and log to each visit  Eat healthy foods drink diet soda or water instead of juice or soda; eat more vegetables; eat foods that are low in salt; eat baked foods instead of fried foods; eat fruit for snacks and desserts  Be physically active find an activity I enjoy  Stop smoking cut down the number of cigarettes smoked  Meeting treatment goals maintain the current self-care plan    Home Blood Glucose Monitoring 11/08/2014  Check my blood sugar 3 times a day  When to check my blood sugar after breakfast; after lunch; after dinner     Care Management & Community Referrals:  Referral 11/08/2013  Referrals made for care management support -  Referrals made to community resources none

## 2015-05-23 NOTE — Telephone Encounter (Signed)
Pt saw dr Heber [place] today

## 2015-05-23 NOTE — Progress Notes (Signed)
Internal Medicine Clinic Attending  Case discussed with Dr. Flores at the time of the visit.  We reviewed the resident's history and exam and pertinent patient test results.  I agree with the assessment, diagnosis, and plan of care documented in the resident's note. 

## 2015-05-23 NOTE — Progress Notes (Signed)
[facility] Subjective:   Patient ID: Stephanie Gross female   DOB: 11/29/1945 70 y.o.   MRN: [id number]  HPI: Stephanie Gross is a 70 y.o. female with a PMH detailed below who presents after a syncopal episode yesterday. She was seen in clinic 2 days ago by her PCP.  Her blood pressure was significantly elevated despite compliance with multiple antihypertensives.  It was suspected that she may have resistant hypertesnion (possibly Conn's or RAS) and amlodipine and low dose spironolactone was added to her regimen.  She reports that last night at 6pm she took all of her antihypertensives including amlodipine and 12.'5mg'$  of spironolactone at 6pm then went to watch TV.  She then got up at 7pm and went to the bathroom.  She was standing at the sink with the water running and then the next thing she remembers is that she was in the bathtub being worken up by her husband who heard her fall.  She denies any pain after the fall and did not want him to call EMS.  She reports that she slowly felt better after about 10-15 minutes and decided she would just come in today for evaluation.  She was not washing her face at the time.  She denies any weakness.  There was no limb jerking or incontinence.  She roughly feels back to normal except a little weak in her legs on standing.     Past Medical History  Diagnosis Date  . Hypertension   . Diabetes mellitus without complication ([place])   . CAD (coronary artery disease)     s/p RCA stent 2007. Myoview 2010 normal  . DM (diabetes mellitus) ([place])   . HTN (hypertension)   . Hyperlipidemia   . GERD (gastroesophageal reflux disease)   . 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 Arthritis   . Obesity   . AVD (aortic valve disease)   . Vitamin D deficiency   . Chronic obstructive bronchitis without exacerbation ([place])   . Derangement of left knee   . Degeneration of meniscus of  knee    Current Outpatient Prescriptions  Medication Sig Dispense Refill  . amLODipine (NORVASC) 10 MG tablet Take 1 tablet (10 mg total) by mouth daily. 30 tablet 2  . aspirin EC 81 MG tablet Take 81 mg by mouth daily.    Marland Kitchen atorvastatin (LIPITOR) 40 MG tablet Take 1 tablet (40 mg total) by mouth every other day. 90 tablet 0  . Blood Glucose Monitoring Suppl (ONE TOUCH ULTRA MINI) W/DEVICE KIT Check blood sugar 3 times a day 100 each 5  . carvedilol (COREG) 25 MG tablet Take 1 tablet (25 mg total) by mouth 2 (two) times daily with a meal. 180 tablet 3  . chlorthalidone (HYGROTON) 50 MG tablet Take 1 tablet (50 mg total) by mouth daily. 90 tablet 3  . glucose blood (ONE TOUCH ULTRA TEST) test strip Check blood sugar before and after meals and bedtime 250.00 insulin requiring 100 each 5  . insulin NPH Human (HUMULIN N,NOVOLIN N) 100 UNIT/ML injection Inject 0.4 mLs (40 Units total) into the skin daily. Over-the-counter 10 mL 11  . Insulin Syringe-Needle U-100 31G X 15/64" 0.5 ML MISC Use to inject insulin once time a day 100 each 3  . isosorbide mononitrate (IMDUR) 60 MG 24 hr tablet Take 1 tablet (60 mg total) by mouth daily. 180 tablet 3  . lansoprazole (PREVACID) 30 MG  capsule Take 1 capsule (30 mg total) by mouth daily at 12 noon. (Patient taking differently: Take 30 mg by mouth daily as needed (for acid reflux, heartburn). ) 30 capsule 1  . lisinopril (PRINIVIL,ZESTRIL) 40 MG tablet Take 1 tablet (40 mg total) by mouth daily. 90 tablet 3  . ONETOUCH DELICA LANCETS FINE MISC check blood sugar 3 times a day 100 each 5  . spironolactone (ALDACTONE) 25 MG tablet Take 0.5 tablets (12.5 mg total) by mouth daily. 30 tablet 3   No current facility-administered medications for this visit.   Family History  Problem Relation Age of Onset  . Stroke Mother   . Diabetes Mother   . Diabetes Maternal Grandmother   . Diabetes Sister   . Hypertension Mother    Social History   Social History  .  Marital Status: Single    Spouse Name: N/A  . Number of Children: N/A  . Years of Education: N/A   Occupational History  . unemployed    Social History Main Topics  . Smoking status: Current Every Day Smoker -- 0.30 packs/day for 30 years    Types: Cigarettes  . Smokeless tobacco: Never Used     Comment: cutting back / 1/2 PPD. stopped 05/28/2014  . Alcohol Use: No  . Drug Use: No  . Sexual Activity: Not on file   Other Topics Concern  . Not on file   Social History Narrative   ** Merged History Encounter **       Patient recently lost her job and is currently without insurance. - Nov 2012   Review of Systems: Review of Systems  Constitutional: Negative for fever and chills.  Eyes: Negative for blurred vision.  Cardiovascular: Negative for chest pain.  Gastrointestinal: Negative for blood in stool and melena.  Genitourinary: Negative for dysuria.  Musculoskeletal: Positive for falls. Negative for back pain and neck pain.  Neurological: Positive for loss of consciousness. Negative for tingling, sensory change, speech change, focal weakness, seizures and headaches.  Endo/Heme/Allergies: Does not bruise/bleed easily.     Objective:  Physical Exam: Filed Vitals:   05/23/15 1605  BP: 166/68  Pulse: 66  Temp: 98.1 F (36.7 C)  TempSrc: Oral  Height: '5\' 5"'$  (1.651 m)  Weight: 135 lb 12.8 oz (61.598 kg)  SpO2: 100%   Physical Exam  Constitutional: She is oriented to person, place, and time and well-developed, well-nourished, and in no distress.  Cardiovascular: Normal rate and regular rhythm.   Pulmonary/Chest: Effort normal and breath sounds normal.  Musculoskeletal: She exhibits no edema or tenderness (c-spine).  Neurological: She is alert and oriented to person, place, and time.  Nursing note and vitals reviewed.    Assessment & Plan:  Case discussed with Dr. Evette Doffing  Syncope A: Syncope, likely do to orthostatic hypotension from BP meds  P:  She is not  currently orthostatic, her history is strongly suggestive as a transient loss in BP as the cause. - I will have her split AM and PM doses of her antihypertensives and keep a blood pressure log with twice daily readings.  She will follow up in 1 week.  Will hold amlodipine for now.  Resistant hypertension A: Resistant HTN  P: She may very well have some sort of hyperaldosteronism and the addtion of spironolactone and amlodipine made her transiently drop.  I am having her break her hypertensive to am and pm dosing, will also hold off amlodipine for now -Current meds: Lisinopril '40mg'$  and Spironolactone 12.'5mg'$   in PM Chlorthalidone '50mg'$  and IMDUR '60mg'$  QAM Coreg '25mg'$  BID    Medications Ordered No orders of the defined types were placed in this encounter.   Other Orders No orders of the defined types were placed in this encounter.   Follow Up: Return in about 1 week (around 05/30/2015).

## 2015-05-24 NOTE — Progress Notes (Signed)
Internal Medicine Clinic Attending  Case discussed with Dr. Hoffman at the time of the visit.  We reviewed the resident's history and exam and pertinent patient test results.  I agree with the assessment, diagnosis, and plan of care documented in the resident's note.  

## 2015-05-31 ENCOUNTER — Other Ambulatory Visit: Payer: Self-pay | Admitting: Internal Medicine

## 2015-05-31 ENCOUNTER — Ambulatory Visit
Admission: RE | Admit: 2015-05-31 | Discharge: 2015-05-31 | Disposition: A | Discharge status: Home / Self Care | Payer: Medicare Other | Source: Ambulatory Visit | Attending: Internal Medicine | Admitting: Internal Medicine

## 2015-05-31 DIAGNOSIS — Z0000 Encounter for general adult medical examination without abnormal findings: Secondary | ICD-10-CM

## 2015-05-31 DIAGNOSIS — Z1231 Encounter for screening mammogram for malignant neoplasm of breast: Secondary | ICD-10-CM

## 2015-06-01 ENCOUNTER — Telehealth: Payer: Self-pay | Admitting: Internal Medicine

## 2015-06-01 NOTE — Telephone Encounter (Signed)
appt reminder call/lmtcb if she needs too cancel

## 2015-06-04 ENCOUNTER — Encounter: Payer: Self-pay | Admitting: Internal Medicine

## 2015-06-04 ENCOUNTER — Ambulatory Visit (INDEPENDENT_AMBULATORY_CARE_PROVIDER_SITE_OTHER): Payer: Medicare Other | Admitting: Internal Medicine

## 2015-06-04 VITALS — BP 179/76 | HR 74 | Temp 98.1°F | Ht 65.0 in | Wt 134.1 lb

## 2015-06-04 DIAGNOSIS — E784 Other hyperlipidemia: Secondary | ICD-10-CM

## 2015-06-04 DIAGNOSIS — E785 Hyperlipidemia, unspecified: Secondary | ICD-10-CM

## 2015-06-04 DIAGNOSIS — F17200 Nicotine dependence, unspecified, uncomplicated: Secondary | ICD-10-CM | POA: Diagnosis not present

## 2015-06-04 DIAGNOSIS — Z0000 Encounter for general adult medical examination without abnormal findings: Secondary | ICD-10-CM

## 2015-06-04 DIAGNOSIS — E1169 Type 2 diabetes mellitus with other specified complication: Secondary | ICD-10-CM

## 2015-06-04 DIAGNOSIS — I10 Essential (primary) hypertension: Secondary | ICD-10-CM | POA: Diagnosis not present

## 2015-06-04 MED ORDER — AMLODIPINE BESYLATE 10 MG PO TABS: 5.0000 mg | ORAL_TABLET | Freq: Every day | ORAL | Status: DC

## 2015-06-04 MED ORDER — ATORVASTATIN CALCIUM 80 MG PO TABS: 80.0000 mg | ORAL_TABLET | ORAL | Status: DC

## 2015-06-04 NOTE — Assessment & Plan Note (Addendum)
She remains hypertensive to 180/80 today on carvedilol 25mg  twice daily, chlorthalidone 50mg  daily, Imdur 60mg  daily, lisinopril 40mg  daily, and spironolactone 12.5mg  daily. She seems very compliant with the regiment and can rattle off her entire pill schedule. She remains hypertensive so I'll resume her amlodipine 5mg  daily. We'll check a BMP today to check her creatinine; if it's stable, I think we can further increase her spironolactone to cover empirically for hyperaldosteronism. She does not have a goiter nor a thyroid nodule, and does not have other symptoms of hyperthyroidism, but I've checked a TSH to be thorough.

## 2015-06-04 NOTE — Patient Instructions (Signed)
Stephanie Gross,  It was great to see you again today.  For your high blood pressure, we've started amlodipine 5mg  daily. Check your blood pressures every morning and evening.  I've also checked your thyroid to see if this could be contributing to your hypertension.  I'm checking your kidney function as well to find you the best high blood pressure medication.  We'll check a bone scan to see if you have osteoporosis as well.  Next visit, we can do a retina scan.  Take care, and I'll see you in 1 month, Dr. Melburn Hake

## 2015-06-04 NOTE — Assessment & Plan Note (Signed)
Today I've referred her for a DEXA scan as she is a thin female smoker. If positive, I think we can start alendronate. At the next visit, we'll need to do a retinal scan.

## 2015-06-04 NOTE — Progress Notes (Addendum)
Patient ID: Stephanie Gross, female   DOB: [birth year], 70 y.o.   MRN: [id number] [facility] Subjective:   Patient ID: Stephanie Gross female   DOB: 08-19-1945 70 y.o.   MRN: [id number]  HPI: Stephanie Gross is a 70 y.o. female with a history of resistant hypertension, stroke, and hyperlipidemia presenting for follow-up of hypertension and hyperlipidemia.  Hypertension: Her blood pressures continue to remain elevated in the 170s-180s at home. She insists she is taking all of her medications as prescribed and can actually list them off to me. She had a syncopal episode after starting the spironolactone but has not had any since then. She denies any lightheadedness upon standing.  I have reviewed her medications and she is currently a 0.5pack per day smoker  Review of Systems  Constitutional: Negative for fever, chills and malaise/fatigue.  HENT: Negative for hearing loss.   Respiratory: Negative for shortness of breath and wheezing.   Cardiovascular: Negative for chest pain, palpitations and leg swelling.  Neurological: Positive for dizziness. Negative for focal weakness and headaches.   Objective:  Physical Exam: Filed Vitals:   06/04/15 1536  BP: 179/76  Pulse: 74  Temp: 98.1 F (36.7 C)  TempSrc: Oral  Height: 5\' 5"  (1.651 m)  Weight: 134 lb 1.6 oz (60.827 kg)  SpO2: 100%   General: very friendly lady resting in chair comfortably, appropriately conversational HEENT: no goiter or thyroid nodules Cardiac: regular rate and rhythm, no rubs, murmurs or gallops Pulm: breathing well, clear to auscultation bilaterally Abd: bowel sounds normal, soft, nondistended, non-tender Ext: warm and well perfused, without pedal edema  Assessment & Plan:  Case discussed with Dr. Evette Doffing  Resistant hypertension She remains hypertensive to 180/80 today on carvedilol 25mg  twice daily, chlorthalidone 50mg  daily, Imdur 60mg  daily, lisinopril 40mg  daily,  and spironolactone 12.5mg  daily. She seems very compliant with the regiment and can rattle off her entire pill schedule. She remains hypertensive so I'll resume her amlodipine 5mg  daily. We'll check a BMP today to check her creatinine; if it's stable, I think we can further increase her spironolactone to cover empirically for hyperaldosteronism. She does not have a goiter nor a thyroid nodule, and does not have other symptoms of hyperthyroidism, but I've checked a TSH to be thorough.  Hyperlipidemia associated with type 2 diabetes mellitus Her LDL last visit was 120 so I've increased her atorvastatin from 40 to 80mg  today.  Preventative health care Today I've referred her for a DEXA scan as she is a thin female smoker. If positive, I think we can start alendronate. At the next visit, we'll need to do a retinal scan.   Medications Ordered Meds ordered this encounter  Medications  . atorvastatin (LIPITOR) 80 MG tablet    Sig: Take 1 tablet (80 mg total) by mouth every other day.    Dispense:  90 tablet    Refill:  4  . amLODipine (NORVASC) 10 MG tablet    Sig: Take 0.5 tablets (5 mg total) by mouth daily.    Dispense:  30 tablet    Refill:  2   Other Orders Orders Placed This Encounter  Procedures  . DG Bone Density    Standing Status: Future     Number of Occurrences:      Standing Expiration Date: 08/01/2016    Order Specific Question:  Reason for Exam (SYMPTOM  OR DIAGNOSIS REQUIRED)    Answer:  screening given think 70 yo female smoker  Order Specific Question:  Preferred imaging location?    Answer:  [facility]  . TSH  . BMP8+Anion Gap  . T4, Free   Follow Up: Return in about 4 weeks (around 07/02/2015).

## 2015-06-04 NOTE — Assessment & Plan Note (Signed)
Her LDL last visit was 120 so I've increased her atorvastatin from 40 to 80mg  today.

## 2015-06-05 LAB — BMP8+ANION GAP
ANION GAP: 17 mmol/L (ref 10.0–18.0)
BUN/Creatinine Ratio: 21 (ref 11–26)
BUN: 41 mg/dL — AB (ref 8–27)
CALCIUM: 9.6 mg/dL (ref 8.7–10.3)
CO2: 19 mmol/L (ref 18–29)
CREATININE: 1.94 mg/dL — AB (ref 0.57–1.00)
Chloride: 104 mmol/L (ref 96–106)
GFR calc Af Amer: 30 mL/min/{1.73_m2} — ABNORMAL LOW (ref >59)
GFR, EST NON AFRICAN AMERICAN: 26 mL/min/{1.73_m2} — AB (ref >59)
Glucose: 121 mg/dL — ABNORMAL HIGH (ref 65–99)
POTASSIUM: 4.8 mmol/L (ref 3.5–5.2)
Sodium: 140 mmol/L (ref 134–144)

## 2015-06-05 LAB — T4, FREE: Free T4: 1.04 ng/dL (ref 0.82–1.77)

## 2015-06-05 LAB — TSH: TSH: 0.666 u[IU]/mL (ref 0.450–4.500)

## 2015-06-06 NOTE — Progress Notes (Signed)
Internal Medicine Clinic Attending  Case discussed with Dr. Flores at the time of the visit.  We reviewed the resident's history and exam and pertinent patient test results.  I agree with the assessment, diagnosis, and plan of care documented in the resident's note. 

## 2015-06-13 ENCOUNTER — Telehealth: Payer: Self-pay | Admitting: Internal Medicine

## 2015-06-13 NOTE — Telephone Encounter (Signed)
WOULD LIKE NURSE TO CALL HER AND GIVE MESSAGE TO DR. FLORES

## 2015-06-13 NOTE — Telephone Encounter (Signed)
Spoke with patient.  She is feeling generally unwell, she reports feeling nauseated and cannot eat, although she reports feeling hungry.  She reports her lips, tongue, and mouth feel numb.  Denies swelling.  Pt relates all of her issues to medications and does not like this feeling.  Pt admits to only taking her prescribed insulin, aspirin, carvedilol, and chlorthalidone.  She says she has never taken her atorvastatin as prescribed and does know why it is prescribed.  She reports randomly taking lisinopril and has not started norvasc.    Advice? She was disappointed that it was me calling and not you, she originally declined to talk to me stating she would see you in March and let you know what was going on.

## 2015-06-18 ENCOUNTER — Telehealth: Payer: Self-pay | Admitting: Internal Medicine

## 2015-06-18 NOTE — Telephone Encounter (Signed)
Pt states she cannot eat, she states she can only eat a few peanuts some fresh orange slices and some diet soda, appt was moved up w/ dr Melburn Hake til 3/1 and spoke w/ donnap., advised trying diab carnation instant breakfast mixed w/ some sugar free orange sherbet or sugar free ice cream, a banana, a little spinach, avocado and sipping on small amts of this at a time as to not overwhelm her desire to eat. She states she is having good blood sugars 120's. She is ask to call if she becomes worse and try to make sure she keeps liquids in by sipping constantly. She states this is not a viral type nausea she just does not want to eat because things taste "nasty" but she states she is starving and really wants to eat but cant. She is agreeable

## 2015-06-18 NOTE — Telephone Encounter (Signed)
HUSBAND CALLED AND HE IS CONCERNED SHE IS NOT EATING FOR 3 WEEKS, NOTHING TASTE GOOD TO HER. HE WOULD LIKE CALL BACK [phone] Charline Bills

## 2015-06-27 ENCOUNTER — Encounter: Payer: Self-pay | Admitting: Internal Medicine

## 2015-06-27 ENCOUNTER — Ambulatory Visit (INDEPENDENT_AMBULATORY_CARE_PROVIDER_SITE_OTHER): Payer: Medicare Other | Admitting: Internal Medicine

## 2015-06-27 VITALS — BP 164/66 | HR 79 | Temp 98.7°F | Ht 65.0 in | Wt 132.1 lb

## 2015-06-27 DIAGNOSIS — R432 Parageusia: Secondary | ICD-10-CM | POA: Diagnosis not present

## 2015-06-27 DIAGNOSIS — F17210 Nicotine dependence, cigarettes, uncomplicated: Secondary | ICD-10-CM

## 2015-06-27 DIAGNOSIS — N185 Chronic kidney disease, stage 5: Secondary | ICD-10-CM | POA: Insufficient documentation

## 2015-06-27 DIAGNOSIS — N183 Chronic kidney disease, stage 3 unspecified: Secondary | ICD-10-CM

## 2015-06-27 DIAGNOSIS — R634 Abnormal weight loss: Secondary | ICD-10-CM | POA: Diagnosis not present

## 2015-06-27 DIAGNOSIS — I1A0 Resistant hypertension: Secondary | ICD-10-CM

## 2015-06-27 DIAGNOSIS — M810 Age-related osteoporosis without current pathological fracture: Secondary | ICD-10-CM

## 2015-06-27 DIAGNOSIS — I10 Essential (primary) hypertension: Secondary | ICD-10-CM

## 2015-06-27 LAB — GLUCOSE, CAPILLARY: Glucose-Capillary: 125 mg/dL — ABNORMAL HIGH (ref 65–99)

## 2015-06-27 MED ORDER — HYDRALAZINE HCL 25 MG PO TABS: 25.0000 mg | ORAL_TABLET | Freq: Four times a day (QID) | ORAL | Status: DC

## 2015-06-27 NOTE — Patient Instructions (Addendum)
Stephanie Gross,  It was great to see you again today.  I'm sorry you haven't been able to eat and things taste so funny.  Here's our plan going forward:  1. We'll stop the spironolactone because this can cause you to taste things differently.  2. We'll start a new high blood pressure medication called hydralazine.  3. We'll check your electrolyte levels because sometimes these can cause chin numbness.  I'd like to see you back early next week. If you're still having symptoms, we may want to consider getting another brain MRI.  We'll get this figured out.  Take care, and I'll see you next week, Dr. Melburn Hake

## 2015-06-27 NOTE — Assessment & Plan Note (Signed)
Assessment: She has lost 30 pounds in the last 2.5 years but has not been dieting. Her mammogram and colonoscopy are up-to-date. She has a 20 pack-year smoking history but had a normal chest x-ray in 2015. We may want to consider a low-dose chest CT to screen for lung cancer. I'll mention this to her at the next visit.  Plan: -Will mention low-dose chest CT to screen for lung cancer at the next visit

## 2015-06-27 NOTE — Assessment & Plan Note (Signed)
Assessment: Her creatinine continues to creep up which I think is most likely from hypertensive nephropathy. Her creatinine clearance is now 30 and she has proteinuria. We've stopped spironolactone today which may help her renal function. At the next visit, we may want to consider stopping HCTZ as its effect diminishes with CrCl of less than 30, and switching this to furosemide 20mg  twice daily. Additionally, amlodipine can worsen proteinuria so we may want to switch this to diltiazem.  Plan: -Checking CMP today -Stopped spironolacone -Started hydralazine -At next visit, we may want to consider switching HCTZ to furosemide 20mg  twice daily -At next visit, we may want to consider switching amlodipine to diltiazem

## 2015-06-27 NOTE — Assessment & Plan Note (Signed)
Assessment: Her dysgeusia and perioral numbness is strange to me and I don't have a good answer to the cause. It seems to be most temporally related to starting the spironolactone, which can cause dysgeusia but not the perioral numbness. We will start the workup by stopping the spironolactone, and starting hydralazine in lieu of this. We'll also check her calcium and albumin levels today but I wouldn't expect hypocalcemia to cause dysgeusia. Although I don't think this is a stroke given the distribution, if she continues to have the dysgeusia and perioral numbness, I like to get a brain MRI at the next visit to rule out another stroke, something odd like leptomeningeal carcinomatosis, or less likely multiple sclerosis.   Plan: -Stopped spironolactone today -Started hydralazine 25 mg 4 times daily -If her per systems persist at the next visit, will get brain MRI

## 2015-06-27 NOTE — Progress Notes (Signed)
Patient ID: Stephanie Gross, female   DOB: 1945/10/28, 70 y.o.   MRN: [id number] [facility] Subjective:   Patient ID: Stephanie Gross female   DOB: Mar 30, 1946 70 y.o.   MRN: [id number]  HPI: [name] is a 70 y.o. female with a history of resistant hypertension, type 2 diabetes, coronary artery disease status post RCA stent 2007, hyperlipidemia, presenting with altered taste and facial numbness.  Dysgeusia and chin numbness: Three weeks ago, she was unable to taste foods and her mouth and tongue have been numb; this is a strange sensation she has never felt before but she is unable to eat because things taste so strange. Her appetite is great. This started about 1 week after she started taking spironolactone. She denies any unilateral numbness or weakness in her face or extremities, changes in her vision or eye pain, history of multiple sclerosis in the family, head trauma, recent allergic rhinitis, tooth pain, or recent stressors.  I've reviewed her medications and updated them.  She is currently smoking 0.5 packs of cigarettes per day and is not interested in quitting.  Review of Systems  Constitutional: Positive for weight loss. Negative for fever and chills.  HENT: Negative for congestion, ear pain, hearing loss and sore throat.   Respiratory: Negative for shortness of breath.   Cardiovascular: Negative for chest pain, orthopnea and leg swelling.  Neurological: Positive for sensory change. Negative for dizziness, tingling, speech change, focal weakness, loss of consciousness and headaches.  Psychiatric/Behavioral: Negative for depression. The patient does not have insomnia.      Objective:  Physical Exam: Filed Vitals:   06/27/15 1531 06/27/15 1536  BP: 175/69 164/66  Pulse: 79 79  Temp: 98.7 F (37.1 C)   TempSrc: Oral   Height: 5\' 5"  (1.651 m)   Weight: 132 lb 1.6 oz (59.92 kg)   SpO2: 100%    General: thin friendly lady  resting in chair comfortably, appropriately conversational HEENT: no scleral icterus, extra-ocular muscles intact, oropharynx without lesions, nasal turbinates edematous Cardiac: regular rate and rhythm, no rubs, murmurs or gallops Pulm: breathing well, clear to auscultation bilaterally Ext: warm and well perfused, without pedal edema Lymph: no cervical or supraclavicular lymphadenopathy Skin: no rash, hair, or nail changes Neuro: alert and oriented X3, cranial nerves II-XII intact, strength 5/5 throughout, sensation intact to light touch throughout  Assessment & Plan:  Case discussed with Dr. Lynnae January  Dysgeusia Assessment: Her dysgeusia and perioral numbness is strange to me and I don't have a good answer to the cause. It seems to be most temporally related to starting the spironolactone, which can cause dysgeusia but not the perioral numbness. We will start the workup by stopping the spironolactone, and starting hydralazine in lieu of this. We'll also check her calcium and albumin levels today but I wouldn't expect hypocalcemia to cause dysgeusia. Although I don't think this is a stroke given the distribution, if she continues to have the dysgeusia and perioral numbness, I like to get a brain MRI at the next visit to rule out another stroke, something odd like leptomeningeal carcinomatosis, or less likely multiple sclerosis.   Plan: -Stopped spironolactone today -Started hydralazine 25 mg 4 times daily -If her per systems persist at the next visit, will get brain MRI  CKD (chronic kidney disease) stage 3, GFR 30-59 ml/min Assessment: Her creatinine continues to creep up which I think is most likely from hypertensive nephropathy. Her creatinine clearance is now 30 and she has  proteinuria. We've stopped spironolactone today which may help her renal function. At the next visit, we may want to consider stopping HCTZ as its effect diminishes with CrCl of less than 30, and switching this to  furosemide 20mg  twice daily. Additionally, amlodipine can worsen proteinuria so we may want to switch this to diltiazem.  Plan: -Checking CMP today -Stopped spironolacone -Started hydralazine -At next visit, we may want to consider switching HCTZ to furosemide 20mg  twice daily -At next visit, we may want to consider switching amlodipine to diltiazem  Unintentional weight loss Assessment: She has lost 30 pounds in the last 2.5 years but has not been dieting. Her mammogram and colonoscopy are up-to-date. She has a 20 pack-year smoking history but had a normal chest x-ray in 2015. We may want to consider a low-dose chest CT to screen for lung cancer. I'll mention this to her at the next visit.  Plan: -Will mention low-dose chest CT to screen for lung cancer at the next visit   Medications Ordered Meds ordered this encounter  Medications  . hydrALAZINE (APRESOLINE) 25 MG tablet    Sig: Take 1 tablet (25 mg total) by mouth 4 (four) times daily.    Dispense:  120 tablet    Refill:  11   Other Orders Orders Placed This Encounter  Procedures  . Glucose, capillary   Follow Up: Return in about 5 days (around 07/02/2015).

## 2015-06-28 LAB — CMP14 + ANION GAP
ALBUMIN: 3.5 g/dL — AB (ref 3.6–4.8)
ALT: 8 IU/L (ref 0–32)
ANION GAP: 16 mmol/L (ref 10.0–18.0)
AST: 10 IU/L (ref 0–40)
Albumin/Globulin Ratio: 1.3 (ref 1.1–2.5)
Alkaline Phosphatase: 124 IU/L — ABNORMAL HIGH (ref 39–117)
BUN / CREAT RATIO: 19 (ref 11–26)
BUN: 30 mg/dL — ABNORMAL HIGH (ref 8–27)
Bilirubin Total: <0.2 mg/dL (ref 0.0–1.2)
CHLORIDE: 105 mmol/L (ref 96–106)
CO2: 19 mmol/L (ref 18–29)
CREATININE: 1.61 mg/dL — AB (ref 0.57–1.00)
Calcium: 9.3 mg/dL (ref 8.7–10.3)
GFR calc Af Amer: 38 mL/min/{1.73_m2} — ABNORMAL LOW (ref >59)
GFR, EST NON AFRICAN AMERICAN: 33 mL/min/{1.73_m2} — AB (ref >59)
GLUCOSE: 110 mg/dL — AB (ref 65–99)
Globulin, Total: 2.6 g/dL (ref 1.5–4.5)
Potassium: 4.2 mmol/L (ref 3.5–5.2)
Sodium: 140 mmol/L (ref 134–144)
TOTAL PROTEIN: 6.1 g/dL (ref 6.0–8.5)

## 2015-06-28 NOTE — Addendum Note (Signed)
Addended by: Carlota Raspberry on: 06/28/2015 08:43 AM   Modules accepted: Orders

## 2015-06-29 NOTE — Progress Notes (Signed)
Internal Medicine Clinic Attending  Case discussed with Dr. Flores at the time of the visit.  We reviewed the resident's history and exam and pertinent patient test results.  I agree with the assessment, diagnosis, and plan of care documented in the resident's note. 

## 2015-07-04 ENCOUNTER — Encounter: Payer: Self-pay | Admitting: Internal Medicine

## 2015-07-04 ENCOUNTER — Ambulatory Visit (INDEPENDENT_AMBULATORY_CARE_PROVIDER_SITE_OTHER): Payer: Medicare Other | Admitting: Internal Medicine

## 2015-07-04 ENCOUNTER — Ambulatory Visit: Payer: Medicare Other | Admitting: Pharmacist

## 2015-07-04 VITALS — BP 166/58 | HR 71 | Temp 98.2°F | Resp 18 | Ht 65.0 in | Wt 133.3 lb

## 2015-07-04 DIAGNOSIS — I25811 Atherosclerosis of native coronary artery of transplanted heart without angina pectoris: Secondary | ICD-10-CM

## 2015-07-04 DIAGNOSIS — E119 Type 2 diabetes mellitus without complications: Secondary | ICD-10-CM

## 2015-07-04 DIAGNOSIS — Z794 Long term (current) use of insulin: Secondary | ICD-10-CM

## 2015-07-04 DIAGNOSIS — I10 Essential (primary) hypertension: Secondary | ICD-10-CM

## 2015-07-04 DIAGNOSIS — R432 Parageusia: Secondary | ICD-10-CM

## 2015-07-04 DIAGNOSIS — I25119 Atherosclerotic heart disease of native coronary artery with unspecified angina pectoris: Secondary | ICD-10-CM

## 2015-07-04 LAB — GLUCOSE, CAPILLARY: Glucose-Capillary: 118 mg/dL — ABNORMAL HIGH (ref 65–99)

## 2015-07-04 MED ORDER — CHLORTHALIDONE 50 MG PO TABS: 50.0000 mg | ORAL_TABLET | Freq: Every day | ORAL | Status: DC

## 2015-07-04 MED ORDER — AMLODIPINE BESYLATE 5 MG PO TABS: 5.0000 mg | ORAL_TABLET | Freq: Every day | ORAL | Status: DC

## 2015-07-04 MED ORDER — ATORVASTATIN CALCIUM 80 MG PO TABS: 80.0000 mg | ORAL_TABLET | Freq: Every day | ORAL | Status: DC

## 2015-07-04 MED ORDER — PEN NEEDLES 31G X 5 MM MISC: 1.0000 | Freq: Every day | Status: DC

## 2015-07-04 MED ORDER — CARVEDILOL 25 MG PO TABS: 25.0000 mg | ORAL_TABLET | Freq: Two times a day (BID) | ORAL | Status: DC

## 2015-07-04 MED ORDER — ISOSORBIDE MONONITRATE ER 60 MG PO TB24: 60.0000 mg | ORAL_TABLET | Freq: Every day | ORAL | Status: DC

## 2015-07-04 MED ORDER — INSULIN GLARGINE 100 UNIT/ML SOLOSTAR PEN: 40.0000 [IU] | PEN_INJECTOR | Freq: Every day | SUBCUTANEOUS | Status: DC

## 2015-07-04 MED ORDER — LISINOPRIL 40 MG PO TABS: 40.0000 mg | ORAL_TABLET | Freq: Every day | ORAL | Status: DC

## 2015-07-04 MED FILL — ISOSORBIDE MN ER 60 MG TAB: 60 | 30 days supply | Qty: 30 | Fill #0

## 2015-07-04 MED FILL — LISINOPRIL 40 MG TABLET: 40 | 30 days supply | Qty: 30 | Fill #0

## 2015-07-04 MED FILL — ATORVASTATIN 80 MG TABLET: 80 | 30 days supply | Qty: 30 | Fill #0

## 2015-07-04 MED FILL — CARVEDILOL 25 MG TABLET: 25 | 30 days supply | Qty: 60 | Fill #0

## 2015-07-04 NOTE — Progress Notes (Signed)
Patient ID: VERONE PARI, female   DOB: 08-04-1945, 70 y.o.   MRN: [id number] [facility] Subjective:   Patient ID: TIAJAH LABEAN female   DOB: 06-19-1945 70 y.o.   MRN: [id number]  HPI: Ms.Basia D Pentland is a 70 y.o. 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  Hypertension: Her pressures have been in the 160-170s at home.  She adamantly tells me she's been taking all of her medications as prescribed except the hydralazine, but the pharmacist told me she has not refilled her medication since August 2016.   Perioral numbness: She was having perioral numbness last time I saw her; this has resolved since stopping the spironolactone.  She continues to smoke and I have reviewed her medications with her today.  Review of Systems  Constitutional: Positive for weight loss. Negative for fever, chills and malaise/fatigue.  Respiratory: Negative for cough and shortness of breath.   Cardiovascular: Negative for chest pain, orthopnea and claudication.  Neurological: Negative for dizziness, focal weakness, loss of consciousness and headaches.   Objective:  Physical Exam: Filed Vitals:   07/04/15 1508  BP: 166/58  Pulse: 71  Temp: 98.2 F (36.8 C)  TempSrc: Oral  Resp: 18  Height: 5\' 5"  (1.651 m)  Weight: 133 lb 4.8 oz (60.464 kg)  SpO2: 100%   General: resting in chair comfortably, appropriately conversational Cardiac: regular rate and rhythm, no rubs, murmurs or gallops Pulm: breathing well, clear to auscultation bilaterally Ext: warm and well perfused, without pedal edema  Assessment & Plan:  Case discussed with Dr. Lynnae January  Resistant hypertension Pharmacy informed me today she had not been refilling  her medications since August which helps explain her resistant hypertension; today, she adamantly refutes this. Today, I have stopped amlodipine and chlorthalidone due to her proteinuria and uptrending creatinine. I've also stopped hydralazine because she admitted she had not been taking this anyway. Now, she is on lisinopril 40 mg and carvedilol 25 mg twice daily due to her coronary artery disease and Imdur 60 mg daily. At her next visit, should she continue to be hypertensive, we need to again ask her about compliance. We can always add diltiazem to help her proteinuria.  Dysgeusia Her perioral numbness and dysgeusia have improved since stopping the spironolactone so I don't feel strongly about a brain MRI for now.  Other Orders Orders Placed This Encounter  Procedures  . Glucose, capillary   Follow Up: Return in about 4 weeks (around 08/01/2015).

## 2015-07-04 NOTE — Patient Instructions (Signed)
Stephanie Gross,  It was great to see you today!  Your blood pressure is looking much better but as we talked we need to be cautious about your kidney function.  I've stopped 2 medications that can worsen your kidney function: amlodipine and chlorthalidone.  I've updated your medication list as well.  Keep checking your blood pressures at home and we'll see you in 4 weeks.  Thanks for hanging in there in trying to get this under control. I really want to help prevent another stroke but it's a tough balance ensuring your kidneys can handle it.  Take care, Dr. Melburn Hake

## 2015-07-04 NOTE — Assessment & Plan Note (Signed)
Her perioral numbness and dysgeusia have improved since stopping the spironolactone so I don't feel strongly about a brain MRI for now.

## 2015-07-04 NOTE — Assessment & Plan Note (Signed)
Pharmacy informed me today she had not been refilling her medications since August which helps explain her resistant hypertension; today, she adamantly refutes this. Today, I have stopped amlodipine and chlorthalidone due to her proteinuria and uptrending creatinine. I've also stopped hydralazine because she admitted she had not been taking this anyway. Now, she is on lisinopril 40 mg and carvedilol 25 mg twice daily due to her coronary artery disease and Imdur 60 mg daily. At her next visit, should she continue to be hypertensive, we need to again ask her about compliance. We can always add diltiazem to help her proteinuria.

## 2015-07-04 NOTE — Progress Notes (Signed)
Patient did not bring medications to visit, states she is taking all but hydralazine currently.

## 2015-07-05 MED ORDER — INSULIN GLARGINE 100 UNIT/ML SOLOSTAR PEN: 40.0000 [IU] | PEN_INJECTOR | Freq: Every day | SUBCUTANEOUS | Status: DC

## 2015-07-06 ENCOUNTER — Encounter: Payer: Self-pay | Admitting: Pharmacist

## 2015-07-06 ENCOUNTER — Ambulatory Visit: Payer: Medicare Other | Admitting: Pharmacist

## 2015-07-06 ENCOUNTER — Ambulatory Visit: Payer: Self-pay | Admitting: Pharmacist

## 2015-07-06 DIAGNOSIS — Z79899 Other long term (current) drug therapy: Secondary | ICD-10-CM

## 2015-07-06 DIAGNOSIS — Z719 Counseling, unspecified: Secondary | ICD-10-CM

## 2015-07-06 NOTE — Progress Notes (Addendum)
Medication(s) were reviewed with the patient, including name, instructions, indication, goals of therapy, potential side effects, importance of adherence, and safe use. Assistance with medications from [facility] outpatient pharmacy.  Patient verbalized understanding by repeating back information and was advised to contact me if further medication-related questions arise. Patient was also provided an information handout.

## 2015-07-06 NOTE — Patient Instructions (Signed)
Patient educated about medication as defined in this encounter and verbalized understanding by repeating back instructions provided.   

## 2015-07-06 NOTE — Progress Notes (Signed)
Internal Medicine Clinic Attending  Case discussed with Dr. Flores at the time of the visit.  We reviewed the resident's history and exam and pertinent patient test results.  I agree with the assessment, diagnosis, and plan of care documented in the resident's note. 

## 2015-08-06 ENCOUNTER — Telehealth: Payer: Self-pay | Admitting: Acute Care

## 2015-08-06 ENCOUNTER — Encounter: Payer: Self-pay | Admitting: Internal Medicine

## 2015-08-06 ENCOUNTER — Ambulatory Visit (INDEPENDENT_AMBULATORY_CARE_PROVIDER_SITE_OTHER): Payer: Medicare Other | Admitting: Internal Medicine

## 2015-08-06 VITALS — BP 110/58 | HR 77 | Ht 65.5 in | Wt 133.4 lb

## 2015-08-06 DIAGNOSIS — I10 Essential (primary) hypertension: Secondary | ICD-10-CM

## 2015-08-06 DIAGNOSIS — R634 Abnormal weight loss: Secondary | ICD-10-CM | POA: Diagnosis not present

## 2015-08-06 DIAGNOSIS — Z720 Tobacco use: Secondary | ICD-10-CM

## 2015-08-06 DIAGNOSIS — F17210 Nicotine dependence, cigarettes, uncomplicated: Secondary | ICD-10-CM

## 2015-08-06 DIAGNOSIS — I2510 Atherosclerotic heart disease of native coronary artery without angina pectoris: Secondary | ICD-10-CM

## 2015-08-06 DIAGNOSIS — Z955 Presence of coronary angioplasty implant and graft: Secondary | ICD-10-CM

## 2015-08-06 MED ORDER — ISOSORBIDE MONONITRATE ER 30 MG PO TB24: 60.0000 mg | ORAL_TABLET | Freq: Every day | ORAL | Status: DC

## 2015-08-06 NOTE — Assessment & Plan Note (Signed)
She has struggled with antihypertensive compliance in the past, but now that she is actually taking her medications, we seem to be dropping her pressure too low. She complaints of lightheadedness upon standing over the last few months. Thus, I have dropped her Imdur from 60 to 30 mg daily; we will resume carvedilol 25 mg twice daily and lisinopril 40 mg daily given her history of coronary artery disease.

## 2015-08-06 NOTE — Patient Instructions (Signed)
Stephanie Gross,  It was great to see you again today.  As we discussed, I think your change in taste is most likely related to your stroke; this affected a part of your brain called the thalamus that controls taste.  However, because you have been losing weight unintentionally, we doctors always very concerned about scary things such as cancer and infection. I don't think you have lung cancer, bu given your smoking history, I think it is a good idea to do a lung cancer screening test. I referred you for that today. I will also check some basic labs and screen you for some infections. I will call you if anything comes back abnormal.  I've also printed off a pamphlet on plantar fasciitis. If you continue to have pain, I'm okay with trying some gabapentin. Just give the clinic a call, and I can call the prescription into your pharmacy.  Take care, and I'll see you back in 1 month, Dr. Melburn Hake

## 2015-08-06 NOTE — Progress Notes (Signed)
[facility] Subjective:   Patient ID: Stephanie Gross female   DOB: 1946-04-11 70 y.o.   MRN: [id number]  HPI: [name] is a 70 y.o. female with coronary artery disease status post drug-eluting stent placement to the right coronary artery in 2007, ischemic stroke of bilateral thalami in 2016, resistant hypertension, type 2 diabetes, and tobacco use disorder, presenting for follow-up of hypertension and tobacco use disorder.  Unintentional weight loss: She has lost 15 pounds in the last year and a half. She tells me food "just tastes differently" since she had her thalamic stroke last August. She's able to eat peanuts, fish, and orange, but that's about it. She denies any fevers, night sweats, headache, vision changes, shoulder tightness or pain, jaw claudication, cough, hemoptysis, arthralgias, rashes, abdominal pain, depression, or any other complaints.  Hypertension: She has been taking lisinopril 40 mg daily, carvedilol 25 mg twice daily, and Imdur 60 mg daily. She sometimes feels dizzy and her pressures have been in the low 100s at home.  Tobacco use disorder: She continues to smoke 3-4 cigarettes daily and has smokes 1 pack per day for the last 52 years. She is trying to quit and doesn't want any help at the moment.  I have reviewed her medications with her today.  Please see the assessment and plan for the status of the patient's chronic medical problems.  Review of Systems  Constitutional: Negative for fever, weight loss and diaphoresis.  Eyes: Negative for blurred vision, photophobia and pain.  Respiratory: Negative for cough and shortness of breath.   Cardiovascular: Negative for chest pain, claudication and leg swelling.  Gastrointestinal: Negative for nausea, vomiting and abdominal pain.  Musculoskeletal: Negative for myalgias and joint pain.  Skin: Negative for rash.  Neurological: Positive for dizziness. Negative for sensory change,  speech change, loss of consciousness and headaches.       Dysgeusia  Psychiatric/Behavioral: Negative for depression and substance abuse.   Objective:  Physical Exam: Filed Vitals:   08/06/15 1507 08/06/15 1508  BP:  110/58  Pulse:  77  Height: 5' 5.5" (1.664 m)   Weight: 133 lb 6.4 oz (60.51 kg)   SpO2:  100%   General: resting in bed comfortably, appropriately conversational HEENT: no scleral icterus, extra-ocular muscles intact, oropharynx without lesions Cardiac: regular rate and rhythm, no rubs, murmurs or gallops Pulm: breathing well, clear to auscultation bilaterally Abd: bowel sounds normal, soft, nondistended, non-tender Ext: warm and well perfused, without pedal edema Lymph: no cervical or supraclavicular lymphadenopathy Skin: no rash, hair, or nail changes Neuro: alert and oriented X3, cranial nerves II-XII grossly intact, moving all extremities well  Assessment & Plan:  Case discussed with Dr. Eppie Gibson  Unintentional weight loss I think that her unintentional weight loss of 15 pounds over the last year is primarily related to dysgeusia caused by her thalamic stroke last August. However, she is a long-term smoker, and given her weight loss, she would like to get a lung cancer screening test, which I agree is reasonable. She has a 50+ pack year smoking history and continues to smoke, so she qualifies for the test. Although she doesn't have clinical signs of infection, she's never been screened for HIV so I'll do that now as well. Lastly, she had a markedly elevated CRP of 80 last year, but I cannot find any rheumatologic, malignant, nor infectious explanations to explain that lab. I'll check another CRP to see what it shows today.  Resistant hypertension She  has struggled with antihypertensive compliance in the past, but now that she is actually taking her medications, we seem to be dropping her pressure too low. She complaints of lightheadedness upon standing over the last few  months. Thus, I have dropped her Imdur from 60 to 30 mg daily; we will resume carvedilol 25 mg twice daily and lisinopril 40 mg daily given her history of coronary artery disease.   Medications Ordered Meds ordered this encounter  Medications  . isosorbide mononitrate (IMDUR) 30 MG 24 hr tablet    Sig: Take 2 tablets (60 mg total) by mouth daily.    Dispense:  90 tablet    Refill:  3   Other Orders Orders Placed This Encounter  Procedures  . CT CHEST LUNG CA SCREEN LOW DOSE W/O CM    Standing Status: Future     Number of Occurrences:      Standing Expiration Date: 10/05/2016    Order Specific Question:  Reason for Exam (SYMPTOM  OR DIAGNOSIS REQUIRED)    Answer:  44 pack year smoking history, still smoking, unintentional weight loss    Order Specific Question:  Preferred Imaging Location?    Answer:  [facility]  . CBC with Diff  . HIV antibody (with reflex)  . CRP (C-Reactive Protein)  . BMP8+Anion Gap   Follow Up: Return in about 4 weeks (around 09/03/2015).

## 2015-08-06 NOTE — Assessment & Plan Note (Addendum)
I think that her unintentional weight loss of 15 pounds over the last year is primarily related to dysgeusia caused by her thalamic stroke last August. However, she is a long-term smoker, and given her weight loss, she would like to get a lung cancer screening test, which I agree is reasonable. She has a 50+ pack year smoking history and continues to smoke, so she qualifies for the test. Although she doesn't have clinical signs of infection, she's never been screened for HIV so I'll do that now as well. Lastly, she had a markedly elevated CRP of 80 last year, but I cannot find any rheumatologic, malignant, nor infectious explanations to explain that lab. I'll check another CRP to see what it shows today.  Addendum: Her CRP has since normalized, HIV antibody is non-reactive, and BMP was reassuring. I'll follow up her low dose lung CT when it comes back. Thus far, her dysgeusia seems most likely related to her thalamic stroke. I'll offer neurologic referral when I see her again.

## 2015-08-07 LAB — BMP8+ANION GAP
ANION GAP: 15 mmol/L (ref 10.0–18.0)
BUN/Creatinine Ratio: 16 (ref 12–28)
BUN: 28 mg/dL — ABNORMAL HIGH (ref 8–27)
CHLORIDE: 105 mmol/L (ref 96–106)
CO2: 20 mmol/L (ref 18–29)
Calcium: 9.5 mg/dL (ref 8.7–10.3)
Creatinine, Ser: 1.71 mg/dL — ABNORMAL HIGH (ref 0.57–1.00)
GFR calc Af Amer: 35 mL/min/{1.73_m2} — ABNORMAL LOW (ref >59)
GFR, EST NON AFRICAN AMERICAN: 31 mL/min/{1.73_m2} — AB (ref >59)
GLUCOSE: 107 mg/dL — AB (ref 65–99)
POTASSIUM: 4.9 mmol/L (ref 3.5–5.2)
SODIUM: 140 mmol/L (ref 134–144)

## 2015-08-07 LAB — CBC WITH DIFFERENTIAL/PLATELET
BASOS ABS: 0.1 10*3/uL (ref 0.0–0.2)
Basos: 1 %
EOS (ABSOLUTE): 0.1 10*3/uL (ref 0.0–0.4)
EOS: 2 %
HEMATOCRIT: 30.9 % — AB (ref 34.0–46.6)
HEMOGLOBIN: 9.6 g/dL — AB (ref 11.1–15.9)
IMMATURE GRANS (ABS): 0 10*3/uL (ref 0.0–0.1)
IMMATURE GRANULOCYTES: 0 %
LYMPHS: 32 %
Lymphocytes Absolute: 2.1 10*3/uL (ref 0.7–3.1)
MCH: 28.2 pg (ref 26.6–33.0)
MCHC: 31.1 g/dL — ABNORMAL LOW (ref 31.5–35.7)
MCV: 91 fL (ref 79–97)
MONOCYTES: 8 %
Monocytes Absolute: 0.5 10*3/uL (ref 0.1–0.9)
Neutrophils Absolute: 3.7 10*3/uL (ref 1.4–7.0)
Neutrophils: 57 %
Platelets: 204 10*3/uL (ref 150–379)
RBC: 3.41 x10E6/uL — AB (ref 3.77–5.28)
RDW: 14.4 % (ref 12.3–15.4)
WBC: 6.4 10*3/uL (ref 3.4–10.8)

## 2015-08-07 LAB — C-REACTIVE PROTEIN: CRP: 6.7 mg/L — AB (ref 0.0–4.9)

## 2015-08-07 LAB — HIV ANTIBODY (ROUTINE TESTING W REFLEX): HIV SCREEN 4TH GENERATION: NONREACTIVE

## 2015-08-07 NOTE — Telephone Encounter (Signed)
[name]

## 2015-08-07 NOTE — Progress Notes (Signed)
Case discussed with Dr. Flores at the time of the visit. We reviewed the resident's history and exam and pertinent patient test results. I agree with the assessment, diagnosis, and plan of care documented in the resident's note. 

## 2015-08-07 NOTE — Addendum Note (Signed)
Addended by: Yvonna Alanis E on: 08/07/2015 12:08 PM   Modules accepted: Orders

## 2015-08-07 NOTE — Telephone Encounter (Signed)
lmtcb x1 for pt. There are questions we have for her to see if she qualifies for this program.  I have called [facility] and let them know that we are reaching out to the pt.

## 2015-08-14 ENCOUNTER — Ambulatory Visit (HOSPITAL_COMMUNITY): Admission: RE | Admit: 2015-08-14 | Payer: Medicare Other | Source: Ambulatory Visit

## 2015-08-14 NOTE — Telephone Encounter (Signed)
Called spoke with spouse, pt actually had her LDCT scheduled for today under the referring provider but w/ Medicare and lack of [place] insurance will likely NOT pay for the CT.  Verified this with Judson Roch and discussed with spouse who will inform pt so she can cancel the LDCT and call us back to schedule the [facility].

## 2015-08-23 NOTE — Telephone Encounter (Signed)
[phone], pt calling states someone from our office called her

## 2015-08-29 NOTE — Telephone Encounter (Signed)
LM with pt's husband to have pt return call.

## 2015-08-31 ENCOUNTER — Telehealth: Payer: Self-pay | Admitting: Internal Medicine

## 2015-08-31 ENCOUNTER — Telehealth: Payer: Self-pay | Admitting: Pharmacist

## 2015-08-31 DIAGNOSIS — I10 Essential (primary) hypertension: Secondary | ICD-10-CM

## 2015-08-31 DIAGNOSIS — E785 Hyperlipidemia, unspecified: Principal | ICD-10-CM

## 2015-08-31 DIAGNOSIS — E1169 Type 2 diabetes mellitus with other specified complication: Secondary | ICD-10-CM

## 2015-08-31 MED ORDER — ISOSORBIDE MONONITRATE ER 30 MG PO TB24: 60.0000 mg | ORAL_TABLET | Freq: Every day | ORAL | Status: DC

## 2015-08-31 MED ORDER — ATORVASTATIN CALCIUM 80 MG PO TABS
80.0000 mg | ORAL_TABLET | Freq: Every day | ORAL | Status: DC
Start: 2015-08-31 — End: 2016-06-02

## 2015-08-31 NOTE — Telephone Encounter (Signed)
APT. REMINDER CALL, LMTCB °

## 2015-08-31 NOTE — Telephone Encounter (Signed)
Patient requested help transferring prescriptions to [facility] mail order pharmacy. Called [facility] to ensure prescriptions were on file (needed new Rx for atorvastatin and isosorbide mononitrate sent, approved by PCP). Also called [facility] to cancel prescriptions on file.

## 2015-09-03 ENCOUNTER — Encounter: Payer: Self-pay | Admitting: Internal Medicine

## 2015-09-04 ENCOUNTER — Encounter: Payer: Self-pay | Admitting: *Deleted

## 2015-09-05 ENCOUNTER — Telehealth: Payer: Self-pay | Admitting: Acute Care

## 2015-09-05 NOTE — Telephone Encounter (Signed)
Called spoke with pt. Reviewed lung cancer screening information and pt does qualify. Pt states that she will have to call back to schedule her SDMV due to her work schedule. I explained to her that if i didn't not hear back from her this week i would contact her next week. She voiced understanding and had no further questions.

## 2015-09-10 ENCOUNTER — Encounter: Payer: Self-pay | Admitting: Internal Medicine

## 2015-09-10 ENCOUNTER — Ambulatory Visit (INDEPENDENT_AMBULATORY_CARE_PROVIDER_SITE_OTHER): Payer: Medicare Other | Admitting: Internal Medicine

## 2015-09-10 VITALS — BP 150/65 | HR 72 | Temp 98.1°F | Ht 65.5 in | Wt 133.6 lb

## 2015-09-10 DIAGNOSIS — F17210 Nicotine dependence, cigarettes, uncomplicated: Secondary | ICD-10-CM | POA: Diagnosis not present

## 2015-09-10 DIAGNOSIS — I10 Essential (primary) hypertension: Secondary | ICD-10-CM | POA: Diagnosis not present

## 2015-09-10 DIAGNOSIS — E119 Type 2 diabetes mellitus without complications: Secondary | ICD-10-CM | POA: Diagnosis not present

## 2015-09-10 DIAGNOSIS — Z720 Tobacco use: Secondary | ICD-10-CM

## 2015-09-10 DIAGNOSIS — Z794 Long term (current) use of insulin: Secondary | ICD-10-CM | POA: Diagnosis not present

## 2015-09-10 DIAGNOSIS — E1100 Type 2 diabetes mellitus with hyperosmolarity without nonketotic hyperglycemic-hyperosmolar coma (NKHHC): Secondary | ICD-10-CM

## 2015-09-10 DIAGNOSIS — Z79899 Other long term (current) drug therapy: Secondary | ICD-10-CM

## 2015-09-10 LAB — POCT GLYCOSYLATED HEMOGLOBIN (HGB A1C): Hemoglobin A1C: 6.5

## 2015-09-10 LAB — GLUCOSE, CAPILLARY: Glucose-Capillary: 144 mg/dL — ABNORMAL HIGH (ref 65–99)

## 2015-09-10 MED ORDER — INSULIN GLARGINE 100 UNIT/ML ~~LOC~~ SOLN: 30.0000 [IU] | Freq: Every day | SUBCUTANEOUS | Status: DC

## 2015-09-10 MED ORDER — ISOSORBIDE MONONITRATE ER 30 MG PO TB24: 30.0000 mg | ORAL_TABLET | Freq: Every day | ORAL | Status: DC

## 2015-09-10 MED ORDER — AMLODIPINE BESYLATE 5 MG PO TABS: 5.0000 mg | ORAL_TABLET | Freq: Every day | ORAL | Status: DC

## 2015-09-10 NOTE — Assessment & Plan Note (Addendum)
Her diabetes is well-controlled based off her a1c of 6.5 today, but I'd like to get more data to titrate her insulin. I'm worried she may have sentinel hypoglycemic episodes because she's been having low energy, is on a beta blocker, and she's only checking her sugars sporadically. She's currently taking Lantus 40U nightly and Novolog sliding scale between 1-4 units.  I've asked her to drop the Lantus to 30U nightly, stop the sliding scale for now, and check her morning sugars and be hyper-aware of hypoglycemic symptoms. We're limited on further oral agents because of her CKD.

## 2015-09-10 NOTE — Progress Notes (Signed)
Patient ID: Stephanie Gross, female   DOB: June 26, 1945, 70 y.o.   MRN: [id number] [facility] Subjective:   Patient ID: Stephanie Gross female   DOB: [birth year] 66 y.o.   MRN: [id number]  HPI: [name] is a 70 y.o. female with coronary artery disease status post drug-eluting stent placement to the right coronary artery in 2007, ischemic stroke of bilateral thalami in 2016, resistant hypertension, type 2 diabetes, and tobacco use disorder, presenting for follow-up of hypertension and tobacco use disorder.  Hypertension: She is compliant with her medications and her home pressures have been in the 140s/60s. She denies any orthostatic hypotension.  Diabetes: She is taking Lantus 40U nightly and Novolog sliding scale with meals. She denies feeling hypoglycemic but she checks her sugars sporadically and did not bring her meter.  Tobacco use: She is down to smoking 1 cigarette daily but is not ready to fully quit.  I have reviewed her medications with her today.  Review of Systems  Constitutional: Negative for fever and chills.  Eyes: Negative for blurred vision and double vision.  Respiratory: Negative for shortness of breath.   Cardiovascular: Negative for chest pain, claudication and leg swelling.  Neurological: Negative for dizziness, tingling, loss of consciousness and headaches.   Objective:  Physical Exam: Filed Vitals:   09/10/15 1603  BP: 150/65  Pulse: 72  Temp: 98.1 F (36.7 C)  TempSrc: Oral  Height: 5' 5.5" (1.664 m)  Weight: 133 lb 9.6 oz (60.601 kg)  SpO2: 100%   General: resting in chair comfortably, appropriately conversational Cardiac: regular rate and rhythm, no rubs, murmurs or gallops Pulm: breathing well, clear to auscultation bilaterally Abd: bowel sounds normal, soft, nondistended, non-tender Ext: warm and well perfused, without pedal edema Neuro: alert and oriented X3, cranial nerves II-XII grossly intact,  moving all extremities well  Assessment & Plan:  Case discussed with Dr. Dareen Piano  Resistant hypertension Managing her hypertension is crucial given her history of stroke and coronary artery disease but compliance has always been a big issue. We've finally found a regimen she is taking consistently. However, she continues to be hypertensive today at 170/60 on my manual read, compared to her self-reported readings of 140/60, so I've added amlodipine 5mg  daily in addition to lisinopril 40mg  daily, carvedilol 25mg  twice daily, and Imdur 30mg  daily. She does have an impressive pulse pressure but I don't appreciate a diastolic murmur suggestive of aortic regurgitation. I've asked her to bring her blood pressure log at the next visit as well as her blood pressure cuff so we can compare the two. If she continues to be hypertensive, we can increase her amlodipine to 10mg  daily.  DM (diabetes mellitus) type 2, uncontrolled, with ketoacidosis Her diabetes is well-controlled based off her a1c of 6.5 today, but I'd like to get more data to titrate her insulin. I'm worried she may have sentinel hypoglycemic episodes because she's been having low energy, is on a beta blocker, and she's only checking her sugars sporadically. She's currently taking Lantus 40U nightly and Novolog sliding scale between 1-4 units.  I've asked her to drop the Lantus to 30U nightly, stop the sliding scale for now, and check her morning sugars and be hyper-aware of hypoglycemic symptoms. We're limited on further oral agents because of her CKD.   Medications Ordered Meds ordered this encounter  Medications  . amLODipine (NORVASC) 5 MG tablet    Sig: Take 1 tablet (5 mg total) by mouth daily.  Dispense:  30 tablet    Refill:  11  . insulin glargine (LANTUS) 100 UNIT/ML injection    Sig: Inject 0.3 mLs (30 Units total) into the skin at bedtime.    Dispense:  10 mL    Refill:  11  . isosorbide mononitrate (IMDUR) 30 MG 24 hr tablet     Sig: Take 1 tablet (30 mg total) by mouth daily.    Dispense:  90 tablet    Refill:  3   Other Orders Orders Placed This Encounter  Procedures  . Glucose, capillary  . POCT HgB A1C (CPT [phone])   Follow Up: Return in about 4 weeks (around 10/08/2015) for diabetes follow-up.

## 2015-09-10 NOTE — Assessment & Plan Note (Signed)
Managing her hypertension is crucial given her history of stroke and coronary artery disease but compliance has always been a big issue. We've finally found a regimen she is taking consistently. However, she continues to be hypertensive today at 170/60 on my manual read, compared to her self-reported readings of 140/60, so I've added amlodipine 5mg  daily in addition to lisinopril 40mg  daily, carvedilol 25mg  twice daily, and Imdur 30mg  daily. She does have an impressive pulse pressure but I don't appreciate a diastolic murmur suggestive of aortic regurgitation. I've asked her to bring her blood pressure log at the next visit as well as her blood pressure cuff so we can compare the two. If she continues to be hypertensive, we can increase her amlodipine to 10mg  daily.

## 2015-09-10 NOTE — Assessment & Plan Note (Signed)
She is down to smoking 1 cigarette daily but is not ready to quit entirely. We should continue to address this at each visit.

## 2015-09-11 ENCOUNTER — Telehealth: Payer: Self-pay | Admitting: Internal Medicine

## 2015-09-11 NOTE — Telephone Encounter (Signed)
error 

## 2015-09-11 NOTE — Progress Notes (Signed)
Internal Medicine Clinic Attending  Case discussed with Dr. Flores at the time of the visit.  We reviewed the resident's history and exam and pertinent patient test results.  I agree with the assessment, diagnosis, and plan of care documented in the resident's note. 

## 2015-09-27 ENCOUNTER — Encounter: Payer: Self-pay | Admitting: Cardiovascular Disease

## 2015-10-02 ENCOUNTER — Ambulatory Visit (INDEPENDENT_AMBULATORY_CARE_PROVIDER_SITE_OTHER): Payer: Medicare Other | Admitting: Cardiovascular Disease

## 2015-10-02 ENCOUNTER — Encounter: Payer: Self-pay | Admitting: Cardiovascular Disease

## 2015-10-02 VITALS — BP 184/80 | HR 80 | Ht 65.5 in | Wt 133.8 lb

## 2015-10-02 DIAGNOSIS — Z720 Tobacco use: Secondary | ICD-10-CM | POA: Diagnosis not present

## 2015-10-02 DIAGNOSIS — E785 Hyperlipidemia, unspecified: Secondary | ICD-10-CM

## 2015-10-02 DIAGNOSIS — Z8673 Personal history of transient ischemic attack (TIA), and cerebral infarction without residual deficits: Secondary | ICD-10-CM | POA: Diagnosis not present

## 2015-10-02 DIAGNOSIS — I2510 Atherosclerotic heart disease of native coronary artery without angina pectoris: Secondary | ICD-10-CM

## 2015-10-02 DIAGNOSIS — I10 Essential (primary) hypertension: Secondary | ICD-10-CM

## 2015-10-02 MED ORDER — AMLODIPINE BESYLATE 10 MG PO TABS: 10.0000 mg | ORAL_TABLET | Freq: Every day | ORAL | Status: DC

## 2015-10-02 NOTE — Patient Instructions (Signed)
Medication Instructions:  Your physician has recommended you make the following change in your medication: Increase Norvasc to 10 mg by mouth daily.    Labwork: none  Testing/Procedures: none  Follow-Up: Your physician wants you to follow-up in: 12 months.  You will receive a reminder letter in the mail two months in advance. If you don't receive a letter, please call our office to schedule the follow-up appointment.   Any Other Special Instructions Will Be Listed Below (If Applicable).     If you need a refill on your cardiac medications before your next appointment, please call your pharmacy.

## 2015-10-02 NOTE — Progress Notes (Signed)
Chief Complaint  Patient presents with  . Weight Loss    History of Present Illness:  70 yo female with history of CAD, DM, HTN, HLD, tobacco abuse, CVA October 2013, GI bleeding here today for cardiac follow up. She was previously followed by Dr. [name] and then Dr. Haroldine Laws. She has a history of coronary artery disease status post Taxus DES in the RCA in January 2007. Most recent cardiac catheterization was January 2008, which showed minimal nonobstructive coronary artery disease with a patent RCA stent; however, there was a 90% stenosis in the midsection of the small PDA. This was treated medically. She had a stress myoview in 11/10 for atypical chest pain that showed no ischemia, LVEF 70%. Normal ABI 2010. She had not tolerated statins in the past due to muscle weakness. This included Crestor. She is now Lipitor. Stroke in October 2013. She was seen in 2015 and had heme positive stool and gastric ulcer, esophagitis. Resolved on PPI. I last saw her in April 2016 and she was doing well. Last echo August 2016 with normal LV systolic function, mild AI.   She is here today for cardiac follow up. She feels well. No chest pain or dyspnea. Still smoking 1/4 ppd. Still taking Plavix. She is back on Lipitor but only taking several days per week.   Primary Care Physician: Loleta Chance, MD  Last Lipid Profile: Followed in primary care.   Past Medical History  Diagnosis Date  . Hypertension   . Diabetes mellitus without complication ([place])   . CAD (coronary artery disease)     s/p RCA stent 2007. Myoview 2010 normal  . DM (diabetes mellitus) ([place])   . HTN (hypertension)   . Hyperlipidemia   . GERD (gastroesophageal reflux disease)   . 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 Arthritis   . Obesity   . AVD (aortic valve disease)   . Vitamin D deficiency   . Chronic obstructive bronchitis without exacerbation ([place])   . Derangement of left knee     . Degeneration of meniscus of knee   . DM (diabetes mellitus) type 2, uncontrolled, with ketoacidosis ([place]) 03/12/1994    HIGH RISK FEET.. Please have patient take shoes and socks off every visit for visual foot inspection.  NPDR [facility] CSDME on 09/2011  Qualifier: Diagnosis of  By: Tomasa Hosteller MD, Edmon Crape.      Past Surgical History  Procedure Laterality Date  . Cholecystectomy    . Coronary angioplasty with stent placement    . Abdominal hysterectomy    . Shoulder surgery    . Knee surgery      Current Outpatient Prescriptions  Medication Sig Dispense Refill  . amLODipine (NORVASC) 10 MG tablet Take 1 tablet (10 mg total) by mouth daily. 90 tablet 3  . aspirin EC 81 MG tablet Take 81 mg by mouth daily.    Marland Kitchen atorvastatin (LIPITOR) 80 MG tablet Take 1 tablet (80 mg total) by mouth daily at 6 PM. 90 tablet 3  . Blood Glucose Monitoring Suppl (ONE TOUCH ULTRA MINI) W/DEVICE KIT Check blood sugar 3 times a day 100 each 5  . carvedilol (COREG) 25 MG tablet Take 1 tablet (25 mg total) by mouth 2 (two) times daily with a meal. 180 tablet 3  . glucose blood (ONE TOUCH ULTRA TEST) test strip Check blood sugar before and after meals and bedtime 250.00 insulin requiring 100 each 5  .  insulin glargine (LANTUS) 100 UNIT/ML injection Inject 0.3 mLs (30 Units total) into the skin at bedtime. 10 mL 11  . isosorbide mononitrate (IMDUR) 30 MG 24 hr tablet Take 1 tablet (30 mg total) by mouth daily. 90 tablet 3  . lisinopril (PRINIVIL,ZESTRIL) 40 MG tablet Take 1 tablet (40 mg total) by mouth daily. 90 tablet 3  . ONETOUCH DELICA LANCETS FINE MISC check blood sugar 3 times a day 100 each 5   No current facility-administered medications for this visit.    Allergies  Allergen Reactions  . Benazepril-Hydrochlorothiazide Other (See Comments)    UNKNOWN. Ok with lisinopril  . Chantix [Varenicline]     SUICIDAL IDEATION  . Codeine Nausea And Vomiting  . Methylprednisolone Other (See Comments)    UNKNOWN   . Statins Other (See Comments)    "walking into a hole", heavy legs, feels like she is going to fall    Social History   Social History  . Marital Status: Single    Spouse Name: N/A  . Number of Children: N/A  . Years of Education: N/A   Occupational History  . unemployed    Social History Main Topics  . Smoking status: Former Smoker -- 0.30 packs/day for 30 years    Types: Cigarettes  . Smokeless tobacco: Never Used     Comment: stopped 05/28/2014. Started again 4-5 cigs./day  . Alcohol Use: No  . Drug Use: No  . Sexual Activity: Not on file   Other Topics Concern  . Not on file   Social History Narrative   ** Merged History Encounter **       Patient recently lost her job and is currently without insurance. - Nov 2012    Family History  Problem Relation Age of Onset  . Stroke Mother   . Diabetes Mother   . Diabetes Maternal Grandmother   . Diabetes Sister   . Hypertension Mother     Review of Systems:  As stated in the HPI and otherwise negative.   BP 184/80 mmHg  Pulse 80  Ht 5' 5.5" (1.664 m)  Wt 133 lb 12.8 oz (60.691 kg)  BMI 21.92 kg/m2  Physical Examination: General: Well developed, well nourished, NAD HEENT: OP clear, mucus membranes moist SKIN: warm, dry. No rashes. Neuro: No focal deficits Musculoskeletal: Muscle strength 5/5 all ext Psychiatric: Mood and affect normal Neck: No JVD, no carotid bruits, no thyromegaly, no lymphadenopathy. Lungs:Clear bilaterally, no wheezes, rhonci, crackles Cardiovascular: Regular rate and rhythm. No murmurs, gallops or rubs. Abdomen:Soft. Bowel sounds present. Non-tender.  Extremities: No lower extremity edema. Pulses are 2 + in the bilateral DP/PT.  Echo 12/09/14: Left ventricle: The cavity size was normal. Wall thickness was  increased in a pattern of mild LVH. Systolic function was  vigorous. The estimated ejection fraction was in the range of 65%  to 70%. Wall motion was normal; there were no  regional wall  motion abnormalities. Doppler parameters are consistent with  abnormal left ventricular relaxation (grade 1 diastolic  dysfunction). - Aortic valve: There was mild regurgitation. - Left atrium: The atrium was mildly dilated. - Right atrium: The atrium was mildly dilated. - Pericardium, extracardiac: A trivial pericardial effusion was  identified.  EKG:  EKG is  ordered today. The ekg ordered today demonstrates NSR, rate 80 bpm. LAE. LVH. Non-specific T wave abnormality.   Recent Labs: 12/08/2014: Hemoglobin 10.4* 06/04/2015: TSH 0.666 06/27/2015: ALT 8 08/06/2015: BUN 28*; Creatinine, Ser 1.71*; Platelets 204; Potassium 4.9; Sodium 140  Lipid Panel    Component Value Date/Time   CHOL 207* 05/21/2015 1550   CHOL 190 12/09/2014 0612   TRIG 160* 05/21/2015 1550   HDL 55 05/21/2015 1550   HDL 40* 12/09/2014 0612   CHOLHDL 3.8 05/21/2015 1550   CHOLHDL 4.8 12/09/2014 0612   VLDL 27 12/09/2014 0612   LDLCALC 120* 05/21/2015 1550   LDLCALC 123* 12/09/2014 0612   LDLDIRECT 111* 09/15/2008 2047     Wt Readings from Last 3 Encounters:  10/02/15 133 lb 12.8 oz (60.691 kg)  09/10/15 133 lb 9.6 oz (60.601 kg)  08/06/15 133 lb 6.4 oz (60.51 kg)     Other studies Reviewed: Additional studies/ records that were reviewed today include:  Review of the above records demonstrates:    Assessment and Plan:   1. CORONARY ARTERY DISEASE:No chest pain worrisome for unstable angina. Continue ASA, statin, beta blocker and Imdur  2. Hypertension: BP elevated today. She has this managed in primary care. I will increase Norvasc to 10 mg daily. She will follow at home and alert primary care if BP remains above 140/90.     3. Hyperlipidemia: She is now on Lipitor. She is taking several days per week. Last LDL under 130 but not at goal. Can consider Praluent injections. I have offered to refer her to the lipid clinic. She will let me know if she decides she wishes to be seen in  the lipid clinic.    4. Tobacco Abuse: Smoking cessation advised. She wishes to stop.   6. Prior CVA: Continue ASA  Current medicines are reviewed at length with the patient today.  The patient does not have concerns regarding medicines.  The following changes have been made:  no change  Labs/ tests ordered today include:   Orders Placed This Encounter  Procedures  . EKG 12-Lead    Disposition:   FU with me in 12  months   Signed, Lauree Chandler, MD 10/02/2015 4:44 PM    [facility] [address], [place], [place]  [address] Phone: [phone]; Fax: [phone]

## 2015-10-05 ENCOUNTER — Encounter: Payer: Self-pay | Admitting: Internal Medicine

## 2015-10-16 ENCOUNTER — Encounter: Payer: Self-pay | Admitting: *Deleted

## 2015-10-31 NOTE — Addendum Note (Signed)
Addended by: Hulan Fray on: 10/31/2015 07:05 PM   Modules accepted: Orders

## 2016-01-27 ENCOUNTER — Emergency Department (HOSPITAL_COMMUNITY)
Admission: EM | Admit: 2016-01-27 | Discharge: 2016-01-27 | Disposition: A | Discharge status: Home / Self Care | Payer: Medicare Other | Attending: Emergency Medicine | Admitting: Emergency Medicine

## 2016-01-27 ENCOUNTER — Encounter (HOSPITAL_COMMUNITY): Payer: Self-pay | Admitting: Emergency Medicine

## 2016-01-27 ENCOUNTER — Emergency Department (HOSPITAL_COMMUNITY): Payer: Medicare Other

## 2016-01-27 DIAGNOSIS — W1830XA Fall on same level, unspecified, initial encounter: Secondary | ICD-10-CM | POA: Insufficient documentation

## 2016-01-27 DIAGNOSIS — J449 Chronic obstructive pulmonary disease, unspecified: Secondary | ICD-10-CM | POA: Diagnosis not present

## 2016-01-27 DIAGNOSIS — E1110 Type 2 diabetes mellitus with ketoacidosis without coma: Secondary | ICD-10-CM | POA: Diagnosis not present

## 2016-01-27 DIAGNOSIS — Z8673 Personal history of transient ischemic attack (TIA), and cerebral infarction without residual deficits: Secondary | ICD-10-CM | POA: Insufficient documentation

## 2016-01-27 DIAGNOSIS — Z7982 Long term (current) use of aspirin: Secondary | ICD-10-CM | POA: Diagnosis not present

## 2016-01-27 DIAGNOSIS — I129 Hypertensive chronic kidney disease with stage 1 through stage 4 chronic kidney disease, or unspecified chronic kidney disease: Secondary | ICD-10-CM | POA: Diagnosis not present

## 2016-01-27 DIAGNOSIS — N183 Chronic kidney disease, stage 3 (moderate): Secondary | ICD-10-CM | POA: Insufficient documentation

## 2016-01-27 DIAGNOSIS — S92335A Nondisplaced fracture of third metatarsal bone, left foot, initial encounter for closed fracture: Secondary | ICD-10-CM | POA: Diagnosis not present

## 2016-01-27 DIAGNOSIS — Y92009 Unspecified place in unspecified non-institutional (private) residence as the place of occurrence of the external cause: Secondary | ICD-10-CM | POA: Insufficient documentation

## 2016-01-27 DIAGNOSIS — Z794 Long term (current) use of insulin: Secondary | ICD-10-CM | POA: Insufficient documentation

## 2016-01-27 DIAGNOSIS — I2510 Atherosclerotic heart disease of native coronary artery without angina pectoris: Secondary | ICD-10-CM | POA: Insufficient documentation

## 2016-01-27 DIAGNOSIS — E1122 Type 2 diabetes mellitus with diabetic chronic kidney disease: Secondary | ICD-10-CM | POA: Insufficient documentation

## 2016-01-27 DIAGNOSIS — Y939 Activity, unspecified: Secondary | ICD-10-CM | POA: Diagnosis not present

## 2016-01-27 DIAGNOSIS — S92322A Displaced fracture of second metatarsal bone, left foot, initial encounter for closed fracture: Secondary | ICD-10-CM | POA: Insufficient documentation

## 2016-01-27 DIAGNOSIS — Y999 Unspecified external cause status: Secondary | ICD-10-CM | POA: Insufficient documentation

## 2016-01-27 DIAGNOSIS — Z87891 Personal history of nicotine dependence: Secondary | ICD-10-CM | POA: Insufficient documentation

## 2016-01-27 DIAGNOSIS — S99922A Unspecified injury of left foot, initial encounter: Secondary | ICD-10-CM | POA: Diagnosis present

## 2016-01-27 MED ORDER — HYDROCODONE-ACETAMINOPHEN 5-325 MG PO TABS: 1.0000 | ORAL_TABLET | Freq: Four times a day (QID) | ORAL | 0 refills | Status: DC | PRN

## 2016-01-27 NOTE — Discharge Instructions (Signed)
You have two broken bones in your left foot.  Wear cam walker for protection.  Use walker as needed.  Follow up with orthopedist next week for further care.

## 2016-01-27 NOTE — ED Triage Notes (Signed)
Pt. slipped and fell at home this evening , reports left foot pain with mild swelling , denies LOC / ambulatory.

## 2016-01-27 NOTE — ED Notes (Signed)
Patient left at this time with all belongings. 

## 2016-01-27 NOTE — ED Provider Notes (Signed)
[facility] DEPT Provider Note   CSN: [id number] Arrival date & time: 01/27/16  1924   By signing my name below, I, Macon Large, attest that this documentation has been prepared under the direction and in the presence of Domenic Moras, [employer] ,  Electronically Signed: Macon Large, ED Scribe. 01/27/16. 7:54 PM.  History   Chief Complaint Chief Complaint  Patient presents with  . Foot Injury    The history is provided by the patient. No language interpreter was used.     HPI Comments: Stephanie Gross is a 70 y.o. female who has a PHx of DM and HTN who presents to the Emergency Department complaining of moderate, throbbing, left foot pain s/p mechanical fall that occurred at 4 PM. Pt states she was standing on her toilet while wearing socks to put up curtains, when she fell sideways and landed on her feet.  She states she is not sure if she struck her foot directly as she fell. She denies LOC, head injury. Pt is ambulatory without difficulty. Pt denies lightheadedness, dizziness, CP, SOB prior to falling. Pt states pain is worsened with weight bearing or with ambulation. Pt states she took Tylenol at 6 PM with no relief. She denies ankle pain, knee pain, numbness or tingling, additional injuries. She is a current smoker, half a pack a day.   Past Medical History:  Diagnosis Date  . Arthritis   . AVD (aortic valve disease)   . CAD (coronary artery disease)    s/p RCA stent 2007. Myoview 2010 normal  . Chronic obstructive bronchitis without exacerbation ([place])   . 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 Degeneration of meniscus of knee   . Derangement of left knee   . Diabetes mellitus without complication ([place])   . DM (diabetes mellitus) ([place])   . DM (diabetes mellitus) type 2, uncontrolled, with ketoacidosis ([place]) 03/12/1994   HIGH RISK FEET.. Please have patient take shoes and socks off every visit for visual foot  inspection.  NPDR [facility] CSDME on 09/2011  Qualifier: Diagnosis of  By: Tomasa Hosteller MD, Edmon Crape.    . GERD (gastroesophageal reflux disease)   . HTN (hypertension)   . Hyperlipidemia   . Hypertension   . Obesity   . Vitamin D deficiency     Patient Active Problem List   Diagnosis Date Noted  . CKD (chronic kidney disease) stage 3, GFR 30-59 ml/min 06/27/2015  . Tobacco abuse   . CVA (cerebral vascular accident) ([place]) 02/07/2012  . Preventative health care 03/13/2011  . Hyperlipidemia associated with type 2 diabetes mellitus ([place]) 03/12/2006  . Resistant hypertension 03/12/2006  . CORONARY ARTERY DISEASE 03/12/2006  . DM (diabetes mellitus) type 2, uncontrolled, with ketoacidosis ([place]) 03/12/1994    Past Surgical History:  Procedure Laterality Date  . ABDOMINAL HYSTERECTOMY    . CHOLECYSTECTOMY    . CORONARY ANGIOPLASTY WITH STENT PLACEMENT    . KNEE SURGERY    . SHOULDER SURGERY      OB History    No data available       Home Medications    Prior to Admission medications   Medication Sig Start Date End Date Taking? Authorizing Provider  amLODipine (NORVASC) 10 MG tablet Take 1 tablet (10 mg total) by mouth daily. 10/02/15 10/01/16  Burnell Blanks, MD  aspirin EC 81 MG tablet Take 81 mg by mouth daily.    Historical Provider, MD  atorvastatin (LIPITOR) 80 MG  tablet Take 1 tablet (80 mg total) by mouth daily at 6 PM. 08/31/15   Loleta Chance, MD  Blood Glucose Monitoring Suppl (ONE TOUCH ULTRA MINI) W/DEVICE KIT Check blood sugar 3 times a day 01/14/15   Loleta Chance, MD  carvedilol (COREG) 25 MG tablet Take 1 tablet (25 mg total) by mouth 2 (two) times daily with a meal. 12/11/14   Loleta Chance, MD  glucose blood (ONE TOUCH ULTRA TEST) test strip Check blood sugar before and after meals and bedtime 250.00 insulin requiring 01/14/15   Loleta Chance, MD  insulin glargine (LANTUS) 100 UNIT/ML injection Inject 0.3 mLs (30 Units total) into the skin at bedtime. 09/10/15   Loleta Chance, MD    isosorbide mononitrate (IMDUR) 30 MG 24 hr tablet Take 1 tablet (30 mg total) by mouth daily. 09/10/15   Loleta Chance, MD  lisinopril (PRINIVIL,ZESTRIL) 40 MG tablet Take 1 tablet (40 mg total) by mouth daily. 12/11/14   Loleta Chance, MD  [facility] DELICA LANCETS FINE MISC check blood sugar 3 times a day 01/14/15   Loleta Chance, MD    Family History Family History  Problem Relation Age of Onset  . Stroke Mother   . Diabetes Mother   . Hypertension Mother   . Diabetes Maternal Grandmother   . Diabetes Sister     Social History Social History  Substance Use Topics  . Smoking status: Former Smoker    Packs/day: 0.30    Years: 30.00    Types: Cigarettes  . Smokeless tobacco: Never Used     Comment: stopped 05/28/2014. Started again 4-5 cigs./day  . Alcohol use No     Allergies   Benazepril-hydrochlorothiazide; Chantix [varenicline]; Codeine; Methylprednisolone; and Statins   Review of Systems Review of Systems  Respiratory: Negative for shortness of breath.   Cardiovascular: Negative for chest pain.  Musculoskeletal: Positive for arthralgias (left foot).  Neurological: Negative for syncope, light-headedness and numbness.       -tingling      Physical Exam Updated Vital Signs BP (!) 201/87 (BP Location: Left Arm)   Pulse 96   Temp 98.5 F (36.9 C) (Oral)   Resp 18   Ht 5' 5.5" (1.664 m)   Wt 129 lb 5 oz (58.7 kg)   SpO2 100%   BMI 21.19 kg/m   Physical Exam  Constitutional: She appears well-developed and well-nourished. No distress.     HENT:  Head: Normocephalic and atraumatic.  Eyes: Conjunctivae are normal.  Neck: Neck supple.  Cardiovascular: Normal rate and regular rhythm.   No murmur heard. Pulmonary/Chest: Effort normal and breath sounds normal. No respiratory distress.  Abdominal: Soft. There is no tenderness.  Musculoskeletal: She exhibits tenderness. She exhibits no edema.  Left foot tenderness noted to dorsum of foot distally on palpation without  crepitus. Mild swelling noted. DP pulse palpable. Brisk cap refill. Left ankle with full ROM. Able to wiggle toes, but with increase in pain. Ambulatory.   Neurological: She is alert.  Skin: Skin is warm and dry.  Psychiatric: She has a normal mood and affect.  Nursing note and vitals reviewed.    ED Treatments / Results   DIAGNOSTIC STUDIES: Oxygen Saturation is 100% on RA, nomal by my interpretation.    COORDINATION OF CARE: 7:49 PM Discussed treatment plan with pt at bedside which includes foot XR and pt agreed to plan.   Labs (all labs ordered are listed, but only abnormal results are displayed) Labs Reviewed - No data to display  EKG  EKG Interpretation None       Radiology Dg Foot Complete Left  Result Date: 01/27/2016 CLINICAL DATA:  Fall with foot pain.  Initial encounter. EXAM: LEFT FOOT - COMPLETE 3+ VIEW COMPARISON:  None. FINDINGS: Oblique second metatarsal shaft fracture with mild lateral displacement. Nondisplaced third metatarsal neck fracture along the lateral cortex. Osteopenia. IMPRESSION: 1. Mildly displaced second metatarsal shaft fracture. 2. Nondisplaced third metatarsal neck fracture. Electronically Signed   By: Monte Fantasia M.D.   On: 01/27/2016 20:25    Procedures Procedures (including critical care time)  Medications Ordered in ED Medications - No data to display   Initial Impression / Assessment and Plan / ED Course  I have reviewed the triage vital signs and the nursing notes.  Pertinent labs & imaging results that were available during my care of the patient were reviewed by me and considered in my medical decision making (see chart for details).  Clinical Course    Patient X-Ray shows mildly displaced second metatarsal shaft fracture and nondisplaced third metatarsal neck fracture. Pt advised to follow up with orthopedics. Patient given CAM walker while in ED, conservative therapy recommended and discussed. Patient will be discharged  home & is agreeable with above plan. Returns precautions discussed. Pt appears safe for discharge.  Care discussed with Dr. Laverta Baltimore. Pt also found to have elevated BP.  No sxs to suggest hypertensive emergency.  Recommend having it recheck by PCP.     Final Clinical Impressions(s) / ED Diagnoses   Final diagnoses:  Nondisplaced fracture of third metatarsal bone, left foot, initial encounter for closed fracture  Displaced fracture of second metatarsal bone, left foot, initial encounter for closed fracture    New Prescriptions New Prescriptions   HYDROCODONE-ACETAMINOPHEN (NORCO/VICODIN) 5-325 MG TABLET    Take 1 tablet by mouth every 6 (six) hours as needed for moderate pain or severe pain.    I personally performed the services described in this documentation, which was scribed in my presence. The recorded information has been reviewed and is accurate.       Domenic Moras, PA-C 01/27/16 2100    Margette Fast, MD 01/29/16 0930

## 2016-01-30 ENCOUNTER — Ambulatory Visit (INDEPENDENT_AMBULATORY_CARE_PROVIDER_SITE_OTHER): Payer: Medicare Other | Admitting: Orthopaedic Surgery

## 2016-01-30 DIAGNOSIS — S93321A Subluxation of tarsometatarsal joint of right foot, initial encounter: Secondary | ICD-10-CM | POA: Diagnosis not present

## 2016-02-20 ENCOUNTER — Encounter (INDEPENDENT_AMBULATORY_CARE_PROVIDER_SITE_OTHER): Payer: Self-pay | Admitting: Orthopaedic Surgery

## 2016-02-20 ENCOUNTER — Ambulatory Visit (INDEPENDENT_AMBULATORY_CARE_PROVIDER_SITE_OTHER): Payer: Medicare Other | Admitting: Orthopaedic Surgery

## 2016-02-20 ENCOUNTER — Ambulatory Visit (INDEPENDENT_AMBULATORY_CARE_PROVIDER_SITE_OTHER): Payer: Medicare Other

## 2016-02-20 DIAGNOSIS — S93321D Subluxation of tarsometatarsal joint of right foot, subsequent encounter: Secondary | ICD-10-CM

## 2016-02-20 DIAGNOSIS — S93322D Subluxation of tarsometatarsal joint of left foot, subsequent encounter: Secondary | ICD-10-CM

## 2016-02-20 NOTE — Progress Notes (Signed)
Office Visit Note   Patient: Stephanie Gross           Date of Birth: 09-04-1945           MRN: [id number] Visit Date: 02/20/2016              Requested by: Velna Ochs, MD [address] [place], [place] [address] PCP: Velna Ochs, MD   Assessment & Plan: Visit Diagnoses:  1. Subluxation of tarsometatarsal joint of right foot, subsequent encounter     Plan: She is doing well. She requests a note to return to work wearing regular shoes and no restrictions. I will see her back for a final visit in 4 weeks with a repeat AP and oblique or 2 views of the left foot  Follow-Up Instructions: No Follow-up on file.   Orders:  Orders Placed This Encounter  Procedures  . XR Foot 2 Views Left   No orders of the defined types were placed in this encounter.     Procedures: No procedures performed   Clinical Data: No additional findings.   Subjective: Chief Complaint  Patient presents with  . Left Foot - Pain, Follow-up    DOI 01/27/16, LT Foot Fx, patient doing better--better ROM and strength    HPI  Review of Systems   Objective: Vital Signs: There were no vitals taken for this visit.  Physical Exam  Ortho Exam Her left foot is still slightly swollen but she has minimal pain to palpation Specialty Comments:  No specialty comments available.  Imaging: Xr Foot 2 Views Left  Result Date: 02/20/2016 2 views of her left foot show good alignment of the second metatarsal fracture and interval healing    PMFS History: Patient Active Problem List   Diagnosis Date Noted  . CKD (chronic kidney disease) stage 3, GFR 30-59 ml/min 06/27/2015  . Tobacco abuse   . CVA (cerebral vascular accident) ([place]) 02/07/2012  . Preventative health care 03/13/2011  . Hyperlipidemia associated with type 2 diabetes mellitus ([place]) 03/12/2006  . Resistant hypertension 03/12/2006  . CORONARY ARTERY DISEASE 03/12/2006  . DM (diabetes mellitus) type 2, uncontrolled, with  ketoacidosis ([place]) 03/12/1994   Past Medical History:  Diagnosis Date  . Arthritis   . AVD (aortic valve disease)   . CAD (coronary artery disease)    s/p RCA stent 2007. Myoview 2010 normal  . Chronic obstructive bronchitis without exacerbation ([place])   . 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 Degeneration of meniscus of knee   . Derangement of left knee   . Diabetes mellitus without complication ([place])   . DM (diabetes mellitus) ([place])   . DM (diabetes mellitus) type 2, uncontrolled, with ketoacidosis ([place]) 03/12/1994   HIGH RISK FEET.. Please have patient take shoes and socks off every visit for visual foot inspection.  NPDR OU CSDME on 09/2011  Qualifier: Diagnosis of  By: Tomasa Hosteller MD, Edmon Crape.    . GERD (gastroesophageal reflux disease)   . HTN (hypertension)   . Hyperlipidemia   . Hypertension   . Obesity   . Vitamin D deficiency     Family History  Problem Relation Age of Onset  . Stroke Mother   . Diabetes Mother   . Hypertension Mother   . Diabetes Maternal Grandmother   . Diabetes Sister     Past Surgical History:  Procedure Laterality Date  . ABDOMINAL HYSTERECTOMY    . CHOLECYSTECTOMY    .  CORONARY ANGIOPLASTY WITH STENT PLACEMENT    . KNEE SURGERY    . SHOULDER SURGERY     Social History   Occupational History  . unemployed    Social History Main Topics  . Smoking status: Former Smoker    Packs/day: 0.30    Years: 30.00    Types: Cigarettes  . Smokeless tobacco: Never Used     Comment: stopped 05/28/2014. Started again 4-5 cigs./day  . Alcohol use No  . Drug use: No  . Sexual activity: Not on file

## 2016-03-03 ENCOUNTER — Telehealth: Payer: Self-pay | Admitting: Pharmacist

## 2016-03-03 NOTE — Progress Notes (Signed)
Contacted patient for notification of [place] study regarding medication adherence. Patient verbalized understanding.

## 2016-03-04 ENCOUNTER — Encounter: Payer: Self-pay | Admitting: Internal Medicine

## 2016-03-19 ENCOUNTER — Ambulatory Visit (INDEPENDENT_AMBULATORY_CARE_PROVIDER_SITE_OTHER): Payer: Medicare Other | Admitting: Orthopaedic Surgery

## 2016-04-09 ENCOUNTER — Ambulatory Visit (INDEPENDENT_AMBULATORY_CARE_PROVIDER_SITE_OTHER): Payer: Medicare Other | Admitting: Orthopaedic Surgery

## 2016-04-11 ENCOUNTER — Telehealth: Payer: Self-pay | Admitting: Internal Medicine

## 2016-04-11 NOTE — Telephone Encounter (Signed)
APT. REMINDER CALL, LMTCB °

## 2016-04-14 ENCOUNTER — Encounter: Payer: Self-pay | Admitting: Internal Medicine

## 2016-05-07 ENCOUNTER — Ambulatory Visit (INDEPENDENT_AMBULATORY_CARE_PROVIDER_SITE_OTHER): Payer: Medicare Other

## 2016-05-07 ENCOUNTER — Ambulatory Visit (INDEPENDENT_AMBULATORY_CARE_PROVIDER_SITE_OTHER): Payer: Medicare Other | Admitting: Orthopaedic Surgery

## 2016-05-07 DIAGNOSIS — M79672 Pain in left foot: Secondary | ICD-10-CM | POA: Diagnosis not present

## 2016-05-07 NOTE — Progress Notes (Signed)
Patient is very pleasant 71 year old who is following up for a left foot injury. She has known second metatarsal fracture. She says it in her foot has no pain now just gets "tired". Otherwise she is doing well. She is diabetic but under good control.  On examination of her left foot there is no open wounds. She still has a little bit of swelling over the second metatarsal but it's overall well aligned and there is no pain. She has otherwise no significant findings.  X-rays of her left foot are reviewed today and show abundant callus formation around her second metatarsal fracture appears it is healed completely. The remainder of her foot x-rays appear normal.  At this point she'll continue increase her activities as she tolerates and she is artery back in a regular shoes. She'll follow-up as needed.

## 2016-06-01 NOTE — Assessment & Plan Note (Addendum)
Patient has been non compliant with her Lantus for the past few months. A1C today is 6.3. Given that her A1C has improved despite not being on lantus, I will opt to monitor off insulin for now. I am hopeful that minimizing her medication regimen will help with her compliance. She is asymptomatic today. -- Follow up in 3 months

## 2016-06-01 NOTE — Assessment & Plan Note (Addendum)
Patient was previously prescribed amlodipine 10 mg daily, lisinopril 40mg  daily, carvedilol 25mg  twice daily, and Imdur 30mg  daily. She stopped taking all of her medications a few months ago (except for the amlodipine) because she said she could no longer afford to fill her prescriptions. Today her blood pressure is uncontrolled, 188/79. She has a long standing history of medication non compliance documented in her chart. Patient does have insurance, and with the help of our clinic pharmacist, we personally called her pharmacy to verify that all of her prescriptions are free through [facility] mail order pharmacy. After informing patient of this, she then proceeded to tell me that it's just too hard for her to take the medication because she works long hours and it's inconvenient. Overall, she is very defensive and does not think she needs to take her medication. I discussed with her the risk of having a recurrent stroke if she continues to be non compliant and the importance of taking her medications. Patient is hesitant but agreeable to restarting her lisinopril and carvedilol at this time. I asked patient to please give our office a call if she has any problem filling the prescriptions or experiences any side effects. She expressed understanding and is in agreement with our plan.  -- Continue Amlodipine 10 mg daily -- Restart lisinopril 40 mg -- Restart Carvedilol 25 mg BID -- Provided scripts  -- Follow up in 1 months for BP recheck

## 2016-06-01 NOTE — Progress Notes (Signed)
   CC: HTN follow up   HPI:  [name] is a 72 y.o. female with pmhx of CAD, DM, HTN, HLD, tobacco abuse and CVA (October 2013) here for blood pressure follow up. Patient has been noncompliance with all of her medications except for her amlodipine 10 mg daily and ASA 81 daily. Patients says she feels great and doesn't understand why she needed to come in today. She self discontinued all of her other medications including her lantus a few months ago because she said she could no longer afford to fill them.    Past Medical History:  Diagnosis Date  . Arthritis   . AVD (aortic valve disease)   . CAD (coronary artery disease)    s/p RCA stent 2007. Myoview 2010 normal  . Chronic obstructive bronchitis without exacerbation ([place])   . 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 Degeneration of meniscus of knee   . Derangement of left knee   . Diabetes mellitus without complication ([place])   . DM (diabetes mellitus) ([place])   . DM (diabetes mellitus) type 2, uncontrolled, with ketoacidosis ([place]) 03/12/1994   HIGH RISK FEET.. Please have patient take shoes and socks off every visit for visual foot inspection.  NPDR OU CSDME on 09/2011  Qualifier: Diagnosis of  By: Tomasa Hosteller MD, Edmon Crape.    . GERD (gastroesophageal reflux disease)   . HTN (hypertension)   . Hyperlipidemia   . Hypertension   . Obesity   . Vitamin D deficiency     Review of Systems:  All pertinents listed in HPI, otherwise negative.   Physical Exam:  Vitals:   06/02/16 1546  BP: (!) 188/79  Pulse: 86  Temp: 98.1 F (36.7 C)  TempSrc: Oral  SpO2: 100%  Weight: 121 lb 8 oz (55.1 kg)  Height: 5' 5.5" (1.664 m)   Constitutional: NAD, appears comfortable Cardiovascular: RRR, no murmurs, rubs, or gallops.  Pulmonary/Chest: CTAB, no wheezes, rales, or rhonchi.  Abdominal: Soft, non tender, non distended. +BS.  Extremities: Warm and well perfused. Distal pulses  intact. No edema.  Neurological: A&Ox3, CN II - XII grossly intact.    Assessment & Plan:   See Encounters Tab for problem based charting.  Patient discussed with Dr. Dareen Piano

## 2016-06-02 ENCOUNTER — Ambulatory Visit (INDEPENDENT_AMBULATORY_CARE_PROVIDER_SITE_OTHER): Payer: Medicare Other | Admitting: Internal Medicine

## 2016-06-02 ENCOUNTER — Encounter: Payer: Self-pay | Admitting: Pharmacist

## 2016-06-02 ENCOUNTER — Encounter: Payer: Self-pay | Admitting: Internal Medicine

## 2016-06-02 VITALS — BP 188/79 | HR 86 | Temp 98.1°F | Ht 65.5 in | Wt 121.5 lb

## 2016-06-02 DIAGNOSIS — I639 Cerebral infarction, unspecified: Secondary | ICD-10-CM

## 2016-06-02 DIAGNOSIS — E784 Other hyperlipidemia: Secondary | ICD-10-CM | POA: Diagnosis not present

## 2016-06-02 DIAGNOSIS — Z9114 Patient's other noncompliance with medication regimen: Secondary | ICD-10-CM

## 2016-06-02 DIAGNOSIS — Z79899 Other long term (current) drug therapy: Secondary | ICD-10-CM

## 2016-06-02 DIAGNOSIS — I10 Essential (primary) hypertension: Secondary | ICD-10-CM | POA: Diagnosis not present

## 2016-06-02 DIAGNOSIS — Z8673 Personal history of transient ischemic attack (TIA), and cerebral infarction without residual deficits: Secondary | ICD-10-CM

## 2016-06-02 DIAGNOSIS — Z7982 Long term (current) use of aspirin: Secondary | ICD-10-CM

## 2016-06-02 DIAGNOSIS — F17210 Nicotine dependence, cigarettes, uncomplicated: Secondary | ICD-10-CM | POA: Diagnosis not present

## 2016-06-02 DIAGNOSIS — E1169 Type 2 diabetes mellitus with other specified complication: Secondary | ICD-10-CM | POA: Diagnosis not present

## 2016-06-02 DIAGNOSIS — E1110 Type 2 diabetes mellitus with ketoacidosis without coma: Secondary | ICD-10-CM

## 2016-06-02 DIAGNOSIS — Z794 Long term (current) use of insulin: Secondary | ICD-10-CM

## 2016-06-02 DIAGNOSIS — E785 Hyperlipidemia, unspecified: Secondary | ICD-10-CM

## 2016-06-02 LAB — POCT GLYCOSYLATED HEMOGLOBIN (HGB A1C): HEMOGLOBIN A1C: 6.3

## 2016-06-02 LAB — GLUCOSE, CAPILLARY: Glucose-Capillary: 105 mg/dL — ABNORMAL HIGH (ref 65–99)

## 2016-06-02 MED ORDER — ATORVASTATIN CALCIUM 80 MG PO TABS: 80.0000 mg | ORAL_TABLET | Freq: Every day | ORAL | 3 refills | Status: DC

## 2016-06-02 MED ORDER — AMLODIPINE BESYLATE 10 MG PO TABS: 10.0000 mg | ORAL_TABLET | Freq: Every day | ORAL | 3 refills | Status: DC

## 2016-06-02 MED ORDER — LISINOPRIL 40 MG PO TABS: 40.0000 mg | ORAL_TABLET | Freq: Every day | ORAL | 3 refills | Status: DC

## 2016-06-02 MED ORDER — CARVEDILOL 25 MG PO TABS: 25.0000 mg | ORAL_TABLET | Freq: Two times a day (BID) | ORAL | 3 refills | Status: DC

## 2016-06-02 MED ORDER — ASPIRIN EC 81 MG PO TBEC: 81.0000 mg | DELAYED_RELEASE_TABLET | Freq: Every day | ORAL | 3 refills | Status: AC

## 2016-06-02 NOTE — Patient Instructions (Addendum)
Stephanie Gross,  Please continue to take your amlodipine 10 mg daily and your aspirin 81 mg daily. I have restarted two of your old blood pressures medicines. Please take lisinopril 40 mg once a day, and carvedilol 25 mg twice a day. I have also restarted your lipid medicine, Lipitor, once a day. All of these medicines should be free through your pharmacy. It is very important that you take these medicines in order to prevent another stroke. As far as your diabetes, your A1C today was 6.3! This is great news. We do not need to restart your insulin. I would like to recheck this in 3 months to make sure you are still on the right track. It was great to see you today, I am glad you are feeling well. Please return in 1 month for follow up to see how your blood pressure is doing, or sooner if you need me. If you have any questions or concerns, call our clinic at [phone] or after hours call [phone] and ask for the internal medicine resident on call. Thank you!

## 2016-06-02 NOTE — Progress Notes (Signed)
Contacted [facility] for price check on amlodipine, atorvastatin, carvedilol, isosorbide mononitrate, and lisinopril. Per pharmacist, on 90-day mail order program, these medications would be $0 copay.

## 2016-06-03 DIAGNOSIS — Z91148 Patient's other noncompliance with medication regimen for other reason: Secondary | ICD-10-CM | POA: Insufficient documentation

## 2016-06-03 DIAGNOSIS — Z9114 Patient's other noncompliance with medication regimen: Secondary | ICD-10-CM | POA: Insufficient documentation

## 2016-06-03 NOTE — Assessment & Plan Note (Addendum)
Patient says she has been compliant with her ASA 81 daily. I applauded patient and encouraged her to continue this medication.  -- Continue ASA 81 daily  -- Provided refill

## 2016-06-03 NOTE — Assessment & Plan Note (Signed)
Patient has been non compliant with her statin due to "cost". I explained to her the importance of taking this medication in the setting of her prior CVA in order to prevent further infarcts. Verified with pharmacy that her prescription is free. Patient is agreeable to restarting her medicaiton.  -- Restart Lipitor 80 mg daily

## 2016-06-04 NOTE — Progress Notes (Signed)
Internal Medicine Clinic Attending  Case discussed with Dr. Guilloud at the time of the visit.  We reviewed the resident's history and exam and pertinent patient test results.  I agree with the assessment, diagnosis, and plan of care documented in the resident's note.  

## 2016-06-06 MED ORDER — LISINOPRIL 40 MG PO TABS: 40.0000 mg | ORAL_TABLET | Freq: Every day | ORAL | 3 refills | Status: DC

## 2016-06-06 MED ORDER — CARVEDILOL 25 MG PO TABS: 25.0000 mg | ORAL_TABLET | Freq: Two times a day (BID) | ORAL | 3 refills | Status: DC

## 2016-06-06 NOTE — Addendum Note (Signed)
Addended by: Forde Dandy on: 06/06/2016 04:12 PM   Modules accepted: Orders

## 2016-06-09 ENCOUNTER — Ambulatory Visit: Payer: Self-pay | Admitting: Pharmacist

## 2016-07-10 ENCOUNTER — Encounter: Payer: Self-pay | Admitting: Internal Medicine

## 2016-08-04 ENCOUNTER — Encounter: Payer: Self-pay | Admitting: Internal Medicine

## 2016-08-11 ENCOUNTER — Ambulatory Visit: Payer: Self-pay

## 2016-08-12 ENCOUNTER — Encounter: Payer: Self-pay | Admitting: Internal Medicine

## 2016-12-05 ENCOUNTER — Ambulatory Visit (INDEPENDENT_AMBULATORY_CARE_PROVIDER_SITE_OTHER): Payer: Medicare Other | Admitting: Cardiovascular Disease

## 2016-12-05 ENCOUNTER — Encounter: Payer: Self-pay | Admitting: Cardiovascular Disease

## 2016-12-05 VITALS — BP 164/60 | HR 76 | Ht 65.5 in | Wt 113.0 lb

## 2016-12-05 DIAGNOSIS — E7800 Pure hypercholesterolemia, unspecified: Secondary | ICD-10-CM

## 2016-12-05 DIAGNOSIS — I25118 Atherosclerotic heart disease of native coronary artery with other forms of angina pectoris: Secondary | ICD-10-CM

## 2016-12-05 DIAGNOSIS — I10 Essential (primary) hypertension: Secondary | ICD-10-CM

## 2016-12-05 DIAGNOSIS — Z720 Tobacco use: Secondary | ICD-10-CM | POA: Diagnosis not present

## 2016-12-05 MED ORDER — CARVEDILOL 25 MG PO TABS: 25.0000 mg | ORAL_TABLET | Freq: Two times a day (BID) | ORAL | 3 refills | Status: DC

## 2016-12-05 NOTE — Progress Notes (Signed)
Chief Complaint  Patient presents with  . Follow-up    CAD    History of Present Illness:  71 yo female with history of CAD, DM, HTN, HLD, tobacco abuse, CVA here today for cardiac follow up. She was previously followed by Dr. [name] and then Dr. Haroldine Laws. She has a history of coronary artery disease and had a Taxus drug eluting stent placed in the RCA in January 2007. Most recent cardiac catheterization was January 2008, which showed minimal nonobstructive coronary artery disease in the major epicardial vessels with patent RCA stent and a 90% stenosis in the midsection of the small PDA. This was treated medically. She had a stress myoview in 11/10 for atypical chest pain that showed no ischemia, LVEF 70%. Normal ABI 2010. Stroke in October 2013. She was seen in 2015 and had heme positive stool and found to have a gastric ulcer and esophagitis which resolved on a PPI. Last echo August 2016 with normal LV systolic function, mild AI. She has continued to smoke. She has had trouble tolerating statins in the past but is now tolerating Lipitor.   She is here today for follow up. The patient denies any chest pain, dyspnea, palpitations, lower extremity edema, orthopnea, PND, dizziness, near syncope or syncope. She has stopped taking Coreg, Lisinopril and Lipitor as she thinks this caused constipation. She is still smoking. She lost her husband 4 months ago. She is angry at [facility] because of this but gave me no details. She has lost 8 lbs in the last 6 months.   Primary Care Physician: Velna Ochs, MD  Past Medical History:  Diagnosis Date  . Arthritis   . AVD (aortic valve disease)   . CAD (coronary artery disease)    s/p RCA stent 2007. Myoview 2010 normal  . Chronic obstructive bronchitis without exacerbation ([place])   . 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 Degeneration of meniscus of knee   . Derangement of left knee   .  Diabetes mellitus without complication ([place])   . DM (diabetes mellitus) ([place])   . DM (diabetes mellitus) type 2, uncontrolled, with ketoacidosis ([place]) 03/12/1994   HIGH RISK FEET.. Please have patient take shoes and socks off every visit for visual foot inspection.  NPDR [facility] CSDME on 09/2011  Qualifier: Diagnosis of  By: Tomasa Hosteller MD, Edmon Crape.    . GERD (gastroesophageal reflux disease)   . HTN (hypertension)   . Hyperlipidemia   . Hypertension   . Obesity   . Vitamin D deficiency     Past Surgical History:  Procedure Laterality Date  . ABDOMINAL HYSTERECTOMY    . CHOLECYSTECTOMY    . CORONARY ANGIOPLASTY WITH STENT PLACEMENT    . KNEE SURGERY    . SHOULDER SURGERY      Current Outpatient Prescriptions  Medication Sig Dispense Refill  . amLODipine (NORVASC) 10 MG tablet Take 1 tablet (10 mg total) by mouth daily. 90 tablet 3  . aspirin EC 81 MG tablet Take 1 tablet (81 mg total) by mouth daily. 90 tablet 3   No current facility-administered medications for this visit.     Allergies  Allergen Reactions  . Benazepril-Hydrochlorothiazide Other (See Comments)    UNKNOWN. Ok with lisinopril  . Chantix [Varenicline]     SUICIDAL IDEATION  . Codeine Nausea And Vomiting  . Methylprednisolone Other (See Comments)    UNKNOWN  . Statins Other (See Comments)    "walking  into a hole", heavy legs, feels like she is going to fall    Social History   Social History  . Marital status: Single    Spouse name: N/A  . Number of children: N/A  . Years of education: N/A   Occupational History  . unemployed    Social History Main Topics  . Smoking status: Current Every Day Smoker    Packs/day: 0.50    Years: 30.00    Types: Cigarettes  . Smokeless tobacco: Never Used     Comment: . Started again 4-5 cigs./day  . Alcohol use No  . Drug use: No  . Sexual activity: Not on file   Other Topics Concern  . Not on file   Social History Narrative   ** Merged History Encounter **         Patient recently lost her job and is currently without insurance. - Nov 2012    Family History  Problem Relation Age of Onset  . Stroke Mother   . Diabetes Mother   . Hypertension Mother   . Diabetes Maternal Grandmother   . Diabetes Sister     Review of Systems:  As stated in the HPI and otherwise negative.   BP (!) 164/60   Pulse 76   Ht 5' 5.5" (1.664 m)   Wt 113 lb (51.3 kg)   SpO2 99%   BMI 18.52 kg/m   Physical Examination: General: Well developed, well nourished, NAD  HEENT: OP clear, mucus membranes moist  SKIN: warm, dry. No rashes. Neuro: No focal deficits  Musculoskeletal: Muscle strength 5/5 all ext  Psychiatric: Mood and affect normal  Neck: No JVD, no carotid bruits, no thyromegaly, no lymphadenopathy.  Lungs:Clear bilaterally, no wheezes, rhonci, crackles Cardiovascular: Regular rate and rhythm. No murmurs, gallops or rubs. Abdomen:Soft. Bowel sounds present. Non-tender.  Extremities: No lower extremity edema. Pulses are 2 + in the bilateral DP/PT.  Echo 12/09/14: Left ventricle: The cavity size was normal. Wall thickness was  increased in a pattern of mild LVH. Systolic function was  vigorous. The estimated ejection fraction was in the range of 65%  to 70%. Wall motion was normal; there were no regional wall  motion abnormalities. Doppler parameters are consistent with  abnormal left ventricular relaxation (grade 1 diastolic  dysfunction). - Aortic valve: There was mild regurgitation. - Left atrium: The atrium was mildly dilated. - Right atrium: The atrium was mildly dilated. - Pericardium, extracardiac: A trivial pericardial effusion was  identified.  EKG:  EKG is ordered today. The ekg ordered today demonstrates  NSR, rate 67 bpm.   Recent Labs: No results found for requested labs within last 8760 hours.   Lipid Panel    Component Value Date/Time   CHOL 207 (H) 05/21/2015 1550   TRIG 160 (H) 05/21/2015 1550   HDL 55  05/21/2015 1550   CHOLHDL 3.8 05/21/2015 1550   CHOLHDL 4.8 12/09/2014 0612   VLDL 27 12/09/2014 0612   LDLCALC 120 (H) 05/21/2015 1550   LDLDIRECT 111 (H) 09/15/2008 2047     Wt Readings from Last 3 Encounters:  12/05/16 113 lb (51.3 kg)  06/02/16 121 lb 8 oz (55.1 kg)  01/27/16 129 lb 5 oz (58.7 kg)     Other studies Reviewed: Additional studies/ records that were reviewed today include:  Review of the above records demonstrates:    Assessment and Plan:   1. CAD with angina: No chest pain suggestive of unstable angina on Norvasc. She has continued taking  ASA but stopped Coreg, Lipitor and Lisinopril due to constipation. She is willing to restart Coreg but not Lipitor or Lisinopril.   2. Hypertension: Followed in primary care. BP controlled elevated off of meds. Will restart Coreg.      3. Hyperlipidemia: She tolerates high dose statins poorly. We have discussed Praluent or Repatha injections but she has refused.  She refuses to take a statin.   4. Tobacco Abuse:Smoking cessation advised. She does not wish to stop.   6. Prior CVA:She is on ASA.   Current medicines are reviewed at length with the patient today.  The patient does not have concerns regarding medicines.  The following changes have been made:  no change  Labs/ tests ordered today include:   No orders of the defined types were placed in this encounter.   Disposition:   FU with me in 6  months   Signed, Lauree Chandler, MD 12/05/2016 4:08 PM    [facility] [address], [place], [place]  [address] Phone: [phone]; Fax: [phone]

## 2016-12-05 NOTE — Patient Instructions (Signed)
Medication Instructions:  Your physician has recommended you make the following change in your medication:  Resume Coreg 25 mg by mouth twice daily   Labwork: none  Testing/Procedures: none  Follow-Up: Your physician recommends that you schedule a follow-up appointment in: 6 months. Please call our office in about 3 months to schedule this appointment.    Any Other Special Instructions Will Be Listed Below (If Applicable).     If you need a refill on your cardiac medications before your next appointment, please call your pharmacy.

## 2017-03-27 ENCOUNTER — Telehealth: Payer: Self-pay | Admitting: Cardiovascular Disease

## 2017-03-27 NOTE — Telephone Encounter (Signed)
New Message  Pt call requesting to speak with RN to see if Dr. Arline Asp from [facility] [address] called for a clearance for her for surgery tomorrow,. Please call back to discuss

## 2017-03-27 NOTE — Telephone Encounter (Signed)
Returned pts cal, left a message for her to call back.

## 2017-03-27 NOTE — Telephone Encounter (Signed)
New message  Dr Arline Asp office about dental clearance .  Per Anderson Malta, Dr Arline Asp spoke with Dr Angelena Form yesterday. States Dr Angelena Form wanted to know what type of sedation will be used. Please call Anderson Malta [phone].  1. What dental office are you calling from? Dr Arline Asp  2. What is your office phone and fax number? Phone  [phone], fax # [phone]  3. What type of procedure is the patient having performed? Sedation, 13 extractions   What date is procedure scheduled or is the patient there now? 11/30  4. What is your question (ex. Antibiotics prior to procedure, holding medication-we need to know how long dentist wants pt to hold med)? n/a

## 2017-03-27 NOTE — Telephone Encounter (Signed)
Pt returned my call and has been advised that we haven't received anything in regards to her for preoperative clearance.  Pt verbalized understanding.

## 2017-03-27 NOTE — Telephone Encounter (Signed)
See note below. Per Dr. Angelena Form, patient can hold aspirin and proceed with planned procedure. Patient made aware. Will route this bundled recommendation to requesting provider via Epic fax function. Please call with questions.  Charlie Pitter, PA-C  03/27/2017, 4:13 PM

## 2017-03-27 NOTE — Telephone Encounter (Signed)
Called Dr. Milagros Evener office back to find out what type of sedation would be use for the pts extractions. Had to leave a message on the voicemail.

## 2017-03-27 NOTE — Telephone Encounter (Signed)
She can hold ASA and proceed with planned procedure.   Stephanie Gross

## 2017-03-27 NOTE — Telephone Encounter (Signed)
    Chart reviewed as part of pre-operative protocol coverage.  Phone note from today states ""Dr Arline Asp office about dental clearance .  Per Anderson Malta, Dr Arline Asp spoke with Dr Angelena Form yesterday. States Dr Angelena Form wanted to know what type of sedation will be used."  Still awaiting type of sedation needed, but will forward to Dr. Angelena Form to get more information on the conversation that was had per this phone note  Pt is on ASA with known history of CAD with last cath 2008 with 90% midsection of small PDA, patent prior stents, recommendation for medical therapy. Last OV 12/05/16 without angina.  Dr. Angelena Form, please route your reply to P CV DIV PREOP.  Charlie Pitter, PA-C  03/27/2017, 2:01 PM

## 2017-06-26 ENCOUNTER — Ambulatory Visit: Payer: Self-pay | Admitting: Cardiovascular Disease

## 2017-07-20 ENCOUNTER — Emergency Department (HOSPITAL_COMMUNITY)
Admission: EM | Admit: 2017-07-20 | Discharge: 2017-07-21 | Discharge status: Against Medical Advice | Payer: Medicare Other | Source: Home / Self Care | Attending: Emergency Medicine | Admitting: Emergency Medicine

## 2017-07-20 ENCOUNTER — Encounter (HOSPITAL_COMMUNITY): Payer: Self-pay | Admitting: Emergency Medicine

## 2017-07-20 ENCOUNTER — Telehealth: Payer: Self-pay | Admitting: Cardiovascular Disease

## 2017-07-20 DIAGNOSIS — Z823 Family history of stroke: Secondary | ICD-10-CM | POA: Diagnosis not present

## 2017-07-20 DIAGNOSIS — Z888 Allergy status to other drugs, medicaments and biological substances status: Secondary | ICD-10-CM | POA: Diagnosis not present

## 2017-07-20 DIAGNOSIS — E876 Hypokalemia: Secondary | ICD-10-CM | POA: Insufficient documentation

## 2017-07-20 DIAGNOSIS — I2510 Atherosclerotic heart disease of native coronary artery without angina pectoris: Secondary | ICD-10-CM | POA: Diagnosis not present

## 2017-07-20 DIAGNOSIS — Z8711 Personal history of peptic ulcer disease: Secondary | ICD-10-CM | POA: Diagnosis not present

## 2017-07-20 DIAGNOSIS — I161 Hypertensive emergency: Secondary | ICD-10-CM | POA: Insufficient documentation

## 2017-07-20 DIAGNOSIS — I10 Essential (primary) hypertension: Secondary | ICD-10-CM | POA: Diagnosis not present

## 2017-07-20 DIAGNOSIS — E785 Hyperlipidemia, unspecified: Secondary | ICD-10-CM | POA: Diagnosis not present

## 2017-07-20 DIAGNOSIS — Z9114 Patient's other noncompliance with medication regimen: Secondary | ICD-10-CM | POA: Diagnosis not present

## 2017-07-20 DIAGNOSIS — N184 Chronic kidney disease, stage 4 (severe): Secondary | ICD-10-CM | POA: Diagnosis not present

## 2017-07-20 DIAGNOSIS — N189 Chronic kidney disease, unspecified: Secondary | ICD-10-CM

## 2017-07-20 DIAGNOSIS — E440 Moderate protein-calorie malnutrition: Secondary | ICD-10-CM | POA: Diagnosis not present

## 2017-07-20 DIAGNOSIS — Z8673 Personal history of transient ischemic attack (TIA), and cerebral infarction without residual deficits: Secondary | ICD-10-CM | POA: Diagnosis not present

## 2017-07-20 DIAGNOSIS — R634 Abnormal weight loss: Secondary | ICD-10-CM | POA: Insufficient documentation

## 2017-07-20 DIAGNOSIS — N179 Acute kidney failure, unspecified: Secondary | ICD-10-CM

## 2017-07-20 DIAGNOSIS — Z955 Presence of coronary angioplasty implant and graft: Secondary | ICD-10-CM | POA: Diagnosis not present

## 2017-07-20 DIAGNOSIS — R748 Abnormal levels of other serum enzymes: Secondary | ICD-10-CM | POA: Diagnosis not present

## 2017-07-20 DIAGNOSIS — E119 Type 2 diabetes mellitus without complications: Secondary | ICD-10-CM | POA: Insufficient documentation

## 2017-07-20 DIAGNOSIS — Z9049 Acquired absence of other specified parts of digestive tract: Secondary | ICD-10-CM | POA: Diagnosis not present

## 2017-07-20 DIAGNOSIS — R110 Nausea: Secondary | ICD-10-CM

## 2017-07-20 DIAGNOSIS — N183 Chronic kidney disease, stage 3 (moderate): Secondary | ICD-10-CM

## 2017-07-20 DIAGNOSIS — D631 Anemia in chronic kidney disease: Secondary | ICD-10-CM | POA: Diagnosis not present

## 2017-07-20 DIAGNOSIS — Z7982 Long term (current) use of aspirin: Secondary | ICD-10-CM

## 2017-07-20 DIAGNOSIS — I160 Hypertensive urgency: Secondary | ICD-10-CM | POA: Diagnosis not present

## 2017-07-20 DIAGNOSIS — I313 Pericardial effusion (noninflammatory): Secondary | ICD-10-CM | POA: Diagnosis not present

## 2017-07-20 DIAGNOSIS — Z5320 Procedure and treatment not carried out because of patient's decision for unspecified reasons: Secondary | ICD-10-CM | POA: Insufficient documentation

## 2017-07-20 DIAGNOSIS — I129 Hypertensive chronic kidney disease with stage 1 through stage 4 chronic kidney disease, or unspecified chronic kidney disease: Secondary | ICD-10-CM | POA: Diagnosis not present

## 2017-07-20 DIAGNOSIS — F17210 Nicotine dependence, cigarettes, uncomplicated: Secondary | ICD-10-CM | POA: Insufficient documentation

## 2017-07-20 DIAGNOSIS — R1310 Dysphagia, unspecified: Secondary | ICD-10-CM | POA: Diagnosis not present

## 2017-07-20 DIAGNOSIS — Z833 Family history of diabetes mellitus: Secondary | ICD-10-CM | POA: Diagnosis not present

## 2017-07-20 DIAGNOSIS — Z885 Allergy status to narcotic agent status: Secondary | ICD-10-CM | POA: Diagnosis not present

## 2017-07-20 LAB — COMPREHENSIVE METABOLIC PANEL
ALBUMIN: 3.2 g/dL — AB (ref 3.5–5.0)
ALT: 6 U/L — ABNORMAL LOW (ref 14–54)
AST: 11 U/L — AB (ref 15–41)
Alkaline Phosphatase: 208 U/L — ABNORMAL HIGH (ref 38–126)
Anion gap: 9 (ref 5–15)
BILIRUBIN TOTAL: 0.1 mg/dL — AB (ref 0.3–1.2)
BUN: 28 mg/dL — ABNORMAL HIGH (ref 6–20)
CHLORIDE: 114 mmol/L — AB (ref 101–111)
CO2: 18 mmol/L — ABNORMAL LOW (ref 22–32)
Calcium: 8.1 mg/dL — ABNORMAL LOW (ref 8.9–10.3)
Creatinine, Ser: 3.73 mg/dL — ABNORMAL HIGH (ref 0.44–1.00)
GFR calc Af Amer: 13 mL/min — ABNORMAL LOW (ref >60)
GFR calc non Af Amer: 11 mL/min — ABNORMAL LOW (ref >60)
GLUCOSE: 78 mg/dL (ref 65–99)
POTASSIUM: 2.8 mmol/L — AB (ref 3.5–5.1)
Sodium: 141 mmol/L (ref 135–145)
TOTAL PROTEIN: 6.7 g/dL (ref 6.5–8.1)

## 2017-07-20 LAB — CBC WITH DIFFERENTIAL/PLATELET
BASOS ABS: 0 10*3/uL (ref 0.0–0.1)
Basophils Relative: 1 %
Eosinophils Absolute: 0.2 10*3/uL (ref 0.0–0.7)
Eosinophils Relative: 3 %
HEMATOCRIT: 26.3 % — AB (ref 36.0–46.0)
Hemoglobin: 8.4 g/dL — ABNORMAL LOW (ref 12.0–15.0)
LYMPHS ABS: 1.9 10*3/uL (ref 0.7–4.0)
LYMPHS PCT: 34 %
MCH: 28.4 pg (ref 26.0–34.0)
MCHC: 31.9 g/dL (ref 30.0–36.0)
MCV: 88.9 fL (ref 78.0–100.0)
MONO ABS: 0.4 10*3/uL (ref 0.1–1.0)
Monocytes Relative: 7 %
NEUTROS ABS: 3.2 10*3/uL (ref 1.7–7.7)
Neutrophils Relative %: 55 %
Platelets: 326 10*3/uL (ref 150–400)
RBC: 2.96 MIL/uL — AB (ref 3.87–5.11)
RDW: 14.9 % (ref 11.5–15.5)
WBC: 5.7 10*3/uL (ref 4.0–10.5)

## 2017-07-20 MED ORDER — ONDANSETRON 4 MG PO TBDP
4.0000 mg | ORAL_TABLET | Freq: Once | ORAL | Status: AC
  Administered 2017-07-20: 4 mg via ORAL
  Filled 2017-07-20: qty 1

## 2017-07-20 MED ORDER — AMLODIPINE BESYLATE 5 MG PO TABS
5.0000 mg | ORAL_TABLET | Freq: Once | ORAL | Status: AC
  Administered 2017-07-20: 5 mg via ORAL
  Filled 2017-07-20: qty 1

## 2017-07-20 NOTE — ED Provider Notes (Signed)
[facility] DEPT Provider Note   CSN: [id number] Arrival date & time: 07/20/17  1813   History   Chief Complaint Chief Complaint  Patient presents with  . Nausea    HPI Stephanie Gross is a 72 y.o. female who presents with nausea. PMH significant for CAD, Type 2 DM, HTN, HLD, GERD, hx of CVA. She states that her husband died last 08-Aug-2022. Since then she has had constant nausea. She feels like food may be getting stuck in her throat but she is able to swallow. She has not been vomiting but sometimes makes herself vomit to feel better. She has had about 30-40 pounds of unintentional weight loss due to not eating. She has been having non-bloody diarrhea as well. She is an occasional smoker. She has been off her medicines because her cardiologist took her off of them. She has not seen a primary doctor for this issue because she states she does not want to see [facility] providers because her husband died at [facility]. She denies fevers, sweats, chest pain, SOB, significant cough, abdominal pain, vomiting, constipation, urinary symptoms. She denies feelings of depression or anxiety.  HPI  Past Medical History:  Diagnosis Date  . Arthritis   . AVD (aortic valve disease)   . CAD (coronary artery disease)    s/p RCA stent 08/07/05. Myoview 08-07-08 normal  . Chronic obstructive bronchitis without exacerbation ([place])   . 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 Degeneration of meniscus of knee   . Derangement of left knee   . Diabetes mellitus without complication ([place])   . DM (diabetes mellitus) ([place])   . DM (diabetes mellitus) type 2, uncontrolled, with ketoacidosis ([place]) 03/12/1994   HIGH RISK FEET.. Please have patient take shoes and socks off every visit for visual foot inspection.  NPDR OU CSDME on 09/2011  Qualifier: Diagnosis of  By: Tomasa Hosteller MD, Edmon Crape.    . GERD (gastroesophageal reflux disease)   . HTN (hypertension)    . Hyperlipidemia   . Hypertension   . Obesity   . Vitamin D deficiency     Patient Active Problem List   Diagnosis Date Noted  . Noncompliance with medications 06/03/2016  . CKD (chronic kidney disease) stage 3, GFR 30-59 ml/min ([facility]) 06/27/2015  . Tobacco abuse   . CVA (cerebral vascular accident) ([place]) 02/07/2012  . Preventative health care 03/13/2011  . Hyperlipidemia associated with type 2 diabetes mellitus ([place]) 03/12/2006  . Uncontrolled hypertension 03/12/2006  . CORONARY ARTERY DISEASE 03/12/2006  . DM (diabetes mellitus) type 2, uncontrolled, with ketoacidosis ([place]) 03/12/1994    Past Surgical History:  Procedure Laterality Date  . ABDOMINAL HYSTERECTOMY    . CHOLECYSTECTOMY    . CORONARY ANGIOPLASTY WITH STENT PLACEMENT    . KNEE SURGERY    . SHOULDER SURGERY       OB History   None      Home Medications    Prior to Admission medications   Medication Sig Start Date End Date Taking? Authorizing Provider  aspirin EC 81 MG tablet Take 1 tablet (81 mg total) by mouth daily. 06/02/16  Yes Velna Ochs, MD  amLODipine (NORVASC) 10 MG tablet Take 1 tablet (10 mg total) by mouth daily. 06/02/16 06/02/17  Velna Ochs, MD  carvedilol (COREG) 25 MG tablet Take 1 tablet (25 mg total) by mouth 2 (two) times daily. 12/05/16 03/05/17  Burnell Blanks, MD    Family History  Family History  Problem Relation Age of Onset  . Stroke Mother   . Diabetes Mother   . Hypertension Mother   . Diabetes Maternal Grandmother   . Diabetes Sister     Social History Social History   Tobacco Use  . Smoking status: Current Every Day Smoker    Packs/day: 0.50    Years: 30.00    Pack years: 15.00    Types: Cigarettes  . Smokeless tobacco: Never Used  . Tobacco comment: . Started again 4-5 cigs./day  Substance Use Topics  . Alcohol use: No    Alcohol/week: 0.0 oz  . Drug use: No     Allergies   Benazepril-hydrochlorothiazide; Chantix [varenicline]; Codeine;  Methylprednisolone; and Statins   Review of Systems Review of Systems  Constitutional: Positive for fatigue and unexpected weight change. Negative for appetite change, diaphoresis and fever.  Respiratory: Negative for shortness of breath.   Cardiovascular: Negative for chest pain.  Gastrointestinal: Positive for diarrhea and nausea. Negative for abdominal pain, constipation and vomiting.  Genitourinary: Negative for dysuria and flank pain.  Neurological: Negative for headaches.  Psychiatric/Behavioral: Negative for dysphoric mood and suicidal ideas.  All other systems reviewed and are negative.    Physical Exam Updated Vital Signs BP (!) 210/91 (BP Location: Right Arm)   Pulse 80   Temp 98.5 F (36.9 C) (Oral)   Resp 16   SpO2 100%   Physical Exam  Constitutional: She is oriented to person, place, and time. She appears well-developed and well-nourished. No distress.  Calm, cooperative  HENT:  Head: Normocephalic and atraumatic.  Eyes: Pupils are equal, round, and reactive to light. Conjunctivae are normal. Right eye exhibits no discharge. Left eye exhibits no discharge. No scleral icterus.  Neck: Normal range of motion.  Cardiovascular: Normal rate and regular rhythm.  Pulmonary/Chest: Effort normal and breath sounds normal. No respiratory distress.  Abdominal: Soft. Bowel sounds are normal. She exhibits no distension. There is no tenderness.  Multiple surgical scars  Neurological: She is alert and oriented to person, place, and time.  Skin: Skin is warm and dry.  Psychiatric: She has a normal mood and affect. Her behavior is normal.  Nursing note and vitals reviewed.    ED Treatments / Results  Labs (all labs ordered are listed, but only abnormal results are displayed) Labs Reviewed  COMPREHENSIVE METABOLIC PANEL - Abnormal; Notable for the following components:      Result Value   Potassium 2.8 (*)    Chloride 114 (*)    CO2 18 (*)    BUN 28 (*)    Creatinine,  Ser 3.73 (*)    Calcium 8.1 (*)    Albumin 3.2 (*)    AST 11 (*)    ALT 6 (*)    Alkaline Phosphatase 208 (*)    Total Bilirubin 0.1 (*)    GFR calc non Af Amer 11 (*)    GFR calc Af Amer 13 (*)    All other components within normal limits  CBC WITH DIFFERENTIAL/PLATELET - Abnormal; Notable for the following components:   RBC 2.96 (*)    Hemoglobin 8.4 (*)    HCT 26.3 (*)    All other components within normal limits    EKG None  Radiology No results found.  Procedures Procedures (including critical care time)  Medications Ordered in ED Medications  amLODipine (NORVASC) tablet 5 mg (5 mg Oral Given 07/20/17 2239)  ondansetron (ZOFRAN-ODT) disintegrating tablet 4 mg (4 mg Oral Given 07/20/17  2239)     Initial Impression / Assessment and Plan / ED Course  I have reviewed the triage vital signs and the nursing notes.  Pertinent labs & imaging results that were available during my care of the patient were reviewed by me and considered in my medical decision making (see chart for details).  72 year old female presents with 1 year of nausea and unintentional weight loss. Her blood pressure is significantly elevated today, systolic is consistently over 200s. She states that she was taken off of her BP meds by her cardiologist. Her exam is overall unremarkable. Since her symptoms have been persistent for so long and are somewhat vague, will obtain CBC, CMP and EKG. She will be given a dose of her Amlodipine and Zofran.  Nursing staff notified me that the patient wants to leave. On review of labs, her CBC is remarkable for anemia which is slightly worse than baseline. Her CMP is remarkable for hypokalemia (2.8), low CO2 (18), elevated BUN/SCr from baseline (28/3.73), hypocalcemia (8.1), elevated AP (208), low albumin (3.2). Discussed with Dr. Kathrynn Humble who also saw the patient. Recommended admission for hypertensive emergency due to persistently high blood pressure and evidence of AKI when  compared to prior labs. The patient has refused. She was advised to restart her blood pressure medicine and was given a script for Zofran. She was also advised to follow up with cardiology and PCP.  We discussed the nature and purpose, risks and benefits, as well as, the alternatives of treatment. Time was given to allow the opportunity to ask questions and consider their options, and after the discussion, the patient decided to refuse the offerred treatment. The patient was informed that refusal could lead to, but was not limited to, death, permanent disability, or severe pain. If present, I asked the relatives or significant others to dissuade them without success. Prior to refusing, I determined that the patient had the capacity to make their decision and understood the consequences of that decision. After refusal, I made every reasonable opportunity to treat them to the best of my ability.  The patient was notified that they may return to the emergency department at any time for further treatment.      Final Clinical Impressions(s) / ED Diagnoses   Final diagnoses:  Hypertensive emergency  Nausea  Unintentional weight loss  Hypokalemia  Acute kidney injury superimposed on chronic kidney disease [facility])    ED Discharge Orders    None       Recardo Evangelist, PA-C 07/21/17 Venancio Poisson, MD 07/21/17 [phone]

## 2017-07-20 NOTE — Telephone Encounter (Signed)
New Message   Pt states that she cant eat, has been loosing a lot of weight, and doesn't know what's going on. Please call

## 2017-07-20 NOTE — ED Triage Notes (Addendum)
Patient c/o nausea and poor appetite x1 year. Reports loosing 15 pounds in the past year. Denies V/D. Reports she does not have a PCP. Ambulatory. Hx hypertension but states she does not have BP medications.

## 2017-07-20 NOTE — Telephone Encounter (Signed)
I spoke with pt and advised her to follow up with primary care for this. She states she no longer follows with primary care doctor she used to see.  I told her she could go to Urgent Care for evaluation and that some Urgent Care offices also did primary care.  I suggested she could also contact previous primary care provider for recommendations regarding new provider.

## 2017-07-21 ENCOUNTER — Encounter (HOSPITAL_COMMUNITY): Payer: Self-pay

## 2017-07-21 ENCOUNTER — Inpatient Hospital Stay (HOSPITAL_COMMUNITY): Payer: Medicare Other

## 2017-07-21 ENCOUNTER — Other Ambulatory Visit: Payer: Self-pay

## 2017-07-21 ENCOUNTER — Inpatient Hospital Stay (HOSPITAL_COMMUNITY)
Admission: EM | Admit: 2017-07-21 | Discharge: 2017-07-25 | DRG: 683 | Disposition: A | Discharge status: Home / Self Care | Payer: Medicare Other | Attending: Internal Medicine | Admitting: Internal Medicine

## 2017-07-21 DIAGNOSIS — Z833 Family history of diabetes mellitus: Secondary | ICD-10-CM | POA: Diagnosis not present

## 2017-07-21 DIAGNOSIS — K5730 Diverticulosis of large intestine without perforation or abscess without bleeding: Secondary | ICD-10-CM | POA: Diagnosis not present

## 2017-07-21 DIAGNOSIS — I10 Essential (primary) hypertension: Secondary | ICD-10-CM

## 2017-07-21 DIAGNOSIS — N184 Chronic kidney disease, stage 4 (severe): Secondary | ICD-10-CM | POA: Diagnosis present

## 2017-07-21 DIAGNOSIS — F17210 Nicotine dependence, cigarettes, uncomplicated: Secondary | ICD-10-CM | POA: Diagnosis present

## 2017-07-21 DIAGNOSIS — R112 Nausea with vomiting, unspecified: Secondary | ICD-10-CM | POA: Diagnosis not present

## 2017-07-21 DIAGNOSIS — Z8711 Personal history of peptic ulcer disease: Secondary | ICD-10-CM | POA: Diagnosis not present

## 2017-07-21 DIAGNOSIS — N179 Acute kidney failure, unspecified: Principal | ICD-10-CM | POA: Diagnosis present

## 2017-07-21 DIAGNOSIS — E440 Moderate protein-calorie malnutrition: Secondary | ICD-10-CM | POA: Diagnosis not present

## 2017-07-21 DIAGNOSIS — J449 Chronic obstructive pulmonary disease, unspecified: Secondary | ICD-10-CM | POA: Diagnosis present

## 2017-07-21 DIAGNOSIS — Z885 Allergy status to narcotic agent status: Secondary | ICD-10-CM

## 2017-07-21 DIAGNOSIS — N170 Acute kidney failure with tubular necrosis: Secondary | ICD-10-CM

## 2017-07-21 DIAGNOSIS — I129 Hypertensive chronic kidney disease with stage 1 through stage 4 chronic kidney disease, or unspecified chronic kidney disease: Secondary | ICD-10-CM | POA: Diagnosis present

## 2017-07-21 DIAGNOSIS — Z8673 Personal history of transient ischemic attack (TIA), and cerebral infarction without residual deficits: Secondary | ICD-10-CM | POA: Diagnosis not present

## 2017-07-21 DIAGNOSIS — E1110 Type 2 diabetes mellitus with ketoacidosis without coma: Secondary | ICD-10-CM | POA: Diagnosis not present

## 2017-07-21 DIAGNOSIS — Y92009 Unspecified place in unspecified non-institutional (private) residence as the place of occurrence of the external cause: Secondary | ICD-10-CM

## 2017-07-21 DIAGNOSIS — K2970 Gastritis, unspecified, without bleeding: Secondary | ICD-10-CM | POA: Diagnosis present

## 2017-07-21 DIAGNOSIS — R001 Bradycardia, unspecified: Secondary | ICD-10-CM | POA: Diagnosis present

## 2017-07-21 DIAGNOSIS — Z7982 Long term (current) use of aspirin: Secondary | ICD-10-CM

## 2017-07-21 DIAGNOSIS — R1310 Dysphagia, unspecified: Secondary | ICD-10-CM | POA: Diagnosis present

## 2017-07-21 DIAGNOSIS — Z888 Allergy status to other drugs, medicaments and biological substances status: Secondary | ICD-10-CM | POA: Diagnosis not present

## 2017-07-21 DIAGNOSIS — I6300 Cerebral infarction due to thrombosis of unspecified precerebral artery: Secondary | ICD-10-CM | POA: Diagnosis not present

## 2017-07-21 DIAGNOSIS — I700 Atherosclerosis of aorta: Secondary | ICD-10-CM | POA: Diagnosis not present

## 2017-07-21 DIAGNOSIS — Z955 Presence of coronary angioplasty implant and graft: Secondary | ICD-10-CM

## 2017-07-21 DIAGNOSIS — D631 Anemia in chronic kidney disease: Secondary | ICD-10-CM | POA: Diagnosis present

## 2017-07-21 DIAGNOSIS — Z681 Body mass index (BMI) 19 or less, adult: Secondary | ICD-10-CM

## 2017-07-21 DIAGNOSIS — K2990 Gastroduodenitis, unspecified, without bleeding: Secondary | ICD-10-CM | POA: Diagnosis not present

## 2017-07-21 DIAGNOSIS — E785 Hyperlipidemia, unspecified: Secondary | ICD-10-CM | POA: Diagnosis present

## 2017-07-21 DIAGNOSIS — Z9049 Acquired absence of other specified parts of digestive tract: Secondary | ICD-10-CM

## 2017-07-21 DIAGNOSIS — E876 Hypokalemia: Secondary | ICD-10-CM | POA: Diagnosis not present

## 2017-07-21 DIAGNOSIS — T465X6A Underdosing of other antihypertensive drugs, initial encounter: Secondary | ICD-10-CM | POA: Diagnosis present

## 2017-07-21 DIAGNOSIS — K210 Gastro-esophageal reflux disease with esophagitis: Secondary | ICD-10-CM | POA: Diagnosis present

## 2017-07-21 DIAGNOSIS — Z823 Family history of stroke: Secondary | ICD-10-CM | POA: Diagnosis not present

## 2017-07-21 DIAGNOSIS — R0600 Dyspnea, unspecified: Secondary | ICD-10-CM | POA: Diagnosis not present

## 2017-07-21 DIAGNOSIS — N183 Chronic kidney disease, stage 3 (moderate): Secondary | ICD-10-CM | POA: Diagnosis not present

## 2017-07-21 DIAGNOSIS — I160 Hypertensive urgency: Secondary | ICD-10-CM | POA: Diagnosis not present

## 2017-07-21 DIAGNOSIS — Z79899 Other long term (current) drug therapy: Secondary | ICD-10-CM

## 2017-07-21 DIAGNOSIS — I2510 Atherosclerotic heart disease of native coronary artery without angina pectoris: Secondary | ICD-10-CM | POA: Diagnosis present

## 2017-07-21 DIAGNOSIS — R634 Abnormal weight loss: Secondary | ICD-10-CM | POA: Diagnosis not present

## 2017-07-21 DIAGNOSIS — K2950 Unspecified chronic gastritis without bleeding: Secondary | ICD-10-CM | POA: Diagnosis not present

## 2017-07-21 DIAGNOSIS — R748 Abnormal levels of other serum enzymes: Secondary | ICD-10-CM | POA: Diagnosis present

## 2017-07-21 DIAGNOSIS — I313 Pericardial effusion (noninflammatory): Secondary | ICD-10-CM | POA: Diagnosis present

## 2017-07-21 DIAGNOSIS — E1169 Type 2 diabetes mellitus with other specified complication: Secondary | ICD-10-CM | POA: Diagnosis present

## 2017-07-21 DIAGNOSIS — Z9114 Patient's other noncompliance with medication regimen: Secondary | ICD-10-CM | POA: Diagnosis not present

## 2017-07-21 DIAGNOSIS — Z8249 Family history of ischemic heart disease and other diseases of the circulatory system: Secondary | ICD-10-CM

## 2017-07-21 DIAGNOSIS — N185 Chronic kidney disease, stage 5: Secondary | ICD-10-CM | POA: Diagnosis present

## 2017-07-21 DIAGNOSIS — Z90710 Acquired absence of both cervix and uterus: Secondary | ICD-10-CM

## 2017-07-21 LAB — HEMOGLOBIN A1C
HEMOGLOBIN A1C: 5.2 % (ref 4.8–5.6)
Mean Plasma Glucose: 102.54 mg/dL

## 2017-07-21 LAB — TROPONIN I
TROPONIN I: 0.04 ng/mL — AB (ref <0.03)
Troponin I: 0.03 ng/mL (ref <0.03)

## 2017-07-21 LAB — COMPREHENSIVE METABOLIC PANEL
ALBUMIN: 3.3 g/dL — AB (ref 3.5–5.0)
ALT: 7 U/L — ABNORMAL LOW (ref 14–54)
AST: 11 U/L — ABNORMAL LOW (ref 15–41)
Alkaline Phosphatase: 214 U/L — ABNORMAL HIGH (ref 38–126)
Anion gap: 9 (ref 5–15)
BILIRUBIN TOTAL: 0.3 mg/dL (ref 0.3–1.2)
BUN: 26 mg/dL — ABNORMAL HIGH (ref 6–20)
CO2: 18 mmol/L — ABNORMAL LOW (ref 22–32)
Calcium: 8.1 mg/dL — ABNORMAL LOW (ref 8.9–10.3)
Chloride: 112 mmol/L — ABNORMAL HIGH (ref 101–111)
Creatinine, Ser: 3.68 mg/dL — ABNORMAL HIGH (ref 0.44–1.00)
GFR calc Af Amer: 13 mL/min — ABNORMAL LOW (ref >60)
GFR, EST NON AFRICAN AMERICAN: 12 mL/min — AB (ref >60)
Glucose, Bld: 122 mg/dL — ABNORMAL HIGH (ref 65–99)
POTASSIUM: 3.1 mmol/L — AB (ref 3.5–5.1)
Sodium: 139 mmol/L (ref 135–145)
TOTAL PROTEIN: 6.9 g/dL (ref 6.5–8.1)

## 2017-07-21 LAB — CBC
HCT: 24.7 % — ABNORMAL LOW (ref 36.0–46.0)
HEMATOCRIT: 28.9 % — AB (ref 36.0–46.0)
HEMOGLOBIN: 9.3 g/dL — AB (ref 12.0–15.0)
Hemoglobin: 8 g/dL — ABNORMAL LOW (ref 12.0–15.0)
MCH: 28.4 pg (ref 26.0–34.0)
MCH: 28.5 pg (ref 26.0–34.0)
MCHC: 32.2 g/dL (ref 30.0–36.0)
MCHC: 32.4 g/dL (ref 30.0–36.0)
MCV: 88.4 fL (ref 78.0–100.0)
MCV: 87.9 fL (ref 78.0–100.0)
PLATELETS: 302 10*3/uL (ref 150–400)
Platelets: 333 10*3/uL (ref 150–400)
RBC: 3.27 MIL/uL — ABNORMAL LOW (ref 3.87–5.11)
RBC: 2.81 MIL/uL — ABNORMAL LOW (ref 3.87–5.11)
RDW: 14.8 % (ref 11.5–15.5)
RDW: 14.8 % (ref 11.5–15.5)
WBC: 5.7 10*3/uL (ref 4.0–10.5)
WBC: 5.4 10*3/uL (ref 4.0–10.5)

## 2017-07-21 LAB — LIPID PANEL
Cholesterol: 183 mg/dL (ref 0–200)
HDL: 57 mg/dL (ref >40)
LDL Cholesterol: 100 mg/dL — ABNORMAL HIGH (ref 0–99)
Total CHOL/HDL Ratio: 3.2 RATIO
Triglycerides: 129 mg/dL (ref <150)
VLDL: 26 mg/dL (ref 0–40)

## 2017-07-21 LAB — URINALYSIS, ROUTINE W REFLEX MICROSCOPIC
BACTERIA UA: NONE SEEN
BILIRUBIN URINE: NEGATIVE
Glucose, UA: NEGATIVE mg/dL
Ketones, ur: NEGATIVE mg/dL
NITRITE: NEGATIVE
PH: 5 (ref 5.0–8.0)
Protein, ur: >=300 mg/dL — AB
SPECIFIC GRAVITY, URINE: 1.009 (ref 1.005–1.030)

## 2017-07-21 LAB — TSH
TSH: 0.77 u[IU]/mL (ref 0.350–4.500)
TSH: 0.631 u[IU]/mL (ref 0.350–4.500)

## 2017-07-21 LAB — MAGNESIUM: MAGNESIUM: 1.1 mg/dL — AB (ref 1.7–2.4)

## 2017-07-21 LAB — CREATININE, SERUM
Creatinine, Ser: 3.51 mg/dL — ABNORMAL HIGH (ref 0.44–1.00)
GFR calc Af Amer: 14 mL/min — ABNORMAL LOW (ref >60)
GFR calc non Af Amer: 12 mL/min — ABNORMAL LOW (ref >60)

## 2017-07-21 LAB — LIPASE, BLOOD: Lipase: 36 U/L (ref 11–51)

## 2017-07-21 MED ORDER — HEPARIN SODIUM (PORCINE) 5000 UNIT/ML IJ SOLN
5000.0000 [IU] | Freq: Three times a day (TID) | INTRAMUSCULAR | Status: DC
  Filled 2017-07-21 (×5): qty 1

## 2017-07-21 MED ORDER — ONDANSETRON HCL 4 MG/2ML IJ SOLN: 4.0000 mg | Freq: Four times a day (QID) | INTRAMUSCULAR | Status: DC | PRN

## 2017-07-21 MED ORDER — ONDANSETRON HCL 4 MG/2ML IJ SOLN
4.0000 mg | Freq: Four times a day (QID) | INTRAMUSCULAR | Status: DC | PRN
Start: 2017-07-21 — End: 2017-07-25

## 2017-07-21 MED ORDER — AMLODIPINE BESYLATE 10 MG PO TABS
10.0000 mg | ORAL_TABLET | Freq: Every day | ORAL | Status: DC
  Administered 2017-07-22 – 2017-07-24 (×2): 10 mg via ORAL
  Filled 2017-07-21 (×3): qty 1

## 2017-07-21 MED ORDER — MAGNESIUM SULFATE 2 GM/50ML IV SOLN
2.0000 g | Freq: Once | INTRAVENOUS | Status: AC
  Administered 2017-07-21: 2 g via INTRAVENOUS
  Filled 2017-07-21: qty 50

## 2017-07-21 MED ORDER — LEVALBUTEROL HCL 0.63 MG/3ML IN NEBU: 0.6300 mg | INHALATION_SOLUTION | Freq: Four times a day (QID) | RESPIRATORY_TRACT | Status: DC | PRN

## 2017-07-21 MED ORDER — POTASSIUM CHLORIDE IN NACL 20-0.9 MEQ/L-% IV SOLN
INTRAVENOUS | Status: DC
  Administered 2017-07-22 (×2): via INTRAVENOUS
  Filled 2017-07-21 (×2): qty 1000

## 2017-07-21 MED ORDER — ACETAMINOPHEN 650 MG RE SUPP: 650.0000 mg | Freq: Four times a day (QID) | RECTAL | Status: DC | PRN

## 2017-07-21 MED ORDER — POTASSIUM CHLORIDE 10 MEQ/100ML IV SOLN
10.0000 meq | INTRAVENOUS | Status: AC
  Administered 2017-07-21 (×2): 10 meq via INTRAVENOUS
  Filled 2017-07-21 (×2): qty 100

## 2017-07-21 MED ORDER — PANTOPRAZOLE SODIUM 40 MG IV SOLR
40.0000 mg | Freq: Two times a day (BID) | INTRAVENOUS | Status: DC
  Administered 2017-07-21 – 2017-07-22 (×2): 40 mg via INTRAVENOUS
  Filled 2017-07-21 (×2): qty 40

## 2017-07-21 MED ORDER — ASPIRIN EC 81 MG PO TBEC
81.0000 mg | DELAYED_RELEASE_TABLET | Freq: Every day | ORAL | Status: DC
  Administered 2017-07-22 – 2017-07-25 (×3): 81 mg via ORAL
  Filled 2017-07-21 (×4): qty 1

## 2017-07-21 MED ORDER — ONDANSETRON HCL 4 MG PO TABS: 4.0000 mg | ORAL_TABLET | Freq: Four times a day (QID) | ORAL | Status: DC | PRN

## 2017-07-21 MED ORDER — ACETAMINOPHEN 325 MG PO TABS: 650.0000 mg | ORAL_TABLET | Freq: Four times a day (QID) | ORAL | Status: DC | PRN

## 2017-07-21 MED ORDER — ONDANSETRON 4 MG PO TBDP: 4.0000 mg | ORAL_TABLET | Freq: Three times a day (TID) | ORAL | Status: DC | PRN

## 2017-07-21 MED ORDER — CARVEDILOL 25 MG PO TABS
25.0000 mg | ORAL_TABLET | Freq: Two times a day (BID) | ORAL | Status: DC
  Administered 2017-07-22 – 2017-07-24 (×6): 25 mg via ORAL
  Filled 2017-07-21 (×7): qty 1

## 2017-07-21 MED ORDER — HYDRALAZINE HCL 20 MG/ML IJ SOLN
10.0000 mg | Freq: Four times a day (QID) | INTRAMUSCULAR | Status: DC | PRN
  Administered 2017-07-21 – 2017-07-25 (×2): 10 mg via INTRAVENOUS
  Filled 2017-07-21 (×2): qty 1

## 2017-07-21 MED ORDER — MAGNESIUM SULFATE 50 % IJ SOLN: 2.0000 g | Freq: Once | INTRAMUSCULAR | Status: DC

## 2017-07-21 MED ORDER — LABETALOL HCL 5 MG/ML IV SOLN
10.0000 mg | Freq: Once | INTRAVENOUS | Status: AC
  Administered 2017-07-21: 10 mg via INTRAVENOUS
  Filled 2017-07-21: qty 4

## 2017-07-21 MED ORDER — ONDANSETRON 4 MG PO TBDP: 4.0000 mg | ORAL_TABLET | Freq: Three times a day (TID) | ORAL | 0 refills | Status: DC | PRN

## 2017-07-21 MED ORDER — IOPAMIDOL (ISOVUE-300) INJECTION 61%
30.0000 mL | Freq: Once | INTRAVENOUS | Status: AC | PRN
  Administered 2017-07-21: 30 mL via ORAL

## 2017-07-21 NOTE — H&P (Addendum)
[facility] History and Physical  Stephanie Gross WRU:[id number] DOB: 18-Aug-1945 DOA: 07/21/2017  Referring physician:   PCP: Velna Ochs, MD   Chief Complaint:  Weight loss  HPI:   72 year old female with a history of coronary artery disease, diabetes, CK D stage III, hypertension, dyslipidemia, tobacco abuse [quit smoking several weeks ago] previously followed by Kimberlee Nearing presents today with poor appetite, weight loss, inability to swallow.she complains of constant nausea. She has a history of gastric ulcer. She has not had any EGD since 2015. She  Feels that food gets stuck in her mid chest and then sits there, and she has to make herself vomit to feel better. She has lost 30-40 pounds of weight unintentionally. She denies any hematochezia or melena. She denies any hematemesis. She continues to take an aspirin.she does not take a PPI. She states that she was taken off Plavix several years ago. She is unclear as to whether she has continued to take her antihypertensive medications are not.she has not had any recent blood work.  She has a history of coronary artery disease and had a Taxus drug eluting stent placed in the RCA in January 2007. Most recent cardiac catheterization was January 2008, which showed minimal nonobstructive coronary artery disease in the major epicardial vessels with patent RCA stent and a 90% stenosis in the midsection of the small PDA. This was treated medically. She had a stress myoview in 11/10 for atypical chest pain that showed no ischemia, LVEF 70%. Normal ABI 2010. Stroke in October 2013.  Marland Kitchen Last echo August 2016 with normal LV systolic function, mild AI ED course BP (!) 190/79   Pulse 75   Temp 98.2 F (36.8 C) (Oral)   Resp 14   Ht 5' 5.5" (1.664 m)   Wt 54.4 kg (120 lb)   SpO2 100%   BMI 19.67 kg/m  In the ED today patient is found to have  Magnesium 1.1, potassium 3.1, creatinine 3.68, hemoglobin 9.3, Patient is being admitted for the  above complaints of dysphagia, weight loss as well as workup of her abnormal labs   Review of Systems: negative for the following  Constitutional: Positive for appetite change. Negative for chills and fever.  HENT: Positive for trouble swallowing. Negative for congestion and sore throat.   Eyes: Negative for photophobia and visual disturbance.  Respiratory: Negative for cough, shortness of breath and wheezing.   Cardiovascular: Negative for chest pain and palpitations.  Gastrointestinal: Positive for nausea. Negative for abdominal pain, blood in stool, diarrhea and vomiting.  Genitourinary: Negative for dysuria and flank pain.  Musculoskeletal: Negative for back pain, myalgias and neck pain.  Skin: Negative for rash and wound.  Neurological: Negative for dizziness, syncope, weakness, light-headedness, numbness and headaches.   Hematological: Denies adenopathy. Easy bruising, personal or family bleeding history  Psychiatric/Behavioral: Denies suicidal ideation, mood changes, confusion, nervousness, sleep disturbance and agitation       Past Medical History:  Diagnosis Date  . Arthritis   . AVD (aortic valve disease)   . CAD (coronary artery disease)    s/p RCA stent 2007. Myoview 2010 normal  . Chronic obstructive bronchitis without exacerbation ([place])   . 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 Degeneration of meniscus of knee   . Derangement of left knee   . Diabetes mellitus without complication ([place])   . DM (diabetes mellitus) ([place])   . DM (diabetes mellitus) type 2, uncontrolled, with ketoacidosis (  [place]) 03/12/1994   HIGH RISK FEET.. Please have patient take shoes and socks off every visit for visual foot inspection.  NPDR [facility] CSDME on 09/2011  Qualifier: Diagnosis of  By: Tomasa Hosteller MD, Edmon Crape.    . GERD (gastroesophageal reflux disease)   . HTN (hypertension)   . Hyperlipidemia   . Hypertension   . Obesity   . Vitamin D  deficiency      Past Surgical History:  Procedure Laterality Date  . ABDOMINAL HYSTERECTOMY    . CHOLECYSTECTOMY    . CORONARY ANGIOPLASTY WITH STENT PLACEMENT    . KNEE SURGERY    . SHOULDER SURGERY        Social History:  reports that she has been smoking cigarettes.  She has a 15.00 pack-year smoking history. She has never used smokeless tobacco. She reports that she does not drink alcohol or use drugs.    Allergies  Allergen Reactions  . Benazepril-Hydrochlorothiazide Other (See Comments)    UNKNOWN. Ok with lisinopril  . Chantix [Varenicline]     SUICIDAL IDEATION  . Codeine Nausea And Vomiting  . Methylprednisolone Other (See Comments)    UNKNOWN  . Statins Other (See Comments)    "walking into a hole", heavy legs, feels like she is going to fall    Family History  Problem Relation Age of Onset  . Stroke Mother   . Diabetes Mother   . Hypertension Mother   . Diabetes Maternal Grandmother   . Diabetes Sister         Prior to Admission medications   Medication Sig Start Date End Date Taking? Authorizing Provider  aspirin EC 81 MG tablet Take 1 tablet (81 mg total) by mouth daily. 06/02/16  Yes Velna Ochs, MD  ondansetron (ZOFRAN ODT) 4 MG disintegrating tablet Take 1 tablet (4 mg total) by mouth every 8 (eight) hours as needed for nausea or vomiting. 07/21/17  Yes Recardo Evangelist, PA-C  amLODipine (NORVASC) 10 MG tablet Take 1 tablet (10 mg total) by mouth daily. 06/02/16 06/02/17  Velna Ochs, MD  carvedilol (COREG) 25 MG tablet Take 1 tablet (25 mg total) by mouth 2 (two) times daily. 12/05/16 03/05/17  Burnell Blanks, MD     Physical Exam: Vitals:   07/21/17 1454 07/21/17 1540 07/21/17 1545 07/21/17 1600  BP: (!) 201/81 (!) 190/79 (!) 183/85 (!) 196/84  Pulse: 78 75 75 76  Resp: 17 14 13 14   Temp:      TempSrc:      SpO2: 100% 100% 100%   Weight:      Height:            Vitals:   07/21/17 1454 07/21/17 1540 07/21/17 1545  07/21/17 1600  BP: (!) 201/81 (!) 190/79 (!) 183/85 (!) 196/84  Pulse: 78 75 75 76  Resp: 17 14 13 14   Temp:      TempSrc:      SpO2: 100% 100% 100%   Weight:      Height:       Constitutional: cachectic Eyes: PERRL, lids and conjunctivae normal ENMT: Mucous membranes are moist. Posterior pharynx clear of any exudate or lesions.Normal dentition.  Neck: normal, supple, no masses, no thyromegaly Respiratory: clear to auscultation bilaterally, no wheezing, no crackles. Normal respiratory effort. No accessory muscle use.  Cardiovascular: Regular rate and rhythm, no murmurs / rubs / gallops. No extremity edema. 2+ pedal pulses. No carotid bruits.  Abdomen: no tenderness, no masses palpated. No hepatosplenomegaly. Bowel sounds  positive.  Musculoskeletal: no clubbing / cyanosis. No joint deformity upper and lower extremities. Good ROM, no contractures. Normal muscle tone.  Skin: no rashes, lesions, ulcers. No induration Neurologic: CN 2-12 grossly intact. Sensation intact, DTR normal. Strength 5/5 in all 4.  Psychiatric: Normal judgment and insight. Alert and oriented x 3. Normal mood.     Labs on Admission: I have personally reviewed following labs and imaging studies  CBC: Recent Labs  Lab 07/20/17 2243 07/21/17 1416  WBC 5.7 5.7  NEUTROABS 3.2  --   HGB 8.4* 9.3*  HCT 26.3* 28.9*  MCV 88.9 88.4  PLT 326 768    Basic Metabolic Panel: Recent Labs  Lab 07/20/17 2243 07/21/17 1415 07/21/17 1416  NA 141  --  139  K 2.8*  --  3.1*  CL 114*  --  112*  CO2 18*  --  18*  GLUCOSE 78  --  122*  BUN 28*  --  26*  CREATININE 3.73*  --  3.68*  CALCIUM 8.1*  --  8.1*  MG  --  1.1*  --     GFR: Estimated Creatinine Clearance: 12.4 mL/min (A) (by C-G formula based on SCr of 3.68 mg/dL (H)).  Liver Function Tests: Recent Labs  Lab 07/20/17 2243 07/21/17 1416  AST 11* 11*  ALT 6* 7*  ALKPHOS 208* 214*  BILITOT 0.1* 0.3  PROT 6.7 6.9  ALBUMIN 3.2* 3.3*   Recent Labs   Lab 07/21/17 1415  LIPASE 36   No results for input(s): AMMONIA in the last 168 hours.  Coagulation Profile: No results for input(s): INR, PROTIME in the last 168 hours. No results for input(s): DDIMER in the last 72 hours.  Cardiac Enzymes: No results for input(s): CKTOTAL, CKMB, CKMBINDEX, TROPONINI in the last 168 hours.  BNP (last 3 results) No results for input(s): PROBNP in the last 8760 hours.  HbA1C: No results for input(s): HGBA1C in the last 72 hours. Lab Results  Component Value Date   HGBA1C 6.3 06/02/2016   HGBA1C 6.5 09/10/2015   HGBA1C 6.8 05/21/2015     CBG: No results for input(s): GLUCAP in the last 168 hours.  Lipid Profile: No results for input(s): CHOL, HDL, LDLCALC, TRIG, CHOLHDL, LDLDIRECT in the last 72 hours.  Thyroid Function Tests: No results for input(s): TSH, T4TOTAL, FREET4, T3FREE, THYROIDAB in the last 72 hours.  Anemia Panel: No results for input(s): VITAMINB12, FOLATE, FERRITIN, TIBC, IRON, RETICCTPCT in the last 72 hours.  Urine analysis:    Component Value Date/Time   COLORURINE YELLOW 07/21/2017 1427   APPEARANCEUR CLEAR 07/21/2017 1427   LABSPEC 1.009 07/21/2017 1427   PHURINE 5.0 07/21/2017 1427   GLUCOSEU NEGATIVE 07/21/2017 1427   HGBUR MODERATE (A) 07/21/2017 1427   BILIRUBINUR NEGATIVE 07/21/2017 1427   KETONESUR NEGATIVE 07/21/2017 1427   PROTEINUR >=300 (A) 07/21/2017 1427   UROBILINOGEN 0.2 11/07/2013 2152   NITRITE NEGATIVE 07/21/2017 1427   LEUKOCYTESUR TRACE (A) 07/21/2017 1427    Sepsis Labs: @LABRCNTIP (procalcitonin:4,lacticidven:4) )No results found for this or any previous visit (from the past 240 hour(s)).       Radiological Exams on Admission: No results found. No results found.    EKG: Independently reviewed. *   Assessment/Plan Active Problems:   DM (diabetes mellitus) type 2, uncontrolled, with ketoacidosis ([place])   Hyperlipidemia associated with type 2 diabetes mellitus ([place])    Uncontrolled hypertension   CVA (cerebral vascular accident) ([place])   CKD (chronic kidney disease) stage 3, GFR  30-59 ml/min (HCC)   Acute renal failure (ARF) (HCC)  Hypertensive urgency Suspect due to noncompliance Patient is unsure if she takes Norvasc and Coreg however these were reordered Prn hydralazine as needed Admit to telemetry Will continue to cycle cardiac enzymes 2-D echo to ensure that her ejection fraction is unchanged  Acute on chronic renal failure, baseline creatinine which was 3 years ago was around 1.7, anemia of chronic disease Suspect slowly progressive chronic kidney disease due to poorly managed diabetes as well as hypertension Renal ultrasound to rule out hydronephrosis Gentle hydration May need to establish with nephrology at some point  Severe hypokalemia and hypomagnesemia Replete   History of  Coronary artery disease, Continue aspirin and Coreg Cycle troponins Patient states that she quit smoking 2-D echo to rule out wall motion abnormalities Hemoglobin A1c and lipid panel  Intractable nausea, dysphagia, weight loss Obtain CT chest abdomen pelvis to rule out underlying malignancy Barium esophagogram She may need an endoscopy due to her weight loss  Diabetes mellitus type 2 Will check hemoglobin A1c Currently on no medications at home  Elevated alkaline phosphatase Will check TSH and vitamin D, likely due to vitamin D deficiency /chronic kidney disease   DVT prophylaxis:  heparin  Code Status History    Date Active Date Inactive Code Status Order ID Comments User Context   12/08/2014 1835 12/10/2014 1412 Full Code [id number]  Lucious Groves, DO Inpatient   02/07/2012 1359 02/08/2012 1445 Full Code [id number]  Reyne Dumas, MD ED       consults called:none  Family Communication: Admission, patients condition and plan of care including tests being ordered have been discussed with the patient  who indicates understanding and agree with the  plan and Code Status  Admission status: inpatient    Disposition plan: Further plan will depend as patient's clinical course evolves and further radiologic and laboratory data become available. Likely home when stable   At the time of admission, it appears that the appropriate admission status for this patient is INPATIENT .Thisis judged to be reasonable and necessary in order to provide the required intensity of service to ensure the patient's safetygiven thepresenting symptoms, physical exam findings, and initial radiographic and laboratory data in the context of their chronic comorbidities.   Reyne Dumas MD [facility] Pager [phone]  If 7PM-7AM, please contact night-coverage [email or web address] Password [facility]  07/21/2017, 4:14 PM

## 2017-07-21 NOTE — ED Notes (Addendum)
Pt agreed to allow this nurse to take the EKG but stated after she wants to go over her lab results and go. This nurse gave pt multiple warm blankets, peanut butter, crackers, and juice per request.

## 2017-07-21 NOTE — ED Notes (Signed)
Per Dr. Lita Mains, pt allowed to eat and drink. This RN gave patient a [place] sandwich, water, and apple sauce.

## 2017-07-21 NOTE — ED Provider Notes (Signed)
[facility] DEPT Provider Note   CSN: [id number] Arrival date & time: 07/21/17  1232     History   Chief Complaint Chief Complaint  Patient presents with  . Hypertension    HPI Stephanie Gross is a 72 y.o. female.  HPI Patient was seen yesterday evening in the emergency department.  Chief complaint was nausea.  She is found to be hypertensive and an acute on chronic renal failure.  She refused admission at that time.  Stated she need to go home to do some things before coming back.  She presents now with no current complaints.  States that her nausea is episodic.  She has lost about 30 or 40 pounds of the last 4 months.  She denies any blurred vision, chest pain, shortness of breath or abdominal pain.  No new lower extremity swelling or pain.  No focal weakness or numbness.  Patient says she has not been taking her blood pressure medication for several months now. Past Medical History:  Diagnosis Date  . Arthritis   . AVD (aortic valve disease)   . CAD (coronary artery disease)    s/p RCA stent 2007. Myoview 2010 normal  . Chronic obstructive bronchitis without exacerbation ([place])   . 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 Degeneration of meniscus of knee   . Derangement of left knee   . Diabetes mellitus without complication ([place])   . DM (diabetes mellitus) ([place])   . DM (diabetes mellitus) type 2, uncontrolled, with ketoacidosis ([place]) 03/12/1994   HIGH RISK FEET.. Please have patient take shoes and socks off every visit for visual foot inspection.  NPDR [facility] CSDME on 09/2011  Qualifier: Diagnosis of  By: Tomasa Hosteller MD, Edmon Crape.    . GERD (gastroesophageal reflux disease)   . HTN (hypertension)   . Hyperlipidemia   . Hypertension   . Obesity   . Vitamin D deficiency     Patient Active Problem List   Diagnosis Date Noted  . Noncompliance with medications 06/03/2016  . CKD (chronic kidney  disease) stage 3, GFR 30-59 ml/min (HCC) 06/27/2015  . Tobacco abuse   . CVA (cerebral vascular accident) ([place]) 02/07/2012  . Preventative health care 03/13/2011  . Hyperlipidemia associated with type 2 diabetes mellitus ([place]) 03/12/2006  . Uncontrolled hypertension 03/12/2006  . CORONARY ARTERY DISEASE 03/12/2006  . DM (diabetes mellitus) type 2, uncontrolled, with ketoacidosis ([place]) 03/12/1994    Past Surgical History:  Procedure Laterality Date  . ABDOMINAL HYSTERECTOMY    . CHOLECYSTECTOMY    . CORONARY ANGIOPLASTY WITH STENT PLACEMENT    . KNEE SURGERY    . SHOULDER SURGERY       OB History   None      Home Medications    Prior to Admission medications   Medication Sig Start Date End Date Taking? Authorizing Provider  aspirin EC 81 MG tablet Take 1 tablet (81 mg total) by mouth daily. 06/02/16  Yes Velna Ochs, MD  ondansetron (ZOFRAN ODT) 4 MG disintegrating tablet Take 1 tablet (4 mg total) by mouth every 8 (eight) hours as needed for nausea or vomiting. 07/21/17  Yes Recardo Evangelist, PA-C  amLODipine (NORVASC) 10 MG tablet Take 1 tablet (10 mg total) by mouth daily. 06/02/16 06/02/17  Velna Ochs, MD  carvedilol (COREG) 25 MG tablet Take 1 tablet (25 mg total) by mouth 2 (two) times daily. 12/05/16 03/05/17  Burnell Blanks, MD  Family History Family History  Problem Relation Age of Onset  . Stroke Mother   . Diabetes Mother   . Hypertension Mother   . Diabetes Maternal Grandmother   . Diabetes Sister     Social History Social History   Tobacco Use  . Smoking status: Current Every Day Smoker    Packs/day: 0.50    Years: 30.00    Pack years: 15.00    Types: Cigarettes  . Smokeless tobacco: Never Used  . Tobacco comment: . Started again 4-5 cigs./day  Substance Use Topics  . Alcohol use: No    Alcohol/week: 0.0 oz  . Drug use: No     Allergies   Benazepril-hydrochlorothiazide; Chantix [varenicline]; Codeine; Methylprednisolone; and  Statins   Review of Systems Review of Systems  Constitutional: Positive for appetite change. Negative for chills and fever.  HENT: Positive for trouble swallowing. Negative for congestion and sore throat.   Eyes: Negative for photophobia and visual disturbance.  Respiratory: Negative for cough, shortness of breath and wheezing.   Cardiovascular: Negative for chest pain and palpitations.  Gastrointestinal: Positive for nausea. Negative for abdominal pain, blood in stool, diarrhea and vomiting.  Genitourinary: Negative for dysuria and flank pain.  Musculoskeletal: Negative for back pain, myalgias and neck pain.  Skin: Negative for rash and wound.  Neurological: Negative for dizziness, syncope, weakness, light-headedness, numbness and headaches.  All other systems reviewed and are negative.    Physical Exam Updated Vital Signs BP (!) 190/79   Pulse 75   Temp 98.2 F (36.8 C) (Oral)   Resp 14   Ht 5' 5.5" (1.664 m)   Wt 54.4 kg (120 lb)   SpO2 100%   BMI 19.67 kg/m   Physical Exam  Constitutional: She is oriented to person, place, and time. She appears well-developed and well-nourished. No distress.  HENT:  Head: Normocephalic and atraumatic.  Mouth/Throat: Oropharynx is clear and moist. No oropharyngeal exudate.  Eyes: Pupils are equal, round, and reactive to light. Conjunctivae and EOM are normal.  Neck: Normal range of motion. Neck supple. No JVD present.  Cardiovascular: Normal rate and regular rhythm. Exam reveals no gallop and no friction rub.  No murmur heard. Pulmonary/Chest: Effort normal and breath sounds normal. No stridor. No respiratory distress. She has no wheezes. She has no rales. She exhibits no tenderness.  Abdominal: Soft. Bowel sounds are normal. She exhibits no distension and no mass. There is no tenderness. There is no rebound and no guarding.  Musculoskeletal: Normal range of motion. She exhibits no edema or tenderness.  No lower extremity swelling,  asymmetry or tenderness.  Lymphadenopathy:    She has no cervical adenopathy.  Neurological: She is alert and oriented to person, place, and time.  Cranial nerves II through XII grossly intact.  5/5 motor in all extremities.  Sensation fully intact.  Skin: Skin is warm and dry. Capillary refill takes less than 2 seconds. No rash noted. She is not diaphoretic. No erythema.  Psychiatric: She has a normal mood and affect. Her behavior is normal.  Nursing note and vitals reviewed.    ED Treatments / Results  Labs (all labs ordered are listed, but only abnormal results are displayed) Labs Reviewed  COMPREHENSIVE METABOLIC PANEL - Abnormal; Notable for the following components:      Result Value   Potassium 3.1 (*)    Chloride 112 (*)    CO2 18 (*)    Glucose, Bld 122 (*)    BUN 26 (*)  Creatinine, Ser 3.68 (*)    Calcium 8.1 (*)    Albumin 3.3 (*)    AST 11 (*)    ALT 7 (*)    Alkaline Phosphatase 214 (*)    GFR calc non Af Amer 12 (*)    GFR calc Af Amer 13 (*)    All other components within normal limits  CBC - Abnormal; Notable for the following components:   RBC 3.27 (*)    Hemoglobin 9.3 (*)    HCT 28.9 (*)    All other components within normal limits  MAGNESIUM - Abnormal; Notable for the following components:   Magnesium 1.1 (*)    All other components within normal limits  URINALYSIS, ROUTINE W REFLEX MICROSCOPIC - Abnormal; Notable for the following components:   Hgb urine dipstick MODERATE (*)    Protein, ur >=300 (*)    Leukocytes, UA TRACE (*)    Squamous Epithelial / LPF 0-5 (*)    All other components within normal limits  LIPASE, BLOOD    EKG EKG Interpretation  Date/Time:  Tuesday July 21 2017 14:39:26 EDT Ventricular Rate:  79 PR Interval:    QRS Duration: 76 QT Interval:  424 QTC Calculation: 487 R Axis:   39 Text Interpretation:  Sinus rhythm Probable left atrial enlargement Probable LVH with secondary repol abnrm Borderline prolonged QT  interval Confirmed by Julianne Rice [phone]) on 07/21/2017 3:45:52 PM   Radiology No results found.  Procedures Procedures (including critical care time)  Medications Ordered in ED Medications  potassium chloride 10 mEq in 100 mL IVPB (10 mEq Intravenous New Bag/Given 07/21/17 1538)  magnesium sulfate IVPB 2 g 50 mL (has no administration in time range)  labetalol (NORMODYNE,TRANDATE) injection 10 mg (10 mg Intravenous Given 07/21/17 1537)     Initial Impression / Assessment and Plan / ED Course  I have reviewed the triage vital signs and the nursing notes.  Pertinent labs & imaging results that were available during my care of the patient were reviewed by me and considered in my medical decision making (see chart for details).    Initiated initiated IV potassium and IV magnesium replacement.  Also given dose of IV labetalol.  No focal neurologic symptoms.  Patient does have acute on chronic kidney injury.  Discussed with hospitalist who will see patient in the emergency department   Final Clinical Impressions(s) / ED Diagnoses   Final diagnoses:  AKI (acute kidney injury) ([place])  Hypokalemia  Hypomagnesemia  Hypertensive urgency    ED Discharge Orders    None       Julianne Rice, MD 07/21/17 1549

## 2017-07-21 NOTE — ED Notes (Addendum)
Consulted with MD Lita Mains, CBC/CMP ordered, per MD Lita Mains.  Will pull pt into TR 3 and monitor BP/status in TR 3 until a room is available.

## 2017-07-21 NOTE — ED Notes (Signed)
Pt was called back to get the next room, but not seen in lobby.

## 2017-07-21 NOTE — ED Notes (Addendum)
Called pt back to put in TR 3, Pt not seen in lobby.

## 2017-07-21 NOTE — ED Notes (Signed)
Pt standing outside of her room again, stating she has been discharged and she needs to know where to return. This nurse informed her when her paperwork is printed I will go over everything with her.

## 2017-07-21 NOTE — ED Notes (Signed)
Pt standing outside room stating to bring the doctor in to see her now she wants to go home.

## 2017-07-21 NOTE — ED Notes (Signed)
ED TO INPATIENT HANDOFF REPORT  Name/Age/Gender Stephanie Gross 72 y.o. female  Code Status    Code Status Orders  (From admission, onward)        Start     Ordered   07/21/17 2240  Full code  Continuous     07/21/17 2240    Code Status History    Date Active Date Inactive Code Status Order ID Comments User Context   12/08/2014 1835 12/10/2014 1412 Full Code [id number]  Lucious Groves, DO Inpatient   02/07/2012 1359 02/08/2012 1445 Full Code [id number]  Reyne Dumas, MD ED      Home/SNF/Other Home  Chief Complaint Nausea; BP Issues  Level of Care/Admitting Diagnosis ED Disposition    ED Disposition Condition [facility] Area: [place] [[id number]]  Level of Care: Telemetry [5]  Admit to tele based on following criteria: Monitor QTC interval  Diagnosis: Acute renal failure (ARF) [facility]) [[id number]]  Admitting Physician: Reyne Dumas [[id number]]  Attending Physician: Reyne Dumas [[id number]]  Estimated length of stay: past midnight tomorrow  Certification:: I certify this patient will need inpatient services for at least 2 midnights  PT Class (Do Not Modify): Inpatient [101]  PT Acc Code (Do Not Modify): Private [1]       Medical History Past Medical History:  Diagnosis Date  . Arthritis   . AVD (aortic valve disease)   . CAD (coronary artery disease)    s/p RCA stent 2007. Myoview 2010 normal  . Chronic obstructive bronchitis without exacerbation ([place])   . 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 Degeneration of meniscus of knee   . Derangement of left knee   . Diabetes mellitus without complication ([place])   . DM (diabetes mellitus) ([place])   . DM (diabetes mellitus) type 2, uncontrolled, with ketoacidosis ([place]) 03/12/1994   HIGH RISK FEET.. Please have patient take shoes and socks off every visit for visual foot inspection.  NPDR [facility] CSDME on 09/2011  Qualifier: Diagnosis of  By: Tomasa Hosteller  MD, Edmon Crape.    . GERD (gastroesophageal reflux disease)   . HTN (hypertension)   . Hyperlipidemia   . Hypertension   . Obesity   . Vitamin D deficiency     Allergies Allergies  Allergen Reactions  . Benazepril-Hydrochlorothiazide Other (See Comments)    UNKNOWN. Ok with lisinopril  . Chantix [Varenicline]     SUICIDAL IDEATION  . Codeine Nausea And Vomiting  . Methylprednisolone Other (See Comments)    UNKNOWN  . Statins Other (See Comments)    "walking into a hole", heavy legs, feels like she is going to fall    IV Location/Drains/Wounds Patient Lines/Drains/Airways Status   Active Line/Drains/Airways    Name:   Placement date:   Placement time:   Site:   Days:   Peripheral IV 07/21/17 Left Antecubital   07/21/17    1538    Antecubital   less than 1          Labs/Imaging Results for orders placed or performed during the hospital encounter of 07/21/17 (from the past 48 hour(s))  Lipase, blood     Status: None   Collection Time: 07/21/17  2:15 PM  Result Value Ref Range   Lipase 36 11 - 51 U/L    Comment: Performed at [facility], [place] [address], [place], [place] [address]  Magnesium     Status: Abnormal   Collection  Time: 07/21/17  2:15 PM  Result Value Ref Range   Magnesium 1.1 (L) 1.7 - 2.4 mg/dL    Comment: Performed at [facility], [place] [address] [place], [place] [address]  Lipid panel     Status: Abnormal   Collection Time: 07/21/17  2:15 PM  Result Value Ref Range   Cholesterol 183 0 - 200 mg/dL   Triglycerides 129 <150 mg/dL   HDL 57 >40 mg/dL   Total CHOL/HDL Ratio 3.2 RATIO   VLDL 26 0 - 40 mg/dL   LDL Cholesterol 100 (H) 0 - 99 mg/dL    Comment:        Total Cholesterol/HDL:CHD Risk Coronary Heart Disease Risk Table                     Men   Women  1/2 Average Risk   3.4   3.3  Average Risk       5.0   4.4  2 X Average Risk   9.6   7.1  3 X Average Risk  23.4   11.0        Use the calculated Patient  Ratio above and the CHD Risk Table to determine the patient's CHD Risk.        ATP III CLASSIFICATION (LDL):  <100     mg/dL   Optimal  100-129  mg/dL   Near or Above                    Optimal  130-159  mg/dL   Borderline  160-189  mg/dL   High  >190     mg/dL   Very High Performed at [place] [address] [place], [place] [address]   TSH     Status: None   Collection Time: 07/21/17  2:15 PM  Result Value Ref Range   TSH 0.770 0.350 - 4.500 uIU/mL    Comment: Performed by a 3rd Generation assay with a functional sensitivity of <=0.01 uIU/mL. Performed at [facility], [place] [address], [place], [place] [address]   Comprehensive metabolic panel     Status: Abnormal   Collection Time: 07/21/17  2:16 PM  Result Value Ref Range   Sodium 139 135 - 145 mmol/L   Potassium 3.1 (L) 3.5 - 5.1 mmol/L   Chloride 112 (H) 101 - 111 mmol/L   CO2 18 (L) 22 - 32 mmol/L   Glucose, Bld 122 (H) 65 - 99 mg/dL   BUN 26 (H) 6 - 20 mg/dL   Creatinine, Ser 3.68 (H) 0.44 - 1.00 mg/dL   Calcium 8.1 (L) 8.9 - 10.3 mg/dL   Total Protein 6.9 6.5 - 8.1 g/dL   Albumin 3.3 (L) 3.5 - 5.0 g/dL   AST 11 (L) 15 - 41 U/L   ALT 7 (L) 14 - 54 U/L   Alkaline Phosphatase 214 (H) 38 - 126 U/L   Total Bilirubin 0.3 0.3 - 1.2 mg/dL   GFR calc non Af Amer 12 (L) >60 mL/min   GFR calc Af Amer 13 (L) >60 mL/min    Comment: (NOTE) The eGFR has been calculated using the CKD EPI equation. This calculation has not been validated in all clinical situations. eGFR's persistently <60 mL/min signify possible Chronic Kidney Disease.    Anion gap 9 5 - 15    Comment: Performed at [facility], [place] [address] [place], [place] [address]  CBC  Status: Abnormal   Collection Time: 07/21/17  2:16 PM  Result Value Ref Range   WBC 5.7 4.0 - 10.5 K/uL   RBC 3.27 (L) 3.87 - 5.11 MIL/uL   Hemoglobin 9.3 (L) 12.0 - 15.0 g/dL   HCT 28.9 (L) 36.0 - 46.0 %   MCV 88.4  78.0 - 100.0 fL   MCH 28.4 26.0 - 34.0 pg   MCHC 32.2 30.0 - 36.0 g/dL   RDW 14.8 11.5 - 15.5 %   Platelets 333 150 - 400 K/uL    Comment: Performed at [facility], [place] [address], [place], [place] [address]  Hemoglobin A1c     Status: None   Collection Time: 07/21/17  2:16 PM  Result Value Ref Range   Hgb A1c MFr Bld 5.2 4.8 - 5.6 %    Comment: (NOTE) Pre diabetes:          5.7%-6.4% Diabetes:              >6.4% Glycemic control for   <7.0% adults with diabetes    Mean Plasma Glucose 102.54 mg/dL    Comment: Performed at [place] [address], [place], [place] [address]  Urinalysis, Routine w reflex microscopic     Status: Abnormal   Collection Time: 07/21/17  2:27 PM  Result Value Ref Range   Color, Urine YELLOW YELLOW   APPearance CLEAR CLEAR   Specific Gravity, Urine 1.009 1.005 - 1.030   pH 5.0 5.0 - 8.0   Glucose, UA NEGATIVE NEGATIVE mg/dL   Hgb urine dipstick MODERATE (A) NEGATIVE   Bilirubin Urine NEGATIVE NEGATIVE   Ketones, ur NEGATIVE NEGATIVE mg/dL   Protein, ur >=300 (A) NEGATIVE mg/dL   Nitrite NEGATIVE NEGATIVE   Leukocytes, UA TRACE (A) NEGATIVE   RBC / HPF 0-5 0 - 5 RBC/hpf   WBC, UA 6-30 0 - 5 WBC/hpf   Bacteria, UA NONE SEEN NONE SEEN   Squamous Epithelial / LPF 0-5 (A) NONE SEEN    Comment: Performed at [facility], [place] [address], [place], [place] [address]  Troponin I     Status: Abnormal   Collection Time: 07/21/17  4:24 PM  Result Value Ref Range   Troponin I 0.04 (HH) <0.03 ng/mL    Comment: CRITICAL RESULT CALLED TO, READ BACK BY AND VERIFIED WITH: WEST,S @ 1651 ON [id number] BY POTEAT,S Performed at [facility] [address], [place], [place] [address]    Ct Abdomen Pelvis Wo Contrast  Result Date: 07/21/2017 CLINICAL DATA:  Nausea, unintentional weight loss. EXAM: CT CHEST, ABDOMEN AND PELVIS WITHOUT CONTRAST TECHNIQUE: Multidetector CT imaging of the chest, abdomen and  pelvis was performed following the standard protocol without IV contrast. COMPARISON:  CT scan of November 06, 2008. FINDINGS: CT CHEST FINDINGS Cardiovascular: Atherosclerosis of thoracic aorta is noted without aneurysm formation. Coronary artery calcifications are noted. Minimal pericardial effusion is noted. Normal cardiac size. Mediastinum/Nodes: No enlarged mediastinal, hilar, or axillary lymph nodes. Thyroid gland, trachea, and esophagus demonstrate no significant findings. Lungs/Pleura: Lungs are clear. No pleural effusion or pneumothorax. Musculoskeletal: No chest wall mass or suspicious bone lesions identified. CT ABDOMEN PELVIS FINDINGS Hepatobiliary: No focal liver abnormality is seen. Status post cholecystectomy. No biliary dilatation. Pancreas: Unremarkable. No pancreatic ductal dilatation or surrounding inflammatory changes. Spleen: Normal in size without focal abnormality. Adrenals/Urinary Tract: Adrenal glands are unremarkable. Kidneys are normal, without renal calculi, focal lesion, or hydronephrosis. Bladder is unremarkable. Stomach/Bowel: The stomach  appears normal. There is no evidence of bowel obstruction or inflammation. Sigmoid diverticulosis is noted without inflammation. The appendix is not visualized. Vascular/Lymphatic: Aortic atherosclerosis. No enlarged abdominal or pelvic lymph nodes. Reproductive: Status post hysterectomy. No adnexal masses. Other: No abdominal wall hernia or abnormality. No abdominopelvic ascites. Musculoskeletal: No acute or significant osseous findings. IMPRESSION: Coronary artery calcifications are noted suggesting coronary artery disease. Sigmoid diverticulosis without inflammation. Aortic Atherosclerosis (ICD10-I70.0). Electronically Signed   By: Marijo Conception, M.D.   On: 07/21/2017 19:30   Ct Chest Wo Contrast  Result Date: 07/21/2017 CLINICAL DATA:  Nausea, unintentional weight loss. EXAM: CT CHEST, ABDOMEN AND PELVIS WITHOUT CONTRAST TECHNIQUE: Multidetector  CT imaging of the chest, abdomen and pelvis was performed following the standard protocol without IV contrast. COMPARISON:  CT scan of November 06, 2008. FINDINGS: CT CHEST FINDINGS Cardiovascular: Atherosclerosis of thoracic aorta is noted without aneurysm formation. Coronary artery calcifications are noted. Minimal pericardial effusion is noted. Normal cardiac size. Mediastinum/Nodes: No enlarged mediastinal, hilar, or axillary lymph nodes. Thyroid gland, trachea, and esophagus demonstrate no significant findings. Lungs/Pleura: Lungs are clear. No pleural effusion or pneumothorax. Musculoskeletal: No chest wall mass or suspicious bone lesions identified. CT ABDOMEN PELVIS FINDINGS Hepatobiliary: No focal liver abnormality is seen. Status post cholecystectomy. No biliary dilatation. Pancreas: Unremarkable. No pancreatic ductal dilatation or surrounding inflammatory changes. Spleen: Normal in size without focal abnormality. Adrenals/Urinary Tract: Adrenal glands are unremarkable. Kidneys are normal, without renal calculi, focal lesion, or hydronephrosis. Bladder is unremarkable. Stomach/Bowel: The stomach appears normal. There is no evidence of bowel obstruction or inflammation. Sigmoid diverticulosis is noted without inflammation. The appendix is not visualized. Vascular/Lymphatic: Aortic atherosclerosis. No enlarged abdominal or pelvic lymph nodes. Reproductive: Status post hysterectomy. No adnexal masses. Other: No abdominal wall hernia or abnormality. No abdominopelvic ascites. Musculoskeletal: No acute or significant osseous findings. IMPRESSION: Coronary artery calcifications are noted suggesting coronary artery disease. Sigmoid diverticulosis without inflammation. Aortic Atherosclerosis (ICD10-I70.0). Electronically Signed   By: Marijo Conception, M.D.   On: 07/21/2017 19:30    Pending Labs Unresulted Labs (From admission, onward)   Start     Ordered   07/22/17 0500  Magnesium  Tomorrow morning,   R      07/21/17 2240   07/22/17 0500  Comprehensive metabolic panel  Tomorrow morning,   R     07/21/17 2240   07/22/17 0500  CBC  Tomorrow morning,   R     07/21/17 2240   07/21/17 2240  CBC  (heparin)  Once,   R    Comments:  Baseline for heparin therapy IF NOT ALREADY DRAWN.  Notify MD if PLT < 100 K.    07/21/17 2240   07/21/17 2240  Creatinine, serum  (heparin)  Once,   R    Comments:  Baseline for heparin therapy IF NOT ALREADY DRAWN.    07/21/17 2240   07/21/17 2240  TSH  Once,   R     07/21/17 2240   07/21/17 2240  Troponin I  Now then every 6 hours,   R     07/21/17 2240   07/21/17 1628  VITAMIN D 25 Hydroxy (Vit-D Deficiency, Fractures)  Once,   R     07/21/17 1627      Vitals/Pain Today's Vitals   07/21/17 2000 07/21/17 2015 07/21/17 2125 07/21/17 2227  BP: (!) 154/69 (!) 167/72 (!) 163/71 (!) 173/64  Pulse: 84 81 85 76  Resp: (!) _0 Temp:  TempSrc:      SpO2: 99% 99% 100% 98%  Weight:      Height:      PainSc:        Isolation Precautions No active isolations  Medications Medications  amLODipine (NORVASC) tablet 10 mg (has no administration in time range)  carvedilol (COREG) tablet 25 mg (has no administration in time range)  aspirin EC tablet 81 mg (has no administration in time range)  ondansetron (ZOFRAN-ODT) disintegrating tablet 4 mg (has no administration in time range)  heparin injection 5,000 Units (has no administration in time range)  0.9 % NaCl with KCl 20 mEq/ L  infusion (has no administration in time range)  acetaminophen (TYLENOL) tablet 650 mg (has no administration in time range)    Or  acetaminophen (TYLENOL) suppository 650 mg (has no administration in time range)  ondansetron (ZOFRAN) tablet 4 mg (has no administration in time range)    Or  ondansetron (ZOFRAN) injection 4 mg (has no administration in time range)  levalbuterol (XOPENEX) nebulizer solution 0.63 mg (has no administration in time range)  hydrALAZINE  (APRESOLINE) injection 10 mg (10 mg Intravenous Given 07/21/17 1822)  ondansetron (ZOFRAN) injection 4 mg (has no administration in time range)  pantoprazole (PROTONIX) injection 40 mg (40 mg Intravenous Given 07/21/17 2254)  labetalol (NORMODYNE,TRANDATE) injection 10 mg (10 mg Intravenous Given 07/21/17 1537)  potassium chloride 10 mEq in 100 mL IVPB (0 mEq Intravenous Stopped 07/21/17 1832)  magnesium sulfate IVPB 2 g 50 mL (0 g Intravenous Stopped 07/21/17 2115)  iopamidol (ISOVUE-300) 61 % injection 30 mL (30 mLs Oral Contrast Given 07/21/17 1629)    Mobility walks with person assist

## 2017-07-21 NOTE — ED Notes (Signed)
While going over discharge paperwork, this nurse explained to the patient that she would have to return to the emergency department or her primary care doctor. Pt began to get upset stating she needs to know where to go to get admitted to the hospital, I explained you cannot return and go directly upstairs you would have to be seen in the ED. Pt verbalized understanding. When attempting to have pt sign AMA form, she became upset saying that she is not leaving AMA she will come back but she has things to do at home and didn't plan on coming here to stay. This nurse explained that at this time the doctor is not comfortable sending her home and that she need to be admitted. Pt refused to sign stating she is not as risk leaving. This nurse attempted to get last set of vital signs, pt refused. While leaving pt yelled at son that "this nurse is trippin, she keeps playin with me saying Im leaving and there are risks, forget it lets just go"

## 2017-07-21 NOTE — Progress Notes (Signed)
Order for MBS received, however upon chart review, pt's MD indicated need for barium esophagram -  MD please cancel MBS and order esophagram if you agree. Radiology made aware of need for esophagram by this clinician.Thanks.   Luanna Salk, [facility] SLP  [phone]

## 2017-07-21 NOTE — ED Triage Notes (Addendum)
Pt arrives today with visitors stating that she was here last night, they wanted her to stay, and she was unable to stay, because she" had stuff to do."  Pt still c/o nausea today. A/Ox4. Ambulatory in triage. Pt states she wants to try and eat something, encouraged pt not to eat or drink until she is seen by a provider.

## 2017-07-21 NOTE — ED Notes (Signed)
Patient transported to CT 

## 2017-07-21 NOTE — Discharge Instructions (Signed)
Please establish care with a primary doctor Re-start your Coreg and Amlodipine Take Zofran for nausea/vomiting Please return to the ED at any time

## 2017-07-22 ENCOUNTER — Other Ambulatory Visit: Payer: Self-pay

## 2017-07-22 ENCOUNTER — Inpatient Hospital Stay (HOSPITAL_COMMUNITY): Payer: Medicare Other

## 2017-07-22 DIAGNOSIS — E440 Moderate protein-calorie malnutrition: Secondary | ICD-10-CM

## 2017-07-22 DIAGNOSIS — R0600 Dyspnea, unspecified: Secondary | ICD-10-CM

## 2017-07-22 LAB — COMPREHENSIVE METABOLIC PANEL
ALBUMIN: 2.4 g/dL — AB (ref 3.5–5.0)
AST: 9 U/L — AB (ref 15–41)
Alkaline Phosphatase: 170 U/L — ABNORMAL HIGH (ref 38–126)
Anion gap: 7 (ref 5–15)
BUN: 27 mg/dL — AB (ref 6–20)
CHLORIDE: 117 mmol/L — AB (ref 101–111)
CO2: 17 mmol/L — ABNORMAL LOW (ref 22–32)
CREATININE: 3.55 mg/dL — AB (ref 0.44–1.00)
Calcium: 7.9 mg/dL — ABNORMAL LOW (ref 8.9–10.3)
GFR calc Af Amer: 14 mL/min — ABNORMAL LOW (ref >60)
GFR calc non Af Amer: 12 mL/min — ABNORMAL LOW (ref >60)
GLUCOSE: 83 mg/dL (ref 65–99)
Potassium: 3.2 mmol/L — ABNORMAL LOW (ref 3.5–5.1)
SODIUM: 141 mmol/L (ref 135–145)
Total Bilirubin: 0.1 mg/dL — ABNORMAL LOW (ref 0.3–1.2)
Total Protein: 5.4 g/dL — ABNORMAL LOW (ref 6.5–8.1)

## 2017-07-22 LAB — CBC
HCT: 22.9 % — ABNORMAL LOW (ref 36.0–46.0)
Hemoglobin: 7.4 g/dL — ABNORMAL LOW (ref 12.0–15.0)
MCH: 28.9 pg (ref 26.0–34.0)
MCHC: 32.3 g/dL (ref 30.0–36.0)
MCV: 89.5 fL (ref 78.0–100.0)
PLATELETS: 279 10*3/uL (ref 150–400)
RBC: 2.56 MIL/uL — ABNORMAL LOW (ref 3.87–5.11)
RDW: 15.2 % (ref 11.5–15.5)
WBC: 4.8 10*3/uL (ref 4.0–10.5)

## 2017-07-22 LAB — TROPONIN I: Troponin I: 0.04 ng/mL (ref <0.03)

## 2017-07-22 LAB — MAGNESIUM: Magnesium: 1.6 mg/dL — ABNORMAL LOW (ref 1.7–2.4)

## 2017-07-22 LAB — ECHOCARDIOGRAM COMPLETE
Height: 65 in
Weight: 1682.55 oz

## 2017-07-22 MED ORDER — MAGNESIUM SULFATE IN D5W 1-5 GM/100ML-% IV SOLN
1.0000 g | Freq: Once | INTRAVENOUS | Status: AC
  Administered 2017-07-22: 1 g via INTRAVENOUS
  Filled 2017-07-22: qty 100

## 2017-07-22 MED ORDER — CLONIDINE HCL 0.1 MG PO TABS
0.2000 mg | ORAL_TABLET | Freq: Two times a day (BID) | ORAL | Status: DC
  Administered 2017-07-22 – 2017-07-25 (×6): 0.2 mg via ORAL
  Filled 2017-07-22 (×6): qty 2

## 2017-07-22 MED ORDER — POTASSIUM CHLORIDE IN NACL 40-0.9 MEQ/L-% IV SOLN
INTRAVENOUS | Status: DC
  Administered 2017-07-23 – 2017-07-25 (×3): 125 mL/h via INTRAVENOUS
  Filled 2017-07-22 (×6): qty 1000

## 2017-07-22 MED ORDER — PANTOPRAZOLE SODIUM 40 MG PO TBEC
40.0000 mg | DELAYED_RELEASE_TABLET | Freq: Every day | ORAL | Status: DC
  Administered 2017-07-24 – 2017-07-25 (×2): 40 mg via ORAL
  Filled 2017-07-22 (×3): qty 1

## 2017-07-22 NOTE — Consult Note (Addendum)
[facility] Consult: 2:25 PM 07/22/2017  LOS: 1 day    Referring Provider: Dr Allyson Sabal  Primary Care Physician:  Velna Ochs, MD [facility] internal medicine resident.   Primary Gastroenterologist:  unassigned     Reason for Consultation:  .     HPI: Stephanie Gross is a 72 y.o. female.  Hx CKD, stage 3.  CVAs.  Previously but no longer on Plavix.  HTN. CAD, drug-eluting stenting in 2007.  COPD.  DM2, not on meds at home.  Kidney stones.  Previous hysterectomy, cholecystectomy and orthopedic surgeries.  Hx medical non-compliance due to cost.    11/2013 EGD, for epig pain, FOB+, anemia.  Dr Henrene Pastor.  Mild esophagitis, gastric ulcer , gastric erosions,  Mild erosive duodenitis.  No corresponding pathology but clo-test/H pylori positive and she was prescribed a two-week course of Prevpac. 11/2013 Colonoscopy.  Sigmoid diverticulosis.  MD suggested repeat low risk screening study in 2025.   Not taking PPI etc.    Admitted with n/v, weight loss, hypertensive urgency likely due to non-compliance with meds.  AKI.  Low potassium and magnesium. For about 5 months she has had frequent episodes of postprandial nausea. She is not able to vomit spontaneously.  Sometimes she self induces vomiting, sticking her fingers down her throat, at which point she will produce a clear emesis and feels better.  She does not vomit the food she just tried to eat.  Although this does not happen every time she eats or drinks, it happens at least once a day and occurs with both solids and liquids.  She denies dysphagia, there is no sense that the food is getting stuck within the esophagus during transit.  She is not taking any acid controlling medications nor is she taking any NSAIDs or aspirin products.  She denies heartburn/indigestion.  She  denies abdominal pain.  There is no coffee grounds or blood in the emesis.  Stools are brown and occurr daily or sometimes every other day.  She does not consume alcoholic beverages and she never has had a significant intake of these.  Wt 51kg/113# in 11/2016, 47.7 kg/105# currently.  She says she weighed ~140# a year ago.    CT ab/pelvis/chest: Coronary and aortic aterosclerosis, sigmoid diverticulosis.  She went down for barium esophagram this morning but said she could not drink the contrast as some prior experience with drinking contrast made her have severe nausea. Hgb 8.4 >> 9.3 >> 8 >> 7.4.  MCV 89 PT/INR normal. Elevated alk phos 214 >> 170. Note it was elevated to 122 in 01/2012, 120 in 10/2013, 124 in 06/2015. Otherwise LFTs are at or below normal.  Only family history of GI illness is that of GER in her sister who takes PPI.  Of note, 3 family members were in the room during the interview and they have been advising her to seek medical attention it sounds like for the last few months but the patient was reluctant and only just yesterday presented to the emergency department.     Past Medical History:  Diagnosis Date  . Arthritis   . AVD (aortic valve disease)   . CAD (coronary artery disease)    s/p RCA stent 2007. Myoview 2010 normal  . Chronic obstructive bronchitis without exacerbation (HCC)   . 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 Degeneration of meniscus of knee   . Derangement of left knee   . Diabetes mellitus without complication (HCC)   . DM (diabetes mellitus) (HCC)   . DM (diabetes mellitus) type 2, uncontrolled, with ketoacidosis (HCC) 03/12/1994   HIGH RISK FEET.. Please have patient take shoes and socks off every visit for visual foot inspection.  NPDR [facility] CSDME on 09/2011  Qualifier: Diagnosis of  By: Enriquez MD, Veronique D.    . GERD (gastroesophageal reflux disease)   . HTN (hypertension)   . Hyperlipidemia   .  Hypertension   . Obesity   . Vitamin D deficiency     Past Surgical History:  Procedure Laterality Date  . ABDOMINAL HYSTERECTOMY    . CHOLECYSTECTOMY    . CORONARY ANGIOPLASTY WITH STENT PLACEMENT    . KNEE SURGERY    . SHOULDER SURGERY      Prior to Admission medications   Medication Sig Start Date End Date Taking? Authorizing Provider  aspirin EC 81 MG tablet Take 1 tablet (81 mg total) by mouth daily. 06/02/16  Yes Guilloud, Carolyn, MD  ondansetron (ZOFRAN ODT) 4 MG disintegrating tablet Take 1 tablet (4 mg total) by mouth every 8 (eight) hours as needed for nausea or vomiting. 07/21/17  Yes Gekas, Kelly Marie, PA-C  amLODipine (NORVASC) 10 MG tablet Take 1 tablet (10 mg total) by mouth daily. 06/02/16 06/02/17  Guilloud, Carolyn, MD  carvedilol (COREG) 25 MG tablet Take 1 tablet (25 mg total) by mouth 2 (two) times daily. 12/05/16 03/05/17  McAlhany, Christopher D, MD    Scheduled Meds: . amLODipine  10 mg Oral Daily  . aspirin EC  81 mg Oral Daily  . carvedilol  25 mg Oral BID  . cloNIDine  0.2 mg Oral BID  . heparin  5,000 Units Subcutaneous Q8H  . pantoprazole (PROTONIX) IV  40 mg Intravenous Q12H   Infusions: . 0.9 % NaCl with KCl 40 mEq / L 75 mL/hr (07/22/17 1356)  . magnesium sulfate 1 - 4 g bolus IVPB     PRN Meds: acetaminophen **OR** acetaminophen, hydrALAZINE, levalbuterol, ondansetron **OR** ondansetron (ZOFRAN) IV, ondansetron (ZOFRAN) IV, ondansetron   Allergies as of 07/21/2017 - Review Complete 07/21/2017  Allergen Reaction Noted  . Benazepril-hydrochlorothiazide Other (See Comments) 05/22/2014  . Chantix [varenicline]  02/13/2012  . Codeine Nausea And Vomiting   . Methylprednisolone Other (See Comments)   . Statins Other (See Comments) 11/07/2013    Family History  Problem Relation Age of Onset  . Stroke Mother   . Diabetes Mother   . Hypertension Mother   . Diabetes Maternal Grandmother   . Diabetes Sister     Social History   Socioeconomic  History  . Marital status: Single    Spouse name: Not on file  . Number of children: Not on file  . Years of education: Not on file  . Highest education level: Not on file  Occupational History  . Occupation: unemployed  Social Needs  . Financial resource strain: Not on file  . Food insecurity:    Worry: Not on file    Inability: Not on file  . Transportation needs:      Medical: Not on file    Non-medical: Not on file  Tobacco Use  . Smoking status: Current Every Day Smoker    Packs/day: 0.50    Years: 30.00    Pack years: 15.00    Types: Cigarettes  . Smokeless tobacco: Never Used  . Tobacco comment: . Started again 4-5 cigs./day  Substance and Sexual Activity  . Alcohol use: No    Alcohol/week: 0.0 oz  . Drug use: No  . Sexual activity: Not on file  Lifestyle  . Physical activity:    Days per week: Not on file    Minutes per session: Not on file  . Stress: Not on file  Relationships  . Social connections:    Talks on phone: Not on file    Gets together: Not on file    Attends religious service: Not on file    Active member of club or organization: Not on file    Attends meetings of clubs or organizations: Not on file    Relationship status: Not on file  . Intimate partner violence:    Fear of current or ex partner: Not on file    Emotionally abused: Not on file    Physically abused: Not on file    Forced sexual activity: Not on file  Other Topics Concern  . Not on file  Social History Narrative   ** Merged History Encounter **       Patient recently lost her job and is currently without insurance. - Nov 2012    REVIEW OF SYSTEMS: Constitutional: Some weakness and fatigue.  No falls. ENT:  No nose bleeds Pulm: No cough or shortness of breath. CV:  No palpitations, no LE edema.  No chest pain GU:  No hematuria, no frequency GI:  See HPI Heme: Denies unusual bleeding or excessive bruising. Transfusions: None Neuro:  No headaches, no peripheral tingling or  numbness Derm:  No itching, no rash or sores.  Endocrine:  No sweats or chills.  No polyuria or dysuria Immunization: Did not query as to recent immunizations but she has not been to a primary care doctor doctor in over 1 year. Travel:  None beyond local counties in last few months.    PHYSICAL EXAM: Vital signs in last 24 hours: Vitals:   07/22/17 0119 07/22/17 1349  BP: (!) 153/62 (!) 184/62  Pulse: 75 62  Resp: 13 16  Temp: 97.9 F (36.6 C) 98.2 F (36.8 C)  SpO2: 100% 100%   Wt Readings from Last 3 Encounters:  07/22/17 105 lb 2.6 oz (47.7 kg)  12/05/16 113 lb (51.3 kg)  06/02/16 121 lb 8 oz (55.1 kg)   General: Patient thin.  Not acutely ill looking.  Somewhat irritated by line of questioning and by family members rejoinders to my questions Head: No facial asymmetry or swelling.  No signs of head trauma. Eyes: No scleral icterus.  No conjunctival pallor.  EOMI. Ears: Not HOH. Nose: No discharge or congestion. Mouth: Oral mucosa moist, pink, clear.  Tongue midline. Neck: No JVD, no masses, no thyromegaly. Lungs: Clear to auscultation and percussion bilaterally.  No cough.  No dyspnea. Heart: RRR.  No MRG.  S1, S2 present. Abdomen: Soft, thin, not tender or distended.  Bowel sounds hypoactive but normal in quality.  No bruits, hernias, organomegaly..   Rectal: Deferred rectal exam. Musc/Skeltl: No joint erythema or swelling.  No significant deformities. Extremities: No CCE. Neurologic: Alert.  Oriented x3.  Moves all 4 limbs, strength not  tested.  No tremors.  No gross deficits. Skin: No obvious rashes, sores or suspicious lesions. Tattoos: None observed. Nodes: No cervical adenopathy. Psych: Overall seems a little fed up with being in the hospital but cooperative.  Intake/Output from previous day: 03/26 0701 - 03/27 0700 In: 561.7 [I.V.:561.7] Out: -  Intake/Output this shift: No intake/output data recorded.  LAB RESULTS: Recent Labs    07/21/17 1416  07/21/17 2305 07/22/17 0504  WBC 5.7 5.4 4.8  HGB 9.3* 8.0* 7.4*  HCT 28.9* 24.7* 22.9*  PLT 333 302 279   BMET Lab Results  Component Value Date   NA 141 07/22/2017   NA 139 07/21/2017   NA 141 07/20/2017   K 3.2 (L) 07/22/2017   K 3.1 (L) 07/21/2017   K 2.8 (L) 07/20/2017   CL 117 (H) 07/22/2017   CL 112 (H) 07/21/2017   CL 114 (H) 07/20/2017   CO2 17 (L) 07/22/2017   CO2 18 (L) 07/21/2017   CO2 18 (L) 07/20/2017   GLUCOSE 83 07/22/2017   GLUCOSE 122 (H) 07/21/2017   GLUCOSE 78 07/20/2017   BUN 27 (H) 07/22/2017   BUN 26 (H) 07/21/2017   BUN 28 (H) 07/20/2017   CREATININE 3.55 (H) 07/22/2017   CREATININE 3.51 (H) 07/21/2017   CREATININE 3.68 (H) 07/21/2017   CALCIUM 7.9 (L) 07/22/2017   CALCIUM 8.1 (L) 07/21/2017   CALCIUM 8.1 (L) 07/20/2017   LFT Recent Labs    07/20/17 2243 07/21/17 1416 07/22/17 0504  PROT 6.7 6.9 5.4*  ALBUMIN 3.2* 3.3* 2.4*  AST 11* 11* 9*  ALT 6* 7* <5*  ALKPHOS 208* 214* 170*  BILITOT 0.1* 0.3 0.1*   PT/INR Lab Results  Component Value Date   INR 0.90 02/07/2012   Hepatitis Panel No results for input(s): HEPBSAG, HCVAB, HEPAIGM, HEPBIGM in the last 72 hours. C-Diff No components found for: CDIFF Lipase     Component Value Date/Time   LIPASE 36 07/21/2017 1415    Drugs of Abuse     Component Value Date/Time   LABOPIA NONE DETECTED 02/07/2012 1205   COCAINSCRNUR NONE DETECTED 02/07/2012 1205   LABBENZ NONE DETECTED 02/07/2012 1205   AMPHETMU NONE DETECTED 02/07/2012 1205   THCU NONE DETECTED 02/07/2012 1205   LABBARB NONE DETECTED 02/07/2012 1205     RADIOLOGY STUDIES: Ct Abdomen Pelvis Wo Contrast  Result Date: 07/21/2017 CLINICAL DATA:  Nausea, unintentional weight loss. EXAM: CT CHEST, ABDOMEN AND PELVIS WITHOUT CONTRAST TECHNIQUE: Multidetector CT imaging of the chest, abdomen and pelvis was performed following the standard protocol without IV contrast. COMPARISON:  CT scan of November 06, 2008. FINDINGS: CT CHEST  FINDINGS Cardiovascular: Atherosclerosis of thoracic aorta is noted without aneurysm formation. Coronary artery calcifications are noted. Minimal pericardial effusion is noted. Normal cardiac size. Mediastinum/Nodes: No enlarged mediastinal, hilar, or axillary lymph nodes. Thyroid gland, trachea, and esophagus demonstrate no significant findings. Lungs/Pleura: Lungs are clear. No pleural effusion or pneumothorax. Musculoskeletal: No chest wall mass or suspicious bone lesions identified. CT ABDOMEN PELVIS FINDINGS Hepatobiliary: No focal liver abnormality is seen. Status post cholecystectomy. No biliary dilatation. Pancreas: Unremarkable. No pancreatic ductal dilatation or surrounding inflammatory changes. Spleen: Normal in size without focal abnormality. Adrenals/Urinary Tract: Adrenal glands are unremarkable. Kidneys are normal, without renal calculi, focal lesion, or hydronephrosis. Bladder is unremarkable. Stomach/Bowel: The stomach appears normal. There is no evidence of bowel obstruction or inflammation. Sigmoid diverticulosis is noted without inflammation. The appendix is not visualized. Vascular/Lymphatic: Aortic atherosclerosis. No enlarged abdominal or   pelvic lymph nodes. Reproductive: Status post hysterectomy. No adnexal masses. Other: No abdominal wall hernia or abnormality. No abdominopelvic ascites. Musculoskeletal: No acute or significant osseous findings. IMPRESSION: Coronary artery calcifications are noted suggesting coronary artery disease. Sigmoid diverticulosis without inflammation. Aortic Atherosclerosis (ICD10-I70.0). Electronically Signed   By: James  Green Jr, M.D.   On: 07/21/2017 19:30   Ct Chest Wo Contrast  Result Date: 07/21/2017 CLINICAL DATA:  Nausea, unintentional weight loss. EXAM: CT CHEST, ABDOMEN AND PELVIS WITHOUT CONTRAST TECHNIQUE: Multidetector CT imaging of the chest, abdomen and pelvis was performed following the standard protocol without IV contrast. COMPARISON:  CT scan  of November 06, 2008. FINDINGS: CT CHEST FINDINGS Cardiovascular: Atherosclerosis of thoracic aorta is noted without aneurysm formation. Coronary artery calcifications are noted. Minimal pericardial effusion is noted. Normal cardiac size. Mediastinum/Nodes: No enlarged mediastinal, hilar, or axillary lymph nodes. Thyroid gland, trachea, and esophagus demonstrate no significant findings. Lungs/Pleura: Lungs are clear. No pleural effusion or pneumothorax. Musculoskeletal: No chest wall mass or suspicious bone lesions identified. CT ABDOMEN PELVIS FINDINGS Hepatobiliary: No focal liver abnormality is seen. Status post cholecystectomy. No biliary dilatation. Pancreas: Unremarkable. No pancreatic ductal dilatation or surrounding inflammatory changes. Spleen: Normal in size without focal abnormality. Adrenals/Urinary Tract: Adrenal glands are unremarkable. Kidneys are normal, without renal calculi, focal lesion, or hydronephrosis. Bladder is unremarkable. Stomach/Bowel: The stomach appears normal. There is no evidence of bowel obstruction or inflammation. Sigmoid diverticulosis is noted without inflammation. The appendix is not visualized. Vascular/Lymphatic: Aortic atherosclerosis. No enlarged abdominal or pelvic lymph nodes. Reproductive: Status post hysterectomy. No adnexal masses. Other: No abdominal wall hernia or abnormality. No abdominopelvic ascites. Musculoskeletal: No acute or significant osseous findings. IMPRESSION: Coronary artery calcifications are noted suggesting coronary artery disease. Sigmoid diverticulosis without inflammation. Aortic Atherosclerosis (ICD10-I70.0). Electronically Signed   By: James  Green Jr, M.D.   On: 07/21/2017 19:30     IMPRESSION:   *   Postprandial nausea, vomiting with weight loss over several months.  Pt denies dysphagia.  Had gastric ulcer, esophago-gastro-duodenitis on endoscopy in 2015.   H. pylori was positive, unclear if she completed prescribed Prevpac.  Though  noncontrast CT scan is unrevealing, suspect recurrent/ongoing peptic ulcer disease.  *    Normocytic anemia.   No FOB or anemia profile testing thus far.     *   Elevated alkaline phosphatase.  This dates back to 2013.  It also predates her CKD/AKI.  Otherwise her LFTs are at or below normal.  *    Hypokalemia and hypomagnesemia.  Calcium corrected for low albumin is WNL.    *   AKI.    *  Protein calorie malnutrition, moderate severity per RD   PLAN:     *  EGD tomorrow, case d/w Dr Jacobs.  *  At this point po Protonix should work effectively, does not need IV formula, so I changed orders.    *  CBC in AM along with anemia studies.  If Hgb drops to or below 7, will need PRBCs. Stool FOB test ordered.   Increased NS with 40meq Kcl from 75 to 125/hour given not taking much po, will be NPO after midnight, AKI and hypokalemia.        Sarah Gribbin  07/22/2017, 2:25 PM Pager: [phone]    ________________________________________________________________________  [facility] MD note:  I personally examined the patient, reviewed the data and agree with the assessment and plan described above.  H/o H. Pylori, now with dyspepsia, weight loss, vomiting.  Planning   on EGD today.   Daniel Jacobs, MD [facility] Pager [phone]  

## 2017-07-22 NOTE — H&P (View-Only) (Signed)
[facility] Consult: 2:25 PM 07/22/2017  LOS: 1 day    Referring Provider: Dr Allyson Sabal  Primary Care Physician:  Velna Ochs, MD [facility] internal medicine resident.   Primary Gastroenterologist:  unassigned     Reason for Consultation:  .     HPI: Stephanie Gross is a 72 y.o. female.  Hx CKD, stage 3.  CVAs.  Previously but no longer on Plavix.  HTN. CAD, drug-eluting stenting in 2007.  COPD.  DM2, not on meds at home.  Kidney stones.  Previous hysterectomy, cholecystectomy and orthopedic surgeries.  Hx medical non-compliance due to cost.    11/2013 EGD, for epig pain, FOB+, anemia.  Dr Henrene Pastor.  Mild esophagitis, gastric ulcer , gastric erosions,  Mild erosive duodenitis.  No corresponding pathology but clo-test/H pylori positive and she was prescribed a two-week course of Prevpac. 11/2013 Colonoscopy.  Sigmoid diverticulosis.  MD suggested repeat low risk screening study in 2025.   Not taking PPI etc.    Admitted with n/v, weight loss, hypertensive urgency likely due to non-compliance with meds.  AKI.  Low potassium and magnesium. For about 5 months she has had frequent episodes of postprandial nausea. She is not able to vomit spontaneously.  Sometimes she self induces vomiting, sticking her fingers down her throat, at which point she will produce a clear emesis and feels better.  She does not vomit the food she just tried to eat.  Although this does not happen every time she eats or drinks, it happens at least once a day and occurs with both solids and liquids.  She denies dysphagia, there is no sense that the food is getting stuck within the esophagus during transit.  She is not taking any acid controlling medications nor is she taking any NSAIDs or aspirin products.  She denies heartburn/indigestion.  She  denies abdominal pain.  There is no coffee grounds or blood in the emesis.  Stools are brown and occurr daily or sometimes every other day.  She does not consume alcoholic beverages and she never has had a significant intake of these.  Wt 51kg/113# in 11/2016, 47.7 kg/105# currently.  She says she weighed ~140# a year ago.    CT ab/pelvis/chest: Coronary and aortic aterosclerosis, sigmoid diverticulosis.  She went down for barium esophagram this morning but said she could not drink the contrast as some prior experience with drinking contrast made her have severe nausea. Hgb 8.4 >> 9.3 >> 8 >> 7.4.  MCV 89 PT/INR normal. Elevated alk phos 214 >> 170. Note it was elevated to 122 in 01/2012, 120 in 10/2013, 124 in 06/2015. Otherwise LFTs are at or below normal.  Only family history of GI illness is that of GER in her sister who takes PPI.  Of note, 3 family members were in the room during the interview and they have been advising her to seek medical attention it sounds like for the last few months but the patient was reluctant and only just yesterday presented to the emergency department.     Past Medical History:  Diagnosis Date  . Arthritis   . AVD (aortic valve disease)   . CAD (coronary artery disease)    s/p RCA stent 2007. Myoview 2010 normal  . Chronic obstructive bronchitis without exacerbation (HCC)   . 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 Degeneration of meniscus of knee   . Derangement of left knee   . Diabetes mellitus without complication (HCC)   . DM (diabetes mellitus) (HCC)   . DM (diabetes mellitus) type 2, uncontrolled, with ketoacidosis (HCC) 03/12/1994   HIGH RISK FEET.. Please have patient take shoes and socks off every visit for visual foot inspection.  NPDR [facility] CSDME on 09/2011  Qualifier: Diagnosis of  By: Enriquez MD, Veronique D.    . GERD (gastroesophageal reflux disease)   . HTN (hypertension)   . Hyperlipidemia   .  Hypertension   . Obesity   . Vitamin D deficiency     Past Surgical History:  Procedure Laterality Date  . ABDOMINAL HYSTERECTOMY    . CHOLECYSTECTOMY    . CORONARY ANGIOPLASTY WITH STENT PLACEMENT    . KNEE SURGERY    . SHOULDER SURGERY      Prior to Admission medications   Medication Sig Start Date End Date Taking? Authorizing Provider  aspirin EC 81 MG tablet Take 1 tablet (81 mg total) by mouth daily. 06/02/16  Yes Guilloud, Carolyn, MD  ondansetron (ZOFRAN ODT) 4 MG disintegrating tablet Take 1 tablet (4 mg total) by mouth every 8 (eight) hours as needed for nausea or vomiting. 07/21/17  Yes Gekas, Kelly Marie, PA-C  amLODipine (NORVASC) 10 MG tablet Take 1 tablet (10 mg total) by mouth daily. 06/02/16 06/02/17  Guilloud, Carolyn, MD  carvedilol (COREG) 25 MG tablet Take 1 tablet (25 mg total) by mouth 2 (two) times daily. 12/05/16 03/05/17  McAlhany, Christopher D, MD    Scheduled Meds: . amLODipine  10 mg Oral Daily  . aspirin EC  81 mg Oral Daily  . carvedilol  25 mg Oral BID  . cloNIDine  0.2 mg Oral BID  . heparin  5,000 Units Subcutaneous Q8H  . pantoprazole (PROTONIX) IV  40 mg Intravenous Q12H   Infusions: . 0.9 % NaCl with KCl 40 mEq / L 75 mL/hr (07/22/17 1356)  . magnesium sulfate 1 - 4 g bolus IVPB     PRN Meds: acetaminophen **OR** acetaminophen, hydrALAZINE, levalbuterol, ondansetron **OR** ondansetron (ZOFRAN) IV, ondansetron (ZOFRAN) IV, ondansetron   Allergies as of 07/21/2017 - Review Complete 07/21/2017  Allergen Reaction Noted  . Benazepril-hydrochlorothiazide Other (See Comments) 05/22/2014  . Chantix [varenicline]  02/13/2012  . Codeine Nausea And Vomiting   . Methylprednisolone Other (See Comments)   . Statins Other (See Comments) 11/07/2013    Family History  Problem Relation Age of Onset  . Stroke Mother   . Diabetes Mother   . Hypertension Mother   . Diabetes Maternal Grandmother   . Diabetes Sister     Social History   Socioeconomic  History  . Marital status: Single    Spouse name: Not on file  . Number of children: Not on file  . Years of education: Not on file  . Highest education level: Not on file  Occupational History  . Occupation: unemployed  Social Needs  . Financial resource strain: Not on file  . Food insecurity:    Worry: Not on file    Inability: Not on file  . Transportation needs:      Medical: Not on file    Non-medical: Not on file  Tobacco Use  . Smoking status: Current Every Day Smoker    Packs/day: 0.50    Years: 30.00    Pack years: 15.00    Types: Cigarettes  . Smokeless tobacco: Never Used  . Tobacco comment: . Started again 4-5 cigs./day  Substance and Sexual Activity  . Alcohol use: No    Alcohol/week: 0.0 oz  . Drug use: No  . Sexual activity: Not on file  Lifestyle  . Physical activity:    Days per week: Not on file    Minutes per session: Not on file  . Stress: Not on file  Relationships  . Social connections:    Talks on phone: Not on file    Gets together: Not on file    Attends religious service: Not on file    Active member of club or organization: Not on file    Attends meetings of clubs or organizations: Not on file    Relationship status: Not on file  . Intimate partner violence:    Fear of current or ex partner: Not on file    Emotionally abused: Not on file    Physically abused: Not on file    Forced sexual activity: Not on file  Other Topics Concern  . Not on file  Social History Narrative   ** Merged History Encounter **       Patient recently lost her job and is currently without insurance. - Nov 2012    REVIEW OF SYSTEMS: Constitutional: Some weakness and fatigue.  No falls. ENT:  No nose bleeds Pulm: No cough or shortness of breath. CV:  No palpitations, no LE edema.  No chest pain GU:  No hematuria, no frequency GI:  See HPI Heme: Denies unusual bleeding or excessive bruising. Transfusions: None Neuro:  No headaches, no peripheral tingling or  numbness Derm:  No itching, no rash or sores.  Endocrine:  No sweats or chills.  No polyuria or dysuria Immunization: Did not query as to recent immunizations but she has not been to a primary care doctor doctor in over 1 year. Travel:  None beyond local counties in last few months.    PHYSICAL EXAM: Vital signs in last 24 hours: Vitals:   07/22/17 0119 07/22/17 1349  BP: (!) 153/62 (!) 184/62  Pulse: 75 62  Resp: 13 16  Temp: 97.9 F (36.6 C) 98.2 F (36.8 C)  SpO2: 100% 100%   Wt Readings from Last 3 Encounters:  07/22/17 105 lb 2.6 oz (47.7 kg)  12/05/16 113 lb (51.3 kg)  06/02/16 121 lb 8 oz (55.1 kg)   General: Patient thin.  Not acutely ill looking.  Somewhat irritated by line of questioning and by family members rejoinders to my questions Head: No facial asymmetry or swelling.  No signs of head trauma. Eyes: No scleral icterus.  No conjunctival pallor.  EOMI. Ears: Not HOH. Nose: No discharge or congestion. Mouth: Oral mucosa moist, pink, clear.  Tongue midline. Neck: No JVD, no masses, no thyromegaly. Lungs: Clear to auscultation and percussion bilaterally.  No cough.  No dyspnea. Heart: RRR.  No MRG.  S1, S2 present. Abdomen: Soft, thin, not tender or distended.  Bowel sounds hypoactive but normal in quality.  No bruits, hernias, organomegaly..   Rectal: Deferred rectal exam. Musc/Skeltl: No joint erythema or swelling.  No significant deformities. Extremities: No CCE. Neurologic: Alert.  Oriented x3.  Moves all 4 limbs, strength not   tested.  No tremors.  No gross deficits. Skin: No obvious rashes, sores or suspicious lesions. Tattoos: None observed. Nodes: No cervical adenopathy. Psych: Overall seems a little fed up with being in the hospital but cooperative.  Intake/Output from previous day: 03/26 0701 - 03/27 0700 In: 561.7 [I.V.:561.7] Out: -  Intake/Output this shift: No intake/output data recorded.  LAB RESULTS: Recent Labs    07/21/17 1416  07/21/17 2305 07/22/17 0504  WBC 5.7 5.4 4.8  HGB 9.3* 8.0* 7.4*  HCT 28.9* 24.7* 22.9*  PLT 333 302 279   BMET Lab Results  Component Value Date   NA 141 07/22/2017   NA 139 07/21/2017   NA 141 07/20/2017   K 3.2 (L) 07/22/2017   K 3.1 (L) 07/21/2017   K 2.8 (L) 07/20/2017   CL 117 (H) 07/22/2017   CL 112 (H) 07/21/2017   CL 114 (H) 07/20/2017   CO2 17 (L) 07/22/2017   CO2 18 (L) 07/21/2017   CO2 18 (L) 07/20/2017   GLUCOSE 83 07/22/2017   GLUCOSE 122 (H) 07/21/2017   GLUCOSE 78 07/20/2017   BUN 27 (H) 07/22/2017   BUN 26 (H) 07/21/2017   BUN 28 (H) 07/20/2017   CREATININE 3.55 (H) 07/22/2017   CREATININE 3.51 (H) 07/21/2017   CREATININE 3.68 (H) 07/21/2017   CALCIUM 7.9 (L) 07/22/2017   CALCIUM 8.1 (L) 07/21/2017   CALCIUM 8.1 (L) 07/20/2017   LFT Recent Labs    07/20/17 2243 07/21/17 1416 07/22/17 0504  PROT 6.7 6.9 5.4*  ALBUMIN 3.2* 3.3* 2.4*  AST 11* 11* 9*  ALT 6* 7* <5*  ALKPHOS 208* 214* 170*  BILITOT 0.1* 0.3 0.1*   PT/INR Lab Results  Component Value Date   INR 0.90 02/07/2012   Hepatitis Panel No results for input(s): HEPBSAG, HCVAB, HEPAIGM, HEPBIGM in the last 72 hours. C-Diff No components found for: CDIFF Lipase     Component Value Date/Time   LIPASE 36 07/21/2017 1415    Drugs of Abuse     Component Value Date/Time   LABOPIA NONE DETECTED 02/07/2012 1205   COCAINSCRNUR NONE DETECTED 02/07/2012 1205   LABBENZ NONE DETECTED 02/07/2012 1205   AMPHETMU NONE DETECTED 02/07/2012 1205   THCU NONE DETECTED 02/07/2012 1205   LABBARB NONE DETECTED 02/07/2012 1205     RADIOLOGY STUDIES: Ct Abdomen Pelvis Wo Contrast  Result Date: 07/21/2017 CLINICAL DATA:  Nausea, unintentional weight loss. EXAM: CT CHEST, ABDOMEN AND PELVIS WITHOUT CONTRAST TECHNIQUE: Multidetector CT imaging of the chest, abdomen and pelvis was performed following the standard protocol without IV contrast. COMPARISON:  CT scan of November 06, 2008. FINDINGS: CT CHEST  FINDINGS Cardiovascular: Atherosclerosis of thoracic aorta is noted without aneurysm formation. Coronary artery calcifications are noted. Minimal pericardial effusion is noted. Normal cardiac size. Mediastinum/Nodes: No enlarged mediastinal, hilar, or axillary lymph nodes. Thyroid gland, trachea, and esophagus demonstrate no significant findings. Lungs/Pleura: Lungs are clear. No pleural effusion or pneumothorax. Musculoskeletal: No chest wall mass or suspicious bone lesions identified. CT ABDOMEN PELVIS FINDINGS Hepatobiliary: No focal liver abnormality is seen. Status post cholecystectomy. No biliary dilatation. Pancreas: Unremarkable. No pancreatic ductal dilatation or surrounding inflammatory changes. Spleen: Normal in size without focal abnormality. Adrenals/Urinary Tract: Adrenal glands are unremarkable. Kidneys are normal, without renal calculi, focal lesion, or hydronephrosis. Bladder is unremarkable. Stomach/Bowel: The stomach appears normal. There is no evidence of bowel obstruction or inflammation. Sigmoid diverticulosis is noted without inflammation. The appendix is not visualized. Vascular/Lymphatic: Aortic atherosclerosis. No enlarged abdominal or   pelvic lymph nodes. Reproductive: Status post hysterectomy. No adnexal masses. Other: No abdominal wall hernia or abnormality. No abdominopelvic ascites. Musculoskeletal: No acute or significant osseous findings. IMPRESSION: Coronary artery calcifications are noted suggesting coronary artery disease. Sigmoid diverticulosis without inflammation. Aortic Atherosclerosis (ICD10-I70.0). Electronically Signed   By: James  Green Jr, M.D.   On: 07/21/2017 19:30   Ct Chest Wo Contrast  Result Date: 07/21/2017 CLINICAL DATA:  Nausea, unintentional weight loss. EXAM: CT CHEST, ABDOMEN AND PELVIS WITHOUT CONTRAST TECHNIQUE: Multidetector CT imaging of the chest, abdomen and pelvis was performed following the standard protocol without IV contrast. COMPARISON:  CT scan  of November 06, 2008. FINDINGS: CT CHEST FINDINGS Cardiovascular: Atherosclerosis of thoracic aorta is noted without aneurysm formation. Coronary artery calcifications are noted. Minimal pericardial effusion is noted. Normal cardiac size. Mediastinum/Nodes: No enlarged mediastinal, hilar, or axillary lymph nodes. Thyroid gland, trachea, and esophagus demonstrate no significant findings. Lungs/Pleura: Lungs are clear. No pleural effusion or pneumothorax. Musculoskeletal: No chest wall mass or suspicious bone lesions identified. CT ABDOMEN PELVIS FINDINGS Hepatobiliary: No focal liver abnormality is seen. Status post cholecystectomy. No biliary dilatation. Pancreas: Unremarkable. No pancreatic ductal dilatation or surrounding inflammatory changes. Spleen: Normal in size without focal abnormality. Adrenals/Urinary Tract: Adrenal glands are unremarkable. Kidneys are normal, without renal calculi, focal lesion, or hydronephrosis. Bladder is unremarkable. Stomach/Bowel: The stomach appears normal. There is no evidence of bowel obstruction or inflammation. Sigmoid diverticulosis is noted without inflammation. The appendix is not visualized. Vascular/Lymphatic: Aortic atherosclerosis. No enlarged abdominal or pelvic lymph nodes. Reproductive: Status post hysterectomy. No adnexal masses. Other: No abdominal wall hernia or abnormality. No abdominopelvic ascites. Musculoskeletal: No acute or significant osseous findings. IMPRESSION: Coronary artery calcifications are noted suggesting coronary artery disease. Sigmoid diverticulosis without inflammation. Aortic Atherosclerosis (ICD10-I70.0). Electronically Signed   By: James  Green Jr, M.D.   On: 07/21/2017 19:30     IMPRESSION:   *   Postprandial nausea, vomiting with weight loss over several months.  Pt denies dysphagia.  Had gastric ulcer, esophago-gastro-duodenitis on endoscopy in 2015.   H. pylori was positive, unclear if she completed prescribed Prevpac.  Though  noncontrast CT scan is unrevealing, suspect recurrent/ongoing peptic ulcer disease.  *    Normocytic anemia.   No FOB or anemia profile testing thus far.     *   Elevated alkaline phosphatase.  This dates back to 2013.  It also predates her CKD/AKI.  Otherwise her LFTs are at or below normal.  *    Hypokalemia and hypomagnesemia.  Calcium corrected for low albumin is WNL.    *   AKI.    *  Protein calorie malnutrition, moderate severity per RD   PLAN:     *  EGD tomorrow, case d/w Dr Honore Wipperfurth.  *  At this point po Protonix should work effectively, does not need IV formula, so I changed orders.    *  CBC in AM along with anemia studies.  If Hgb drops to or below 7, will need PRBCs. Stool FOB test ordered.   Increased NS with 40meq Kcl from 75 to 125/hour given not taking much po, will be NPO after midnight, AKI and hypokalemia.        Stephanie Gross  07/22/2017, 2:25 PM Pager: [phone]    ________________________________________________________________________  [facility] MD note:  I personally examined the patient, reviewed the data and agree with the assessment and plan described above.  H/o H. Pylori, now with dyspepsia, weight loss, vomiting.  Planning   on EGD today.   Stephanie San, MD [facility] Pager [phone]  

## 2017-07-22 NOTE — Progress Notes (Signed)
Initial Nutrition Assessment  DOCUMENTATION CODES:   Non-severe (moderate) malnutrition in context of chronic illness, Underweight  INTERVENTION:   Will provide nutrition intervention recommendation as medically appropriate pending results of barium esophagram.  If patient is advanced to a diet that allows for thin liquids, recommend:  Unjury po TID with meals, each supplement provides 100 calories and 21 grams protein   NUTRITION DIAGNOSIS:   Moderate Malnutrition related to chronic illness(CKD; uncontrolled T2DM; dysphagia) as evidenced by  mild-moderate fat depletion, moderate muscle depletion.   GOAL:   Patient will meet greater than or equal to 90% of their needs   MONITOR:   Diet advancement, Labs, Weight trends  REASON FOR ASSESSMENT:   Malnutrition Screening Tool    ASSESSMENT:   72 yo female admitted on  3/26 with hypertensive emergency, acute renal failure, severe hypokalemia/hypomagnesemia, ketoacidosis in the setting of uncontrolled T2DM, intractable nausea, dysphagia, weight loss. History significant for CKD3, CAD with HTN, CVA, tobacco abuse.  On 3/26 per MD progress note:   In addition to primary diagnoses noted above, pt also risk of CKD-related vitamin D deficiency   Order has been placed for a barium esophagram  Spoke with pt:  Reports she is hungry since she is currently NPO; has not eaten since Sunday  Denies issues with swallowing and is frustrated that she remains NPO.  Endorses history of unintended weight loss in the past year due to early satiety; she did not know her UBW, but stated that she was "a size 14 a year ago and now a size 8"  Pt says she eats "small meals" consisting of only a couple bites throughout the day. If she eats more than a few bites she "has to stick her fingers down her throat" because she is uncomfortably full. Also reports that this allows her to "eat more"  The importance of eating adequate calories and quality  protein was discussed with pt/ Strategies for increasing calories and protein were reviewed with a focus on low-phosphorus options.  Pt stated that she does not like Ensure-like ONS because they are "too thick" and make her feel nauseous and throw up; she was agreeable to trying CLD ONS options since they are not thick. Pt mentioned that she considered trying [employer] since someone recommended it to her.  Pt also reports that she does not like creamy, milky foods (ice cream and milk make her vomit)  Non-significant weight loss based on wt history in medical record of 7% in past 7 months; 13.4% loss in past 13 months:  Wt Readings from Last 15 Encounters:  07/22/17 105 lb 2.6 oz (47.7 kg)  12/05/16 113 lb (51.3 kg)  06/02/16 121 lb 8 oz (55.1 kg)  01/27/16 129 lb 5 oz (58.7 kg)  10/02/15 133 lb 12.8 oz (60.7 kg)  09/10/15 133 lb 9.6 oz (60.6 kg)  08/06/15 133 lb 6.4 oz (60.5 kg)  07/04/15 133 lb 4.8 oz (60.5 kg)  06/27/15 132 lb 1.6 oz (59.9 kg)  06/04/15 134 lb 1.6 oz (60.8 kg)  05/23/15 135 lb 12.8 oz (61.6 kg)  05/21/15 137 lb 4.8 oz (62.3 kg)  01/11/15 139 lb 1.6 oz (63.1 kg)  12/28/14 136 lb 12.8 oz (62.1 kg)  12/19/14 134 lb (60.8 kg)   Medications: protonix, IV NaCl @ 75 mL/hr, MgS   Labs: K 3.2 L, Ca 7.9 L, Mg 1.6 L,    NUTRITION - FOCUSED PHYSICAL EXAM:    Most Recent Value  Orbital Region  No depletion  Upper Arm Region  Moderate depletion  Thoracic and Lumbar Region  Mild depletion  Buccal Region  No depletion  Temple Region  Moderate depletion  Clavicle Bone Region  Mild depletion  Clavicle and Acromion Bone Region  Moderate depletion  Scapular Bone Region  No depletion  Dorsal Hand  Mild depletion  Patellar Region  Moderate depletion  Anterior Thigh Region  Moderate depletion  Posterior Calf Region  Moderate depletion  Edema (RD Assessment)  None  Hair  Reviewed  Eyes  Reviewed  Mouth  Reviewed  Skin  Reviewed  Nails  Reviewed        Diet Order:  DIET SOFT Room service appropriate? Yes; Fluid consistency: Thin  EDUCATION NEEDS:   Education needs have been addressed  Skin:  Skin Assessment: Reviewed RN Assessment    Last BM:  3/26  Height:   Ht Readings from Last 1 Encounters:  07/22/17 5\' 5"  (1.651 m)    Weight:   Wt Readings from Last 1 Encounters:  07/22/17 105 lb 2.6 oz (47.7 kg)    Ideal Body Weight:  56.8 kg  BMI:  Body mass index is 17.5 kg/m.  Estimated Nutritional Needs:   Kcal:  1260-1460 (MSJ *1.25-1.4 rounded)  Protein:  63-73 grams (20% kcal)  Fluid:  >=1.4 L    Edmonia Lynch Dietetic Intern Pager: [phone]

## 2017-07-22 NOTE — Progress Notes (Signed)
Patient admitted to room 1516 from ED. Patient is A&Ox4. Patient has left AC IV site, flushed without issues. Patient skin intact. Patient oriented to room, call bell, bed use. Patient advised that she is NPO and pt verbalized understanding. Will ct monitor.

## 2017-07-22 NOTE — Progress Notes (Addendum)
[facility] PROGRESS NOTE  Stephanie SWARTZENTRUBER XNA:[id number] DOB: 11-25-1945 DOA: 07/21/2017   PCP: Velna Ochs, MD     Assessment/Plan: Active Problems:   DM (diabetes mellitus) type 2, uncontrolled, with ketoacidosis ([place])   Hyperlipidemia associated with type 2 diabetes mellitus ([place])   Uncontrolled hypertension   CKD (chronic kidney disease) stage 3, GFR 30-59 ml/min ([facility])   Acute renal failure (ARF) ([place])   72 year old female with a history of coronary artery disease, diabetes, CK D stage III, hypertension, dyslipidemia, tobacco abuse [quit smoking several weeks ago] previously followed by Kimberlee Nearing presents today with poor appetite, weight loss, progressive dysphagia to solids. Next  Assessment and plan  Hypertensive urgency Suspect due to noncompliance Patient is unsure if she takes Norvasc and Coreg however these were reordered,also started patient on clonidine Prn hydralazine as needed continue telemetry Flat  cardiac enzymes 2-D echo to ensure that her ejection fraction is unchanged, wall motion is normal  Acute on chronic renal failure,stage 4 now ,  baseline creatinine around 1.21from 3 years ago, anemia of chronic disease Suspect slowly progressive chronic kidney disease due to poorly managed diabetes as well as hypertension. No hydronephrosis on CT. Slight improvement with IV hydration No symptoms and signs of uremia Will need outpatient nephrology referral prior to discharge  Severe hypokalemia and hypomagnesemia Repleted   History of  Coronary artery disease, Continue aspirin and Coreg Cycle troponins are flat Patient states that she quit smoking 2-D echo to rule out wall motion abnormalities Hemoglobin A1c 5.2 and lipid panel LDL 100  Intractable nausea, dysphagia, weight loss CT chest abdomen pelvis negative for malignancy Patient declined Barium esophagogram She may need an endoscopy due to her weight loss,  gastroenterology has been consulted She is currently on a PPI  Diabetes mellitus type 2, not active  Hemoglobin A1c 5.2 Currently on no medications at home  Elevated alkaline phosphatase  TSH  0.77 and  25 hydroxy vitamin D levels pending   DVT prophylaxsis heparin  Code Status:  Full code   Family Communication: Discussed in detail with the patient, all imaging results, lab results explained to the patient   Disposition Plan:  Pending GI workup   Consultants: gastroenterology   Procedures:  none  Antibiotics: Anti-infectives (From admission, onward)   None       HPI/Subjective: Refused barium esophagogram, upset and would like to eat. She was started on a soft diet and she is tolerating her chicken sandwich  Objective: Vitals:   07/21/17 2245 07/21/17 2300 07/22/17 0119 07/22/17 1349  BP: (!) 159/70 (!) 158/73 (!) 153/62 (!) 184/62  Pulse: 80 79 75 62  Resp: 12 14 13 16   Temp:   97.9 F (36.6 C) 98.2 F (36.8 C)  TempSrc:   Oral Oral  SpO2: 98% 97% 100% 100%  Weight:   47.7 kg (105 lb 2.6 oz)   Height:   5\' 5"  (1.651 m)     Intake/Output Summary (Last 24 hours) at 07/22/2017 1408 Last data filed at 07/22/2017 5427 Gross per 24 hour  Intake 561.67 ml  Output -  Net 561.67 ml    Exam:  Examination:  General exam: Appears calm and comfortable  Respiratory system: Clear to auscultation. Respiratory effort normal. Cardiovascular system: S1 & S2 heard, RRR. No JVD, murmurs, rubs, gallops or clicks. No pedal edema. Gastrointestinal system: Abdomen is nondistended, soft and nontender. No organomegaly or masses felt. Normal bowel sounds heard. Central nervous system: Alert  and oriented. No focal neurological deficits. Extremities: Symmetric 5 x 5 power. Skin: No rashes, lesions or ulcers Psychiatry: Judgement and insight appear normal. Mood & affect appropriate.     Data Reviewed: I have personally reviewed following labs and imaging  studies  Micro Results No results found for this or any previous visit (from the past 240 hour(s)).  Radiology Reports Ct Abdomen Pelvis Wo Contrast  Result Date: 07/21/2017 CLINICAL DATA:  Nausea, unintentional weight loss. EXAM: CT CHEST, ABDOMEN AND PELVIS WITHOUT CONTRAST TECHNIQUE: Multidetector CT imaging of the chest, abdomen and pelvis was performed following the standard protocol without IV contrast. COMPARISON:  CT scan of November 06, 2008. FINDINGS: CT CHEST FINDINGS Cardiovascular: Atherosclerosis of thoracic aorta is noted without aneurysm formation. Coronary artery calcifications are noted. Minimal pericardial effusion is noted. Normal cardiac size. Mediastinum/Nodes: No enlarged mediastinal, hilar, or axillary lymph nodes. Thyroid gland, trachea, and esophagus demonstrate no significant findings. Lungs/Pleura: Lungs are clear. No pleural effusion or pneumothorax. Musculoskeletal: No chest wall mass or suspicious bone lesions identified. CT ABDOMEN PELVIS FINDINGS Hepatobiliary: No focal liver abnormality is seen. Status post cholecystectomy. No biliary dilatation. Pancreas: Unremarkable. No pancreatic ductal dilatation or surrounding inflammatory changes. Spleen: Normal in size without focal abnormality. Adrenals/Urinary Tract: Adrenal glands are unremarkable. Kidneys are normal, without renal calculi, focal lesion, or hydronephrosis. Bladder is unremarkable. Stomach/Bowel: The stomach appears normal. There is no evidence of bowel obstruction or inflammation. Sigmoid diverticulosis is noted without inflammation. The appendix is not visualized. Vascular/Lymphatic: Aortic atherosclerosis. No enlarged abdominal or pelvic lymph nodes. Reproductive: Status post hysterectomy. No adnexal masses. Other: No abdominal wall hernia or abnormality. No abdominopelvic ascites. Musculoskeletal: No acute or significant osseous findings. IMPRESSION: Coronary artery calcifications are noted suggesting coronary  artery disease. Sigmoid diverticulosis without inflammation. Aortic Atherosclerosis (ICD10-I70.0). Electronically Signed   By: Marijo Conception, M.D.   On: 07/21/2017 19:30   Ct Chest Wo Contrast  Result Date: 07/21/2017 CLINICAL DATA:  Nausea, unintentional weight loss. EXAM: CT CHEST, ABDOMEN AND PELVIS WITHOUT CONTRAST TECHNIQUE: Multidetector CT imaging of the chest, abdomen and pelvis was performed following the standard protocol without IV contrast. COMPARISON:  CT scan of November 06, 2008. FINDINGS: CT CHEST FINDINGS Cardiovascular: Atherosclerosis of thoracic aorta is noted without aneurysm formation. Coronary artery calcifications are noted. Minimal pericardial effusion is noted. Normal cardiac size. Mediastinum/Nodes: No enlarged mediastinal, hilar, or axillary lymph nodes. Thyroid gland, trachea, and esophagus demonstrate no significant findings. Lungs/Pleura: Lungs are clear. No pleural effusion or pneumothorax. Musculoskeletal: No chest wall mass or suspicious bone lesions identified. CT ABDOMEN PELVIS FINDINGS Hepatobiliary: No focal liver abnormality is seen. Status post cholecystectomy. No biliary dilatation. Pancreas: Unremarkable. No pancreatic ductal dilatation or surrounding inflammatory changes. Spleen: Normal in size without focal abnormality. Adrenals/Urinary Tract: Adrenal glands are unremarkable. Kidneys are normal, without renal calculi, focal lesion, or hydronephrosis. Bladder is unremarkable. Stomach/Bowel: The stomach appears normal. There is no evidence of bowel obstruction or inflammation. Sigmoid diverticulosis is noted without inflammation. The appendix is not visualized. Vascular/Lymphatic: Aortic atherosclerosis. No enlarged abdominal or pelvic lymph nodes. Reproductive: Status post hysterectomy. No adnexal masses. Other: No abdominal wall hernia or abnormality. No abdominopelvic ascites. Musculoskeletal: No acute or significant osseous findings. IMPRESSION: Coronary artery  calcifications are noted suggesting coronary artery disease. Sigmoid diverticulosis without inflammation. Aortic Atherosclerosis (ICD10-I70.0). Electronically Signed   By: Marijo Conception, M.D.   On: 07/21/2017 19:30     CBC Recent Labs  Lab 07/20/17 2243 07/21/17 1416 07/21/17 2305  07/22/17 0504  WBC 5.7 5.7 5.4 4.8  HGB 8.4* 9.3* 8.0* 7.4*  HCT 26.3* 28.9* 24.7* 22.9*  PLT 326 333 302 279  MCV 88.9 88.4 87.9 89.5  MCH 28.4 28.4 28.5 28.9  MCHC 31.9 32.2 32.4 32.3  RDW 14.9 14.8 14.8 15.2  LYMPHSABS 1.9  --   --   --   MONOABS 0.4  --   --   --   EOSABS 0.2  --   --   --   BASOSABS 0.0  --   --   --     Chemistries  Recent Labs  Lab 07/20/17 2243 07/21/17 1415 07/21/17 1416 07/21/17 2305 07/22/17 0504  NA 141  --  139  --  141  K 2.8*  --  3.1*  --  3.2*  CL 114*  --  112*  --  117*  CO2 18*  --  18*  --  17*  GLUCOSE 78  --  122*  --  83  BUN 28*  --  26*  --  27*  CREATININE 3.73*  --  3.68* 3.51* 3.55*  CALCIUM 8.1*  --  8.1*  --  7.9*  MG  --  1.1*  --   --  1.6*  AST 11*  --  11*  --  9*  ALT 6*  --  7*  --  <5*  ALKPHOS 208*  --  214*  --  170*  BILITOT 0.1*  --  0.3  --  0.1*   ------------------------------------------------------------------------------------------------------------------ estimated creatinine clearance is 11.3 mL/min (A) (by C-G formula based on SCr of 3.55 mg/dL (H)). ------------------------------------------------------------------------------------------------------------------ Recent Labs    07/21/17 1416  HGBA1C 5.2   ------------------------------------------------------------------------------------------------------------------ Recent Labs    07/21/17 1415  CHOL 183  HDL 57  LDLCALC 100*  TRIG 129  CHOLHDL 3.2   ------------------------------------------------------------------------------------------------------------------ Recent Labs    07/21/17 2038  TSH 0.631    ------------------------------------------------------------------------------------------------------------------ No results for input(s): VITAMINB12, FOLATE, FERRITIN, TIBC, IRON, RETICCTPCT in the last 72 hours.  Coagulation profile No results for input(s): INR, PROTIME in the last 168 hours.  No results for input(s): DDIMER in the last 72 hours.  Cardiac Enzymes Recent Labs  Lab 07/21/17 2305 07/22/17 0504 07/22/17 1027  TROPONINI 0.03* 0.04* <0.03   ------------------------------------------------------------------------------------------------------------------ Invalid input(s): POCBNP   CBG: No results for input(s): GLUCAP in the last 168 hours.     Studies: Ct Abdomen Pelvis Wo Contrast  Result Date: 07/21/2017 CLINICAL DATA:  Nausea, unintentional weight loss. EXAM: CT CHEST, ABDOMEN AND PELVIS WITHOUT CONTRAST TECHNIQUE: Multidetector CT imaging of the chest, abdomen and pelvis was performed following the standard protocol without IV contrast. COMPARISON:  CT scan of November 06, 2008. FINDINGS: CT CHEST FINDINGS Cardiovascular: Atherosclerosis of thoracic aorta is noted without aneurysm formation. Coronary artery calcifications are noted. Minimal pericardial effusion is noted. Normal cardiac size. Mediastinum/Nodes: No enlarged mediastinal, hilar, or axillary lymph nodes. Thyroid gland, trachea, and esophagus demonstrate no significant findings. Lungs/Pleura: Lungs are clear. No pleural effusion or pneumothorax. Musculoskeletal: No chest wall mass or suspicious bone lesions identified. CT ABDOMEN PELVIS FINDINGS Hepatobiliary: No focal liver abnormality is seen. Status post cholecystectomy. No biliary dilatation. Pancreas: Unremarkable. No pancreatic ductal dilatation or surrounding inflammatory changes. Spleen: Normal in size without focal abnormality. Adrenals/Urinary Tract: Adrenal glands are unremarkable. Kidneys are normal, without renal calculi, focal lesion, or  hydronephrosis. Bladder is unremarkable. Stomach/Bowel: The stomach appears normal. There is no evidence of bowel obstruction or inflammation. Sigmoid diverticulosis  is noted without inflammation. The appendix is not visualized. Vascular/Lymphatic: Aortic atherosclerosis. No enlarged abdominal or pelvic lymph nodes. Reproductive: Status post hysterectomy. No adnexal masses. Other: No abdominal wall hernia or abnormality. No abdominopelvic ascites. Musculoskeletal: No acute or significant osseous findings. IMPRESSION: Coronary artery calcifications are noted suggesting coronary artery disease. Sigmoid diverticulosis without inflammation. Aortic Atherosclerosis (ICD10-I70.0). Electronically Signed   By: Marijo Conception, M.D.   On: 07/21/2017 19:30   Ct Chest Wo Contrast  Result Date: 07/21/2017 CLINICAL DATA:  Nausea, unintentional weight loss. EXAM: CT CHEST, ABDOMEN AND PELVIS WITHOUT CONTRAST TECHNIQUE: Multidetector CT imaging of the chest, abdomen and pelvis was performed following the standard protocol without IV contrast. COMPARISON:  CT scan of November 06, 2008. FINDINGS: CT CHEST FINDINGS Cardiovascular: Atherosclerosis of thoracic aorta is noted without aneurysm formation. Coronary artery calcifications are noted. Minimal pericardial effusion is noted. Normal cardiac size. Mediastinum/Nodes: No enlarged mediastinal, hilar, or axillary lymph nodes. Thyroid gland, trachea, and esophagus demonstrate no significant findings. Lungs/Pleura: Lungs are clear. No pleural effusion or pneumothorax. Musculoskeletal: No chest wall mass or suspicious bone lesions identified. CT ABDOMEN PELVIS FINDINGS Hepatobiliary: No focal liver abnormality is seen. Status post cholecystectomy. No biliary dilatation. Pancreas: Unremarkable. No pancreatic ductal dilatation or surrounding inflammatory changes. Spleen: Normal in size without focal abnormality. Adrenals/Urinary Tract: Adrenal glands are unremarkable. Kidneys are normal,  without renal calculi, focal lesion, or hydronephrosis. Bladder is unremarkable. Stomach/Bowel: The stomach appears normal. There is no evidence of bowel obstruction or inflammation. Sigmoid diverticulosis is noted without inflammation. The appendix is not visualized. Vascular/Lymphatic: Aortic atherosclerosis. No enlarged abdominal or pelvic lymph nodes. Reproductive: Status post hysterectomy. No adnexal masses. Other: No abdominal wall hernia or abnormality. No abdominopelvic ascites. Musculoskeletal: No acute or significant osseous findings. IMPRESSION: Coronary artery calcifications are noted suggesting coronary artery disease. Sigmoid diverticulosis without inflammation. Aortic Atherosclerosis (ICD10-I70.0). Electronically Signed   By: Marijo Conception, M.D.   On: 07/21/2017 19:30      Lab Results  Component Value Date   HGBA1C 5.2 07/21/2017   HGBA1C 6.3 06/02/2016   HGBA1C 6.5 09/10/2015   Lab Results  Component Value Date   MICROALBUR 10.71 (H) 12/23/2011   LDLCALC 100 (H) 07/21/2017   CREATININE 3.55 (H) 07/22/2017       Scheduled Meds: . amLODipine  10 mg Oral Daily  . aspirin EC  81 mg Oral Daily  . carvedilol  25 mg Oral BID  . cloNIDine  0.2 mg Oral BID  . heparin  5,000 Units Subcutaneous Q8H  . pantoprazole (PROTONIX) IV  40 mg Intravenous Q12H   Continuous Infusions: . 0.9 % NaCl with KCl 40 mEq / L 75 mL/hr (07/22/17 1356)  . magnesium sulfate 1 - 4 g bolus IVPB       LOS: 1 day    Time spent: >30 MINS    Reyne Dumas  [facility] Pager [phone]. If 7PM-7AM, please contact night-coverage at [email or web address], password [facility] 07/22/2017, 2:08 PM  LOS: 1 day

## 2017-07-22 NOTE — Progress Notes (Signed)
  Echocardiogram 2D Echocardiogram has been performed.  Bobbye Charleston 07/22/2017, 4:39 PM

## 2017-07-23 ENCOUNTER — Inpatient Hospital Stay (HOSPITAL_COMMUNITY): Payer: Medicare Other | Admitting: Anesthesiology

## 2017-07-23 ENCOUNTER — Encounter (HOSPITAL_COMMUNITY): Payer: Self-pay | Admitting: *Deleted

## 2017-07-23 ENCOUNTER — Encounter (HOSPITAL_COMMUNITY)
Admission: EM | Disposition: A | Discharge status: Home / Self Care | Payer: Self-pay | Source: Home / Self Care | Attending: Infectious Disease

## 2017-07-23 DIAGNOSIS — R112 Nausea with vomiting, unspecified: Secondary | ICD-10-CM

## 2017-07-23 DIAGNOSIS — K2970 Gastritis, unspecified, without bleeding: Secondary | ICD-10-CM

## 2017-07-23 DIAGNOSIS — K2990 Gastroduodenitis, unspecified, without bleeding: Secondary | ICD-10-CM

## 2017-07-23 DIAGNOSIS — E1110 Type 2 diabetes mellitus with ketoacidosis without coma: Secondary | ICD-10-CM

## 2017-07-23 DIAGNOSIS — R634 Abnormal weight loss: Secondary | ICD-10-CM

## 2017-07-23 DIAGNOSIS — E440 Moderate protein-calorie malnutrition: Secondary | ICD-10-CM

## 2017-07-23 HISTORY — PX: ESOPHAGOGASTRODUODENOSCOPY: SHX5428

## 2017-07-23 LAB — COMPREHENSIVE METABOLIC PANEL
ALBUMIN: 2.4 g/dL — AB (ref 3.5–5.0)
ALK PHOS: 162 U/L — AB (ref 38–126)
ALT: 7 U/L — ABNORMAL LOW (ref 14–54)
ANION GAP: 5 (ref 5–15)
AST: 7 U/L — ABNORMAL LOW (ref 15–41)
BUN: 27 mg/dL — ABNORMAL HIGH (ref 6–20)
CALCIUM: 8.5 mg/dL — AB (ref 8.9–10.3)
CHLORIDE: 121 mmol/L — AB (ref 101–111)
CO2: 16 mmol/L — AB (ref 22–32)
Creatinine, Ser: 3.65 mg/dL — ABNORMAL HIGH (ref 0.44–1.00)
GFR calc non Af Amer: 12 mL/min — ABNORMAL LOW (ref >60)
GFR, EST AFRICAN AMERICAN: 14 mL/min — AB (ref >60)
GLUCOSE: 114 mg/dL — AB (ref 65–99)
Potassium: 4.1 mmol/L (ref 3.5–5.1)
SODIUM: 142 mmol/L (ref 135–145)
Total Bilirubin: 0.5 mg/dL (ref 0.3–1.2)
Total Protein: 5 g/dL — ABNORMAL LOW (ref 6.5–8.1)

## 2017-07-23 LAB — IRON AND TIBC
Iron: 31 ug/dL (ref 28–170)
SATURATION RATIOS: 18 % (ref 10.4–31.8)
TIBC: 176 ug/dL — ABNORMAL LOW (ref 250–450)
UIBC: 145 ug/dL

## 2017-07-23 LAB — CBC
HEMATOCRIT: 22.6 % — AB (ref 36.0–46.0)
HEMOGLOBIN: 7.2 g/dL — AB (ref 12.0–15.0)
MCH: 28.7 pg (ref 26.0–34.0)
MCHC: 31.9 g/dL (ref 30.0–36.0)
MCV: 90 fL (ref 78.0–100.0)
Platelets: 242 10*3/uL (ref 150–400)
RBC: 2.51 MIL/uL — AB (ref 3.87–5.11)
RDW: 15.3 % (ref 11.5–15.5)
WBC: 4.3 10*3/uL (ref 4.0–10.5)

## 2017-07-23 LAB — RETICULOCYTES
RBC.: 2.49 MIL/uL — ABNORMAL LOW (ref 3.87–5.11)
RETIC CT PCT: 0.8 % (ref 0.4–3.1)
Retic Count, Absolute: 19.9 10*3/uL (ref 19.0–186.0)

## 2017-07-23 LAB — FOLATE: Folate: 5 ng/mL — ABNORMAL LOW (ref >5.9)

## 2017-07-23 LAB — VITAMIN D 25 HYDROXY (VIT D DEFICIENCY, FRACTURES): VIT D 25 HYDROXY: 5.1 ng/mL — AB (ref 30.0–100.0)

## 2017-07-23 LAB — VITAMIN B12: VITAMIN B 12: 229 pg/mL (ref 180–914)

## 2017-07-23 LAB — FERRITIN: FERRITIN: 43 ng/mL (ref 11–307)

## 2017-07-23 SURGERY — EGD (ESOPHAGOGASTRODUODENOSCOPY)
Anesthesia: Monitor Anesthesia Care

## 2017-07-23 MED ORDER — MAGNESIUM SULFATE IN D5W 1-5 GM/100ML-% IV SOLN
1.0000 g | Freq: Once | INTRAVENOUS | Status: AC
  Administered 2017-07-23: 1 g via INTRAVENOUS
  Filled 2017-07-23: qty 100

## 2017-07-23 MED ORDER — PROPOFOL 10 MG/ML IV BOLUS
INTRAVENOUS | Status: AC
  Filled 2017-07-23: qty 40

## 2017-07-23 MED ORDER — MAGNESIUM SULFATE 50 % IJ SOLN
1.0000 g | Freq: Once | INTRAMUSCULAR | Status: DC
Start: 2017-07-23 — End: 2017-07-23

## 2017-07-23 MED ORDER — SODIUM CHLORIDE 0.9 % IV SOLN
INTRAVENOUS | Status: DC | PRN
  Administered 2017-07-23: 11:00:00 via INTRAVENOUS

## 2017-07-23 MED ORDER — PROPOFOL 500 MG/50ML IV EMUL
INTRAVENOUS | Status: DC | PRN
  Administered 2017-07-23: 150 ug/kg/min via INTRAVENOUS

## 2017-07-23 MED ORDER — VITAMIN D3 25 MCG (1000 UNIT) PO TABS
1000.0000 [IU] | ORAL_TABLET | Freq: Every day | ORAL | Status: DC
  Administered 2017-07-23 – 2017-07-25 (×3): 1000 [IU] via ORAL
  Filled 2017-07-23 (×3): qty 1

## 2017-07-23 NOTE — Progress Notes (Signed)
[facility] PROGRESS NOTE  Stephanie Gross JAS:[id number] DOB: Apr 25, 1946 DOA: 07/21/2017   PCP: Velna Ochs, MD   Assessment/Plan: Active Problems:   DM (diabetes mellitus) type 2, uncontrolled, with ketoacidosis ([place])   Hyperlipidemia associated with type 2 diabetes mellitus ([place])   Uncontrolled hypertension   CKD (chronic kidney disease) stage 3, GFR 30-59 ml/min (H[facility])   Acute renal failure (ARF) ([place])   72 year old female with a history of coronary artery disease, diabetes, CK D stage III, hypertension, dyslipidemia, tobacco abuse [quit smoking several weeks ago] previously followed by Kimberlee Nearing presents today with poor appetite, weight loss, progressive dysphagia to solids.   Assessment and plan  Hypertensive urgency Suspect due to noncompliance Patient is unsure if she takes Norvasc and Coreg however these were reordered, started patient on clonidine Prn hydralazine as needed continue telemetry Troponin <0.03. 2-D echo: - Normal LV size with severe LV hypertrophy. EF 60-65%. Normal RV size and systolic function. Small circumferential pericardial effusion. 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 -She is hemodynamically stable at this time.  Acute on chronic renal failure,stage 4 now ,  baseline creatinine around 1.32from 3 years ago, anemia of chronic disease Suspect slowly progressive chronic kidney disease due to poorly managed diabetes as well as hypertension. No hydronephrosis on CT. Slight improvement with IV hydration No symptoms and signs of uremia Will need outpatient nephrology referral prior to discharge  Severe hypokalemia and hypomagnesemia Repleted  History of  Coronary artery disease, Continue aspirin and Coreg Cycle troponins are flat Patient states that she quit smoking 2-D echo result as mentioned above. Hemoglobin A1c 5.2 and lipid panel LDL 100  Intractable nausea, dysphagia, weight  loss CT chest abdomen pelvis negative for malignancy Patient declined Barium esophagogram GI consulted and she underwent EGD today. on a PPI  Diabetes mellitus type 2, not active  Hemoglobin A1c 5.2 Currently on no medications at home  Elevated alkaline phosphatase  TSH  0.77 25 hydroxy vitamin D levels low. - Started on vitamin D.  DVT prophylaxsis heparin  Code Status:  Full code   Family Communication: Discussed in detail with the patient, all imaging results, lab results explained to the patient   Disposition Plan: Anticipate discharge tomorrow if stable.   Consultants: gastroenterology   Procedures:  none  Antibiotics: Anti-infectives (From admission, onward)   None       HPI/Subjective: Refused barium esophagogram. She was started on a soft diet and she is tolerating her chicken sandwich  Objective: Vitals:   07/23/17 1043 07/23/17 1153 07/23/17 1211 07/23/17 1452  BP: (!) 159/61 (!) 113/39 (!) 131/50 (!) 137/55  Pulse: (!) 54 (!) 58 (!) 58 60  Resp: 12 14 13 14   Temp: 97.6 F (36.4 C) 97.6 F (36.4 C)  (!) 97.2 F (36.2 C)  TempSrc: Oral Oral  Oral  SpO2: 99% 100% 100% 99%  Weight: 47.6 kg (105 lb)     Height: 5\' 5"  (1.651 m)       Intake/Output Summary (Last 24 hours) at 07/23/2017 1621 Last data filed at 07/23/2017 1409 Gross per 24 hour  Intake 2407.5 ml  Output -  Net 2407.5 ml    Exam:  Examination:  General exam: Appears calm and comfortable  Respiratory system: Clear to auscultation. Respiratory effort normal. Cardiovascular system: S1 & S2 heard, RRR. No JVD, murmurs, rubs, gallops or clicks. No pedal edema. Gastrointestinal system: Abdomen is nondistended, soft and nontender. No organomegaly or masses felt. Normal  bowel sounds heard. Central nervous system: Alert and oriented. No focal neurological deficits. Extremities: Symmetric 5 x 5 power. Skin: No rashes, lesions or ulcers Psychiatry: Judgement and insight appear  normal. Mood & affect appropriate.   Data Reviewed: I have personally reviewed following labs and imaging studies  Micro Results No results found for this or any previous visit (from the past 240 hour(s)).  Radiology Reports Ct Abdomen Pelvis Wo Contrast  Result Date: 07/21/2017 CLINICAL DATA:  Nausea, unintentional weight loss. EXAM: CT CHEST, ABDOMEN AND PELVIS WITHOUT CONTRAST TECHNIQUE: Multidetector CT imaging of the chest, abdomen and pelvis was performed following the standard protocol without IV contrast. COMPARISON:  CT scan of November 06, 2008. FINDINGS: CT CHEST FINDINGS Cardiovascular: Atherosclerosis of thoracic aorta is noted without aneurysm formation. Coronary artery calcifications are noted. Minimal pericardial effusion is noted. Normal cardiac size. Mediastinum/Nodes: No enlarged mediastinal, hilar, or axillary lymph nodes. Thyroid gland, trachea, and esophagus demonstrate no significant findings. Lungs/Pleura: Lungs are clear. No pleural effusion or pneumothorax. Musculoskeletal: No chest wall mass or suspicious bone lesions identified. CT ABDOMEN PELVIS FINDINGS Hepatobiliary: No focal liver abnormality is seen. Status post cholecystectomy. No biliary dilatation. Pancreas: Unremarkable. No pancreatic ductal dilatation or surrounding inflammatory changes. Spleen: Normal in size without focal abnormality. Adrenals/Urinary Tract: Adrenal glands are unremarkable. Kidneys are normal, without renal calculi, focal lesion, or hydronephrosis. Bladder is unremarkable. Stomach/Bowel: The stomach appears normal. There is no evidence of bowel obstruction or inflammation. Sigmoid diverticulosis is noted without inflammation. The appendix is not visualized. Vascular/Lymphatic: Aortic atherosclerosis. No enlarged abdominal or pelvic lymph nodes. Reproductive: Status post hysterectomy. No adnexal masses. Other: No abdominal wall hernia or abnormality. No abdominopelvic ascites. Musculoskeletal: No acute or  significant osseous findings. IMPRESSION: Coronary artery calcifications are noted suggesting coronary artery disease. Sigmoid diverticulosis without inflammation. Aortic Atherosclerosis (ICD10-I70.0). Electronically Signed   By: Marijo Conception, M.D.   On: 07/21/2017 19:30   Ct Chest Wo Contrast  Result Date: 07/21/2017 CLINICAL DATA:  Nausea, unintentional weight loss. EXAM: CT CHEST, ABDOMEN AND PELVIS WITHOUT CONTRAST TECHNIQUE: Multidetector CT imaging of the chest, abdomen and pelvis was performed following the standard protocol without IV contrast. COMPARISON:  CT scan of November 06, 2008. FINDINGS: CT CHEST FINDINGS Cardiovascular: Atherosclerosis of thoracic aorta is noted without aneurysm formation. Coronary artery calcifications are noted. Minimal pericardial effusion is noted. Normal cardiac size. Mediastinum/Nodes: No enlarged mediastinal, hilar, or axillary lymph nodes. Thyroid gland, trachea, and esophagus demonstrate no significant findings. Lungs/Pleura: Lungs are clear. No pleural effusion or pneumothorax. Musculoskeletal: No chest wall mass or suspicious bone lesions identified. CT ABDOMEN PELVIS FINDINGS Hepatobiliary: No focal liver abnormality is seen. Status post cholecystectomy. No biliary dilatation. Pancreas: Unremarkable. No pancreatic ductal dilatation or surrounding inflammatory changes. Spleen: Normal in size without focal abnormality. Adrenals/Urinary Tract: Adrenal glands are unremarkable. Kidneys are normal, without renal calculi, focal lesion, or hydronephrosis. Bladder is unremarkable. Stomach/Bowel: The stomach appears normal. There is no evidence of bowel obstruction or inflammation. Sigmoid diverticulosis is noted without inflammation. The appendix is not visualized. Vascular/Lymphatic: Aortic atherosclerosis. No enlarged abdominal or pelvic lymph nodes. Reproductive: Status post hysterectomy. No adnexal masses. Other: No abdominal wall hernia or abnormality. No abdominopelvic  ascites. Musculoskeletal: No acute or significant osseous findings. IMPRESSION: Coronary artery calcifications are noted suggesting coronary artery disease. Sigmoid diverticulosis without inflammation. Aortic Atherosclerosis (ICD10-I70.0). Electronically Signed   By: Marijo Conception, M.D.   On: 07/21/2017 19:30     CBC Recent Labs  Lab 07/20/17  2243 07/21/17 1416 07/21/17 2305 07/22/17 0504 07/23/17 0554  WBC 5.7 5.7 5.4 4.8 4.3  HGB 8.4* 9.3* 8.0* 7.4* 7.2*  HCT 26.3* 28.9* 24.7* 22.9* 22.6*  PLT 326 333 302 279 242  MCV 88.9 88.4 87.9 89.5 90.0  MCH 28.4 28.4 28.5 28.9 28.7  MCHC 31.9 32.2 32.4 32.3 31.9  RDW 14.9 14.8 14.8 15.2 15.3  LYMPHSABS 1.9  --   --   --   --   MONOABS 0.4  --   --   --   --   EOSABS 0.2  --   --   --   --   BASOSABS 0.0  --   --   --   --     Chemistries  Recent Labs  Lab 07/20/17 2243 07/21/17 1415 07/21/17 1416 07/21/17 2305 07/22/17 0504 07/23/17 0554  NA 141  --  139  --  141 142  K 2.8*  --  3.1*  --  3.2* 4.1  CL 114*  --  112*  --  117* 121*  CO2 18*  --  18*  --  17* 16*  GLUCOSE 78  --  122*  --  83 114*  BUN 28*  --  26*  --  27* 27*  CREATININE 3.73*  --  3.68* 3.51* 3.55* 3.65*  CALCIUM 8.1*  --  8.1*  --  7.9* 8.5*  MG  --  1.1*  --   --  1.6*  --   AST 11*  --  11*  --  9* 7*  ALT 6*  --  7*  --  <5* 7*  ALKPHOS 208*  --  214*  --  170* 162*  BILITOT 0.1*  --  0.3  --  0.1* 0.5   ------------------------------------------------------------------------------------------------------------------ estimated creatinine clearance is 10.9 mL/min (A) (by C-G formula based on SCr of 3.65 mg/dL (H)). ------------------------------------------------------------------------------------------------------------------ Recent Labs    07/21/17 1416  HGBA1C 5.2   ------------------------------------------------------------------------------------------------------------------ Recent Labs    07/21/17 1415  CHOL 183  HDL 57  LDLCALC  100*  TRIG 129  CHOLHDL 3.2   ------------------------------------------------------------------------------------------------------------------ Recent Labs    07/21/17 2038  TSH 0.631   ------------------------------------------------------------------------------------------------------------------ Recent Labs    07/23/17 0554  VITAMINB12 229  FOLATE 5.0*  FERRITIN 43  TIBC 176*  IRON 31    Coagulation profile No results for input(s): INR, PROTIME in the last 168 hours.  No results for input(s): DDIMER in the last 72 hours.  Cardiac Enzymes Recent Labs  Lab 07/21/17 2305 07/22/17 0504 07/22/17 1027  TROPONINI 0.03* 0.04* <0.03   ------------------------------------------------------------------------------------------------------------------ Invalid input(s): POCBNP   CBG: No results for input(s): GLUCAP in the last 168 hours.     Studies: Ct Abdomen Pelvis Wo Contrast  Result Date: 07/21/2017 CLINICAL DATA:  Nausea, unintentional weight loss. EXAM: CT CHEST, ABDOMEN AND PELVIS WITHOUT CONTRAST TECHNIQUE: Multidetector CT imaging of the chest, abdomen and pelvis was performed following the standard protocol without IV contrast. COMPARISON:  CT scan of November 06, 2008. FINDINGS: CT CHEST FINDINGS Cardiovascular: Atherosclerosis of thoracic aorta is noted without aneurysm formation. Coronary artery calcifications are noted. Minimal pericardial effusion is noted. Normal cardiac size. Mediastinum/Nodes: No enlarged mediastinal, hilar, or axillary lymph nodes. Thyroid gland, trachea, and esophagus demonstrate no significant findings. Lungs/Pleura: Lungs are clear. No pleural effusion or pneumothorax. Musculoskeletal: No chest wall mass or suspicious bone lesions identified. CT ABDOMEN PELVIS FINDINGS Hepatobiliary: No focal liver abnormality is seen. Status post cholecystectomy. No biliary dilatation.  Pancreas: Unremarkable. No pancreatic ductal dilatation or surrounding  inflammatory changes. Spleen: Normal in size without focal abnormality. Adrenals/Urinary Tract: Adrenal glands are unremarkable. Kidneys are normal, without renal calculi, focal lesion, or hydronephrosis. Bladder is unremarkable. Stomach/Bowel: The stomach appears normal. There is no evidence of bowel obstruction or inflammation. Sigmoid diverticulosis is noted without inflammation. The appendix is not visualized. Vascular/Lymphatic: Aortic atherosclerosis. No enlarged abdominal or pelvic lymph nodes. Reproductive: Status post hysterectomy. No adnexal masses. Other: No abdominal wall hernia or abnormality. No abdominopelvic ascites. Musculoskeletal: No acute or significant osseous findings. IMPRESSION: Coronary artery calcifications are noted suggesting coronary artery disease. Sigmoid diverticulosis without inflammation. Aortic Atherosclerosis (ICD10-I70.0). Electronically Signed   By: Marijo Conception, M.D.   On: 07/21/2017 19:30   Ct Chest Wo Contrast  Result Date: 07/21/2017 CLINICAL DATA:  Nausea, unintentional weight loss. EXAM: CT CHEST, ABDOMEN AND PELVIS WITHOUT CONTRAST TECHNIQUE: Multidetector CT imaging of the chest, abdomen and pelvis was performed following the standard protocol without IV contrast. COMPARISON:  CT scan of November 06, 2008. FINDINGS: CT CHEST FINDINGS Cardiovascular: Atherosclerosis of thoracic aorta is noted without aneurysm formation. Coronary artery calcifications are noted. Minimal pericardial effusion is noted. Normal cardiac size. Mediastinum/Nodes: No enlarged mediastinal, hilar, or axillary lymph nodes. Thyroid gland, trachea, and esophagus demonstrate no significant findings. Lungs/Pleura: Lungs are clear. No pleural effusion or pneumothorax. Musculoskeletal: No chest wall mass or suspicious bone lesions identified. CT ABDOMEN PELVIS FINDINGS Hepatobiliary: No focal liver abnormality is seen. Status post cholecystectomy. No biliary dilatation. Pancreas: Unremarkable. No  pancreatic ductal dilatation or surrounding inflammatory changes. Spleen: Normal in size without focal abnormality. Adrenals/Urinary Tract: Adrenal glands are unremarkable. Kidneys are normal, without renal calculi, focal lesion, or hydronephrosis. Bladder is unremarkable. Stomach/Bowel: The stomach appears normal. There is no evidence of bowel obstruction or inflammation. Sigmoid diverticulosis is noted without inflammation. The appendix is not visualized. Vascular/Lymphatic: Aortic atherosclerosis. No enlarged abdominal or pelvic lymph nodes. Reproductive: Status post hysterectomy. No adnexal masses. Other: No abdominal wall hernia or abnormality. No abdominopelvic ascites. Musculoskeletal: No acute or significant osseous findings. IMPRESSION: Coronary artery calcifications are noted suggesting coronary artery disease. Sigmoid diverticulosis without inflammation. Aortic Atherosclerosis (ICD10-I70.0). Electronically Signed   By: Marijo Conception, M.D.   On: 07/21/2017 19:30      Lab Results  Component Value Date   HGBA1C 5.2 07/21/2017   HGBA1C 6.3 06/02/2016   HGBA1C 6.5 09/10/2015   Lab Results  Component Value Date   MICROALBUR 10.71 (H) 12/23/2011   LDLCALC 100 (H) 07/21/2017   CREATININE 3.65 (H) 07/23/2017       Scheduled Meds: . amLODipine  10 mg Oral Daily  . aspirin EC  81 mg Oral Daily  . carvedilol  25 mg Oral BID  . cloNIDine  0.2 mg Oral BID  . heparin  5,000 Units Subcutaneous Q8H  . pantoprazole  40 mg Oral Q0600   Continuous Infusions: . 0.9 % NaCl with KCl 40 mEq / L 125 mL/hr (07/23/17 0237)     LOS: 2 days    Time spent: >30 MINS    Maryiah Olvey  [facility] Pager [phone]. If 7PM-7AM, please contact night-coverage at [email or web address], password [email or web address] 07/23/2017, 4:21 PM  LOS: 2 days

## 2017-07-23 NOTE — Anesthesia Postprocedure Evaluation (Signed)
Anesthesia Post Note  Patient: Stephanie Gross  Procedure(s) Performed: ESOPHAGOGASTRODUODENOSCOPY (EGD) (N/A )     Patient location during evaluation: Endoscopy Anesthesia Type: MAC Level of consciousness: awake Pain management: pain level controlled Vital Signs Assessment: post-procedure vital signs reviewed and stable Respiratory status: spontaneous breathing Cardiovascular status: stable Postop Assessment: no apparent nausea or vomiting Anesthetic complications: no    Last Vitals:  Vitals:   07/23/17 1153 07/23/17 1211  BP: (!) 113/39 (!) 131/50  Pulse: (!) 58 (!) 58  Resp: 14 13  Temp: 36.4 C   SpO2: 100% 100%    Last Pain:  Vitals:   07/23/17 1153  TempSrc: Oral  PainSc: 0-No pain   Pain Goal:                 Romesha Scherer JR,JOHN [place]

## 2017-07-23 NOTE — Transfer of Care (Signed)
Immediate Anesthesia Transfer of Care Note  Patient: Stephanie Gross  Procedure(s) Performed: ESOPHAGOGASTRODUODENOSCOPY (EGD) (N/A )  Patient Location: PACU  Anesthesia Type:MAC  Level of Consciousness: awake, alert , oriented and patient cooperative  Airway & Oxygen Therapy: Patient Spontanous Breathing and Patient connected to nasal cannula oxygen  Post-op Assessment: Report given to RN and Post -op Vital signs reviewed and stable  Post vital signs: stable  Last Vitals:  Vitals Value Taken Time  BP    Temp    Pulse 63 07/23/2017 11:51 AM  Resp 17 07/23/2017 11:51 AM  SpO2 100 % 07/23/2017 11:51 AM  Vitals shown include unvalidated device data.  Last Pain:  Vitals:   07/23/17 1043  TempSrc: Oral  PainSc: 0-No pain         Complications: No apparent anesthesia complications

## 2017-07-23 NOTE — Op Note (Signed)
[facility] Patient Name: Stephanie Gross Procedure Date: 07/23/2017 MRN: [id number] Attending MD: Milus Banister , MD Date of Birth: Mar 21, 1946 CSN: [id number] Age: 72 Admit Type: Outpatient Procedure:                Upper GI endoscopy Indications:              Nausea with vomiting, Weight loss; EGD 2015 Dr.                            Henrene Pastor found gastritis, small gastric ulcer, +H.                            pylori (Clo test) Providers:                Milus Banister, MD, Zenon Mayo, RN, Charolette Child, Technician, Herbie Drape, CRNA Referring MD:              Medicines:                Monitored Anesthesia Care Complications:            No immediate complications. Estimated blood loss:                            None. Estimated Blood Loss:     Estimated blood loss: none. Procedure:                Pre-Anesthesia Assessment:                           - Prior to the procedure, a History and Physical                            was performed, and patient medications and                            allergies were reviewed. The patient's tolerance of                            previous anesthesia was also reviewed. The risks                            and benefits of the procedure and the sedation                            options and risks were discussed with the patient.                            All questions were answered, and informed consent                            was obtained. Prior Anticoagulants: The patient has  taken no previous anticoagulant or antiplatelet                            agents. ASA Grade Assessment: II - A patient with                            mild systemic disease. After reviewing the risks                            and benefits, the patient was deemed in                            satisfactory condition to undergo the procedure.                           After obtaining informed  consent, the endoscope was                            passed under direct vision. Throughout the                            procedure, the patient's blood pressure, pulse, and                            oxygen saturations were monitored continuously. The                            Endoscope was introduced through the mouth, and                            advanced to the second part of duodenum. The upper                            GI endoscopy was accomplished without difficulty.                            The patient tolerated the procedure well. Scope In: Scope Out: Findings:      Mild inflammation characterized by friability and granularity was found       in the entire examined stomach. Biopsies were taken with a cold forceps       for histology.      The exam was otherwise without abnormality. Impression:               - Mild gastritis. Biopsied.                           - The examination was otherwise normal. Moderate Sedation:      N/A- Per Anesthesia Care Recommendation:           - Return to hospital room.                           - Advance diet as tolerated.                           -  PPI once daily orally for now.                           - If biopsies show H. pylori, will start                            appropriate antibiotics. Procedure Code(s):        --- Professional ---                           [phone], Esophagogastroduodenoscopy, flexible,                            transoral; with biopsy, single or multiple Diagnosis Code(s):        --- Professional ---                           K29.70, Gastritis, unspecified, without bleeding                           R11.2, Nausea with vomiting, unspecified                           R63.4, Abnormal weight loss CPT copyright 2016 [employer]. All rights reserved. The codes documented in this report are preliminary and upon coder review may  be revised to meet current compliance requirements. Milus Banister,  MD 07/23/2017 11:50:37 AM This report has been signed electronically. Number of Addenda: 0

## 2017-07-23 NOTE — Interval H&P Note (Signed)
History and Physical Interval Note:  07/23/2017 10:55 AM  Stephanie Gross  has presented today for surgery, with the diagnosis of nausea, weight loss, anemia  The various methods of treatment have been discussed with the patient and family. After consideration of risks, benefits and other options for treatment, the patient has consented to  Procedure(s): ESOPHAGOGASTRODUODENOSCOPY (EGD) (N/A) as a surgical intervention .  The patient's history has been reviewed, patient examined, no change in status, stable for surgery.  I have reviewed the patient's chart and labs.  Questions were answered to the patient's satisfaction.     Milus Banister

## 2017-07-23 NOTE — Anesthesia Preprocedure Evaluation (Addendum)
Anesthesia Evaluation  Patient identified by MRN, date of birth, ID band Patient awake    Reviewed: Allergy & Precautions, NPO status , Patient's Chart, lab work & pertinent test results, reviewed documented beta blocker date and time   Airway Mallampati: II       Dental no notable dental hx.    Pulmonary Current Smoker,    Pulmonary exam normal breath sounds clear to auscultation       Cardiovascular hypertension, Pt. on medications and Pt. on home beta blockers Normal cardiovascular exam Rhythm:Regular Rate:Normal     Neuro/Psych CVA negative psych ROS   GI/Hepatic Neg liver ROS,   Endo/Other  diabetes  Renal/GU ARFRenal disease     Musculoskeletal   Abdominal Normal abdominal exam  (+)   Peds  Hematology   Anesthesia Other Findings Hendy Brindle Wright-McQueen  ECHO COMPLETE WO IMAGING ENHANCING AGENT  Order# [id number]  Reading physician: Larey Dresser, MD Ordering physician: Reyne Dumas, MD Study date: 07/22/17 Study Result   Result status: Final result                             *[facility] [employer].                        [place], [place] [address]                            [phone]  ------------------------------------------------------------------- Transthoracic Echocardiography  Patient:    Stephanie Gross, Dietzman MR #:       [id number] Study Date: 07/22/2017 Gender:     F Age:        72 Height:     165.1 cm Weight:     47.7 kg BSA:        1.47 m^2 Pt. Status: Room:       [address]    Reyne Dumas [id number]  JMEQASTM     HDQQI, WLNLGX [id number]  Blanch [employer] [id number]  [place], Mila Doce  PERFORMING   [facility], Inpatient  SONOGRAPHER  Roseanna Rainbow  cc:  ------------------------------------------------------------------- LV EF: 60% -    65%  ------------------------------------------------------------------- Indications:      Dyspnea 786.09.  ------------------------------------------------------------------- History:   PMH:   Coronary artery disease.  Stroke.  Risk factors: Current tobacco use. Hypertension. Diabetes mellitus. Dyslipidemia.   ------------------------------------------------------------------- Study Conclusions  - Left ventricle: The cavity size was normal. Wall thickness was   increased in a pattern of severe LVH. Systolic function was   normal. The estimated ejection fraction was in the range of 60%   to 65%. Wall motion was normal; there were no regional wall   motion abnormalities. Doppler parameters are consistent with   abnormal left ventricular relaxation (grade 1 diastolic   dysfunction). - Aortic valve: There was no stenosis. There was trivial   regurgitation. - Mitral valve: Mildly calcified annulus. There was no significant   regurgitation. - Right ventricle: The cavity size was normal. Systolic function   was normal. - Tricuspid valve: Peak RV-RA gradient (S): 17 mm Hg. -  Pulmonary arteries: PA peak pressure: 25 mm Hg (S). - Systemic veins: IVC measured 2.2 cm with > 50% respirophasic   variation, suggesting RA pressure 8 mmHg. - Pericardium, extracardiac: Small concentric pericardial effusion   without tamponade. .  Impressions:  - Normal LV size with severe LV hypertrophy. EF 60-65%. Normal RV   size     Reproductive/Obstetrics                            Anesthesia Physical Anesthesia Plan  ASA: III  Anesthesia Plan: MAC   Post-op Pain Management:    Induction:   PONV Risk Score and Plan: 1 and Ondansetron  Airway Management Planned: Natural Airway and Mask  Additional Equipment:   Intra-op Plan:   Post-operative Plan:   Informed Consent: I have reviewed the patients History and Physical, chart, labs and discussed the procedure  including the risks, benefits and alternatives for the proposed anesthesia with the patient or authorized representative who has indicated his/her understanding and acceptance.     Plan Discussed with: CRNA  Anesthesia Plan Comments:         Anesthesia Quick Evaluation

## 2017-07-24 ENCOUNTER — Encounter (HOSPITAL_COMMUNITY): Payer: Self-pay | Admitting: Gastroenterology

## 2017-07-24 ENCOUNTER — Telehealth: Payer: Self-pay

## 2017-07-24 DIAGNOSIS — E876 Hypokalemia: Secondary | ICD-10-CM

## 2017-07-24 DIAGNOSIS — I10 Essential (primary) hypertension: Secondary | ICD-10-CM

## 2017-07-24 LAB — CBC
HCT: 21.4 % — ABNORMAL LOW (ref 36.0–46.0)
HEMOGLOBIN: 6.7 g/dL — AB (ref 12.0–15.0)
MCH: 28.4 pg (ref 26.0–34.0)
MCHC: 31.3 g/dL (ref 30.0–36.0)
MCV: 90.7 fL (ref 78.0–100.0)
Platelets: 235 10*3/uL (ref 150–400)
RBC: 2.36 MIL/uL — AB (ref 3.87–5.11)
RDW: 15.6 % — ABNORMAL HIGH (ref 11.5–15.5)
WBC: 4.9 10*3/uL (ref 4.0–10.5)

## 2017-07-24 LAB — BASIC METABOLIC PANEL
Anion gap: 5 (ref 5–15)
BUN: 30 mg/dL — AB (ref 6–20)
CHLORIDE: 120 mmol/L — AB (ref 101–111)
CO2: 16 mmol/L — AB (ref 22–32)
CREATININE: 3.62 mg/dL — AB (ref 0.44–1.00)
Calcium: 8.1 mg/dL — ABNORMAL LOW (ref 8.9–10.3)
GFR calc Af Amer: 14 mL/min — ABNORMAL LOW (ref >60)
GFR calc non Af Amer: 12 mL/min — ABNORMAL LOW (ref >60)
GLUCOSE: 158 mg/dL — AB (ref 65–99)
POTASSIUM: 4.1 mmol/L (ref 3.5–5.1)
Sodium: 141 mmol/L (ref 135–145)

## 2017-07-24 LAB — MAGNESIUM: Magnesium: 1.8 mg/dL (ref 1.7–2.4)

## 2017-07-24 LAB — PREPARE RBC (CROSSMATCH)

## 2017-07-24 LAB — HEMOGLOBIN AND HEMATOCRIT, BLOOD
HCT: 25.2 % — ABNORMAL LOW (ref 36.0–46.0)
HEMOGLOBIN: 8.1 g/dL — AB (ref 12.0–15.0)

## 2017-07-24 LAB — ABO/RH: ABO/RH(D): O POS

## 2017-07-24 MED ORDER — MAGNESIUM SULFATE IN D5W 1-5 GM/100ML-% IV SOLN
1.0000 g | Freq: Once | INTRAVENOUS | Status: AC
  Administered 2017-07-24: 1 g via INTRAVENOUS
  Filled 2017-07-24: qty 100

## 2017-07-24 MED ORDER — SODIUM CHLORIDE 0.9 % IV SOLN: Freq: Once | INTRAVENOUS | Status: DC

## 2017-07-24 MED ORDER — MAGNESIUM SULFATE 50 % IJ SOLN: 1.0000 g | Freq: Once | INTRAMUSCULAR | Status: DC

## 2017-07-24 NOTE — Progress Notes (Signed)
Spoke with pt concerning PCP, explained that she could call [employer] and ask for a list of PCP in network or call a PCP of her choice to ask if they take her insurance. Pt understood. Repeat back.

## 2017-07-24 NOTE — Telephone Encounter (Signed)
-----   Message from Levin Erp, [place] sent at 07/24/2017  9:50 AM EDT ----- Regarding: Follow up appt Needs follow up appt for anemia, nausea and vomiting with Dr. Henrene Pastor in 1-2 mos. Thanks-JLL

## 2017-07-24 NOTE — Progress Notes (Addendum)
Progress Note   Subjective  Chief Complaint: Nausea and Vomiting, Weight Loss  Pt reports doing fine this morning other than having to eat "this nasty food".  Reports no further nausea or vomiting and no abdominal pain. She has not had a BM but tells me there is nothing in there to come out. She would like to go home, but is aware that she is going to get a blood transfusion this morning.    Objective   Vital signs in last 24 hours: Temp:  [97.2 F (36.2 C)-98.7 F (37.1 C)] 98.7 F (37.1 C) (03/29 0522) Pulse Rate:  [54-62] 60 (03/29 0522) Resp:  [12-20] 20 (03/29 0522) BP: (113-159)/(39-63) 129/60 (03/29 0522) SpO2:  [99 %-100 %] 100 % (03/29 0522) Weight:  [105 lb (47.6 kg)] 105 lb (47.6 kg) (03/28 1043) Last BM Date: 07/21/17 General:    AA female in NAD Heart:  Regular rate and rhythm; no murmurs Lungs: Respirations even and unlabored, lungs CTA bilaterally Abdomen:  Soft, nontender and nondistended. Normal bowel sounds. Extremities:  Without edema. Neurologic:  Alert and oriented,  grossly normal neurologically. Psych:  Cooperative. Normal mood and affect.  Intake/Output from previous day: 03/28 0701 - 03/29 0700 In: 3540 [P.O.:240; I.V.:3300] Out: -   Lab Results: Recent Labs    07/22/17 0504 07/23/17 0554 07/24/17 0541  WBC 4.8 4.3 4.9  HGB 7.4* 7.2* 6.7*  HCT 22.9* 22.6* 21.4*  PLT 279 242 235   BMET Recent Labs    07/22/17 0504 07/23/17 0554 07/24/17 0541  NA 141 142 141  K 3.2* 4.1 4.1  CL 117* 121* 120*  CO2 17* 16* 16*  GLUCOSE 83 114* 158*  BUN 27* 27* 30*  CREATININE 3.55* 3.65* 3.62*  CALCIUM 7.9* 8.5* 8.1*   LFT Recent Labs    07/23/17 0554  PROT 5.0*  ALBUMIN 2.4*  AST 7*  ALT 7*  ALKPHOS 162*  BILITOT 0.5   EGD 07/23/17-Dr. Ardis Hughs: Findings:      Mild inflammation characterized by friability and granularity was found       in the entire examined stomach. Biopsies were taken with a cold forceps       for histology.  The exam was otherwise without abnormality. Impression:               - Mild gastritis. Biopsied.                           - The examination was otherwise normal. Recommendation:           - Return to hospital room.                           - Advance diet as tolerated.                           - PPI once daily orally for now.                           - If biopsies show H. pylori, will start                            appropriate antibiotics.   Assessment / Plan:   Assessment: 1. Nausea and Vomiting: EGD as above, mild gastritis, pt reports  improvement today 2. Weight Loss 3. Anemia: hgb 6.7 overnight, pt awaiting blood transfusion; likely multifactorial in setting of CKD  Plan: 1. Continue PPI qd 2. Continue other supportive measures and monitoring of hgb with transfusion as needed <7 3. Would recommend patient follow up in our clinic in 1-2 mos with Dr. Henrene Pastor 4. Please await further recommendations from Dr. Ardis Hughs later today.  Thank you for your kind consultation. Would be ok with discharge after blood transfusion from GI standpoint.   LOS: 3 days   Levin Erp  07/24/2017, 9:37 AM  Pager # [phone]   ________________________________________________________________________  [name] GI MD note:  I personally examined the patient, reviewed the data and agree with the assessment and plan described above.  Improved N/V.  I will contact her with path report from EGD biopsies yesterday when they are back. She should stay on PPI once daily.  Normocytic anemia; had colonoscopy 2 1/2 years ago (Dr. Henrene Pastor), diverticulosis but no polyps. Would be unlikely that much has changed her in colon in that interval to warrant repeat examination now.  Anemia more likely related to her CRI (Cr 3s).  She's feeling better and is safe for d/c follow blood transfusion.  She should call for follow up appt with Dr. Henrene Pastor in 6-8 weeks after d/c.   Owens Loffler, MD [facility], [place]  Gastroenterology Pager [phone]

## 2017-07-24 NOTE — Progress Notes (Signed)
[facility] PROGRESS NOTE  Stephanie Gross GBT:[id number] DOB: 1945/09/13 DOA: 07/21/2017   PCP: Velna Ochs, MD   Assessment/Plan: Active Problems:   DM (diabetes mellitus) type 2, uncontrolled, with ketoacidosis ([place])   Hyperlipidemia associated with type 2 diabetes mellitus ([place])   Uncontrolled hypertension   CKD (chronic kidney disease) stage 3, GFR 30-59 ml/min ([facility])   Acute renal failure (ARF) ([place])   72 year old female with a history of coronary artery disease, diabetes, CK D stage III, hypertension, dyslipidemia, tobacco abuse [quit smoking several weeks ago] previously followed by Kimberlee Nearing presents today with poor appetite, weight loss, progressive dysphagia to solids.   Assessment and plan  Hypertensive urgency Suspect due to noncompliance Patient is unsure if she takes Norvasc and Coreg however these were reordered, started patient on clonidine Prn hydralazine as needed continue telemetry Troponin <0.03. 2-D echo: - Normal LV size with severe LV hypertrophy. EF 60-65%. Normal RV size and systolic function. Small circumferential pericardial effusion. 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 -She is hemodynamically stable at this time. 07/24/17: - She was noted to have some bradycardia. -Continue to monitor.  If the bradycardia is persistent then consider decreasing the dose of Coreg.  Acute on chronic renal failure,stage 4 now ,  baseline creatinine around 1.63from 3 years ago, anemia of chronic disease Suspect slowly progressive chronic kidney disease due to poorly managed diabetes as well as hypertension. No hydronephrosis on CT. Slight improvement with IV hydration No symptoms and signs of uremia Will need outpatient nephrology referral prior to discharge  Severe hypokalemia and hypomagnesemia - Repleted. - Check AM labs  History of Coronary artery disease, Continue aspirin and Coreg Cycle troponins are  flat Patient states that she quit smoking 2-D echo result as mentioned above. Hemoglobin A1c 5.2 and lipid panel LDL 100 07/24/17:  - She is not on any statins. - LDL 100. - Advised on diet and lifestyle modifications. -Patient does not have a primary care physician.. -Discussed with the case manager.  At this time they are unable to set up appointment for her with a primary care physician. -Per the case manager the patient herself has to check in network providers with her insurance and make appointment herself after discharge.  Intractable nausea, dysphagia, weight loss CT chest abdomen pelvis negative for malignancy Patient declined Barium esophagogram GI consulted and she underwent EGD. on a PPI 07/24/17: -Per GI continue PPI daily. -Follow-up with Dr. Henrene Pastor outpatient.  Anemia - She likely has chronic anemia due to the CKD - Hemoglobin 6.7. - 1 unit of PRBC ordered. - Post transfusion H/H ordered.  Diabetes mellitus type 2, not active  Hemoglobin A1c 5.2 Currently on no medications at home  Elevated alkaline phosphatase  TSH  0.77 25 hydroxy vitamin D levels low. - Started on vitamin D.  DVT prophylaxsis subcutaneous heparin due to risk of DVT. - Ordered stool for occult blood. If positive DC subcutaneous heparin and switch to lower extremity SCD's.  Code Status:  Full code   Family Communication: Discussed in detail with the patient, all imaging results, lab results explained to the patient   Disposition Plan: Anticipate discharge tomorrow if stable.   Consultants: gastroenterology   Procedures:  EGD  Antibiotics: Anti-infectives (From admission, onward)   None       HPI/Subjective: No major complaints. Afebrile.  Hemoglobin 6.7.  1 unit PRBC ordered.  Objective: Vitals:   07/24/17 0522 07/24/17 1050 07/24/17 1115 07/24/17 1345  BP: 129/60 127/60 (!) 144/66 137/67  Pulse: 60 63 63 (!) 57  Resp: 20 19 19 18   Temp: 98.7 F (37.1 C) 98.1 F  (36.7 C) 98.3 F (36.8 C) 98.3 F (36.8 C)  TempSrc: Oral Oral Oral Oral  SpO2: 100% 99% 100% 98%  Weight:      Height:        Intake/Output Summary (Last 24 hours) at 07/24/2017 1444 Last data filed at 07/24/2017 1344 Gross per 24 hour  Intake 3518.33 ml  Output -  Net 3518.33 ml    Exam:  Examination:  General exam: Appears calm and comfortable  Respiratory system: Clear to auscultation. Respiratory effort normal. Cardiovascular system: S1 & S2 heard, RRR. No JVD, murmurs, rubs, gallops or clicks. No pedal edema. Gastrointestinal system: Abdomen is nondistended, soft and nontender. No organomegaly or masses felt. Normal bowel sounds heard. Central nervous system: Alert and oriented. No focal neurological deficits. Extremities: Symmetric 5 x 5 power. Skin: No rashes, lesions or ulcers Psychiatry: Judgement and insight appear normal. Mood & affect appropriate.   Data Reviewed: I have personally reviewed following labs and imaging studies  Micro Results No results found for this or any previous visit (from the past 240 hour(s)).  Radiology Reports Ct Abdomen Pelvis Wo Contrast  Result Date: 07/21/2017 CLINICAL DATA:  Nausea, unintentional weight loss. EXAM: CT CHEST, ABDOMEN AND PELVIS WITHOUT CONTRAST TECHNIQUE: Multidetector CT imaging of the chest, abdomen and pelvis was performed following the standard protocol without IV contrast. COMPARISON:  CT scan of November 06, 2008. FINDINGS: CT CHEST FINDINGS Cardiovascular: Atherosclerosis of thoracic aorta is noted without aneurysm formation. Coronary artery calcifications are noted. Minimal pericardial effusion is noted. Normal cardiac size. Mediastinum/Nodes: No enlarged mediastinal, hilar, or axillary lymph nodes. Thyroid gland, trachea, and esophagus demonstrate no significant findings. Lungs/Pleura: Lungs are clear. No pleural effusion or pneumothorax. Musculoskeletal: No chest wall mass or suspicious bone lesions identified. CT  ABDOMEN PELVIS FINDINGS Hepatobiliary: No focal liver abnormality is seen. Status post cholecystectomy. No biliary dilatation. Pancreas: Unremarkable. No pancreatic ductal dilatation or surrounding inflammatory changes. Spleen: Normal in size without focal abnormality. Adrenals/Urinary Tract: Adrenal glands are unremarkable. Kidneys are normal, without renal calculi, focal lesion, or hydronephrosis. Bladder is unremarkable. Stomach/Bowel: The stomach appears normal. There is no evidence of bowel obstruction or inflammation. Sigmoid diverticulosis is noted without inflammation. The appendix is not visualized. Vascular/Lymphatic: Aortic atherosclerosis. No enlarged abdominal or pelvic lymph nodes. Reproductive: Status post hysterectomy. No adnexal masses. Other: No abdominal wall hernia or abnormality. No abdominopelvic ascites. Musculoskeletal: No acute or significant osseous findings. IMPRESSION: Coronary artery calcifications are noted suggesting coronary artery disease. Sigmoid diverticulosis without inflammation. Aortic Atherosclerosis (ICD10-I70.0). Electronically Signed   By: Marijo Conception, M.D.   On: 07/21/2017 19:30   Ct Chest Wo Contrast  Result Date: 07/21/2017 CLINICAL DATA:  Nausea, unintentional weight loss. EXAM: CT CHEST, ABDOMEN AND PELVIS WITHOUT CONTRAST TECHNIQUE: Multidetector CT imaging of the chest, abdomen and pelvis was performed following the standard protocol without IV contrast. COMPARISON:  CT scan of November 06, 2008. FINDINGS: CT CHEST FINDINGS Cardiovascular: Atherosclerosis of thoracic aorta is noted without aneurysm formation. Coronary artery calcifications are noted. Minimal pericardial effusion is noted. Normal cardiac size. Mediastinum/Nodes: No enlarged mediastinal, hilar, or axillary lymph nodes. Thyroid gland, trachea, and esophagus demonstrate no significant findings. Lungs/Pleura: Lungs are clear. No pleural effusion or pneumothorax. Musculoskeletal: No chest wall mass or  suspicious bone lesions identified. CT ABDOMEN PELVIS FINDINGS Hepatobiliary: No focal liver  abnormality is seen. Status post cholecystectomy. No biliary dilatation. Pancreas: Unremarkable. No pancreatic ductal dilatation or surrounding inflammatory changes. Spleen: Normal in size without focal abnormality. Adrenals/Urinary Tract: Adrenal glands are unremarkable. Kidneys are normal, without renal calculi, focal lesion, or hydronephrosis. Bladder is unremarkable. Stomach/Bowel: The stomach appears normal. There is no evidence of bowel obstruction or inflammation. Sigmoid diverticulosis is noted without inflammation. The appendix is not visualized. Vascular/Lymphatic: Aortic atherosclerosis. No enlarged abdominal or pelvic lymph nodes. Reproductive: Status post hysterectomy. No adnexal masses. Other: No abdominal wall hernia or abnormality. No abdominopelvic ascites. Musculoskeletal: No acute or significant osseous findings. IMPRESSION: Coronary artery calcifications are noted suggesting coronary artery disease. Sigmoid diverticulosis without inflammation. Aortic Atherosclerosis (ICD10-I70.0). Electronically Signed   By: Marijo Conception, M.D.   On: 07/21/2017 19:30     CBC Recent Labs  Lab 07/20/17 2243 07/21/17 1416 07/21/17 2305 07/22/17 0504 07/23/17 0554 07/24/17 0541  WBC 5.7 5.7 5.4 4.8 4.3 4.9  HGB 8.4* 9.3* 8.0* 7.4* 7.2* 6.7*  HCT 26.3* 28.9* 24.7* 22.9* 22.6* 21.4*  PLT 326 333 302 279 242 235  MCV 88.9 88.4 87.9 89.5 90.0 90.7  MCH 28.4 28.4 28.5 28.9 28.7 28.4  MCHC 31.9 32.2 32.4 32.3 31.9 31.3  RDW 14.9 14.8 14.8 15.2 15.3 15.6*  LYMPHSABS 1.9  --   --   --   --   --   MONOABS 0.4  --   --   --   --   --   EOSABS 0.2  --   --   --   --   --   BASOSABS 0.0  --   --   --   --   --     Chemistries  Recent Labs  Lab 07/20/17 2243 07/21/17 1415 07/21/17 1416 07/21/17 2305 07/22/17 0504 07/23/17 0554 07/24/17 0541  NA 141  --  139  --  141 142 141  K 2.8*  --  3.1*  --   3.2* 4.1 4.1  CL 114*  --  112*  --  117* 121* 120*  CO2 18*  --  18*  --  17* 16* 16*  GLUCOSE 78  --  122*  --  83 114* 158*  BUN 28*  --  26*  --  27* 27* 30*  CREATININE 3.73*  --  3.68* 3.51* 3.55* 3.65* 3.62*  CALCIUM 8.1*  --  8.1*  --  7.9* 8.5* 8.1*  MG  --  1.1*  --   --  1.6*  --  1.8  AST 11*  --  11*  --  9* 7*  --   ALT 6*  --  7*  --  <5* 7*  --   ALKPHOS 208*  --  214*  --  170* 162*  --   BILITOT 0.1*  --  0.3  --  0.1* 0.5  --    ------------------------------------------------------------------------------------------------------------------ estimated creatinine clearance is 11 mL/min (A) (by C-G formula based on SCr of 3.62 mg/dL (H)). ------------------------------------------------------------------------------------------------------------------ No results for input(s): HGBA1C in the last 72 hours. ------------------------------------------------------------------------------------------------------------------ No results for input(s): CHOL, HDL, LDLCALC, TRIG, CHOLHDL, LDLDIRECT in the last 72 hours. ------------------------------------------------------------------------------------------------------------------ Recent Labs    07/21/17 2038  TSH 0.631   ------------------------------------------------------------------------------------------------------------------ Recent Labs    07/23/17 0551 07/23/17 0554  VITAMINB12  --  229  FOLATE  --  5.0*  FERRITIN  --  43  TIBC  --  176*  IRON  --  31  RETICCTPCT 0.8  --  Coagulation profile No results for input(s): INR, PROTIME in the last 168 hours.  No results for input(s): DDIMER in the last 72 hours.  Cardiac Enzymes Recent Labs  Lab 07/21/17 2305 07/22/17 0504 07/22/17 1027  TROPONINI 0.03* 0.04* <0.03   ------------------------------------------------------------------------------------------------------------------ Invalid input(s): POCBNP   CBG: No results for input(s): GLUCAP in  the last 168 hours.  Studies: No results found.    Lab Results  Component Value Date   HGBA1C 5.2 07/21/2017   HGBA1C 6.3 06/02/2016   HGBA1C 6.5 09/10/2015   Lab Results  Component Value Date   MICROALBUR 10.71 (H) 12/23/2011   LDLCALC 100 (H) 07/21/2017   CREATININE 3.62 (H) 07/24/2017       Scheduled Meds: . amLODipine  10 mg Oral Daily  . aspirin EC  81 mg Oral Daily  . carvedilol  25 mg Oral BID  . cholecalciferol  1,000 Units Oral Daily  . cloNIDine  0.2 mg Oral BID  . heparin  5,000 Units Subcutaneous Q8H  . pantoprazole  40 mg Oral Q0600   Continuous Infusions: . sodium chloride    . 0.9 % NaCl with KCl 40 mEq / L Stopped (07/24/17 0700)     LOS: 3 days    Time spent: >30 MINS    [facility] Pager [phone]. If 7PM-7AM, please contact night-coverage at [email or web address], password [facility] 07/24/2017, 2:44 PM  LOS: 3 days

## 2017-07-24 NOTE — Progress Notes (Signed)
CRITICAL VALUE ALERT  Critical Value:  hgb 6.7  Date & Time Notied:  0630  Provider Notified: yes  Orders Received/Actions taken: pending

## 2017-07-24 NOTE — Telephone Encounter (Signed)
Catherin, can you please schedule this patient for office visit with Dr. Henrene Pastor in 1-2 months and contact her. Not sure if she is in hospital still or not. If so, it will show up on her discharge instructions. Thank you.

## 2017-07-25 DIAGNOSIS — R1310 Dysphagia, unspecified: Secondary | ICD-10-CM

## 2017-07-25 DIAGNOSIS — N179 Acute kidney failure, unspecified: Principal | ICD-10-CM

## 2017-07-25 DIAGNOSIS — N183 Chronic kidney disease, stage 3 (moderate): Secondary | ICD-10-CM

## 2017-07-25 DIAGNOSIS — I160 Hypertensive urgency: Secondary | ICD-10-CM

## 2017-07-25 LAB — BPAM RBC
Blood Product Expiration Date: 201904232359
ISSUE DATE / TIME: 201903291056
Unit Type and Rh: 5100

## 2017-07-25 LAB — TYPE AND SCREEN
ABO/RH(D): O POS
ANTIBODY SCREEN: NEGATIVE
Unit division: 0

## 2017-07-25 LAB — BASIC METABOLIC PANEL
Anion gap: 5 (ref 5–15)
BUN: 31 mg/dL — ABNORMAL HIGH (ref 6–20)
CALCIUM: 8.6 mg/dL — AB (ref 8.9–10.3)
CO2: 15 mmol/L — AB (ref 22–32)
CREATININE: 3.73 mg/dL — AB (ref 0.44–1.00)
Chloride: 120 mmol/L — ABNORMAL HIGH (ref 101–111)
GFR calc non Af Amer: 11 mL/min — ABNORMAL LOW (ref >60)
GFR, EST AFRICAN AMERICAN: 13 mL/min — AB (ref >60)
GLUCOSE: 134 mg/dL — AB (ref 65–99)
Potassium: 4.8 mmol/L (ref 3.5–5.1)
Sodium: 140 mmol/L (ref 135–145)

## 2017-07-25 LAB — CBC
HEMATOCRIT: 26.6 % — AB (ref 36.0–46.0)
Hemoglobin: 8.5 g/dL — ABNORMAL LOW (ref 12.0–15.0)
MCH: 28.7 pg (ref 26.0–34.0)
MCHC: 32 g/dL (ref 30.0–36.0)
MCV: 89.9 fL (ref 78.0–100.0)
PLATELETS: 243 10*3/uL (ref 150–400)
RBC: 2.96 MIL/uL — ABNORMAL LOW (ref 3.87–5.11)
RDW: 15.4 % (ref 11.5–15.5)
WBC: 6.3 10*3/uL (ref 4.0–10.5)

## 2017-07-25 MED ORDER — CLONIDINE HCL 0.2 MG PO TABS: 0.2000 mg | ORAL_TABLET | Freq: Two times a day (BID) | ORAL | 0 refills | Status: DC

## 2017-07-25 MED ORDER — VITAMIN D3 25 MCG (1000 UNIT) PO TABS: 1000.0000 [IU] | ORAL_TABLET | Freq: Every day | ORAL | 0 refills | Status: AC

## 2017-07-25 MED ORDER — CARVEDILOL 25 MG PO TABS: 25.0000 mg | ORAL_TABLET | Freq: Two times a day (BID) | ORAL | 0 refills | Status: DC

## 2017-07-25 MED ORDER — PANTOPRAZOLE SODIUM 40 MG PO TBEC: 40.0000 mg | DELAYED_RELEASE_TABLET | Freq: Every day | ORAL | 0 refills | Status: DC

## 2017-07-25 MED ORDER — AMLODIPINE BESYLATE 10 MG PO TABS: 10.0000 mg | ORAL_TABLET | Freq: Every day | ORAL | 0 refills | Status: DC

## 2017-07-25 NOTE — Progress Notes (Signed)
Pt leaving this afternoon with her family. Alert, oriented, and without c/o. Discharge instructions/prescriptions given/explained with pt verbalizing understanding. Pt aware of importance of followup appointment with PCP.

## 2017-07-25 NOTE — Discharge Summary (Signed)
Physician Discharge Summary  Stephanie Gross AJO:[id number] DOB: 1945/12/17 DOA: 07/21/2017  PCP: Velna Ochs, MD  Admit date: 07/21/2017 Discharge date: 07/25/2017  Admitted From: Home Disposition: Home  Discharge Condition:Stable CODE STATUS:Full Diet recommendation: Heart Healthy   Brief/Interim Summary: 72 year old female with a history of coronary artery disease, diabetes, CK D stage III, hypertension, dyslipidemia, tobacco abuse [quit smoking several weeks ago] previously followed by [facility] presents  with poor appetite, weight loss, progressive dysphagia to solids.  Patient was also found to have hypertensive urgency on presentation.  She has history of noncompliance.  Patient was not taking antihypertensive medications at home.  Patient was started on antihypertensive meds and currently her blood pressure stable.  Patient was also evaluated by GI and she underwent upper GI endoscopy.  Patient has chronic kidney disease  most likely secondary to untreated chronic hypertension.  She has been recommended to follow-up with nephrology as an outpatient. Patient is currently stable to be discharged to home.  Encouraged compliance to her medications and follow-ups.  Following problems were addressed during her hospitalization:  Hypertensive urgency Suspect due to noncompliance Patient is unsure if she takes Norvasc and Coreg however these were reordered,also started patient on clonidine. 2-D echo: - Normal LV size with severe LV hypertrophy. EF 60-65%. Normal RV size and systolic function. Small circumferential pericardial effusion. Would consider hypertensive cardiomyopathy .She is hemodynamically stable at this time.  Acute on chronic renal failure,stage 4 now ,  baseline creatinine around 1.41from 3 years ago, anemia of chronic disease Suspect slowly progressive chronic kidney disease due to poorly managed diabetes as well as hypertension. No hydronephrosis on CT. Slight  improvement with IV hydration No symptoms and signs of uremia Will need outpatient nephrology referral prior to discharge.  Severe hypokalemia and hypomagnesemia Repleted.  History of Coronary artery disease, Continue aspirin and Coreg Patient states that she quit smoking 2-D echo result as mentioned above. Hemoglobin A1c 5.2 and lipid panel LDL 100 Advised on diet and lifestyle modifications. Patient does not have a primary care physician.. Discussed with the case manager.  At this time they are unable to set up appointment for her with a primary care physician. Per the case manager the patient herself has to check in network providers with her insurance and make appointment herself after discharge.  Intractable nausea, dysphagia, weight loss CT chest abdomen pelvis negative for malignancy Patient declined Barium esophagogram GI consulted and she underwent EGD. Per GI continue PPI daily. Follow-up with Dr. Henrene Pastor as an outpatient.  Anemia She likely has chronic anemia due to the CKD Hemoglobin dropped to 6.7 and was transfused with 1 unit of PRBC. Post transfusion H/H stable.  Diabetes mellitus type 2, not active  Hemoglobin A1c 5.2 Currently on no medications at home  Elevated alkaline phosphatase  TSH  0.77  25 hydroxy vitamin D levels low.   Started on vitamin D.    Discharge Diagnoses:  Active Problems:   Hyperlipidemia associated with type 2 diabetes mellitus (HCC)   Uncontrolled hypertension   CKD (chronic kidney disease) stage 3, GFR 30-59 ml/min (HCC)   Acute renal failure (ARF) (HCC)   Malnutrition of moderate degree   Nausea and vomiting   Gastritis and gastroduodenitis    Discharge Instructions  Discharge Instructions    Diet - low sodium heart healthy   Complete by:  As directed    Discharge instructions   Complete by:  As directed    1) Please take prescribed medications as instructed.  2) Monitor your blood pressure at home. 3) Find a  PCP and follow-up as an outpatient as soon as possible. 4) Please do a CBC and BMP test in a week. 5) Follow up with nephrology as outpatient.  Name and number of the provider has been attached.   Increase activity slowly   Complete by:  As directed      Allergies as of 07/25/2017      Reactions   Benazepril-hydrochlorothiazide Other (See Comments)   UNKNOWN. Ok with lisinopril   Chantix [varenicline]    SUICIDAL IDEATION   Codeine Nausea And Vomiting   Methylprednisolone Other (See Comments)   UNKNOWN   Statins Other (See Comments)   "walking into a hole", heavy legs, feels like she is going to fall      Medication List    TAKE these medications   amLODipine 10 MG tablet Commonly known as:  NORVASC Take 1 tablet (10 mg total) by mouth daily.   aspirin EC 81 MG tablet Take 1 tablet (81 mg total) by mouth daily.   carvedilol 25 MG tablet Commonly known as:  COREG Take 1 tablet (25 mg total) by mouth 2 (two) times daily.   cloNIDine 0.2 MG tablet Commonly known as:  CATAPRES Take 1 tablet (0.2 mg total) by mouth 2 (two) times daily.   ondansetron 4 MG disintegrating tablet Commonly known as:  ZOFRAN ODT Take 1 tablet (4 mg total) by mouth every 8 (eight) hours as needed for nausea or vomiting.   pantoprazole 40 MG tablet Commonly known as:  PROTONIX Take 1 tablet (40 mg total) by mouth daily at 6 (six) AM. Start taking on:  07/26/2017      Follow-up Information    Velna Ochs, MD. Schedule an appointment as soon as possible for a visit in 1 week(s).   Specialty:  Internal Medicine Contact information: [place] [address] [phone]        Rosita Fire, MD. Schedule an appointment as soon as possible for a visit in 1 week(s).   Specialty:  Internal Medicine Contact information: [address] [place] [address] [phone]          Allergies  Allergen Reactions  . Benazepril-Hydrochlorothiazide Other (See Comments)     UNKNOWN. Ok with lisinopril  . Chantix [Varenicline]     SUICIDAL IDEATION  . Codeine Nausea And Vomiting  . Methylprednisolone Other (See Comments)    UNKNOWN  . Statins Other (See Comments)    "walking into a hole", heavy legs, feels like she is going to fall    Consultations:  Gastroenterology   Procedures/Studies: Ct Abdomen Pelvis Wo Contrast  Result Date: 07/21/2017 CLINICAL DATA:  Nausea, unintentional weight loss. EXAM: CT CHEST, ABDOMEN AND PELVIS WITHOUT CONTRAST TECHNIQUE: Multidetector CT imaging of the chest, abdomen and pelvis was performed following the standard protocol without IV contrast. COMPARISON:  CT scan of November 06, 2008. FINDINGS: CT CHEST FINDINGS Cardiovascular: Atherosclerosis of thoracic aorta is noted without aneurysm formation. Coronary artery calcifications are noted. Minimal pericardial effusion is noted. Normal cardiac size. Mediastinum/Nodes: No enlarged mediastinal, hilar, or axillary lymph nodes. Thyroid gland, trachea, and esophagus demonstrate no significant findings. Lungs/Pleura: Lungs are clear. No pleural effusion or pneumothorax. Musculoskeletal: No chest wall mass or suspicious bone lesions identified. CT ABDOMEN PELVIS FINDINGS Hepatobiliary: No focal liver abnormality is seen. Status post cholecystectomy. No biliary dilatation. Pancreas: Unremarkable. No pancreatic ductal dilatation or surrounding inflammatory changes. Spleen: Normal in size without  focal abnormality. Adrenals/Urinary Tract: Adrenal glands are unremarkable. Kidneys are normal, without renal calculi, focal lesion, or hydronephrosis. Bladder is unremarkable. Stomach/Bowel: The stomach appears normal. There is no evidence of bowel obstruction or inflammation. Sigmoid diverticulosis is noted without inflammation. The appendix is not visualized. Vascular/Lymphatic: Aortic atherosclerosis. No enlarged abdominal or pelvic lymph nodes. Reproductive: Status post hysterectomy. No adnexal masses.  Other: No abdominal wall hernia or abnormality. No abdominopelvic ascites. Musculoskeletal: No acute or significant osseous findings. IMPRESSION: Coronary artery calcifications are noted suggesting coronary artery disease. Sigmoid diverticulosis without inflammation. Aortic Atherosclerosis (ICD10-I70.0). Electronically Signed   By: Marijo Conception, M.D.   On: 07/21/2017 19:30   Ct Chest Wo Contrast  Result Date: 07/21/2017 CLINICAL DATA:  Nausea, unintentional weight loss. EXAM: CT CHEST, ABDOMEN AND PELVIS WITHOUT CONTRAST TECHNIQUE: Multidetector CT imaging of the chest, abdomen and pelvis was performed following the standard protocol without IV contrast. COMPARISON:  CT scan of November 06, 2008. FINDINGS: CT CHEST FINDINGS Cardiovascular: Atherosclerosis of thoracic aorta is noted without aneurysm formation. Coronary artery calcifications are noted. Minimal pericardial effusion is noted. Normal cardiac size. Mediastinum/Nodes: No enlarged mediastinal, hilar, or axillary lymph nodes. Thyroid gland, trachea, and esophagus demonstrate no significant findings. Lungs/Pleura: Lungs are clear. No pleural effusion or pneumothorax. Musculoskeletal: No chest wall mass or suspicious bone lesions identified. CT ABDOMEN PELVIS FINDINGS Hepatobiliary: No focal liver abnormality is seen. Status post cholecystectomy. No biliary dilatation. Pancreas: Unremarkable. No pancreatic ductal dilatation or surrounding inflammatory changes. Spleen: Normal in size without focal abnormality. Adrenals/Urinary Tract: Adrenal glands are unremarkable. Kidneys are normal, without renal calculi, focal lesion, or hydronephrosis. Bladder is unremarkable. Stomach/Bowel: The stomach appears normal. There is no evidence of bowel obstruction or inflammation. Sigmoid diverticulosis is noted without inflammation. The appendix is not visualized. Vascular/Lymphatic: Aortic atherosclerosis. No enlarged abdominal or pelvic lymph nodes. Reproductive: Status  post hysterectomy. No adnexal masses. Other: No abdominal wall hernia or abnormality. No abdominopelvic ascites. Musculoskeletal: No acute or significant osseous findings. IMPRESSION: Coronary artery calcifications are noted suggesting coronary artery disease. Sigmoid diverticulosis without inflammation. Aortic Atherosclerosis (ICD10-I70.0). Electronically Signed   By: Marijo Conception, M.D.   On: 07/21/2017 19:30    (Echo, Carotid, EGD, Colonoscopy, ERCP)    Subjective: Patient seen and examined the bedside this morning.  She is comfortable.  No new issues/events.  Blood pressure stable this morning.  Stable for discharge to home.  Discharge Exam: Vitals:   07/25/17 0748 07/25/17 0954  BP: 122/65 124/62  Pulse: 65 63  Resp: 20 18  Temp: 98.4 F (36.9 C) 98.5 F (36.9 C)  SpO2: 100% 100%   Vitals:   07/25/17 0519 07/25/17 0628 07/25/17 0748 07/25/17 0954  BP: (!) 164/63 (!) 165/72 122/65 124/62  Pulse: 61 (!) 58 65 63  Resp:   20 18  Temp: 98.5 F (36.9 C)  98.4 F (36.9 C) 98.5 F (36.9 C)  TempSrc: Oral  Oral Oral  SpO2: 97%  100% 100%  Weight:      Height:        General: Pt is alert, awake, not in acute distress Cardiovascular: RRR, S1/S2 +, no rubs, no gallops Respiratory: CTA bilaterally, no wheezing, no rhonchi Abdominal: Soft, NT, ND, bowel sounds + Extremities: no edema, no cyanosis    The results of significant diagnostics from this hospitalization (including imaging, microbiology, ancillary and laboratory) are listed below for reference.     Microbiology: No results found for this or any previous visit (from the  past 240 hour(s)).   Labs: BNP (last 3 results) No results for input(s): BNP in the last 8760 hours. Basic Metabolic Panel: Recent Labs  Lab 07/21/17 1415 07/21/17 1416 07/21/17 2305 07/22/17 0504 07/23/17 0554 07/24/17 0541 07/25/17 0549  NA  --  139  --  141 142 141 140  K  --  3.1*  --  3.2* 4.1 4.1 4.8  CL  --  112*  --  117* 121*  120* 120*  CO2  --  18*  --  17* 16* 16* 15*  GLUCOSE  --  122*  --  83 114* 158* 134*  BUN  --  26*  --  27* 27* 30* 31*  CREATININE  --  3.68* 3.51* 3.55* 3.65* 3.62* 3.73*  CALCIUM  --  8.1*  --  7.9* 8.5* 8.1* 8.6*  MG 1.1*  --   --  1.6*  --  1.8  --    Liver Function Tests: Recent Labs  Lab 07/20/17 2243 07/21/17 1416 07/22/17 0504 07/23/17 0554  AST 11* 11* 9* 7*  ALT 6* 7* <5* 7*  ALKPHOS 208* 214* 170* 162*  BILITOT 0.1* 0.3 0.1* 0.5  PROT 6.7 6.9 5.4* 5.0*  ALBUMIN 3.2* 3.3* 2.4* 2.4*   Recent Labs  Lab 07/21/17 1415  LIPASE 36   No results for input(s): AMMONIA in the last 168 hours. CBC: Recent Labs  Lab 07/20/17 2243  07/21/17 2305 07/22/17 0504 07/23/17 0554 07/24/17 0541 07/24/17 1600 07/25/17 0549  WBC 5.7   < > 5.4 4.8 4.3 4.9  --  6.3  NEUTROABS 3.2  --   --   --   --   --   --   --   HGB 8.4*   < > 8.0* 7.4* 7.2* 6.7* 8.1* 8.5*  HCT 26.3*   < > 24.7* 22.9* 22.6* 21.4* 25.2* 26.6*  MCV 88.9   < > 87.9 89.5 90.0 90.7  --  89.9  PLT 326   < > 302 279 242 235  --  243   < > = values in this interval not displayed.   Cardiac Enzymes: Recent Labs  Lab 07/21/17 1624 07/21/17 2305 07/22/17 0504 07/22/17 1027  TROPONINI 0.04* 0.03* 0.04* <0.03   BNP: Invalid input(s): POCBNP CBG: No results for input(s): GLUCAP in the last 168 hours. D-Dimer No results for input(s): DDIMER in the last 72 hours. Hgb A1c No results for input(s): HGBA1C in the last 72 hours. Lipid Profile No results for input(s): CHOL, HDL, LDLCALC, TRIG, CHOLHDL, LDLDIRECT in the last 72 hours. Thyroid function studies No results for input(s): TSH, T4TOTAL, T3FREE, THYROIDAB in the last 72 hours.  Invalid input(s): FREET3 Anemia work up Recent Labs    07/23/17 0551 07/23/17 0554  VITAMINB12  --  229  FOLATE  --  5.0*  FERRITIN  --  43  TIBC  --  176*  IRON  --  31  RETICCTPCT 0.8  --    Urinalysis    Component Value Date/Time   COLORURINE YELLOW 07/21/2017  1427   APPEARANCEUR CLEAR 07/21/2017 1427   LABSPEC 1.009 07/21/2017 1427   PHURINE 5.0 07/21/2017 1427   GLUCOSEU NEGATIVE 07/21/2017 1427   HGBUR MODERATE (A) 07/21/2017 1427   BILIRUBINUR NEGATIVE 07/21/2017 1427   KETONESUR NEGATIVE 07/21/2017 1427   PROTEINUR >=300 (A) 07/21/2017 1427   UROBILINOGEN 0.2 11/07/2013 2152   NITRITE NEGATIVE 07/21/2017 1427   LEUKOCYTESUR TRACE (A) 07/21/2017 1427   Sepsis Labs Invalid  input(s): PROCALCITONIN,  WBC,  LACTICIDVEN Microbiology No results found for this or any previous visit (from the past 240 hour(s)).   Time coordinating discharge: Over 30 minutes  SIGNED:   Shelly Coss, MD  [facility] 07/25/2017, 12:36 PM Pager [phone]  If 7PM-7AM, please contact night-coverage [email or web address] Password [email or web address]

## 2017-07-27 ENCOUNTER — Telehealth: Payer: Self-pay

## 2017-07-27 NOTE — Telephone Encounter (Signed)
-----   Message from Levin Erp, [place] sent at 07/24/2017  9:50 AM EDT ----- Regarding: Follow up appt Needs follow up appt for anemia, nausea and vomiting with Dr. Henrene Pastor in 1-2 mos. Thanks-JLL

## 2017-07-27 NOTE — Telephone Encounter (Signed)
Dr Henrene Pastor f/u 09/28/17 830 am the pt has been advised of the date and time.

## 2017-07-29 NOTE — Telephone Encounter (Signed)
Appt has already been schedule for 09/28/17 at 8:30am.

## 2017-07-30 ENCOUNTER — Other Ambulatory Visit: Payer: Self-pay

## 2017-07-30 NOTE — Patient Outreach (Signed)
[place] [facility]) Care Management  07/30/2017  Lexii Walsh Wright-McQueen 1945/12/09 [id number]   EMMI: General discharge red alert Referral date: 07/29/17 Referral reason: Knows who to call about changes in condition: NO Day # 1 Attempt #1  Telephone call to patient regarding EMMI general discharge red alert. Contact answering phone states patient is not at home. HIPAA compliant message left with call back phone number.   PLAN: RNCM will attempt 2nd telephone call within 4 business days.  RNCM will send outreach letter to attempt contact.   Quinn Plowman RN,BSN,CCM [facility] Telephonic  [phone]

## 2017-07-31 ENCOUNTER — Ambulatory Visit: Payer: Self-pay

## 2017-08-03 ENCOUNTER — Ambulatory Visit (INDEPENDENT_AMBULATORY_CARE_PROVIDER_SITE_OTHER): Payer: Medicare Other | Admitting: Internal Medicine

## 2017-08-03 ENCOUNTER — Other Ambulatory Visit: Payer: Self-pay

## 2017-08-03 VITALS — BP 182/72 | HR 72 | Temp 98.2°F | Ht 65.5 in | Wt 120.0 lb

## 2017-08-03 DIAGNOSIS — I129 Hypertensive chronic kidney disease with stage 1 through stage 4 chronic kidney disease, or unspecified chronic kidney disease: Secondary | ICD-10-CM | POA: Diagnosis not present

## 2017-08-03 DIAGNOSIS — I10 Essential (primary) hypertension: Secondary | ICD-10-CM

## 2017-08-03 DIAGNOSIS — E871 Hypo-osmolality and hyponatremia: Secondary | ICD-10-CM

## 2017-08-03 DIAGNOSIS — D631 Anemia in chronic kidney disease: Secondary | ICD-10-CM | POA: Diagnosis not present

## 2017-08-03 DIAGNOSIS — D649 Anemia, unspecified: Secondary | ICD-10-CM

## 2017-08-03 DIAGNOSIS — N189 Chronic kidney disease, unspecified: Secondary | ICD-10-CM | POA: Diagnosis not present

## 2017-08-03 DIAGNOSIS — E876 Hypokalemia: Secondary | ICD-10-CM | POA: Diagnosis not present

## 2017-08-03 DIAGNOSIS — Z79899 Other long term (current) drug therapy: Secondary | ICD-10-CM | POA: Diagnosis not present

## 2017-08-03 DIAGNOSIS — Z9889 Other specified postprocedural states: Secondary | ICD-10-CM

## 2017-08-03 DIAGNOSIS — N184 Chronic kidney disease, stage 4 (severe): Secondary | ICD-10-CM | POA: Diagnosis not present

## 2017-08-03 MED ORDER — FUROSEMIDE 20 MG PO TABS: 20.0000 mg | ORAL_TABLET | Freq: Every day | ORAL | 3 refills | Status: DC

## 2017-08-03 MED ORDER — CLONIDINE HCL 0.2 MG PO TABS: 0.2000 mg | ORAL_TABLET | Freq: Every day | ORAL | 0 refills | Status: DC

## 2017-08-03 NOTE — Assessment & Plan Note (Addendum)
Most recent creatinine at 3.73, GFR 13. Patient's CKD likely secondary to hypertensive nephropathy. She has a follow up appointment set up with [facility] at the end of this month. She also had a hemoglobin of ~6 and required transfusion of 1 unit of pRBCs during hospitalization. Likely due to anemia of chronic disease in setting of worsening renal function. Ferritin and iron levels were checked and on lower limit of normal.   Plan: -BMP today and in 1 week  -Follow up with Nephrology -will continue to monitor and manage patient's BP meds -CBC today to monitor Hgb -Consider starting iron supplementation next visit

## 2017-08-03 NOTE — Assessment & Plan Note (Signed)
Patient recently hospitalized for hypertensive urgency and started on Coreg 25 mg BID, Amlodipine 10 mg daily, and Clonidine 0.2 mg BID at discharged. Per chart review she was not taking any blood pressure medications prior to recent hospitalization. Patient is adherent with medications and denies side effects of medications. BP today elevated at 182/72. Will need better control with goal BP of <130/80 given CKD IV and severe LVH seen on echo (EF 60-65%).   Plan: -Add Lasix 20 mg daily  -checking BMP today given patient's hypokalemia during recent hospitalization -Continue Coreg 25 mg daily -Will TAPER off clonidine - currently on 0.2 mg BID, advised patient to decreased clonidine dose to 0.2 mg daily.  -She will return to clinic in 1 week duration for BP recheck and repeat BMP -At that visit please instruct the patient to decrease Clonidine 0.2 mg to every other day  -Please assess patient's response to lasix 20 and increase as necessary -Repeat BMP at next visit

## 2017-08-03 NOTE — Assessment & Plan Note (Signed)
Patient had K+ level of 2.8 upon admission on 3/25. Levels were replaced as needed during hospitalization. At discharge K+ normalized.   Plan: -repeat BMP today -Plan to start lasix 20 mg - will require repeat BMP in 1 week

## 2017-08-03 NOTE — Progress Notes (Signed)
   CC: hypertension, hospital follow up  HPI:  [name] is a 72 y.o. female with PMH as documented below presenting for hospital follow up. Patient was admitted at [facility] from 3/26-3/30 for multiple issues including uncontrolled hypertension, anemia requiring transfusion, hypokalemia and hyponatremia, dysphagia/weight loss requiring CT abdomen/pelvis and EGD. The patient states that she feels back to her normal state of health since hospitalization. She denies fatigue, dysphagia, N/V/D, fevers, chest pain, shortness of breath, or lightheadedness/dizziness.   Please see encounter based charting for a detailed description of the patient's acute and chronic medical problems.   Past Medical History:  Diagnosis Date  . Arthritis   . AVD (aortic valve disease)   . CAD (coronary artery disease)    s/p RCA stent 2007. Myoview 2010 normal  . Chronic obstructive bronchitis without exacerbation ([place])   . 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 Degeneration of meniscus of knee   . Derangement of left knee   . Diabetes mellitus without complication ([place])   . DM (diabetes mellitus) ([place])   . DM (diabetes mellitus) type 2, uncontrolled, with ketoacidosis ([place]) 03/12/1994   HIGH RISK FEET.. Please have patient take shoes and socks off every visit for visual foot inspection.  NPDR [facility] CSDME on 09/2011  Qualifier: Diagnosis of  By: Tomasa Hosteller MD, Edmon Crape.    . GERD (gastroesophageal reflux disease)   . HTN (hypertension)   . Hyperlipidemia   . Hypertension   . Obesity   . Vitamin D deficiency    Review of Systems:   Review of Systems  Constitutional: Negative for chills, fever and malaise/fatigue.  Respiratory: Negative for shortness of breath.   Cardiovascular: Positive for leg swelling. Negative for chest pain.  Gastrointestinal: Negative for abdominal pain, nausea and vomiting.  Neurological: Negative for dizziness.     Physical Exam:  Vitals:   08/03/17 1537  BP: (!) 182/72  Pulse: 72  Temp: 98.2 F (36.8 C)  TempSrc: Oral  SpO2: 100%  Weight: 120 lb (54.4 kg)  Height: 5' 5.5" (1.664 m)   Physical Exam  Constitutional: She is oriented to person, place, and time. She appears well-developed and well-nourished. No distress.  HENT:  Head: Normocephalic and atraumatic.  Eyes: Conjunctivae are normal.  Neck: Normal range of motion. No JVD present.  Cardiovascular: Normal rate, regular rhythm and normal heart sounds.  Pulmonary/Chest: Effort normal and breath sounds normal. No respiratory distress. She has no wheezes. She has no rales.  Abdominal: Soft. Bowel sounds are normal. There is no tenderness.  Musculoskeletal: She exhibits edema (+1 pitting edema of bilateral lower extremities).  Neurological: She is alert and oriented to person, place, and time. No cranial nerve deficit.  Skin: Skin is warm and dry.  Psychiatric: She has a normal mood and affect.    Assessment & Plan:   See Encounters Tab for problem based charting.  Patient discussed with Dr. Eppie Gibson

## 2017-08-03 NOTE — Patient Instructions (Signed)
Ms. Pacey,   I am happy you are feeling better after your hospitalization.   Please start take Lasix 20 mg daily.   Take Clonidine ONCE daily. STOP taking Clonidine twice daily.   Continue to take Coreg as prescribed.   Return to clinic in 1 week for routine labs and a blood pressure check.

## 2017-08-04 ENCOUNTER — Other Ambulatory Visit: Payer: Self-pay

## 2017-08-04 LAB — CBC
Hematocrit: 29 % — ABNORMAL LOW (ref 34.0–46.6)
Hemoglobin: 9.3 g/dL — ABNORMAL LOW (ref 11.1–15.9)
MCH: 28.8 pg (ref 26.6–33.0)
MCHC: 32.1 g/dL (ref 31.5–35.7)
MCV: 90 fL (ref 79–97)
Platelets: 231 10*3/uL (ref 150–379)
RBC: 3.23 x10E6/uL — AB (ref 3.77–5.28)
RDW: 15.5 % — ABNORMAL HIGH (ref 12.3–15.4)
WBC: 5.7 10*3/uL (ref 3.4–10.8)

## 2017-08-04 LAB — BMP8+ANION GAP
ANION GAP: 11 mmol/L (ref 10.0–18.0)
BUN/Creatinine Ratio: 7 — ABNORMAL LOW (ref 12–28)
BUN: 26 mg/dL (ref 8–27)
CALCIUM: 8.3 mg/dL — AB (ref 8.7–10.3)
CHLORIDE: 113 mmol/L — AB (ref 96–106)
CO2: 15 mmol/L — AB (ref 20–29)
Creatinine, Ser: 3.75 mg/dL — ABNORMAL HIGH (ref 0.57–1.00)
GFR calc non Af Amer: 12 mL/min/{1.73_m2} — ABNORMAL LOW (ref >59)
GFR, EST AFRICAN AMERICAN: 13 mL/min/{1.73_m2} — AB (ref >59)
GLUCOSE: 146 mg/dL — AB (ref 65–99)
POTASSIUM: 4.6 mmol/L (ref 3.5–5.2)
Sodium: 139 mmol/L (ref 134–144)

## 2017-08-04 NOTE — Progress Notes (Addendum)
Patient ID: Stephanie Gross, female   DOB: 1945-06-26, 72 y.o.   MRN: [id number]  Case discussed with Dr. Aggie Hacker at the time of the visit. We reviewed the resident's history and exam and pertinent patient test results. I agree with the assessment, diagnosis, and plan of care documented in the resident's note.  Review of her repeat BMP and CBC are notable for stability of her renal function, normal potassium, and a Hct of 29 (up from 26).  Clonidine may not be the best choice for her hypertension if there are concerns about consistent compliance and that is why we have begun a taper.  With her renal desiease she may have a volume componenet to her hypertension, hence the reason lasix was started.  If she remains hypertensive, which we are anticipating, he may benefit from a peripheral alpha blocker while we complete the clonidine taper and address any volume issues related to her severe chronic kidney disease.

## 2017-08-04 NOTE — Patient Outreach (Signed)
[place] [facility]) Care Management  08/04/2017  Stephanie Gross 08-19-1945 [id number]   EMMI: General discharge red alert Referral date: 07/29/17 Referral reason: Knows who to call about changes in condition: NO Day # 1 Attempt #2  Telephone call to patient regarding EMMI general discharge red alert. Unable to reach patient. HIPAA compliant voice message left with call back phone number.   PLAN: RNCM will attempt 3rd telephone call within 4 business days.    Quinn Plowman RN,BSN,CCM [facility] Telephonic  [phone]

## 2017-08-10 ENCOUNTER — Other Ambulatory Visit: Payer: Self-pay

## 2017-08-10 NOTE — Patient Outreach (Signed)
[place] [facility]) Care Management  08/10/2017  Stephanie Gross 02-Mar-1946 [id number]     EMMI:General discharge red alert Referral date:07/29/17 Referral reason:Knows who to call about changes in condition: NO Day #1 Attempt #3  Telephone call to [place] EMMI general discharge red alert. Unable to reach patient. HIPAA compliant voice message left with call back phone number.   PLAN:If no return call from patient will proceed with case closure.   Quinn Plowman RN,BSN,CCM [facility] Telephonic  [phone]

## 2017-08-11 ENCOUNTER — Ambulatory Visit: Payer: Self-pay

## 2017-08-12 ENCOUNTER — Other Ambulatory Visit: Payer: Self-pay

## 2017-08-12 NOTE — Patient Outreach (Signed)
[place] [facility]) Care Management  08/12/2017  Stephanie Gross 01/07/1946 [id number]  Case Closure No response from patient after 3 telephone calls and outreach letter attempt.  PLAN: RNCM will close patient due to being unable to reach.  RNCM will send closure notification to patients primary MD.   Quinn Plowman RN,BSN,CCM [facility] Telephonic  [phone]

## 2017-08-17 ENCOUNTER — Ambulatory Visit (INDEPENDENT_AMBULATORY_CARE_PROVIDER_SITE_OTHER): Payer: Medicare Other | Admitting: Internal Medicine

## 2017-08-17 ENCOUNTER — Encounter: Payer: Self-pay | Admitting: Internal Medicine

## 2017-08-17 VITALS — BP 201/71 | HR 68 | Temp 98.8°F | Wt 111.3 lb

## 2017-08-17 DIAGNOSIS — I10 Essential (primary) hypertension: Secondary | ICD-10-CM | POA: Diagnosis not present

## 2017-08-17 DIAGNOSIS — Z8639 Personal history of other endocrine, nutritional and metabolic disease: Secondary | ICD-10-CM

## 2017-08-17 DIAGNOSIS — Z79899 Other long term (current) drug therapy: Secondary | ICD-10-CM | POA: Diagnosis not present

## 2017-08-17 DIAGNOSIS — I129 Hypertensive chronic kidney disease with stage 1 through stage 4 chronic kidney disease, or unspecified chronic kidney disease: Secondary | ICD-10-CM | POA: Diagnosis not present

## 2017-08-17 DIAGNOSIS — Z0000 Encounter for general adult medical examination without abnormal findings: Secondary | ICD-10-CM

## 2017-08-17 DIAGNOSIS — N189 Chronic kidney disease, unspecified: Secondary | ICD-10-CM | POA: Diagnosis not present

## 2017-08-17 DIAGNOSIS — Z9114 Patient's other noncompliance with medication regimen: Secondary | ICD-10-CM

## 2017-08-17 MED ORDER — AMLODIPINE BESYLATE 10 MG PO TABS: 10.0000 mg | ORAL_TABLET | Freq: Every day | ORAL | 11 refills | Status: DC

## 2017-08-17 NOTE — Progress Notes (Signed)
   CC: HTN follow up  HPI:  [name] is a 72 y.o. female with past medical history outlined below here for HTN follow up. For the details of today's visit, please refer to the assessment and plan.  Past Medical History:  Diagnosis Date  . Arthritis   . AVD (aortic valve disease)   . CAD (coronary artery disease)    s/p RCA stent 2007. Myoview 2010 normal  . Chronic obstructive bronchitis without exacerbation ([place])   . 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 Degeneration of meniscus of knee   . Derangement of left knee   . Diabetes mellitus without complication ([place])   . DM (diabetes mellitus) ([place])   . DM (diabetes mellitus) type 2, uncontrolled, with ketoacidosis ([place]) 03/12/1994   HIGH RISK FEET.. Please have patient take shoes and socks off every visit for visual foot inspection.  NPDR OU CSDME on 09/2011  Qualifier: Diagnosis of  By: Tomasa Hosteller MD, Edmon Crape.    . GERD (gastroesophageal reflux disease)   . HTN (hypertension)   . Hyperlipidemia   . Hypertension   . Obesity   . Vitamin D deficiency     ROS  Physical Exam:  Vitals:   08/17/17 1559  BP: (!) 203/69  Pulse: 70  Temp: 98.8 F (37.1 C)  TempSrc: Oral  SpO2: 100%  Weight: 111 lb 4.8 oz (50.5 kg)    Constitutional: NAD, appears comfortable Cardiovascular: RRR, no murmurs, rubs, or gallops.  Pulmonary/Chest: CTAB, no wheezes, rales, or rhonchi. Extremities: Warm and well perfused.  No edema.  Psychiatric: Normal mood and affect  Assessment & Plan:   See Encounters Tab for problem based charting.  Patient discussed with Dr. Angelia Mould

## 2017-08-17 NOTE — Assessment & Plan Note (Addendum)
Patient is here for blood pressure follow up. She is prescribed amlodipine 10 mg daily, Coreg 25 mg BID, and clonidine which was decreased from 0.2 mg BID -> daily at her last visit and she was started on lasix 20 mg daily. BP today is uncontrolled, 203/69. I do not believe she is compliant with her medications. She is adamant that she is taking them, however on review of her medications, two of her prescription bottles were filled almost one year ago (amlodipine & carvedilol). She was recently admitted for HTN urgency in the setting of non compliance. I stressed the importance of medication adherence in the setting of her CKD and her elevated risk for stroke and heart attack. Will stop clonidine today given risk of rebound hypertension if taking inconsistently. Continue other medications for now. I am hesitant to add medications as I do not believe she is taking the ones currently prescribed. I have sent new refills of her amlodipine and carvedilol to her pharmacy. Follow up 3 months. -- Stop Clonidine  -- Continue amlodipine 10 mg daily -- Continue Carvedilol 25 mg BID -- Continue lasix 20 mg daily -- Follow up BMP -- Follow up 3 months   ADDENDUM: Stable CKD. Potassium WNL.

## 2017-08-17 NOTE — Assessment & Plan Note (Signed)
Patient refused DEXA and pneumovax. Agreeable to mammogram. Referral placed.

## 2017-08-17 NOTE — Patient Instructions (Signed)
FOLLOW-UP INSTRUCTIONS When: 3 Months For: BP follow up What to bring: Medications   Stephanie Gross,  It was a pleasure to see you today. Please stop taking clonidine. I would like for you to continue taking all of your other blood pressure medications. I will call you with the results of your blood work today. I have also placed an order for a mammogram, you will be contacted to schedule this. If you have any questions or concerns, call our clinic at [phone] or after hours call [phone] and ask for the internal medicine resident on call. Thank you!  - Dr. Philipp Ovens

## 2017-08-17 NOTE — Assessment & Plan Note (Signed)
Lantus was discontinued due to non compliance, and we have been monitoring off insulin. Last A1c was 5.2. -- Repeat 6 months

## 2017-08-18 LAB — BMP8+ANION GAP
Anion Gap: 16 mmol/L (ref 10.0–18.0)
BUN / CREAT RATIO: 8 — AB (ref 12–28)
BUN: 26 mg/dL (ref 8–27)
CHLORIDE: 108 mmol/L — AB (ref 96–106)
CO2: 17 mmol/L — AB (ref 20–29)
Calcium: 8.1 mg/dL — ABNORMAL LOW (ref 8.7–10.3)
Creatinine, Ser: 3.43 mg/dL — ABNORMAL HIGH (ref 0.57–1.00)
GFR calc non Af Amer: 13 mL/min/{1.73_m2} — ABNORMAL LOW (ref >59)
GFR, EST AFRICAN AMERICAN: 15 mL/min/{1.73_m2} — AB (ref >59)
GLUCOSE: 97 mg/dL (ref 65–99)
Potassium: 3.7 mmol/L (ref 3.5–5.2)
SODIUM: 141 mmol/L (ref 134–144)

## 2017-08-20 NOTE — Progress Notes (Signed)
Internal Medicine Clinic Attending  Case discussed with Dr. Guilloud at the time of the visit.  We reviewed the resident's history and exam and pertinent patient test results.  I agree with the assessment, diagnosis, and plan of care documented in the resident's note.  

## 2017-08-25 DIAGNOSIS — I120 Hypertensive chronic kidney disease with stage 5 chronic kidney disease or end stage renal disease: Secondary | ICD-10-CM | POA: Diagnosis not present

## 2017-08-25 DIAGNOSIS — N185 Chronic kidney disease, stage 5: Secondary | ICD-10-CM | POA: Diagnosis not present

## 2017-08-25 DIAGNOSIS — D631 Anemia in chronic kidney disease: Secondary | ICD-10-CM | POA: Diagnosis not present

## 2017-08-25 DIAGNOSIS — N2581 Secondary hyperparathyroidism of renal origin: Secondary | ICD-10-CM | POA: Diagnosis not present

## 2017-08-28 ENCOUNTER — Other Ambulatory Visit: Payer: Self-pay | Admitting: Nephrology

## 2017-08-28 DIAGNOSIS — N185 Chronic kidney disease, stage 5: Secondary | ICD-10-CM

## 2017-08-31 ENCOUNTER — Other Ambulatory Visit: Payer: Self-pay

## 2017-08-31 ENCOUNTER — Encounter (HOSPITAL_COMMUNITY): Payer: Self-pay

## 2017-08-31 ENCOUNTER — Emergency Department (HOSPITAL_COMMUNITY)
Admission: EM | Admit: 2017-08-31 | Discharge: 2017-08-31 | Disposition: A | Discharge status: Home / Self Care | Payer: Medicare Other | Attending: Emergency Medicine | Admitting: Emergency Medicine

## 2017-08-31 DIAGNOSIS — Z7982 Long term (current) use of aspirin: Secondary | ICD-10-CM | POA: Diagnosis not present

## 2017-08-31 DIAGNOSIS — E1122 Type 2 diabetes mellitus with diabetic chronic kidney disease: Secondary | ICD-10-CM | POA: Insufficient documentation

## 2017-08-31 DIAGNOSIS — N184 Chronic kidney disease, stage 4 (severe): Secondary | ICD-10-CM | POA: Insufficient documentation

## 2017-08-31 DIAGNOSIS — R55 Syncope and collapse: Secondary | ICD-10-CM | POA: Insufficient documentation

## 2017-08-31 DIAGNOSIS — Z79899 Other long term (current) drug therapy: Secondary | ICD-10-CM | POA: Insufficient documentation

## 2017-08-31 DIAGNOSIS — F17210 Nicotine dependence, cigarettes, uncomplicated: Secondary | ICD-10-CM | POA: Diagnosis not present

## 2017-08-31 DIAGNOSIS — Z955 Presence of coronary angioplasty implant and graft: Secondary | ICD-10-CM | POA: Diagnosis not present

## 2017-08-31 DIAGNOSIS — N189 Chronic kidney disease, unspecified: Secondary | ICD-10-CM | POA: Diagnosis not present

## 2017-08-31 DIAGNOSIS — I129 Hypertensive chronic kidney disease with stage 1 through stage 4 chronic kidney disease, or unspecified chronic kidney disease: Secondary | ICD-10-CM | POA: Insufficient documentation

## 2017-08-31 DIAGNOSIS — I2510 Atherosclerotic heart disease of native coronary artery without angina pectoris: Secondary | ICD-10-CM

## 2017-08-31 LAB — BASIC METABOLIC PANEL
ANION GAP: 10 (ref 5–15)
BUN: 40 mg/dL — AB (ref 6–20)
CHLORIDE: 111 mmol/L (ref 101–111)
CO2: 18 mmol/L — AB (ref 22–32)
Calcium: 8.1 mg/dL — ABNORMAL LOW (ref 8.9–10.3)
Creatinine, Ser: 4.05 mg/dL — ABNORMAL HIGH (ref 0.44–1.00)
GFR calc Af Amer: 12 mL/min — ABNORMAL LOW (ref >60)
GFR, EST NON AFRICAN AMERICAN: 10 mL/min — AB (ref >60)
GLUCOSE: 193 mg/dL — AB (ref 65–99)
Potassium: 3.5 mmol/L (ref 3.5–5.1)
Sodium: 139 mmol/L (ref 135–145)

## 2017-08-31 LAB — CBG MONITORING, ED: Glucose-Capillary: 156 mg/dL — ABNORMAL HIGH (ref 65–99)

## 2017-08-31 LAB — I-STAT TROPONIN, ED: Troponin i, poc: 0.03 ng/mL (ref 0.00–0.08)

## 2017-08-31 LAB — CBC
HEMATOCRIT: 25.9 % — AB (ref 36.0–46.0)
HEMOGLOBIN: 8.4 g/dL — AB (ref 12.0–15.0)
MCH: 28.6 pg (ref 26.0–34.0)
MCHC: 32.4 g/dL (ref 30.0–36.0)
MCV: 88.1 fL (ref 78.0–100.0)
Platelets: 300 10*3/uL (ref 150–400)
RBC: 2.94 MIL/uL — ABNORMAL LOW (ref 3.87–5.11)
RDW: 14.9 % (ref 11.5–15.5)
WBC: 5.5 10*3/uL (ref 4.0–10.5)

## 2017-08-31 NOTE — ED Triage Notes (Signed)
Patient states she was standing and had a syncopal episode at 1200 today. Patient denies hitting her head.

## 2017-08-31 NOTE — ED Notes (Signed)
Bed: WA18 Expected date:  Expected time:  Means of arrival:  Comments: Triage 1 

## 2017-08-31 NOTE — ED Provider Notes (Signed)
[facility] DEPT Provider Note   CSN: [id number] Arrival date & time: 08/31/17  1406     History   Chief Complaint Chief Complaint  Patient presents with  . Loss of Consciousness    HPI Stephanie Gross is a 72 y.o. female.  HPI Patient reports that she was at work today and had a passing out episode.  She reports she took her blood pressure medications but had not had anything to eat.  At noon, she was in a standing position and passed out fairly suddenly.  She reports she got a little bit of a sense of things closing in getting dark around her but she fell backwards and did not strike her head.  She denies any chest pain or headache preceding or after the event.  Denies any weakness numbness or tingling.  Ports she feels back to normal.  She reports the whole episode just lasted for a few seconds.  She reports she has been compliant with her medications taking amlodipine, carvedilol and Lasix.  She denies any lower extremity swelling or calf pain.  She has not had recent vomiting or diarrhea.  She reports she did have a passing out episode about 3 to 4 weeks ago.  At that time she was standing in her bathroom and it did hit fairly suddenly.  She reports she did not have any symptoms afterwards and just did not seek treatment.  She reports she has follow-up with nephrology next week.  She has known kidney disease. Past Medical History:  Diagnosis Date  . Arthritis   . AVD (aortic valve disease)   . CAD (coronary artery disease)    s/p RCA stent 2007. Myoview 2010 normal  . Chronic obstructive bronchitis without exacerbation ([place])   . 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 Degeneration of meniscus of knee   . Derangement of left knee   . Diabetes mellitus without complication ([place])   . DM (diabetes mellitus) ([place])   . DM (diabetes mellitus) type 2, uncontrolled, with ketoacidosis ([place]) 03/12/1994   HIGH RISK FEET.. Please have patient take shoes and socks off every visit for visual foot inspection.  NPDR OU CSDME on 09/2011  Qualifier: Diagnosis of  By: Tomasa Hosteller MD, Edmon Crape.    . GERD (gastroesophageal reflux disease)   . HTN (hypertension)   . Hyperlipidemia   . Hypertension   . Obesity   . Vitamin D deficiency     Patient Active Problem List   Diagnosis Date Noted  . History of diabetes mellitus 08/17/2017  . Hypokalemia 08/03/2017  . Nausea and vomiting   . Gastritis and gastroduodenitis   . Malnutrition of moderate degree 07/22/2017  . Acute renal failure (ARF) ([place]) 07/21/2017  . Noncompliance with medications 06/03/2016  . CKD stage 4 secondary to hypertension ([place]) 06/27/2015  . Tobacco abuse   . Preventative health care 03/13/2011  . Hyperlipidemia associated with type 2 diabetes mellitus ([place]) 03/12/2006  . Uncontrolled hypertension 03/12/2006  . CORONARY ARTERY DISEASE 03/12/2006    Past Surgical History:  Procedure Laterality Date  . ABDOMINAL HYSTERECTOMY    . CHOLECYSTECTOMY    . CORONARY ANGIOPLASTY WITH STENT PLACEMENT    . ESOPHAGOGASTRODUODENOSCOPY N/A 07/23/2017   Procedure: ESOPHAGOGASTRODUODENOSCOPY (EGD);  Surgeon: Milus Banister, MD;  Location: [facility];  Service: Endoscopy;  Laterality: N/A;  . KNEE SURGERY    . SHOULDER SURGERY       OB  History   None      Home Medications    Prior to Admission medications   Medication Sig Start Date End Date Taking? Authorizing Provider  amLODipine (NORVASC) 10 MG tablet Take 1 tablet (10 mg total) by mouth daily. 08/17/17 09/16/17 Yes Velna Ochs, MD  aspirin EC 81 MG tablet Take 1 tablet (81 mg total) by mouth daily. 06/02/16  Yes Velna Ochs, MD  cholecalciferol (VITAMIN D) 1000 units tablet Take 1 tablet (1,000 Units total) by mouth daily. 07/26/17  Yes Shelly Coss, MD  furosemide (LASIX) 20 MG tablet Take 1 tablet (20 mg total) by mouth daily. 08/03/17 08/03/18 Yes Lacroce, Hulen Shouts, MD  ondansetron (ZOFRAN ODT) 4 MG disintegrating tablet Take 1 tablet (4 mg total) by mouth every 8 (eight) hours as needed for nausea or vomiting. 07/21/17  Yes Recardo Evangelist, PA-C  pantoprazole (PROTONIX) 40 MG tablet Take 1 tablet (40 mg total) by mouth daily at 6 (six) AM. 07/26/17  Yes Shelly Coss, MD  carvedilol (COREG) 25 MG tablet Take 1 tablet (25 mg total) by mouth 2 (two) times daily. 07/25/17 08/24/17  Shelly Coss, MD    Family History Family History  Problem Relation Age of Onset  . Stroke Mother   . Diabetes Mother   . Hypertension Mother   . Diabetes Maternal Grandmother   . Diabetes Sister     Social History Social History   Tobacco Use  . Smoking status: Current Some Day Smoker    Packs/day: 0.50    Years: 30.00    Pack years: 15.00    Types: Cigarettes  . Smokeless tobacco: Never Used  . Tobacco comment: . Started again 4-5 cigs./day  Substance Use Topics  . Alcohol use: No    Alcohol/week: 0.0 oz  . Drug use: No     Allergies   Benazepril-hydrochlorothiazide; Chantix [varenicline]; Codeine; Methylprednisolone; and Statins   Review of Systems Review of Systems 10 Systems reviewed and are negative for acute change except as noted in the HPI.   Physical Exam Updated Vital Signs BP (!) 151/56   Pulse 66   Temp 97.9 F (36.6 C) (Oral)   Resp 15   Ht 5' 5.5" (1.664 m)   Wt 50.3 kg (111 lb)   SpO2 100%   BMI 18.19 kg/m   Physical Exam  Constitutional: She is oriented to person, place, and time. She appears well-developed and well-nourished.  HENT:  Head: Normocephalic and atraumatic.  Mouth/Throat: Oropharynx is clear and moist.  Eyes: Pupils are equal, round, and reactive to light. EOM are normal.  Neck: Neck supple.  Cardiovascular: Normal rate, regular rhythm, normal heart sounds and intact distal pulses.  Pulmonary/Chest: Effort normal and breath sounds normal.  Abdominal: Soft. Bowel sounds are normal. She exhibits no  distension. There is no tenderness. There is no guarding.  Musculoskeletal: Normal range of motion. She exhibits no edema or tenderness.  Neurological: She is alert and oriented to person, place, and time. She has normal strength. She exhibits normal muscle tone. Coordination normal. GCS eye subscore is 4. GCS verbal subscore is 5. GCS motor subscore is 6.  Normal cognitive function and speech.  All movements coordinated purposeful symmetric.  Patient is ambulatory in coordinated fashion.  Skin: Skin is warm, dry and intact.  Psychiatric: She has a normal mood and affect.     ED Treatments / Results  Labs (all labs ordered are listed, but only abnormal results are displayed) Labs Reviewed  BASIC  METABOLIC PANEL - Abnormal; Notable for the following components:      Result Value   CO2 18 (*)    Glucose, Bld 193 (*)    BUN 40 (*)    Creatinine, Ser 4.05 (*)    Calcium 8.1 (*)    GFR calc non Af Amer 10 (*)    GFR calc Af Amer 12 (*)    All other components within normal limits  CBC - Abnormal; Notable for the following components:   RBC 2.94 (*)    Hemoglobin 8.4 (*)    HCT 25.9 (*)    All other components within normal limits  CBG MONITORING, ED - Abnormal; Notable for the following components:   Glucose-Capillary 156 (*)    All other components within normal limits  URINALYSIS, ROUTINE W REFLEX MICROSCOPIC  I-STAT TROPONIN, ED    EKG EKG Interpretation  Date/Time:  Monday Aug 31 2017 14:26:34 EDT Ventricular Rate:  63 PR Interval:    QRS Duration: 86 QT Interval:  461 QTC Calculation: 472 R Axis:   74 Text Interpretation:  Sinus rhythm Baseline wander in lead(s) II lvh. no STEMI. NO st/t CHANGES COMPARED TO OLD Confirmed by Charlesetta Shanks [phone]) on 08/31/2017 5:18:03 PM   Radiology No results found.  Procedures Procedures (including critical care time)  Medications Ordered in ED Medications - No data to display   Initial Impression / Assessment and Plan / ED  Course  I have reviewed the triage vital signs and the nursing notes.  Pertinent labs & imaging results that were available during my care of the patient were reviewed by me and considered in my medical decision making (see chart for details).     Consult: Reviewed with Dr. Katharina Caper.  We reviewed patient's history of present illness and medical history.  Reviewed EKG and diagnostic results.  He advises syncope is likely orthostatic in nature.  Less likely to be acute dysrhythmia.  Suspects combination of amlodipine, Lasix and carvedilol are creating orthostasis around noontime.  Suggests amlodipine 5 mg a.m. and 5 mg in p.m.  He advises if I decide patient is stable for discharge, can follow-up in the office and discuss placement of an event monitor.  Final Clinical Impressions(s) / ED Diagnoses   Final diagnoses:  Syncope and collapse  Chronic kidney disease, unspecified CKD stage  Coronary artery disease involving native heart without angina pectoris, unspecified vessel or lesion type  Patient is well in appearance upon arrival and during her stay in the emergency department.  Rhythm has been normal sinus.  Patient has remained asymptomatic.  No chest pain or dyspnea at any time.  No lower extremity swelling or calf pain.  Doubt PE or primary pulmonary etiology.  ACS is of lower probability although patient does have significant comorbid history.  She had no chest pain or ischemic type associated symptoms.  Patient is very anxious to go home and does not wish overnight observation.  At this time with diagnostic studies stable with slight increase in patient's renal insufficiency which is chronic, plan will be for discharge with close follow-up and return precautions reviewed.  ED Discharge Orders    None       Charlesetta Shanks, MD 08/31/17 [phone]

## 2017-08-31 NOTE — Discharge Instructions (Signed)
1.  Break your amlodipine in half and take 5 mg in the morning and 5 mg in the evening.  Take your Lasix every other day for the next 4 days.  See your nephrologist this week as planned. 2.  Call your cardiologist.  You need to be seen as soon as possible and have an event monitor placed. 3.  Return to the emergency department if you develop any chest pain, feel lightheaded or have any recurrence of passing out episode.

## 2017-09-01 ENCOUNTER — Other Ambulatory Visit: Payer: Self-pay | Admitting: Internal Medicine

## 2017-09-01 ENCOUNTER — Telehealth: Payer: Self-pay | Admitting: Cardiovascular Disease

## 2017-09-01 NOTE — Telephone Encounter (Signed)
SPOKE WITH PT THAT  SHOULD ALLOW TIME FOR MEDICATION CHANGES FROM ED VISIT AND KEEP APPT WITH [facility] IN MAY PER [facility]MONS.  PT AGREED WITH RECOMMENDATIONS AND THANKED ME FOR CALL BACK.

## 2017-09-01 NOTE — Telephone Encounter (Signed)
Patient is requesting refill pantoprazole 40mg  tablet

## 2017-09-01 NOTE — Telephone Encounter (Signed)
New Message:     Pt is calling and wanting an appt due to being seen last night in the emergency room for loss of consciousness. Rosita Fire has an open appt at 1:30 today.

## 2017-09-02 ENCOUNTER — Ambulatory Visit
Admission: RE | Admit: 2017-09-02 | Discharge: 2017-09-02 | Disposition: A | Discharge status: Home / Self Care | Payer: Medicare Other | Source: Ambulatory Visit | Attending: Nephrology | Admitting: Nephrology

## 2017-09-02 ENCOUNTER — Ambulatory Visit (INDEPENDENT_AMBULATORY_CARE_PROVIDER_SITE_OTHER): Payer: Medicare Other | Admitting: Internal Medicine

## 2017-09-02 ENCOUNTER — Other Ambulatory Visit: Payer: Self-pay

## 2017-09-02 VITALS — BP 186/68 | HR 95 | Temp 97.6°F | Ht 65.5 in | Wt 107.0 lb

## 2017-09-02 DIAGNOSIS — Z9181 History of falling: Secondary | ICD-10-CM | POA: Diagnosis not present

## 2017-09-02 DIAGNOSIS — N185 Chronic kidney disease, stage 5: Secondary | ICD-10-CM | POA: Diagnosis not present

## 2017-09-02 DIAGNOSIS — I10 Essential (primary) hypertension: Secondary | ICD-10-CM

## 2017-09-02 DIAGNOSIS — N189 Chronic kidney disease, unspecified: Secondary | ICD-10-CM | POA: Diagnosis not present

## 2017-09-02 DIAGNOSIS — K2970 Gastritis, unspecified, without bleeding: Secondary | ICD-10-CM

## 2017-09-02 DIAGNOSIS — I129 Hypertensive chronic kidney disease with stage 1 through stage 4 chronic kidney disease, or unspecified chronic kidney disease: Secondary | ICD-10-CM | POA: Diagnosis not present

## 2017-09-02 DIAGNOSIS — R55 Syncope and collapse: Secondary | ICD-10-CM | POA: Diagnosis not present

## 2017-09-02 DIAGNOSIS — K2990 Gastroduodenitis, unspecified, without bleeding: Secondary | ICD-10-CM

## 2017-09-02 DIAGNOSIS — Z79899 Other long term (current) drug therapy: Secondary | ICD-10-CM

## 2017-09-02 MED ORDER — PANTOPRAZOLE SODIUM 40 MG PO TBEC: 40.0000 mg | DELAYED_RELEASE_TABLET | Freq: Every day | ORAL | 2 refills | Status: DC

## 2017-09-02 MED ORDER — CLONIDINE HCL 0.1 MG PO TABS: 0.1000 mg | ORAL_TABLET | Freq: Two times a day (BID) | ORAL | 0 refills | Status: DC

## 2017-09-02 NOTE — Patient Instructions (Signed)
I recommend you stop taking the clonidine 0.2 mg twice daily and start taking the clonidine 0.1 mg twice daily for the next 7 to 10 days.  Then you can stop this medicine completely.  I think decreasing this medicine will help with your dizziness when standing in the passing out.   We will need to see you again a few days after you finished the medicine so we can recheck your blood pressure and your symptoms.

## 2017-09-02 NOTE — [place] Notes (Signed)
   CC: Follow up for syncopal episodes, orthostatic dizziness  HPI:  [name] is a 72 y.o. female with PMHx detailed below presenting for follow up after evaluation at [facility] ED on Monday 5/6 after passing out. This was her second episode of syncope in the past 3 weeks and she is also having lightheadedness with standing during this time. She was evaluated by cardiology at that visit and not felt to be cardiogenic syncope from an occult arrhythmia. Her amlodipine was changed from 10mg  in the morning to 5mg  qAM and 5g qPM. Since that visit she continues to have episodic dizziness when standing and walking. On review of previous office visit with Dr. Philipp Ovens she was recommended to discontinue clonidine but today she is still taking this 0.2mg  BID.  See problem based assessment and plan below for additional details.  Uncontrolled hypertension She has very resistant hypertension and associated advanced chronic renal disease. She does have intermittent adherence to her regimen which may be worse due to associated symptoms at this time. I will recommend we try and discontinue the clonidine now since this is likely contributing to her orthostatic symptoms and syncope, and is high risk for harm when taking inconsistently. Plan: Continue amlodipne 5mg  BID, coreg 25mg  BID, lasix 20mg  Start clonidine 0.1mg  BID for 7-10 days then discontinue RTC in about 2 weeks for recheck of ongoing syncopal episodes   Past Medical History:  Diagnosis Date  . Arthritis   . AVD (aortic valve disease)   . CAD (coronary artery disease)    s/p RCA stent 2007. Myoview 2010 normal  . Chronic obstructive bronchitis without exacerbation ([place])   . 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 Degeneration of meniscus of knee   . Derangement of left knee   . Diabetes mellitus without complication ([place])   . DM (diabetes mellitus) ([place])   . DM (diabetes  mellitus) type 2, uncontrolled, with ketoacidosis ([place]) 03/12/1994   HIGH RISK FEET.. Please have patient take shoes and socks off every visit for visual foot inspection.  NPDR OU CSDME on 09/2011  Qualifier: Diagnosis of  By: Tomasa Hosteller MD, Edmon Crape.    . GERD (gastroesophageal reflux disease)   . HTN (hypertension)   . Hyperlipidemia   . Hypertension   . Obesity   . Vitamin D deficiency     Review of Systems: Review of Systems  HENT: Negative for tinnitus.   Eyes: Negative for blurred vision.  Cardiovascular: Negative for chest pain and leg swelling.  Musculoskeletal: Positive for falls.  Neurological: Positive for dizziness and loss of consciousness.     Physical Exam: Vitals:   09/02/17 0948  BP: (!) 186/68  Pulse: 95  Temp: 97.6 F (36.4 C)  TempSrc: Oral  SpO2: 100%  Weight: 107 lb (48.5 kg)  Height: 5' 5.5" (1.664 m)   GENERAL- Thin woman wearing multiple blankets over clothing HEENT- Atraumatic, oral mucosa appears moist CARDIAC- RRR, no murmurs, rubs or gallops. RESP- CTAB, no wheezes or crackles. EXTREMITIES- Symmetric, no pedal edema. SKIN- Warm, dry, No rash or lesion. PSYCH- Normal mood and affect, appropriate thought content and speech.   Assessment & Plan:   See encounters tab for problem based medical decision making.   Patient discussed with Dr. Angelia Mould

## 2017-09-03 NOTE — Telephone Encounter (Signed)
Rx sent in during [facility] visit on 09/02/2017.  No further action needed. Phone call complete.Despina Hidden Cassady5/9/20198:36 AM

## 2017-09-04 NOTE — Assessment & Plan Note (Signed)
She has very resistant hypertension and associated advanced chronic renal disease. She does have intermittent adherence to her regimen which may be worse due to associated symptoms at this time. I will recommend we try and discontinue the clonidine now since this is likely contributing to her orthostatic symptoms and syncope, and is high risk for harm when taking inconsistently. Plan: Continue amlodipne 5mg  BID, coreg 25mg  BID, lasix 20mg  Start clonidine 0.1mg  BID for 7-10 days then discontinue RTC in about 2 weeks for recheck of ongoing syncopal episodes

## 2017-09-07 NOTE — [place] Notes (Signed)
Internal Medicine Clinic Attending  Case discussed with Dr. Rice at the time of the visit.  We reviewed the resident's history and exam and pertinent patient test results.  I agree with the assessment, diagnosis, and plan of care documented in the resident's note.  

## 2017-09-08 ENCOUNTER — Other Ambulatory Visit (HOSPITAL_COMMUNITY): Payer: Self-pay | Admitting: *Deleted

## 2017-09-09 ENCOUNTER — Ambulatory Visit (INDEPENDENT_AMBULATORY_CARE_PROVIDER_SITE_OTHER): Payer: Medicare Other | Admitting: Internal Medicine

## 2017-09-09 ENCOUNTER — Other Ambulatory Visit: Payer: Self-pay

## 2017-09-09 ENCOUNTER — Encounter: Payer: Self-pay | Admitting: Internal Medicine

## 2017-09-09 ENCOUNTER — Ambulatory Visit (HOSPITAL_COMMUNITY)
Admission: RE | Admit: 2017-09-09 | Discharge: 2017-09-09 | Disposition: A | Discharge status: Home / Self Care | Payer: Medicare Other | Source: Ambulatory Visit | Attending: Nephrology | Admitting: Nephrology

## 2017-09-09 DIAGNOSIS — D631 Anemia in chronic kidney disease: Secondary | ICD-10-CM | POA: Diagnosis not present

## 2017-09-09 DIAGNOSIS — N185 Chronic kidney disease, stage 5: Secondary | ICD-10-CM

## 2017-09-09 DIAGNOSIS — I10 Essential (primary) hypertension: Secondary | ICD-10-CM

## 2017-09-09 DIAGNOSIS — Z79899 Other long term (current) drug therapy: Secondary | ICD-10-CM

## 2017-09-09 DIAGNOSIS — R55 Syncope and collapse: Secondary | ICD-10-CM

## 2017-09-09 DIAGNOSIS — N189 Chronic kidney disease, unspecified: Secondary | ICD-10-CM | POA: Diagnosis not present

## 2017-09-09 DIAGNOSIS — I120 Hypertensive chronic kidney disease with stage 5 chronic kidney disease or end stage renal disease: Secondary | ICD-10-CM | POA: Diagnosis not present

## 2017-09-09 MED ORDER — CLONIDINE HCL 0.1 MG PO TABS: 0.1000 mg | ORAL_TABLET | Freq: Two times a day (BID) | ORAL | 2 refills | Status: DC

## 2017-09-09 MED ORDER — SODIUM CHLORIDE 0.9 % IV SOLN
510.0000 mg | INTRAVENOUS | Status: DC
  Administered 2017-09-09: 510 mg via INTRAVENOUS
  Filled 2017-09-09: qty 17

## 2017-09-09 NOTE — Patient Instructions (Addendum)
I am glad you are feeling so much better today.  I will fill out your papers for return to work, you can pick these up at the front desk later today.  Your blood pressure is still higher than we would like but I think we should tolerate this for the time being to avoid making you feel sick or pass out.  We will continue on your current medicines and we can see you back less frequently as long as you continue feeling well.  Let us continue on your current medicines today.  It will be very important to take the twice a day medicines (carvedilol and clonidine) in the morning and evening since even a short time after missing these caused you to spend more time with high blood pressure.

## 2017-09-09 NOTE — Discharge Instructions (Signed)

## 2017-09-09 NOTE — [place] Notes (Signed)
   CC: 1 week follow up for syncope and lightheadedness  HPI:  [name] is a 72 y.o. female with PMHx detailed below presenting for follow up after titration down of her clonidine for difficult to control hypertension. She was seen in the ED on 5/6 after her second syncopal episode in 2 weeks and her amlodipine was adjusted.  She was then seen on 5/15 where her clonidine was decreased from 0.2 mg twice daily to 0.1 mg twice daily.  Since this time she is feeling much better and no longer having presyncope symptoms.  See problem based assessment and plan below for additional details.  Uncontrolled hypertension Her blood pressure is above goal today at 173/61 however orthostatic dizziness and syncopal episodes have resolved with a decrease of her clonidine.  She does also report not taking any of her medicines this morning prior to attending clinic.  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 Plan: Continue on the current dose of medicine with clonidine 0.1 twice daily Counseled her on closer medication adherence especially with twice daily pills since this has historically been a big problem Follow-up in about 8 weeks and could rechallenge blood pressure if still significantly above goal   Past Medical History:  Diagnosis Date  . Arthritis   . AVD (aortic valve disease)   . CAD (coronary artery disease)    s/p RCA stent 2007. Myoview 2010 normal  . Chronic obstructive bronchitis without exacerbation ([place])   . 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 Degeneration of meniscus of knee   . Derangement of left knee   . Diabetes mellitus without complication ([place])   . DM (diabetes mellitus) ([place])   . DM (diabetes mellitus) type 2, uncontrolled, with ketoacidosis ([place]) 03/12/1994   HIGH RISK FEET.. Please have patient take shoes and socks off every  visit for visual foot inspection.  NPDR OU CSDME on 09/2011  Qualifier: Diagnosis of  By: Tomasa Hosteller MD, Edmon Crape.    . GERD (gastroesophageal reflux disease)   . HTN (hypertension)   . Hyperlipidemia   . Hypertension   . Obesity   . Vitamin D deficiency     Review of Systems: Review of Systems  Constitutional: Negative for fever.  Respiratory: Negative for shortness of breath.   Cardiovascular: Negative for leg swelling.  Neurological: Negative for dizziness.     Physical Exam: Vitals:   09/09/17 0834  BP: (!) 173/61  Pulse: 62  Temp: 97.7 F (36.5 C)  TempSrc: Oral  SpO2: 100%  Weight: 108 lb 11.2 oz (49.3 kg)  Height: 5' 5.5" (1.664 m)   GENERAL- alert, co-operative, NAD HEENT- oral mucosa appears moist CARDIAC- RRR, no murmurs, rubs or gallops. RESP- CTAB, no wheezes or crackles. EXTREMITIES- symmetric, no pedal edema. SKIN- Warm, dry, No rash or lesion. PSYCH- Normal mood and affect, appropriate thought content and speech.   Assessment & Plan:   See encounters tab for problem based medical decision making.   Patient discussed with Dr. Angelia Mould

## 2017-09-10 ENCOUNTER — Telehealth: Payer: Self-pay | Admitting: Internal Medicine

## 2017-09-10 NOTE — Telephone Encounter (Signed)
Pt is requesting a Restriction letter to be written for her job. Pt would like for you to call her back as this letter is due by Monday 20, 2019 as she is due back at work.

## 2017-09-10 NOTE — Telephone Encounter (Signed)
I printed a letter for her. She expressed an intent to pick it up from [place] Friday.

## 2017-09-13 NOTE — Assessment & Plan Note (Signed)
Her blood pressure is above goal today at 173/61 however orthostatic dizziness and syncopal episodes have resolved with a decrease of her clonidine.  She does also report not taking any of her medicines this morning prior to attending clinic.  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 Plan: Continue on the current dose of medicine with clonidine 0.1 twice daily Counseled her on closer medication adherence especially with twice daily pills since this has historically been a big problem Follow-up in about 8 weeks and could rechallenge blood pressure if still significantly above goal

## 2017-09-14 ENCOUNTER — Encounter: Payer: Self-pay | Admitting: Internal Medicine

## 2017-09-14 NOTE — [place] Notes (Signed)
Internal Medicine Clinic Attending  Case discussed with Dr. Rice at the time of the visit.  We reviewed the resident's history and exam and pertinent patient test results.  I agree with the assessment, diagnosis, and plan of care documented in the resident's note.  

## 2017-09-14 NOTE — Telephone Encounter (Signed)
Thanks

## 2017-09-16 ENCOUNTER — Ambulatory Visit (HOSPITAL_COMMUNITY)
Admission: RE | Admit: 2017-09-16 | Discharge: 2017-09-16 | Disposition: A | Discharge status: Home / Self Care | Payer: Medicare Other | Source: Ambulatory Visit | Attending: Nephrology | Admitting: Nephrology

## 2017-09-16 DIAGNOSIS — N189 Chronic kidney disease, unspecified: Secondary | ICD-10-CM | POA: Insufficient documentation

## 2017-09-16 DIAGNOSIS — D631 Anemia in chronic kidney disease: Secondary | ICD-10-CM | POA: Diagnosis not present

## 2017-09-16 MED ORDER — SODIUM CHLORIDE 0.9 % IV SOLN
510.0000 mg | INTRAVENOUS | Status: DC
  Administered 2017-09-16: 510 mg via INTRAVENOUS
  Filled 2017-09-16: qty 17

## 2017-09-22 DIAGNOSIS — D631 Anemia in chronic kidney disease: Secondary | ICD-10-CM | POA: Diagnosis not present

## 2017-09-22 DIAGNOSIS — N2581 Secondary hyperparathyroidism of renal origin: Secondary | ICD-10-CM | POA: Diagnosis not present

## 2017-09-22 DIAGNOSIS — N185 Chronic kidney disease, stage 5: Secondary | ICD-10-CM | POA: Diagnosis not present

## 2017-09-22 DIAGNOSIS — I120 Hypertensive chronic kidney disease with stage 5 chronic kidney disease or end stage renal disease: Secondary | ICD-10-CM | POA: Diagnosis not present

## 2017-09-23 ENCOUNTER — Encounter: Payer: Self-pay | Admitting: Cardiovascular Disease

## 2017-09-23 ENCOUNTER — Ambulatory Visit: Payer: Medicare Other | Admitting: Cardiovascular Disease

## 2017-09-23 VITALS — BP 132/60 | HR 73 | Ht 65.5 in | Wt 117.8 lb

## 2017-09-23 DIAGNOSIS — Z720 Tobacco use: Secondary | ICD-10-CM

## 2017-09-23 DIAGNOSIS — I10 Essential (primary) hypertension: Secondary | ICD-10-CM

## 2017-09-23 DIAGNOSIS — I517 Cardiomegaly: Secondary | ICD-10-CM

## 2017-09-23 DIAGNOSIS — R55 Syncope and collapse: Secondary | ICD-10-CM | POA: Diagnosis not present

## 2017-09-23 DIAGNOSIS — I25118 Atherosclerotic heart disease of native coronary artery with other forms of angina pectoris: Secondary | ICD-10-CM | POA: Diagnosis not present

## 2017-09-23 DIAGNOSIS — E7800 Pure hypercholesterolemia, unspecified: Secondary | ICD-10-CM

## 2017-09-23 NOTE — Progress Notes (Signed)
Chief Complaint  Patient presents with  . Follow-up    CAD   History of Present Illness:  72 yo female with history of CAD, DM, HTN, HLD, tobacco abuse, CVA, LVH and stage 5 CKD here today for cardiac follow up. She was previously followed by Dr. [name] and then Dr. Haroldine Laws. She has a history of coronary artery disease and had a Taxus drug eluting stent placed in the RCA in January 2007. Most recent cardiac catheterization was January 2008, which showed minimal nonobstructive coronary artery disease in the major epicardial vessels with patent RCA stent and a 90% stenosis in the midsection of the small PDA. This was treated medically. She had a stress myoview in 11/10 for atypical chest pain that showed no ischemia, LVEF 70%. Normal ABI 2010. Stroke in October 2013. She was seen in 2015 and had heme positive stool and found to have a gastric ulcer and esophagitis which resolved on a PPI. Last echo August 2016 with normal LV systolic function, mild AI. She has continued to smoke. She has had trouble tolerating statins in the past but is now tolerating Lipitor. I saw her last in August 2018 and she had stopped taking her Coreg, Lisinopril and Lipitor as she thought this was causing her constipation. She was angry at [facility] for the death of her husband. She was still smoking. Echo March 2019 with severe LVH, LVEF=60-65%, trivial AI, She was seen in the ED 08/31/17 after a syncopal event, felt to be orthostatic in nature. She has stage 5 CKD and is followed in Nephrology. She tells me that they have discussed long term HD access. The patient denies any chest pain, dyspnea, palpitations. She has chronic lower extremity edema. Lasix increased in Nephrology yesterday.    Primary Care Physician: Velna Ochs, MD  Past Medical History:  Diagnosis Date  . Arthritis   . AVD (aortic valve disease)   . CAD (coronary artery disease)    s/p RCA stent 2007. Myoview 2010 normal  . Chronic obstructive  bronchitis without exacerbation ([place])   . 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 Degeneration of meniscus of knee   . Derangement of left knee   . Diabetes mellitus without complication ([place])   . DM (diabetes mellitus) ([place])   . DM (diabetes mellitus) type 2, uncontrolled, with ketoacidosis ([place]) 03/12/1994   HIGH RISK FEET.. Please have patient take shoes and socks off every visit for visual foot inspection.  NPDR [facility] CSDME on 09/2011  Qualifier: Diagnosis of  By: Tomasa Hosteller MD, Edmon Crape.    . GERD (gastroesophageal reflux disease)   . HTN (hypertension)   . Hyperlipidemia   . Hypertension   . Obesity   . Vitamin D deficiency     Past Surgical History:  Procedure Laterality Date  . ABDOMINAL HYSTERECTOMY    . CHOLECYSTECTOMY    . CORONARY ANGIOPLASTY WITH STENT PLACEMENT    . ESOPHAGOGASTRODUODENOSCOPY N/A 07/23/2017   Procedure: ESOPHAGOGASTRODUODENOSCOPY (EGD);  Surgeon: Milus Banister, MD;  Location: [facility];  Service: Endoscopy;  Laterality: N/A;  . KNEE SURGERY    . SHOULDER SURGERY      Current Outpatient Medications  Medication Sig Dispense Refill  . aspirin EC 81 MG tablet Take 1 tablet (81 mg total) by mouth daily. 90 tablet 3  . calcitRIOL (ROCALTROL) 0.25 MCG capsule Take 0.25 mcg by mouth daily.     . cholecalciferol (VITAMIN D) 1000 units  tablet Take 1 tablet (1,000 Units total) by mouth daily. 30 tablet 0  . cloNIDine (CATAPRES) 0.1 MG tablet Take 1 tablet (0.1 mg total) by mouth 2 (two) times daily. 60 tablet 2  . furosemide (LASIX) 20 MG tablet Take 1 tablet (20 mg total) by mouth daily. 30 tablet 3  . ondansetron (ZOFRAN ODT) 4 MG disintegrating tablet Take 1 tablet (4 mg total) by mouth every 8 (eight) hours as needed for nausea or vomiting. 10 tablet 0  . pantoprazole (PROTONIX) 40 MG tablet Take 1 tablet (40 mg total) by mouth daily at 6 (six) AM. 30 tablet 2  . amLODipine (NORVASC) 10 MG tablet Take 1  tablet (10 mg total) by mouth daily. 30 tablet 11  . carvedilol (COREG) 25 MG tablet Take 1 tablet (25 mg total) by mouth 2 (two) times daily. 60 tablet 0   No current facility-administered medications for this visit.     Allergies  Allergen Reactions  . Benazepril-Hydrochlorothiazide Other (See Comments)    UNKNOWN. Ok with lisinopril  . Chantix [Varenicline]     SUICIDAL IDEATION  . Codeine Nausea And Vomiting  . Methylprednisolone Other (See Comments)    UNKNOWN  . Statins Other (See Comments)    "walking into a hole", heavy legs, feels like she is going to fall    Social History   Socioeconomic History  . Marital status: Single    Spouse name: Not on file  . Number of children: Not on file  . Years of education: Not on file  . Highest education level: Not on file  Occupational History  . Occupation: unemployed  Social Needs  . Financial resource strain: Not on file  . Food insecurity:    Worry: Not on file    Inability: Not on file  . Transportation needs:    Medical: Not on file    Non-medical: Not on file  Tobacco Use  . Smoking status: Former Smoker    Packs/day: 0.50    Years: 30.00    Pack years: 15.00    Types: Cigarettes    Last attempt to quit: 08/03/2017    Years since quitting: 0.1  . Smokeless tobacco: Never Used  . Tobacco comment: STOPPED ABOUT A MONTH  AGO  Substance and Sexual Activity  . Alcohol use: No    Alcohol/week: 0.0 oz  . Drug use: No  . Sexual activity: Not on file  Lifestyle  . Physical activity:    Days per week: Not on file    Minutes per session: Not on file  . Stress: Not on file  Relationships  . Social connections:    Talks on phone: Not on file    Gets together: Not on file    Attends religious service: Not on file    Active member of club or organization: Not on file    Attends meetings of clubs or organizations: Not on file    Relationship status: Not on file  . Intimate partner violence:    Fear of current or ex  partner: Not on file    Emotionally abused: Not on file    Physically abused: Not on file    Forced sexual activity: Not on file  Other Topics Concern  . Not on file  Social History Narrative   ** Merged History Encounter **       Patient recently lost her job and is currently without insurance. - Nov 2012    Family History  Problem Relation  Age of Onset  . Stroke Mother   . Diabetes Mother   . Hypertension Mother   . Diabetes Maternal Grandmother   . Diabetes Sister     Review of Systems:  As stated in the HPI and otherwise negative.   BP 132/60   Pulse 73   Ht 5' 5.5" (1.664 m)   Wt 117 lb 12.8 oz (53.4 kg)   SpO2 98%   BMI 19.30 kg/m   Physical Examination:  General: Well developed, well nourished, NAD  HEENT: OP clear, mucus membranes moist  SKIN: warm, dry. No rashes. Neuro: No focal deficits  Musculoskeletal: Muscle strength 5/5 all ext  Psychiatric: Mood and affect normal  Neck: No JVD, no carotid bruits, no thyromegaly, no lymphadenopathy.  Lungs:Clear bilaterally, no wheezes, rhonci, crackles Cardiovascular: Regular rate and rhythm. No murmurs, gallops or rubs. Abdomen:Soft. Bowel sounds present. Non-tender.  Extremities: Trace bilateral lower extremity edema. Pulses are 2 + in the bilateral DP/PT.  Echo March 2019: - Left ventricle: The cavity size was normal. Wall thickness was   increased in a pattern of severe LVH. Systolic function was   normal. The estimated ejection fraction was in the range of 60%   to 65%. Wall motion was normal; there were no regional wall   motion abnormalities. Doppler parameters are consistent with   abnormal left ventricular relaxation (grade 1 diastolic   dysfunction). - Aortic valve: There was no stenosis. There was trivial   regurgitation. - Mitral valve: Mildly calcified annulus. There was no significant   regurgitation. - Right ventricle: The cavity size was normal. Systolic function   was normal. - Tricuspid  valve: Peak RV-RA gradient (S): 17 mm Hg. - Pulmonary arteries: PA peak pressure: 25 mm Hg (S). - Systemic veins: IVC measured 2.2 cm with > 50% respirophasic   variation, suggesting RA pressure 8 mmHg. - Pericardium, extracardiac: Small concentric pericardial effusion   without tamponade. .  Impressions:  - Normal LV size with severe LV hypertrophy. EF 60-65%. Normal RV   size and systolic function. Small circumferential pericardial   effusion. Would consider hypertensive cardiomyopathy versus   cardiac amyloidosis.  EKG:  EKG is not ordered today. The ekg ordered today demonstrates    Recent Labs: 07/21/2017: TSH 0.631 07/23/2017: ALT 7 07/24/2017: Magnesium 1.8 08/31/2017: BUN 40; Creatinine, Ser 4.05; Hemoglobin 8.4; Platelets 300; Potassium 3.5; Sodium 139   Lipid Panel    Component Value Date/Time   CHOL 183 07/21/2017 1415   CHOL 207 (H) 05/21/2015 1550   TRIG 129 07/21/2017 1415   HDL 57 07/21/2017 1415   HDL 55 05/21/2015 1550   CHOLHDL 3.2 07/21/2017 1415   VLDL 26 07/21/2017 1415   LDLCALC 100 (H) 07/21/2017 1415   LDLCALC 120 (H) 05/21/2015 1550   LDLDIRECT 111 (H) 09/15/2008 2047     Wt Readings from Last 3 Encounters:  09/23/17 117 lb 12.8 oz (53.4 kg)  09/16/17 116 lb (52.6 kg)  09/09/17 108 lb (49 kg)     Other studies Reviewed: Additional studies/ records that were reviewed today include:  Review of the above records demonstrates:    Assessment and Plan:   1. CAD with stable angina: No change in chronic stable angina. Will contiue ASA, Coreg and Norvasc.   2. Hypertension: Followed in primary care. BP controlled  3. Hyperlipidemia: She tolerates high dose statins poorly. We have discussed Praluent or Repatha injections but she has refused. She continues to refuse a statin.    4.  Tobacco Abuse:I have asked her to stop smoking  6. Prior WLT:KCXWNPIO ASA  7. Syncope: Unclear etiology. Will arrange an event monitor  8. LVH: Likely due to  her long standing HTN. She is not a good candidate for a cardiac MRI given her CKD.   Current medicines are reviewed at length with the patient today.  The patient does not have concerns regarding medicines.  The following changes have been made:  no change  Labs/ tests ordered today include:   Orders Placed This Encounter  Procedures  . CARDIAC EVENT MONITOR    Disposition:   FU with me in 6  months   Signed, Lauree Chandler, MD 09/23/2017 4:17 PM    [facility] [address], [place], [place]  [address] Phone: [phone]; Fax: [phone]

## 2017-09-23 NOTE — Patient Instructions (Signed)
Medication Instructions:  Your physician recommends that you continue on your current medications as directed. Please refer to the Current Medication list given to you today.   Labwork: none  Testing/Procedures: Your physician has recommended that you wear an event monitor. Event monitors are medical devices that record the heart's electrical activity. Doctors most often [place] these monitors to diagnose arrhythmias. Arrhythmias are problems with the speed or rhythm of the heartbeat. The monitor is a small, portable device. You can wear one while you do your normal daily activities. This is usually used to diagnose what is causing palpitations/syncope (passing out).  2 week event monitor  Follow-Up: Your physician recommends that you schedule a follow-up appointment in: 6 months. Please call our office in about 2 months to schedule this appointment    Any Other Special Instructions Will Be Listed Below (If Applicable).     If you need a refill on your cardiac medications before your next appointment, please call your pharmacy.

## 2017-09-28 ENCOUNTER — Ambulatory Visit: Payer: Self-pay | Admitting: Internal Medicine

## 2017-09-28 ENCOUNTER — Other Ambulatory Visit: Payer: Self-pay

## 2017-09-28 DIAGNOSIS — N185 Chronic kidney disease, stage 5: Secondary | ICD-10-CM

## 2017-09-28 DIAGNOSIS — Z01812 Encounter for preprocedural laboratory examination: Secondary | ICD-10-CM

## 2017-09-30 ENCOUNTER — Ambulatory Visit (INDEPENDENT_AMBULATORY_CARE_PROVIDER_SITE_OTHER): Payer: Medicare Other

## 2017-09-30 DIAGNOSIS — I25118 Atherosclerotic heart disease of native coronary artery with other forms of angina pectoris: Secondary | ICD-10-CM | POA: Diagnosis not present

## 2017-09-30 DIAGNOSIS — R55 Syncope and collapse: Secondary | ICD-10-CM

## 2017-11-03 ENCOUNTER — Ambulatory Visit: Payer: Medicare Other

## 2017-11-04 ENCOUNTER — Ambulatory Visit: Payer: Medicare Other | Admitting: Vascular Surgery

## 2017-11-04 ENCOUNTER — Ambulatory Visit (INDEPENDENT_AMBULATORY_CARE_PROVIDER_SITE_OTHER)
Admission: RE | Admit: 2017-11-04 | Discharge: 2017-11-04 | Disposition: A | Discharge status: Home / Self Care | Payer: Medicare Other | Source: Ambulatory Visit | Attending: Vascular Surgery | Admitting: Vascular Surgery

## 2017-11-04 ENCOUNTER — Ambulatory Visit (HOSPITAL_COMMUNITY)
Admission: RE | Admit: 2017-11-04 | Discharge: 2017-11-04 | Disposition: A | Discharge status: Home / Self Care | Payer: Medicare Other | Source: Ambulatory Visit | Attending: Vascular Surgery | Admitting: Vascular Surgery

## 2017-11-04 ENCOUNTER — Encounter: Payer: Self-pay | Admitting: Vascular Surgery

## 2017-11-04 ENCOUNTER — Other Ambulatory Visit: Payer: Self-pay

## 2017-11-04 VITALS — BP 236/95 | HR 72 | Temp 98.4°F | Resp 16 | Ht 65.5 in | Wt 111.0 lb

## 2017-11-04 DIAGNOSIS — Z01812 Encounter for preprocedural laboratory examination: Secondary | ICD-10-CM | POA: Insufficient documentation

## 2017-11-04 DIAGNOSIS — I742 Embolism and thrombosis of arteries of the upper extremities: Secondary | ICD-10-CM | POA: Diagnosis not present

## 2017-11-04 DIAGNOSIS — N185 Chronic kidney disease, stage 5: Secondary | ICD-10-CM | POA: Insufficient documentation

## 2017-11-04 NOTE — H&P (View-Only) (Signed)
Patient name: Stephanie Gross MRN: [id number] DOB: 1945-10-09 Sex: female   REASON FOR CONSULT:    To evaluate for hemodialysis access.  The consult is requested by Dr. Carolin Sicks.  HPI:   Stephanie Gross is a pleasant 72 y.o. female, with a history of stage V chronic kidney disease.  She is referred for evaluation for hemodialysis access.  I have reviewed the records from the referring office.  Patient is followed with chronic kidney disease which is secondary to hypertension and type 2 diabetes.  The patient has poorly controlled blood pressure with some history of noncompliance with medications.  Patient also has a history of coronary artery disease.  Her creatinine has been gradually increasing and she is referred for evaluation for hemodialysis access.  On my history, the patient has had some poor appetite.  Otherwise she denies any history of nausea, vomiting, shortness of breath, swelling, or palpitations.  She is right-handed.  She did not take her blood pressure medications this morning.  Past Medical History:  Diagnosis Date  . Arthritis   . AVD (aortic valve disease)   . CAD (coronary artery disease)    s/p RCA stent 2007. Myoview 2010 normal  . Chronic obstructive bronchitis without exacerbation ([place])   . 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 Degeneration of meniscus of knee   . Derangement of left knee   . Diabetes mellitus without complication ([place])   . DM (diabetes mellitus) ([place])   . DM (diabetes mellitus) type 2, uncontrolled, with ketoacidosis ([place]) 03/12/1994   HIGH RISK FEET.. Please have patient take shoes and socks off every visit for visual foot inspection.  NPDR [facility] CSDME on 09/2011  Qualifier: Diagnosis of  By: Tomasa Hosteller MD, Edmon Crape.    . GERD (gastroesophageal reflux disease)   . HTN (hypertension)   . Hyperlipidemia   . Hypertension   . Obesity   . Vitamin D deficiency     Family History   Problem Relation Age of Onset  . Stroke Mother   . Diabetes Mother   . Hypertension Mother   . Diabetes Maternal Grandmother   . Diabetes Sister     SOCIAL HISTORY: Social History   Socioeconomic History  . Marital status: Single    Spouse name: Not on file  . Number of children: Not on file  . Years of education: Not on file  . Highest education level: Not on file  Occupational History  . Occupation: unemployed  Social Needs  . Financial resource strain: Not on file  . Food insecurity:    Worry: Not on file    Inability: Not on file  . Transportation needs:    Medical: Not on file    Non-medical: Not on file  Tobacco Use  . Smoking status: Current Every Day Smoker    Packs/day: 0.50    Years: 30.00    Pack years: 15.00    Types: Cigarettes  . Smokeless tobacco: Never Used  . Tobacco comment: Doesn't smoke all of it. "Lights up and throws it away"  Substance and Sexual Activity  . Alcohol use: No    Alcohol/week: 0.0 oz  . Drug use: No  . Sexual activity: Not on file  Lifestyle  . Physical activity:    Days per week: Not on file    Minutes per session: Not on file  . Stress: Not on file  Relationships  . Social connections:  Talks on phone: Not on file    Gets together: Not on file    Attends religious service: Not on file    Active member of club or organization: Not on file    Attends meetings of clubs or organizations: Not on file    Relationship status: Not on file  . Intimate partner violence:    Fear of current or ex partner: Not on file    Emotionally abused: Not on file    Physically abused: Not on file    Forced sexual activity: Not on file  Other Topics Concern  . Not on file  Social History Narrative   ** Merged History Encounter **       Patient recently lost her job and is currently without insurance. - Nov 2012    Allergies  Allergen Reactions  . Benazepril-Hydrochlorothiazide Other (See Comments)    UNKNOWN. Ok with lisinopril    . Chantix [Varenicline]     SUICIDAL IDEATION  . Codeine Nausea And Vomiting  . Methylprednisolone Other (See Comments)    UNKNOWN  . Statins Other (See Comments)    "walking into a hole", heavy legs, feels like she is going to fall    Current Outpatient Medications  Medication Sig Dispense Refill  . aspirin EC 81 MG tablet Take 1 tablet (81 mg total) by mouth daily. 90 tablet 3  . calcitRIOL (ROCALTROL) 0.25 MCG capsule Take 0.25 mcg by mouth daily.     . cholecalciferol (VITAMIN D) 1000 units tablet Take 1 tablet (1,000 Units total) by mouth daily. 30 tablet 0  . cloNIDine (CATAPRES) 0.1 MG tablet Take 1 tablet (0.1 mg total) by mouth 2 (two) times daily. 60 tablet 2  . furosemide (LASIX) 20 MG tablet Take 1 tablet (20 mg total) by mouth daily. 30 tablet 3  . ondansetron (ZOFRAN ODT) 4 MG disintegrating tablet Take 1 tablet (4 mg total) by mouth every 8 (eight) hours as needed for nausea or vomiting. 10 tablet 0  . pantoprazole (PROTONIX) 40 MG tablet Take 1 tablet (40 mg total) by mouth daily at 6 (six) AM. 30 tablet 2  . amLODipine (NORVASC) 10 MG tablet Take 1 tablet (10 mg total) by mouth daily. 30 tablet 11  . carvedilol (COREG) 25 MG tablet Take 1 tablet (25 mg total) by mouth 2 (two) times daily. 60 tablet 0   No current facility-administered medications for this visit.     REVIEW OF SYSTEMS:  [X]  denotes positive finding, [ ]  denotes negative finding Cardiac  Comments:  Chest pain or chest pressure:    Shortness of breath upon exertion:    Short of breath when lying flat:    Irregular heart rhythm:        Vascular    Pain in calf, thigh, or hip brought on by ambulation:    Pain in feet at night that wakes you up from your sleep:     Blood clot in your veins:    Leg swelling:         Pulmonary    Oxygen at home:    Productive cough:     Wheezing:         Neurologic    Sudden weakness in arms or legs:     Sudden numbness in arms or legs:     Sudden onset of  difficulty speaking or slurred speech:    Temporary loss of vision in one eye:     Problems with dizziness:  Gastrointestinal    Blood in stool:     Vomited blood:         Genitourinary    Burning when urinating:     Blood in urine:        Psychiatric    Major depression:         Hematologic    Bleeding problems:    Problems with blood clotting too easily:        Skin    Rashes or ulcers:        Constitutional    Fever or chills:     PHYSICAL EXAM:   Vitals:   11/04/17 1116 11/04/17 1119  BP: (!) 231/94 (!) 236/95  Pulse: 72   Resp: 16   Temp: 98.4 F (36.9 C)   TempSrc: Oral   SpO2: 100%   Weight: 111 lb (50.3 kg)   Height: 5' 5.5" (1.664 m)     GENERAL: The patient is a well-nourished female, in no acute distress. The vital signs are documented above. CARDIAC: There is a regular rate and rhythm.  VASCULAR: I do not detect carotid bruits. She has palpable brachial and radial pulses bilaterally. PULMONARY: There is good air exchange bilaterally without wheezing or rales. ABDOMEN: Soft and non-tender with normal pitched bowel sounds.  MUSCULOSKELETAL: There are no major deformities or cyanosis. NEUROLOGIC: No focal weakness or paresthesias are detected. SKIN: There are no ulcers or rashes noted. PSYCHIATRIC: The patient has a normal affect.  DATA:    ARTERIAL DUPLEX: I have independently interpreted her upper extremity arterial duplex.  On the right side there is a triphasic radial waveform.  The ulnar waveform cannot be identified.  The brachial artery measures 0.42 cm in diameter.  On the left side there is a triphasic radial and ulnar signal.  The brachial artery measures 0.38 cm in diameter.  UPPER EXTREMITY VEIN MAP: I have independently interpreted her upper extremity vein map.  On the right side the forearm and upper arm cephalic vein looks small and is likely not adequate.  The basilic vein is marginal in size also.  On the left side the  forearm cephalic vein looks small.  The upper arm cephalic vein also looks small.  The basilic vein is marginal also.  LABS: Her GFR on 08/31/2017 was 12.  Patient did have a carotid duplex scan done on 12/09/2014 which showed no evidence of carotid disease bilaterally.  MEDICAL ISSUES:   STAGE V CHRONIC KIDNEY DISEASE: This patient is right-handed and therefore I would recommend starting with access in the left arm.  It looks like her veins are marginal in size.  She might potentially be a candidate for a left brachiocephalic fistula.  There is a small chance that that were not possible she could have a basilic vein transposition.  I explained that if she does have a basilic vein transposition we would likely do this in 2 stages.  If she does not have an adequate vein for a fistula then we would place a graft.  Her GFR is 12.  I have discussed the indications for the procedure and the potential complications and all of her questions were answered.  The patient would like to think about this before scheduling her surgery.  Hopefully she will call in the near future to schedule placement of access in the left arm.  HYPERTENSION: The patient's initial blood pressure today was elevated. We repeated this and this was still elevated. We have encouraged the patient to follow  up with their primary care physician for management of their blood pressure.  Deitra Mayo [facility] [phone]

## 2017-11-04 NOTE — Progress Notes (Signed)
Patient name: Stephanie Gross MRN: [id number] DOB: [birth year] Sex: female   REASON FOR CONSULT:    To evaluate for hemodialysis access.  The consult is requested by Dr. Carolin Sicks.  HPI:   Stephanie Gross is a pleasant 72 y.o. female, with a history of stage V chronic kidney disease.  She is referred for evaluation for hemodialysis access.  I have reviewed the records from the referring office.  Patient is followed with chronic kidney disease which is secondary to hypertension and type 2 diabetes.  The patient has poorly controlled blood pressure with some history of noncompliance with medications.  Patient also has a history of coronary artery disease.  Her creatinine has been gradually increasing and she is referred for evaluation for hemodialysis access.  On my history, the patient has had some poor appetite.  Otherwise she denies any history of nausea, vomiting, shortness of breath, swelling, or palpitations.  She is right-handed.  She did not take her blood pressure medications this morning.  Past Medical History:  Diagnosis Date  . Arthritis   . AVD (aortic valve disease)   . CAD (coronary artery disease)    s/p RCA stent 2007. Myoview 2010 normal  . Chronic obstructive bronchitis without exacerbation ([place])   . 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 Degeneration of meniscus of knee   . Derangement of left knee   . Diabetes mellitus without complication ([place])   . DM (diabetes mellitus) ([place])   . DM (diabetes mellitus) type 2, uncontrolled, with ketoacidosis ([place]) 03/12/1994   HIGH RISK FEET.. Please have patient take shoes and socks off every visit for visual foot inspection.  NPDR [facility] CSDME on 09/2011  Qualifier: Diagnosis of  By: Tomasa Hosteller MD, Edmon Crape.    . GERD (gastroesophageal reflux disease)   . HTN (hypertension)   . Hyperlipidemia   . Hypertension   . Obesity   . Vitamin D deficiency     Family History   Problem Relation Age of Onset  . Stroke Mother   . Diabetes Mother   . Hypertension Mother   . Diabetes Maternal Grandmother   . Diabetes Sister     SOCIAL HISTORY: Social History   Socioeconomic History  . Marital status: Single    Spouse name: Not on file  . Number of children: Not on file  . Years of education: Not on file  . Highest education level: Not on file  Occupational History  . Occupation: unemployed  Social Needs  . Financial resource strain: Not on file  . Food insecurity:    Worry: Not on file    Inability: Not on file  . Transportation needs:    Medical: Not on file    Non-medical: Not on file  Tobacco Use  . Smoking status: Current Every Day Smoker    Packs/day: 0.50    Years: 30.00    Pack years: 15.00    Types: Cigarettes  . Smokeless tobacco: Never Used  . Tobacco comment: Doesn't smoke all of it. "Lights up and throws it away"  Substance and Sexual Activity  . Alcohol use: No    Alcohol/week: 0.0 oz  . Drug use: No  . Sexual activity: Not on file  Lifestyle  . Physical activity:    Days per week: Not on file    Minutes per session: Not on file  . Stress: Not on file  Relationships  . Social connections:  Talks on phone: Not on file    Gets together: Not on file    Attends religious service: Not on file    Active member of club or organization: Not on file    Attends meetings of clubs or organizations: Not on file    Relationship status: Not on file  . Intimate partner violence:    Fear of current or ex partner: Not on file    Emotionally abused: Not on file    Physically abused: Not on file    Forced sexual activity: Not on file  Other Topics Concern  . Not on file  Social History Narrative   ** Merged History Encounter **       Patient recently lost her job and is currently without insurance. - Nov 2012    Allergies  Allergen Reactions  . Benazepril-Hydrochlorothiazide Other (See Comments)    UNKNOWN. Ok with lisinopril    . Chantix [Varenicline]     SUICIDAL IDEATION  . Codeine Nausea And Vomiting  . Methylprednisolone Other (See Comments)    UNKNOWN  . Statins Other (See Comments)    "walking into a hole", heavy legs, feels like she is going to fall    Current Outpatient Medications  Medication Sig Dispense Refill  . aspirin EC 81 MG tablet Take 1 tablet (81 mg total) by mouth daily. 90 tablet 3  . calcitRIOL (ROCALTROL) 0.25 MCG capsule Take 0.25 mcg by mouth daily.     . cholecalciferol (VITAMIN D) 1000 units tablet Take 1 tablet (1,000 Units total) by mouth daily. 30 tablet 0  . cloNIDine (CATAPRES) 0.1 MG tablet Take 1 tablet (0.1 mg total) by mouth 2 (two) times daily. 60 tablet 2  . furosemide (LASIX) 20 MG tablet Take 1 tablet (20 mg total) by mouth daily. 30 tablet 3  . ondansetron (ZOFRAN ODT) 4 MG disintegrating tablet Take 1 tablet (4 mg total) by mouth every 8 (eight) hours as needed for nausea or vomiting. 10 tablet 0  . pantoprazole (PROTONIX) 40 MG tablet Take 1 tablet (40 mg total) by mouth daily at 6 (six) AM. 30 tablet 2  . amLODipine (NORVASC) 10 MG tablet Take 1 tablet (10 mg total) by mouth daily. 30 tablet 11  . carvedilol (COREG) 25 MG tablet Take 1 tablet (25 mg total) by mouth 2 (two) times daily. 60 tablet 0   No current facility-administered medications for this visit.     REVIEW OF SYSTEMS:  [X]  denotes positive finding, [ ]  denotes negative finding Cardiac  Comments:  Chest pain or chest pressure:    Shortness of breath upon exertion:    Short of breath when lying flat:    Irregular heart rhythm:        Vascular    Pain in calf, thigh, or hip brought on by ambulation:    Pain in feet at night that wakes you up from your sleep:     Blood clot in your veins:    Leg swelling:         Pulmonary    Oxygen at home:    Productive cough:     Wheezing:         Neurologic    Sudden weakness in arms or legs:     Sudden numbness in arms or legs:     Sudden onset of  difficulty speaking or slurred speech:    Temporary loss of vision in one eye:     Problems with dizziness:  Gastrointestinal    Blood in stool:     Vomited blood:         Genitourinary    Burning when urinating:     Blood in urine:        Psychiatric    Major depression:         Hematologic    Bleeding problems:    Problems with blood clotting too easily:        Skin    Rashes or ulcers:        Constitutional    Fever or chills:     PHYSICAL EXAM:   Vitals:   11/04/17 1116 11/04/17 1119  BP: (!) 231/94 (!) 236/95  Pulse: 72   Resp: 16   Temp: 98.4 F (36.9 C)   TempSrc: Oral   SpO2: 100%   Weight: 111 lb (50.3 kg)   Height: 5' 5.5" (1.664 m)     GENERAL: The patient is a well-nourished female, in no acute distress. The vital signs are documented above. CARDIAC: There is a regular rate and rhythm.  VASCULAR: I do not detect carotid bruits. She has palpable brachial and radial pulses bilaterally. PULMONARY: There is good air exchange bilaterally without wheezing or rales. ABDOMEN: Soft and non-tender with normal pitched bowel sounds.  MUSCULOSKELETAL: There are no major deformities or cyanosis. NEUROLOGIC: No focal weakness or paresthesias are detected. SKIN: There are no ulcers or rashes noted. PSYCHIATRIC: The patient has a normal affect.  DATA:    ARTERIAL DUPLEX: I have independently interpreted her upper extremity arterial duplex.  On the right side there is a triphasic radial waveform.  The ulnar waveform cannot be identified.  The brachial artery measures 0.42 cm in diameter.  On the left side there is a triphasic radial and ulnar signal.  The brachial artery measures 0.38 cm in diameter.  UPPER EXTREMITY VEIN MAP: I have independently interpreted her upper extremity vein map.  On the right side the forearm and upper arm cephalic vein looks small and is likely not adequate.  The basilic vein is marginal in size also.  On the left side the  forearm cephalic vein looks small.  The upper arm cephalic vein also looks small.  The basilic vein is marginal also.  LABS: Her GFR on 08/31/2017 was 12.  Patient did have a carotid duplex scan done on 12/09/2014 which showed no evidence of carotid disease bilaterally.  MEDICAL ISSUES:   STAGE V CHRONIC KIDNEY DISEASE: This patient is right-handed and therefore I would recommend starting with access in the left arm.  It looks like her veins are marginal in size.  She might potentially be a candidate for a left brachiocephalic fistula.  There is a small chance that that were not possible she could have a basilic vein transposition.  I explained that if she does have a basilic vein transposition we would likely do this in 2 stages.  If she does not have an adequate vein for a fistula then we would place a graft.  Her GFR is 12.  I have discussed the indications for the procedure and the potential complications and all of her questions were answered.  The patient would like to think about this before scheduling her surgery.  Hopefully she will call in the near future to schedule placement of access in the left arm.  HYPERTENSION: The patient's initial blood pressure today was elevated. We repeated this and this was still elevated. We have encouraged the patient to follow  up with their primary care physician for management of their blood pressure.  Deitra Mayo [facility] [phone]

## 2017-11-06 ENCOUNTER — Other Ambulatory Visit: Payer: Self-pay | Admitting: *Deleted

## 2017-11-06 NOTE — Progress Notes (Signed)
Patient given instructions for surgery. Arrive at [facility] admitting department at 8:30 am on 11/12/17 for procedure. NPO past MN night prior and must have a driver for home. Expect a call and follow the detailed instructions received from the hospital pre-admission department about this surgery. Verbalized understanding.

## 2017-11-09 DIAGNOSIS — I120 Hypertensive chronic kidney disease with stage 5 chronic kidney disease or end stage renal disease: Secondary | ICD-10-CM | POA: Diagnosis not present

## 2017-11-09 DIAGNOSIS — D631 Anemia in chronic kidney disease: Secondary | ICD-10-CM | POA: Diagnosis not present

## 2017-11-09 DIAGNOSIS — N2581 Secondary hyperparathyroidism of renal origin: Secondary | ICD-10-CM | POA: Diagnosis not present

## 2017-11-09 DIAGNOSIS — N185 Chronic kidney disease, stage 5: Secondary | ICD-10-CM | POA: Diagnosis not present

## 2017-11-11 ENCOUNTER — Other Ambulatory Visit: Payer: Self-pay

## 2017-11-11 ENCOUNTER — Encounter (HOSPITAL_COMMUNITY): Payer: Self-pay | Admitting: *Deleted

## 2017-11-11 NOTE — Progress Notes (Addendum)
Patient verbalized understanding of instructions  Patient is diet controlled diabetic and only checks her sugars occasionally instructed patient to monitor her sugar the morning of surgery  Last cardiology visit was with Dr. Milton Ferguson in may 2019  10:47 AM Spoke with patient about blood pressure and patient was seen Monday by Dr. Malachy Chamber [facility]) for blood pressure management.  Patient stated that she has not taken her blood pressure the morning that she saw Dr. Scot Dock and that is why it was high.  I educated patient that she should make sure that she takes her pressure medications the morning of surgery.  I also explained that if her pressures are high that morning then she could be cancelled.  Patient verbalized understanding.

## 2017-11-11 NOTE — Progress Notes (Signed)
Anesthesia Chart Review:  Pt is a same day work up    Case:  [id number] Date/Time:  11/12/17 1109   Procedure:  ARTERIOVENOUS (AV) FISTULA CREATION VERSUS INSERTION OF ARTERIOVENOUS GRAFT LEFT UPPER EXTREMITY (Left )   Anesthesia type:  Monitor Anesthesia Care   Pre-op diagnosis:  CHRONIC KIDNEY DISEASE FOR HEMODIALYSIS ACCESS   Location:  [facility] OR ROOM 12 / [place] OR   Surgeon:  Angelia Mould, MD      DISCUSSION: - Pt is a 72 year old female with hx CAD (DES to RCA 2007), uncontrolled HTN, DM, CVA (2013), CKD (stage V, not yet on dialysis)  - ED visit 08/31/17 for syncope, thought to be orthostatic event   VS: Will be obtained day of surgery  - note pt's BP has been high recently.  On 11/04/17, BP 231/94, and 236/95 on recheck   PROVIDERS: - PCP is Velna Ochs, MD - Cardiologist is Lauree Chandler, MD. Last office visit 09/23/17 - Nephrologist is Lawson Radar, MD who referred pt for surgery   LABS: Will be obtained day of surgery    IMAGES:  CT chest, abd, pelvis 07/21/17:  - Coronary artery calcifications are noted suggesting coronary artery disease. - Sigmoid diverticulosis without inflammation. - Aortic Atherosclerosis    EKG 08/31/17: Sinus rhythm. Baseline wander in lead(s) II   CV:  Cardiac event monitor 09/30/17 (ordered for syncope eval):  - Sinus rhythm - No heart block, atrial fibrillation or atrial flutter. No ventricular tachycardia. No premature beats.   Echo 07/22/17:  - Left ventricle: The cavity size was normal. Wall thickness was increased in a pattern of severe LVH. Systolic function was normal. The estimated ejection fraction was in the range of 60% to 65%. Wall motion was normal; there were no regional wall motion abnormalities. Doppler parameters are consistent with abnormal left ventricular relaxation (grade 1 diastolic dysfunction). - Aortic valve: There was no stenosis. There was trivial regurgitation. - Mitral valve: Mildly calcified  annulus. There was no significant regurgitation. - Right ventricle: The cavity size was normal. Systolic function was normal. - Tricuspid valve: Peak RV-RA gradient (S): 17 mm Hg. - Pulmonary arteries: PA peak pressure: 25 mm Hg (S). - Systemic veins: IVC measured 2.2 cm with > 50% respirophasic variation, suggesting RA pressure 8 mmHg. - Pericardium, extracardiac: Small concentric pericardial effusion without tamponade. . - Impressions: Normal LV size with severe LV hypertrophy. EF 60-65%. Normal RV size and systolic function. Small circumferential pericardial effusion. 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  Cardiac cath 05/22/06:  1. LV:  146/4/14.  EF 65% without regional wall motion abnormality.  2. No aortic stenosis or mitral regurgitation.  3. LM:  Angiographically normal.  4. LAD:  gives rise to one small diagonal. 40% stenosis in the proximal vessel overlying the origin of the largest septal perforator.  5. Ramus intermedius:  Moderate-sized vessel which is angiographically normal.  6. CX:  gives rise to a single marginal. 20% stenosis of the midvessel.  7. RCA:  previously-placed stent in the midvessel  is widely patent with no in-stent restenosis. PDA small and diffusely diseased with up to a 90% stenosis in its midsection. Vessel is approximately 1 mm in diameter even after the administration of intracoronary nitroglycerin.    Past Medical History:  Diagnosis Date  . Arthritis   . AVD (aortic valve disease)   . CAD (coronary artery disease)    s/p RCA stent 2007. Myoview 2010 normal  . Chronic obstructive bronchitis  without exacerbation ([place])   . 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 Degeneration of meniscus of knee   . Derangement of left knee   . Diabetes mellitus without complication ([place])   . DM (diabetes mellitus) ([place])   . DM (diabetes mellitus) type 2, uncontrolled, with ketoacidosis  ([place]) 03/12/1994   HIGH RISK FEET.. Please have patient take shoes and socks off every visit for visual foot inspection.  NPDR OU CSDME on 09/2011  Qualifier: Diagnosis of  By: Tomasa Hosteller MD, Edmon Crape.    . GERD (gastroesophageal reflux disease)   . HTN (hypertension)   . Hyperlipidemia   . Hypertension   . Obesity   . Vitamin D deficiency     Past Surgical History:  Procedure Laterality Date  . ABDOMINAL HYSTERECTOMY    . CHOLECYSTECTOMY    . CORONARY ANGIOPLASTY WITH STENT PLACEMENT    . ESOPHAGOGASTRODUODENOSCOPY N/A 07/23/2017   Procedure: ESOPHAGOGASTRODUODENOSCOPY (EGD);  Surgeon: Milus Banister, MD;  Location: [facility];  Service: Endoscopy;  Laterality: N/A;  . KNEE SURGERY    . SHOULDER SURGERY      MEDICATIONS: No current facility-administered medications for this encounter.    Marland Kitchen amLODipine (NORVASC) 10 MG tablet  . aspirin EC 81 MG tablet  . calcitRIOL (ROCALTROL) 0.25 MCG capsule  . calcium acetate (PHOSLO) 667 MG capsule  . carvedilol (COREG) 25 MG tablet  . cloNIDine (CATAPRES) 0.1 MG tablet  . Ferrous Sulfate (IRON) 325 (65 Fe) MG TABS  . furosemide (LASIX) 20 MG tablet  . sodium bicarbonate 650 MG tablet  . spironolactone (ALDACTONE) 25 MG tablet  . cholecalciferol (VITAMIN D) 1000 units tablet  . ondansetron (ZOFRAN ODT) 4 MG disintegrating tablet  . pantoprazole (PROTONIX) 40 MG tablet    If labs and BP acceptable day of surgery, I anticipate pt can proceed with surgery as scheduled.  Willeen Cass, FNP-BC [facility] Short Stay Surgical [facility]/Anesthesiology Phone: [phone] 11/11/2017 11:03 AM

## 2017-11-12 ENCOUNTER — Encounter (HOSPITAL_COMMUNITY): Payer: Self-pay | Admitting: Surgery

## 2017-11-12 ENCOUNTER — Encounter (HOSPITAL_COMMUNITY)
Admission: RE | Disposition: A | Discharge status: Home / Self Care | Payer: Self-pay | Source: Ambulatory Visit | Attending: Vascular Surgery

## 2017-11-12 ENCOUNTER — Ambulatory Visit (HOSPITAL_COMMUNITY)
Admission: RE | Admit: 2017-11-12 | Discharge: 2017-11-12 | Disposition: A | Discharge status: Home / Self Care | Payer: Medicare Other | Source: Ambulatory Visit | Attending: Vascular Surgery | Admitting: Vascular Surgery

## 2017-11-12 ENCOUNTER — Ambulatory Visit (HOSPITAL_COMMUNITY): Payer: Medicare Other | Admitting: Emergency Medicine

## 2017-11-12 ENCOUNTER — Telehealth: Payer: Self-pay | Admitting: Vascular Surgery

## 2017-11-12 DIAGNOSIS — Z823 Family history of stroke: Secondary | ICD-10-CM | POA: Insufficient documentation

## 2017-11-12 DIAGNOSIS — E1122 Type 2 diabetes mellitus with diabetic chronic kidney disease: Secondary | ICD-10-CM | POA: Insufficient documentation

## 2017-11-12 DIAGNOSIS — I120 Hypertensive chronic kidney disease with stage 5 chronic kidney disease or end stage renal disease: Secondary | ICD-10-CM | POA: Diagnosis not present

## 2017-11-12 DIAGNOSIS — E1110 Type 2 diabetes mellitus with ketoacidosis without coma: Secondary | ICD-10-CM | POA: Insufficient documentation

## 2017-11-12 DIAGNOSIS — I2510 Atherosclerotic heart disease of native coronary artery without angina pectoris: Secondary | ICD-10-CM | POA: Insufficient documentation

## 2017-11-12 DIAGNOSIS — Z833 Family history of diabetes mellitus: Secondary | ICD-10-CM | POA: Insufficient documentation

## 2017-11-12 DIAGNOSIS — Z8673 Personal history of transient ischemic attack (TIA), and cerebral infarction without residual deficits: Secondary | ICD-10-CM | POA: Insufficient documentation

## 2017-11-12 DIAGNOSIS — Z992 Dependence on renal dialysis: Secondary | ICD-10-CM | POA: Diagnosis not present

## 2017-11-12 DIAGNOSIS — Z885 Allergy status to narcotic agent status: Secondary | ICD-10-CM | POA: Diagnosis not present

## 2017-11-12 DIAGNOSIS — N184 Chronic kidney disease, stage 4 (severe): Secondary | ICD-10-CM | POA: Insufficient documentation

## 2017-11-12 DIAGNOSIS — Z888 Allergy status to other drugs, medicaments and biological substances status: Secondary | ICD-10-CM | POA: Insufficient documentation

## 2017-11-12 DIAGNOSIS — I129 Hypertensive chronic kidney disease with stage 1 through stage 4 chronic kidney disease, or unspecified chronic kidney disease: Secondary | ICD-10-CM | POA: Diagnosis not present

## 2017-11-12 DIAGNOSIS — K219 Gastro-esophageal reflux disease without esophagitis: Secondary | ICD-10-CM | POA: Diagnosis not present

## 2017-11-12 DIAGNOSIS — Z8249 Family history of ischemic heart disease and other diseases of the circulatory system: Secondary | ICD-10-CM | POA: Diagnosis not present

## 2017-11-12 DIAGNOSIS — Z79899 Other long term (current) drug therapy: Secondary | ICD-10-CM | POA: Insufficient documentation

## 2017-11-12 DIAGNOSIS — Z955 Presence of coronary angioplasty implant and graft: Secondary | ICD-10-CM | POA: Diagnosis not present

## 2017-11-12 DIAGNOSIS — E559 Vitamin D deficiency, unspecified: Secondary | ICD-10-CM | POA: Insufficient documentation

## 2017-11-12 DIAGNOSIS — E669 Obesity, unspecified: Secondary | ICD-10-CM | POA: Insufficient documentation

## 2017-11-12 DIAGNOSIS — N185 Chronic kidney disease, stage 5: Secondary | ICD-10-CM | POA: Diagnosis not present

## 2017-11-12 DIAGNOSIS — M1990 Unspecified osteoarthritis, unspecified site: Secondary | ICD-10-CM | POA: Diagnosis not present

## 2017-11-12 DIAGNOSIS — E785 Hyperlipidemia, unspecified: Secondary | ICD-10-CM | POA: Insufficient documentation

## 2017-11-12 DIAGNOSIS — F17210 Nicotine dependence, cigarettes, uncomplicated: Secondary | ICD-10-CM | POA: Diagnosis not present

## 2017-11-12 DIAGNOSIS — Z7982 Long term (current) use of aspirin: Secondary | ICD-10-CM | POA: Diagnosis not present

## 2017-11-12 HISTORY — PX: AV FISTULA PLACEMENT: SHX1204

## 2017-11-12 LAB — POCT I-STAT 4, (NA,K, GLUC, HGB,HCT)
Glucose, Bld: 88 mg/dL (ref 70–99)
HCT: 23 % — ABNORMAL LOW (ref 36.0–46.0)
HEMOGLOBIN: 7.8 g/dL — AB (ref 12.0–15.0)
Potassium: 3.7 mmol/L (ref 3.5–5.1)
Sodium: 142 mmol/L (ref 135–145)

## 2017-11-12 LAB — GLUCOSE, CAPILLARY: Glucose-Capillary: 89 mg/dL (ref 70–99)

## 2017-11-12 SURGERY — ARTERIOVENOUS (AV) FISTULA CREATION
Anesthesia: Monitor Anesthesia Care | Site: Arm Upper | Laterality: Left

## 2017-11-12 MED ORDER — OXYCODONE HCL 5 MG PO TABS: 5.0000 mg | ORAL_TABLET | ORAL | 0 refills | Status: DC | PRN

## 2017-11-12 MED ORDER — FENTANYL CITRATE (PF) 250 MCG/5ML IJ SOLN
INTRAMUSCULAR | Status: AC
  Filled 2017-11-12: qty 5

## 2017-11-12 MED ORDER — SODIUM CHLORIDE 0.9 % IV SOLN: INTRAVENOUS | Status: DC

## 2017-11-12 MED ORDER — PROTAMINE SULFATE 10 MG/ML IV SOLN
INTRAVENOUS | Status: DC | PRN
  Administered 2017-11-12: 10 mg via INTRAVENOUS
  Administered 2017-11-12: 15 mg via INTRAVENOUS

## 2017-11-12 MED ORDER — 0.9 % SODIUM CHLORIDE (POUR BTL) OPTIME
TOPICAL | Status: DC | PRN
  Administered 2017-11-12: 1000 mL

## 2017-11-12 MED ORDER — CEFAZOLIN SODIUM-DEXTROSE 2-4 GM/100ML-% IV SOLN
2.0000 g | INTRAVENOUS | Status: AC
  Administered 2017-11-12: 2 g via INTRAVENOUS

## 2017-11-12 MED ORDER — CEFAZOLIN SODIUM-DEXTROSE 2-4 GM/100ML-% IV SOLN
INTRAVENOUS | Status: AC
  Filled 2017-11-12: qty 100

## 2017-11-12 MED ORDER — PROPOFOL 10 MG/ML IV BOLUS
INTRAVENOUS | Status: DC | PRN
  Administered 2017-11-12: 15 mg via INTRAVENOUS
  Administered 2017-11-12: 20 mg via INTRAVENOUS

## 2017-11-12 MED ORDER — SODIUM CHLORIDE 0.9 % IV SOLN
INTRAVENOUS | Status: DC | PRN
  Administered 2017-11-12: 12:00:00

## 2017-11-12 MED ORDER — CHLORHEXIDINE GLUCONATE 4 % EX LIQD: 60.0000 mL | Freq: Once | CUTANEOUS | Status: DC

## 2017-11-12 MED ORDER — FENTANYL CITRATE (PF) 100 MCG/2ML IJ SOLN
INTRAMUSCULAR | Status: DC | PRN
  Administered 2017-11-12: 25 ug via INTRAVENOUS

## 2017-11-12 MED ORDER — PHENYLEPHRINE HCL 10 MG/ML IJ SOLN
INTRAMUSCULAR | Status: DC | PRN
  Administered 2017-11-12: 35 ug/min via INTRAVENOUS

## 2017-11-12 MED ORDER — SODIUM CHLORIDE 0.9 % IV SOLN
INTRAVENOUS | Status: DC | PRN
  Administered 2017-11-12: 10:00:00 via INTRAVENOUS

## 2017-11-12 MED ORDER — PROPOFOL 10 MG/ML IV BOLUS
INTRAVENOUS | Status: AC
  Filled 2017-11-12: qty 20

## 2017-11-12 MED ORDER — ONDANSETRON HCL 4 MG/2ML IJ SOLN
INTRAMUSCULAR | Status: DC | PRN
  Administered 2017-11-12: 4 mg via INTRAVENOUS

## 2017-11-12 MED ORDER — PROPOFOL 500 MG/50ML IV EMUL
INTRAVENOUS | Status: DC | PRN
  Administered 2017-11-12: 75 ug/kg/min via INTRAVENOUS

## 2017-11-12 MED ORDER — SODIUM CHLORIDE 0.9 % IV SOLN
INTRAVENOUS | Status: AC
  Filled 2017-11-12: qty 1.2

## 2017-11-12 MED ORDER — LIDOCAINE-EPINEPHRINE (PF) 1 %-1:200000 IJ SOLN
INTRAMUSCULAR | Status: AC
  Filled 2017-11-12: qty 30

## 2017-11-12 MED ORDER — LIDOCAINE-EPINEPHRINE (PF) 1 %-1:200000 IJ SOLN
INTRAMUSCULAR | Status: DC | PRN
  Administered 2017-11-12: 10 mL

## 2017-11-12 MED ORDER — HEPARIN SODIUM (PORCINE) 1000 UNIT/ML IJ SOLN
INTRAMUSCULAR | Status: DC | PRN
  Administered 2017-11-12: 5000 [IU] via INTRAVENOUS

## 2017-11-12 MED ORDER — HEPARIN SODIUM (PORCINE) 1000 UNIT/ML IJ SOLN
INTRAMUSCULAR | Status: AC
Start: 2017-11-12 — End: ?
  Filled 2017-11-12: qty 3

## 2017-11-12 SURGICAL SUPPLY — 33 items
ARMBAND PINK RESTRICT EXTREMIT (MISCELLANEOUS) ×6 IMPLANT
CANISTER SUCT 3000ML PPV (MISCELLANEOUS) ×3 IMPLANT
CANNULA VESSEL 3MM 2 BLNT TIP (CANNULA) ×3 IMPLANT
CLIP VESOCCLUDE MED 6/CT (CLIP) ×3 IMPLANT
CLIP VESOCCLUDE SM WIDE 6/CT (CLIP) ×3 IMPLANT
COVER PROBE W GEL 5X96 (DRAPES) IMPLANT
DECANTER SPIKE VIAL GLASS SM (MISCELLANEOUS) ×3 IMPLANT
DERMABOND ADVANCED (GAUZE/BANDAGES/DRESSINGS) ×2
DERMABOND ADVANCED .7 DNX12 (GAUZE/BANDAGES/DRESSINGS) ×1 IMPLANT
ELECT REM PT RETURN 9FT ADLT (ELECTROSURGICAL) ×3
ELECTRODE REM PT RTRN 9FT ADLT (ELECTROSURGICAL) ×1 IMPLANT
GLOVE BIO SURGEON STRL SZ 6.5 (GLOVE) ×2 IMPLANT
GLOVE BIO SURGEON STRL SZ7.5 (GLOVE) ×3 IMPLANT
GLOVE BIO SURGEONS STRL SZ 6.5 (GLOVE) ×1
GLOVE BIOGEL PI IND STRL 6.5 (GLOVE) ×1 IMPLANT
GLOVE BIOGEL PI IND STRL 8 (GLOVE) ×1 IMPLANT
GLOVE BIOGEL PI INDICATOR 6.5 (GLOVE) ×2
GLOVE BIOGEL PI INDICATOR 8 (GLOVE) ×2
GOWN STRL REUS W/ TWL LRG LVL3 (GOWN DISPOSABLE) ×3 IMPLANT
GOWN STRL REUS W/TWL LRG LVL3 (GOWN DISPOSABLE) ×6
KIT BASIN OR (CUSTOM PROCEDURE TRAY) ×3 IMPLANT
KIT TURNOVER KIT B (KITS) ×3 IMPLANT
NS IRRIG 1000ML POUR BTL (IV SOLUTION) ×3 IMPLANT
PACK CV ACCESS (CUSTOM PROCEDURE TRAY) ×3 IMPLANT
PAD ARMBOARD 7.5X6 YLW CONV (MISCELLANEOUS) ×6 IMPLANT
SPONGE SURGIFOAM ABS GEL 100 (HEMOSTASIS) IMPLANT
SUT PROLENE 6 0 BV (SUTURE) ×3 IMPLANT
SUT VIC AB 3-0 SH 27 (SUTURE) ×2
SUT VIC AB 3-0 SH 27X BRD (SUTURE) ×1 IMPLANT
SUT VICRYL 4-0 PS2 18IN ABS (SUTURE) ×3 IMPLANT
TOWEL GREEN STERILE (TOWEL DISPOSABLE) ×3 IMPLANT
UNDERPAD 30X30 (UNDERPADS AND DIAPERS) ×3 IMPLANT
WATER STERILE IRR 1000ML POUR (IV SOLUTION) ×3 IMPLANT

## 2017-11-12 NOTE — Interval H&P Note (Signed)
History and Physical Interval Note:  11/12/2017 10:58 AM  Stephanie Gross  has presented today for surgery, with the diagnosis of CHRONIC KIDNEY DISEASE FOR HEMODIALYSIS ACCESS  The various methods of treatment have been discussed with the patient and family. After consideration of risks, benefits and other options for treatment, the patient has consented to  Procedure(s): ARTERIOVENOUS (AV) FISTULA CREATION VERSUS INSERTION OF ARTERIOVENOUS GRAFT LEFT UPPER EXTREMITY (Left) as a surgical intervention .  The patient's history has been reviewed, patient examined, no change in status, stable for surgery.  I have reviewed the patient's chart and labs.  Questions were answered to the patient's satisfaction.     Deitra Mayo

## 2017-11-12 NOTE — Anesthesia Procedure Notes (Signed)
Procedure Name: MAC Date/Time: 11/12/2017 11:35 AM Performed by: Neldon Newport, CRNA Pre-anesthesia Checklist: Timeout performed, Patient being monitored, Suction available, Emergency Drugs available and Patient identified Oxygen Delivery Method: Simple face mask Placement Confirmation: breath sounds checked- equal and bilateral and positive ETCO2

## 2017-11-12 NOTE — Telephone Encounter (Signed)
sch appt spk to pt 12/23/17 10am Dialysis Duplex 1045am p/o MD

## 2017-11-12 NOTE — Anesthesia Postprocedure Evaluation (Signed)
Anesthesia Post Note  Patient: Stephanie Gross  Procedure(s) Performed: BRACHIOCEPHALIC ARTERIOVENOUS (AV) FISTULA CREATION LEFT UPPER EXTREMITY (Left Arm Upper)     Patient location during evaluation: PACU Anesthesia Type: MAC Level of consciousness: awake and alert Pain management: pain level controlled Vital Signs Assessment: post-procedure vital signs reviewed and stable Respiratory status: spontaneous breathing, nonlabored ventilation, respiratory function stable and patient connected to nasal cannula oxygen Cardiovascular status: stable and blood pressure returned to baseline Postop Assessment: no apparent nausea or vomiting Anesthetic complications: no    Last Vitals:  Vitals:   11/12/17 1320 11/12/17 1335  BP: 101/67   Pulse: 60   Resp: 11   Temp:  36.4 C  SpO2: 100%     Last Pain:  Vitals:   11/12/17 1335  TempSrc:   PainSc: 0-No pain                 Aragon Scarantino COKER

## 2017-11-12 NOTE — Anesthesia Preprocedure Evaluation (Signed)
Anesthesia Evaluation  Patient identified by MRN, date of birth, ID band Patient awake    Reviewed: Allergy & Precautions, NPO status , Patient's Chart, lab work & pertinent test results  Airway Mallampati: II  TM Distance: >3 FB     Dental   Pulmonary Current Smoker,    breath sounds clear to auscultation       Cardiovascular hypertension,  Rhythm:Regular Rate:Normal     Neuro/Psych    GI/Hepatic   Endo/Other    Renal/GU      Musculoskeletal   Abdominal   Peds  Hematology   Anesthesia Other Findings   Reproductive/Obstetrics                             Anesthesia Physical Anesthesia Plan  ASA: III  Anesthesia Plan: MAC   Post-op Pain Management:    Induction:   PONV Risk Score and Plan: Ondansetron  Airway Management Planned: Natural Airway and Simple Face Mask  Additional Equipment:   Intra-op Plan:   Post-operative Plan:   Informed Consent: I have reviewed the patients History and Physical, chart, labs and discussed the procedure including the risks, benefits and alternatives for the proposed anesthesia with the patient or authorized representative who has indicated his/her understanding and acceptance.     Plan Discussed with: CRNA and Anesthesiologist  Anesthesia Plan Comments:         Anesthesia Quick Evaluation

## 2017-11-12 NOTE — Transfer of Care (Signed)
Immediate Anesthesia Transfer of Care Note  Patient: Stephanie Gross  Procedure(s) Performed: BRACHIOCEPHALIC ARTERIOVENOUS (AV) FISTULA CREATION LEFT UPPER EXTREMITY (Left Arm Upper)  Patient Location: PACU  Anesthesia Type:MAC  Level of Consciousness: awake, alert  and oriented  Airway & Oxygen Therapy: Patient Spontanous Breathing  Post-op Assessment: Report given to RN, Post -op Vital signs reviewed and stable and Patient moving all extremities X 4  Post vital signs: Reviewed and stable  Last Vitals:  Vitals Value Taken Time  BP    Temp    Pulse    Resp    SpO2      Last Pain:  Vitals:   11/12/17 0915  TempSrc:   PainSc: 0-No pain      Patients Stated Pain Goal: 3 (03/75/43 6067)  Complications: No apparent anesthesia complications

## 2017-11-12 NOTE — Op Note (Signed)
    NAME: [name]    MRN: [id number] DOB: 20-Sep-1945    DATE OF OPERATION: 11/12/2017  PREOP DIAGNOSIS:    Stage IV chronic kidney disease  POSTOP DIAGNOSIS:    Same  PROCEDURE:    Left ulnar-cephalic AV fistula  SURGEON: Judeth Cornfield. Scot Dock, MD, FACS  ASSIST: Arlee Muslim, PA, Kristeen Mans, RNFA  ANESTHESIA: Local with sedation  EBL: Minimal  INDICATIONS:    Stephanie Gross is a 72 y.o. female who presents for new access.  FINDINGS:   3.5 mm upper arm cephalic vein  TECHNIQUE:   The patient was taken to the operating room and sedated by anesthesia.  The left arm was prepped and draped in usual sterile fashion.  After the skin was anesthetized with 1% lidocaine, a transverse incision was made just above the antecubital level.  Here the cephalic vein was dissected free and was ligated distally.  It was irrigated up nicely with heparinized saline.  The artery was dissected free beneath the fascia.  The artery was quite small and upon interrogation with a Doppler was clear that this was the radial artery.  Therefore slightly more laterally identified the ulnar artery which was significantly larger.  I felt that this was adequate inflow.  The patient was heparinized.  The ulnar artery was clamped proximally and distally and a longitudinal arteriotomy was made.  The vein was slightly spatulated and sewn into side to the ulnar artery using continuous 6-0 Prolene suture.  At the completion there was an excellent thrill in the fistula.  There was a good radial and ulnar signal with the Doppler.  The heparin was partially reversed with protamine.  The wound was then closed with a deep layer of 3-0 Vicryl and the skin closed with 4-0 Vicryl.  Dermabond was applied.  The patient tolerated the procedure well and was transferred to the recovery room in stable condition.  Deitra Mayo, MD, FACS [facility]  DATE OF DICTATION:    11/12/2017

## 2017-11-13 ENCOUNTER — Encounter (HOSPITAL_COMMUNITY): Payer: Self-pay | Admitting: Vascular Surgery

## 2017-11-16 ENCOUNTER — Other Ambulatory Visit: Payer: Self-pay

## 2017-11-16 DIAGNOSIS — N185 Chronic kidney disease, stage 5: Secondary | ICD-10-CM

## 2017-11-16 DIAGNOSIS — Z48812 Encounter for surgical aftercare following surgery on the circulatory system: Secondary | ICD-10-CM

## 2017-12-08 DIAGNOSIS — N2581 Secondary hyperparathyroidism of renal origin: Secondary | ICD-10-CM | POA: Diagnosis not present

## 2017-12-08 DIAGNOSIS — I770 Arteriovenous fistula, acquired: Secondary | ICD-10-CM | POA: Diagnosis not present

## 2017-12-08 DIAGNOSIS — D631 Anemia in chronic kidney disease: Secondary | ICD-10-CM | POA: Diagnosis not present

## 2017-12-08 DIAGNOSIS — I120 Hypertensive chronic kidney disease with stage 5 chronic kidney disease or end stage renal disease: Secondary | ICD-10-CM | POA: Diagnosis not present

## 2017-12-08 DIAGNOSIS — N185 Chronic kidney disease, stage 5: Secondary | ICD-10-CM | POA: Diagnosis not present

## 2017-12-09 ENCOUNTER — Ambulatory Visit: Payer: Medicare Other | Admitting: Pharmacist

## 2017-12-10 ENCOUNTER — Ambulatory Visit: Payer: Medicare Other | Admitting: Pharmacist

## 2017-12-10 DIAGNOSIS — Z79899 Other long term (current) drug therapy: Secondary | ICD-10-CM

## 2017-12-10 NOTE — Progress Notes (Signed)
S: Stephanie Gross is a 72 y.o. female reports to clinical pharmacist appointment for medication review.  Allergies  Allergen Reactions  . Benazepril-Hydrochlorothiazide Other (See Comments)    UNKNOWN. Ok with lisinopril  . Chantix [Varenicline] Other (See Comments)    SUICIDAL IDEATION  . Codeine Nausea And Vomiting  . Methylprednisolone Other (See Comments)    UNKNOWN  . Statins Other (See Comments)    "walking into a hole", heavy legs, feels like she is going to fall   Medication Sig  amLODipine (NORVASC) 10 MG tablet Take 1 tablet (10 mg total) by mouth daily.  aspirin EC 81 MG tablet Take 1 tablet (81 mg total) by mouth daily.  calcitRIOL (ROCALTROL) 0.25 MCG capsule Take 0.25 mcg by mouth daily.   calcium acetate (PHOSLO) 667 MG capsule Take 667 mg by mouth daily.  carvedilol (COREG) 25 MG tablet Take 1 tablet (25 mg total) by mouth 2 (two) times daily.  cholecalciferol (VITAMIN D) 1000 units tablet Take 1 tablet (1,000 Units total) by mouth daily.  cloNIDine (CATAPRES) 0.1 MG tablet Take 1 tablet (0.1 mg total) by mouth 2 (two) times daily.  Ferrous Sulfate (IRON) 325 (65 Fe) MG TABS Take 325 mg by mouth daily.  furosemide (LASIX) 20 MG tablet Take 1 tablet (20 mg total) by mouth daily. Patient taking differently: Take 40 mg by mouth daily.   oxyCODONE (ROXICODONE) 5 MG immediate release tablet Take 1 tablet (5 mg total) by mouth every 4 (four) hours as needed for severe pain.  sodium bicarbonate 650 MG tablet Take 1,300 mg by mouth daily.  spironolactone (ALDACTONE) 25 MG tablet Take 25 mg by mouth daily.   Past Medical History:  Diagnosis Date  . Arthritis   . AVD (aortic valve disease)   . CAD (coronary artery disease)    s/p RCA stent 2007. Myoview 2010 normal  . Chronic obstructive bronchitis without exacerbation ([place])   . 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 Degeneration of meniscus of knee   . Derangement  of left knee   . GERD (gastroesophageal reflux disease)   . HTN (hypertension)   . Hyperlipidemia   . Hypertension   . Obesity   . Vitamin D deficiency    Social History   Socioeconomic History  . Marital status: Single    Spouse name: Not on file  . Number of children: Not on file  . Years of education: Not on file  . Highest education level: Not on file  Occupational History  . Occupation: unemployed  Social Needs  . Financial resource strain: Not on file  . Food insecurity:    Worry: Not on file    Inability: Not on file  . Transportation needs:    Medical: Not on file    Non-medical: Not on file  Tobacco Use  . Smoking status: Current Some Day Smoker    Packs/day: 0.25    Years: 30.00    Pack years: 7.50    Types: Cigarettes  . Smokeless tobacco: Never Used  . Tobacco comment: Doesn't smoke all of it. "Lights up and throws it away"  Substance and Sexual Activity  . Alcohol use: No    Alcohol/week: 0.0 standard drinks  . Drug use: No  . Sexual activity: Not on file  Lifestyle  . Physical activity:    Days per week: Not on file    Minutes per session: Not on file  . Stress: Not  on file  Relationships  . Social connections:    Talks on phone: Not on file    Gets together: Not on file    Attends religious service: Not on file    Active member of club or organization: Not on file    Attends meetings of clubs or organizations: Not on file    Relationship status: Not on file  Other Topics Concern  . Not on file  Social History Narrative   ** Merged History Encounter **       Patient recently lost her job and is currently without insurance. - Nov 2012   Family History  Problem Relation Age of Onset  . Stroke Mother   . Diabetes Mother   . Hypertension Mother   . Diabetes Maternal Grandmother   . Diabetes Sister    O: Component Value Date/Time   CHOL 183 07/21/2017 1415   CHOL 207 (H) 05/21/2015 1550   HDL 57 07/21/2017 1415   HDL 55 05/21/2015 1550    TRIG 129 07/21/2017 1415   AST 7 (L) 07/23/2017 0554   ALT 7 (L) 07/23/2017 0554   NA 142 11/12/2017 0947   NA 141 08/17/2017 1625   K 3.7 11/12/2017 0947   CL 111 08/31/2017 1449   CO2 18 (L) 08/31/2017 1449   GLUCOSE 88 11/12/2017 0947   HGBA1C 5.2 07/21/2017 1416   BUN 40 (H) 08/31/2017 1449   BUN 26 08/17/2017 1625   CREATININE 4.05 (H) 08/31/2017 1449   CREATININE 1.17 (H) 10/22/2012 1324   CALCIUM 8.1 (L) 08/31/2017 1449   CALCIUM 10.0 02/07/2012 1505   GFRNONAA 10 (L) 08/31/2017 1449   GFRNONAA 49 (L) 10/22/2012 1324   GFRAA 12 (L) 08/31/2017 1449   GFRAA 57 (L) 10/22/2012 1324   WBC 5.5 08/31/2017 1449   HGB 7.8 (L) 11/12/2017 0947   HGB 9.3 (L) 08/03/2017 1635   HCT 23.0 (L) 11/12/2017 0947   HCT 29.0 (L) 08/03/2017 1635   PLT 300 08/31/2017 1449   PLT 231 08/03/2017 1635   TSH 0.631 07/21/2017 2038   TSH 0.666 06/04/2015 1619   Ht Readings from Last 2 Encounters:  11/04/17 5' 5.5" (1.664 m)  09/23/17 5' 5.5" (1.664 m)   Wt Readings from Last 2 Encounters:  11/04/17 111 lb (50.3 kg)  09/23/17 117 lb 12.8 oz (53.4 kg)   There is no height or weight on file to calculate BMI. BP Readings from Last 3 Encounters:  11/12/17 101/67  11/04/17 (!) 236/95  09/23/17 132/60   A/P: medications were reviewed with the patient, including name, instructions, indication, goals of therapy, potential side effects, importance of adherence, and safe use. Medication list was updated. Patient reports concern today for polypharmacy. Discontinued pantoprazole and ondansetron per patient preference, however did not recommend any changes for now until patient establishes new PCP.  Patient verbalized understanding by repeating back information and was advised to contact me if medication-related questions arise. Patient was also provided an information handout.

## 2017-12-14 ENCOUNTER — Telehealth: Payer: Self-pay | Admitting: Internal Medicine

## 2017-12-14 NOTE — Telephone Encounter (Signed)
Stephanie Gross is checking medicine, pt contact# [phone]

## 2017-12-15 ENCOUNTER — Telehealth: Payer: Self-pay | Admitting: Student-PharmD

## 2017-12-15 DIAGNOSIS — I10 Essential (primary) hypertension: Secondary | ICD-10-CM

## 2017-12-15 MED ORDER — CARVEDILOL 25 MG PO TABS: 25.0000 mg | ORAL_TABLET | Freq: Two times a day (BID) | ORAL | 0 refills | Status: DC

## 2017-12-15 MED ORDER — AMLODIPINE BESYLATE 10 MG PO TABS
10.0000 mg | ORAL_TABLET | Freq: Every day | ORAL | 0 refills | Status: DC
Start: 2017-12-15 — End: 2017-12-25

## 2017-12-16 NOTE — Telephone Encounter (Signed)
Patient has an appointment scheduled on 12/21/17 at 3:45 pm in [facility].

## 2017-12-21 ENCOUNTER — Ambulatory Visit: Payer: Medicare Other

## 2017-12-23 ENCOUNTER — Ambulatory Visit (INDEPENDENT_AMBULATORY_CARE_PROVIDER_SITE_OTHER): Payer: Medicare Other | Admitting: Vascular Surgery

## 2017-12-23 ENCOUNTER — Ambulatory Visit (HOSPITAL_COMMUNITY)
Admission: RE | Admit: 2017-12-23 | Discharge: 2017-12-23 | Disposition: A | Discharge status: Home / Self Care | Payer: Medicare Other | Source: Ambulatory Visit | Attending: Vascular Surgery | Admitting: Vascular Surgery

## 2017-12-23 ENCOUNTER — Encounter: Payer: Self-pay | Admitting: Vascular Surgery

## 2017-12-23 VITALS — BP 191/75 | HR 74 | Temp 99.0°F | Resp 16 | Ht 65.5 in | Wt 118.0 lb

## 2017-12-23 DIAGNOSIS — Z48812 Encounter for surgical aftercare following surgery on the circulatory system: Secondary | ICD-10-CM | POA: Diagnosis not present

## 2017-12-23 DIAGNOSIS — N185 Chronic kidney disease, stage 5: Secondary | ICD-10-CM

## 2017-12-23 NOTE — Progress Notes (Signed)
Patient name: Stephanie Gross MRN: [id number] DOB: 03-17-1946 Sex: female  REASON FOR VISIT:   Follow-up after left ulnar cephalic AV fistula  HPI:   Stephanie Gross is a pleasant 72 y.o. female who presented for new access.  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  On 11/12/2017 she underwent a left ulnar cephalic fistula.  The vein was noted to be 3.5 mm.  The patient has no specific complaints.  She denies pain or paresthesias in her left arm.  Current Outpatient Medications  Medication Sig Dispense Refill  . amLODipine (NORVASC) 10 MG tablet Take 1 tablet (10 mg total) by mouth daily. 30 tablet 0  . aspirin EC 81 MG tablet Take 1 tablet (81 mg total) by mouth daily. 90 tablet 3  . calcitRIOL (ROCALTROL) 0.25 MCG capsule Take 0.25 mcg by mouth daily.     . calcium acetate (PHOSLO) 667 MG capsule Take 667 mg by mouth daily.  6  . carvedilol (COREG) 25 MG tablet Take 1 tablet (25 mg total) by mouth 2 (two) times daily. 60 tablet 0  . cholecalciferol (VITAMIN D) 1000 units tablet Take 1 tablet (1,000 Units total) by mouth daily. 30 tablet 0  . cloNIDine (CATAPRES) 0.1 MG tablet Take 1 tablet (0.1 mg total) by mouth 2 (two) times daily. 60 tablet 2  . Ferrous Sulfate (IRON) 325 (65 Fe) MG TABS Take 325 mg by mouth daily.  4  . furosemide (LASIX) 20 MG tablet Take 1 tablet (20 mg total) by mouth daily. (Patient taking differently: Take 40 mg by mouth daily. ) 30 tablet 3  . oxyCODONE (ROXICODONE) 5 MG immediate release tablet Take 1 tablet (5 mg total) by mouth every 4 (four) hours as needed for severe pain. 15 tablet 0  . sodium bicarbonate 650 MG tablet Take 1,300 mg by mouth daily.  6  . spironolactone (ALDACTONE) 25 MG tablet Take 25 mg by mouth daily.     No current facility-administered medications for this visit.     REVIEW OF SYSTEMS:  [X]  denotes positive finding, [ ]  denotes negative finding Vascular    Leg swelling    Cardiac    Chest pain or chest  pressure:    Shortness of breath upon exertion:    Short of breath when lying flat:    Irregular heart rhythm:    Constitutional    Fever or chills:     PHYSICAL EXAM:   Vitals:   12/23/17 1033 12/23/17 1037  BP: (!) 202/77 (!) 191/75  Pulse: 74   Resp: 16   Temp: 99 F (37.2 C)   TempSrc: Oral   SpO2: 99%   Weight: 118 lb (53.5 kg)   Height: 5' 5.5" (1.664 m)     GENERAL: The patient is a well-nourished female, in no acute distress. The vital signs are documented above. CARDIOVASCULAR: There is a regular rate and rhythm. PULMONARY: There is good air exchange bilaterally without wheezing or rales. Her fistula has an excellent thrill and appears to be enlarging nicely. She has a palpable left radial pulse.  DATA:   Duplex of AV fistula: I have independently interpreted the duplex of her AV fistula.  The diameters of the fistula ranged from 0.53-0.58 cm.  Depth range from 0.2-0.38 cm.  MEDICAL ISSUES:   STATUS POST LEFT ULNAR CEPHALIC AV FISTULA: Her fistula appears to be maturing adequately and I think it can be accessed in late October.  I will see her  back as needed.  HYPERTENSION: The patient's initial blood pressure today was elevated. We repeated this and this was still elevated. We have encouraged the patient to follow up with their primary care physician for management of their blood pressure.

## 2017-12-24 ENCOUNTER — Other Ambulatory Visit (HOSPITAL_COMMUNITY): Payer: Self-pay

## 2017-12-25 ENCOUNTER — Encounter (HOSPITAL_COMMUNITY)
Admission: RE | Admit: 2017-12-25 | Discharge: 2017-12-25 | Disposition: A | Discharge status: Home / Self Care | Payer: Medicare Other | Source: Ambulatory Visit | Attending: Nephrology | Admitting: Nephrology

## 2017-12-25 ENCOUNTER — Other Ambulatory Visit: Payer: Self-pay | Admitting: Pharmacist

## 2017-12-25 DIAGNOSIS — D631 Anemia in chronic kidney disease: Secondary | ICD-10-CM | POA: Insufficient documentation

## 2017-12-25 DIAGNOSIS — N184 Chronic kidney disease, stage 4 (severe): Principal | ICD-10-CM

## 2017-12-25 DIAGNOSIS — I10 Essential (primary) hypertension: Secondary | ICD-10-CM

## 2017-12-25 DIAGNOSIS — N185 Chronic kidney disease, stage 5: Secondary | ICD-10-CM | POA: Diagnosis not present

## 2017-12-25 DIAGNOSIS — Z79899 Other long term (current) drug therapy: Secondary | ICD-10-CM | POA: Insufficient documentation

## 2017-12-25 DIAGNOSIS — Z5181 Encounter for therapeutic drug level monitoring: Secondary | ICD-10-CM | POA: Diagnosis not present

## 2017-12-25 DIAGNOSIS — I129 Hypertensive chronic kidney disease with stage 1 through stage 4 chronic kidney disease, or unspecified chronic kidney disease: Secondary | ICD-10-CM

## 2017-12-25 LAB — POCT HEMOGLOBIN-HEMACUE: HEMOGLOBIN: 7.6 g/dL — AB (ref 12.0–15.0)

## 2017-12-25 MED ORDER — CLONIDINE HCL 0.1 MG PO TABS: 0.1000 mg | ORAL_TABLET | Freq: Two times a day (BID) | ORAL | 2 refills | Status: DC

## 2017-12-25 MED ORDER — EPOETIN ALFA-EPBX 10000 UNIT/ML IJ SOLN
10000.0000 [IU] | Freq: Once | INTRAMUSCULAR | Status: AC
  Administered 2017-12-25: 10000 [IU] via SUBCUTANEOUS
  Filled 2017-12-25: qty 1

## 2017-12-25 MED ORDER — FUROSEMIDE 20 MG PO TABS: 20.0000 mg | ORAL_TABLET | Freq: Every day | ORAL | 3 refills | Status: AC

## 2017-12-25 MED ORDER — CARVEDILOL 25 MG PO TABS: 25.0000 mg | ORAL_TABLET | Freq: Two times a day (BID) | ORAL | 0 refills | Status: AC

## 2017-12-25 MED ORDER — AMLODIPINE BESYLATE 10 MG PO TABS: 10.0000 mg | ORAL_TABLET | Freq: Every day | ORAL | 0 refills | Status: AC

## 2017-12-25 MED ORDER — SPIRONOLACTONE 25 MG PO TABS: 25.0000 mg | ORAL_TABLET | Freq: Every day | ORAL | 3 refills | Status: AC

## 2017-12-25 MED ORDER — CALCITRIOL 0.25 MCG PO CAPS: 0.2500 ug | ORAL_CAPSULE | Freq: Every day | ORAL | 3 refills | Status: AC

## 2017-12-25 MED ORDER — SODIUM BICARBONATE 650 MG PO TABS: 1300.0000 mg | ORAL_TABLET | Freq: Every day | ORAL | 6 refills | Status: AC

## 2017-12-25 NOTE — Discharge Instructions (Signed)
Epoetin Alfa injection °What is this medicine? °EPOETIN ALFA (e POE e tin AL fa) helps your body make more red blood cells. This medicine is used to treat anemia caused by chronic kidney failure, cancer chemotherapy, or HIV-therapy. It may also be used before surgery if you have anemia. °This medicine may be used for other purposes; ask your health care provider or pharmacist if you have questions. °COMMON BRAND NAME(S): Epogen, Procrit, Retacrit °What should I tell my health care provider before I take this medicine? °They need to know if you have any of these conditions: °-blood clotting disorders °-cancer patient not on chemotherapy °-cystic fibrosis °-heart disease, such as angina or heart failure °-hemoglobin level of 12 g/dL or greater °-high blood pressure °-low levels of folate, iron, or vitamin B12 °-seizures °-an unusual or allergic reaction to erythropoietin, albumin, benzyl alcohol, hamster proteins, other medicines, foods, dyes, or preservatives °-pregnant or trying to get pregnant °-breast-feeding °How should I use this medicine? °This medicine is for injection into a vein or under the skin. It is usually given by a health care professional in a hospital or clinic setting. °If you get this medicine at home, you will be taught how to prepare and give this medicine. Use exactly as directed. Take your medicine at regular intervals. Do not take your medicine more often than directed. °It is important that you put your used needles and syringes in a special sharps container. Do not put them in a trash can. If you do not have a sharps container, call your pharmacist or healthcare provider to get one. °A special MedGuide will be given to you by the pharmacist with each prescription and refill. Be sure to read this information carefully each time. °Talk to your pediatrician regarding the use of this medicine in children. While this drug may be prescribed for selected conditions, precautions do  apply. °Overdosage: If you think you have taken too much of this medicine contact a poison control center or emergency room at once. °NOTE: This medicine is only for you. Do not share this medicine with others. °What if I miss a dose? °If you miss a dose, take it as soon as you can. If it is almost time for your next dose, take only that dose. Do not take double or extra doses. °What may interact with this medicine? °Do not take this medicine with any of the following medications: °-darbepoetin alfa °This list may not describe all possible interactions. Give your health care provider a list of all the medicines, herbs, non-prescription drugs, or dietary supplements you use. Also tell them if you smoke, drink alcohol, or use illegal drugs. Some items may interact with your medicine. °What should I watch for while using this medicine? °Your condition will be monitored carefully while you are receiving this medicine. °You may need blood work done while you are taking this medicine. °What side effects may I notice from receiving this medicine? °Side effects that you should report to your doctor or health care professional as soon as possible: °-allergic reactions like skin rash, itching or hives, swelling of the face, lips, or tongue °-breathing problems °-changes in vision °-chest pain °-confusion, trouble speaking or understanding °-feeling faint or lightheaded, falls °-high blood pressure °-muscle aches or pains °-pain, swelling, warmth in the leg °-rapid weight gain °-severe headaches °-sudden numbness or weakness of the face, arm or leg °-trouble walking, dizziness, loss of balance or coordination °-seizures (convulsions) °-swelling of the ankles, feet, hands °-unusually weak or   tired °Side effects that usually do not require medical attention (report to your doctor or health care professional if they continue or are bothersome): °-diarrhea °-fever, chills (flu-like symptoms) °-headaches °-nausea,  vomiting °-redness, stinging, or swelling at site where injected °This list may not describe all possible side effects. Call your doctor for medical advice about side effects. You may report side effects to FDA at 1-800-FDA-1088. °Where should I keep my medicine? °Keep out of the reach of children. °Store in a refrigerator between 2 and 8 degrees C (36 and 46 degrees F). Do not freeze or shake. Throw away any unused portion if using a single-dose vial. Multi-dose vials can be kept in the refrigerator for up to 21 days after the initial dose. Throw away unused medicine. °NOTE: This sheet is a summary. It may not cover all possible information. If you have questions about this medicine, talk to your doctor, pharmacist, or health care provider. °© 2018 Elsevier/Gold Standard (2015-12-03 19:42:31) ° °

## 2017-12-25 NOTE — Progress Notes (Signed)
Transferred prescriptions from [facility] pharmacy to [facility] per patient request.

## 2018-01-08 ENCOUNTER — Ambulatory Visit (HOSPITAL_COMMUNITY)
Admission: RE | Admit: 2018-01-08 | Discharge: 2018-01-08 | Disposition: A | Discharge status: Home / Self Care | Payer: Medicare Other | Source: Ambulatory Visit | Attending: Nephrology | Admitting: Nephrology

## 2018-01-08 DIAGNOSIS — N185 Chronic kidney disease, stage 5: Secondary | ICD-10-CM | POA: Insufficient documentation

## 2018-01-08 DIAGNOSIS — D631 Anemia in chronic kidney disease: Secondary | ICD-10-CM | POA: Insufficient documentation

## 2018-01-08 LAB — POCT HEMOGLOBIN-HEMACUE: Hemoglobin: 8.9 g/dL — ABNORMAL LOW (ref 12.0–15.0)

## 2018-01-08 MED ORDER — EPOETIN ALFA-EPBX 10000 UNIT/ML IJ SOLN
10000.0000 [IU] | Freq: Once | INTRAMUSCULAR | Status: AC
  Administered 2018-01-08: 10000 [IU] via SUBCUTANEOUS
  Filled 2018-01-08: qty 1

## 2018-01-14 DIAGNOSIS — N185 Chronic kidney disease, stage 5: Secondary | ICD-10-CM | POA: Diagnosis not present

## 2018-01-14 DIAGNOSIS — N2581 Secondary hyperparathyroidism of renal origin: Secondary | ICD-10-CM | POA: Diagnosis not present

## 2018-01-14 DIAGNOSIS — I770 Arteriovenous fistula, acquired: Secondary | ICD-10-CM | POA: Diagnosis not present

## 2018-01-14 DIAGNOSIS — D631 Anemia in chronic kidney disease: Secondary | ICD-10-CM | POA: Diagnosis not present

## 2018-01-14 DIAGNOSIS — I120 Hypertensive chronic kidney disease with stage 5 chronic kidney disease or end stage renal disease: Secondary | ICD-10-CM | POA: Diagnosis not present

## 2018-01-22 ENCOUNTER — Encounter (HOSPITAL_COMMUNITY)
Admission: RE | Admit: 2018-01-22 | Discharge: 2018-01-22 | Disposition: A | Discharge status: Home / Self Care | Payer: Medicare Other | Source: Ambulatory Visit | Attending: Nephrology | Admitting: Nephrology

## 2018-01-22 VITALS — BP 165/57 | HR 68 | Temp 98.2°F | Resp 20

## 2018-01-22 DIAGNOSIS — Z79899 Other long term (current) drug therapy: Secondary | ICD-10-CM | POA: Diagnosis not present

## 2018-01-22 DIAGNOSIS — D631 Anemia in chronic kidney disease: Secondary | ICD-10-CM | POA: Insufficient documentation

## 2018-01-22 DIAGNOSIS — Z5181 Encounter for therapeutic drug level monitoring: Secondary | ICD-10-CM | POA: Insufficient documentation

## 2018-01-22 DIAGNOSIS — N185 Chronic kidney disease, stage 5: Secondary | ICD-10-CM | POA: Insufficient documentation

## 2018-01-22 LAB — POCT HEMOGLOBIN-HEMACUE: HEMOGLOBIN: 8.6 g/dL — AB (ref 12.0–15.0)

## 2018-01-22 MED ORDER — EPOETIN ALFA-EPBX 10000 UNIT/ML IJ SOLN
10000.0000 [IU] | INTRAMUSCULAR | Status: DC
  Administered 2018-01-22: 10000 [IU] via SUBCUTANEOUS
  Filled 2018-01-22: qty 1

## 2018-01-28 DIAGNOSIS — I129 Hypertensive chronic kidney disease with stage 1 through stage 4 chronic kidney disease, or unspecified chronic kidney disease: Secondary | ICD-10-CM | POA: Diagnosis not present

## 2018-02-05 ENCOUNTER — Encounter (HOSPITAL_COMMUNITY)
Admission: RE | Admit: 2018-02-05 | Discharge: 2018-02-05 | Disposition: A | Discharge status: Home / Self Care | Payer: Medicare Other | Source: Ambulatory Visit | Attending: Nephrology | Admitting: Nephrology

## 2018-02-05 VITALS — BP 175/60 | HR 69 | Temp 98.7°F | Resp 20

## 2018-02-05 DIAGNOSIS — Z79899 Other long term (current) drug therapy: Secondary | ICD-10-CM | POA: Insufficient documentation

## 2018-02-05 DIAGNOSIS — Z5181 Encounter for therapeutic drug level monitoring: Secondary | ICD-10-CM | POA: Insufficient documentation

## 2018-02-05 DIAGNOSIS — N185 Chronic kidney disease, stage 5: Secondary | ICD-10-CM | POA: Diagnosis not present

## 2018-02-05 DIAGNOSIS — D631 Anemia in chronic kidney disease: Secondary | ICD-10-CM | POA: Insufficient documentation

## 2018-02-05 MED ORDER — EPOETIN ALFA-EPBX 10000 UNIT/ML IJ SOLN
10000.0000 [IU] | INTRAMUSCULAR | Status: DC
  Administered 2018-02-05: 10000 [IU] via SUBCUTANEOUS
  Filled 2018-02-05: qty 1

## 2018-02-08 LAB — POCT HEMOGLOBIN-HEMACUE: HEMOGLOBIN: 9.2 g/dL — AB (ref 12.0–15.0)

## 2018-02-16 DIAGNOSIS — N2581 Secondary hyperparathyroidism of renal origin: Secondary | ICD-10-CM | POA: Diagnosis not present

## 2018-02-16 DIAGNOSIS — I120 Hypertensive chronic kidney disease with stage 5 chronic kidney disease or end stage renal disease: Secondary | ICD-10-CM | POA: Diagnosis not present

## 2018-02-16 DIAGNOSIS — D631 Anemia in chronic kidney disease: Secondary | ICD-10-CM | POA: Diagnosis not present

## 2018-02-16 DIAGNOSIS — N185 Chronic kidney disease, stage 5: Secondary | ICD-10-CM | POA: Diagnosis not present

## 2018-02-16 DIAGNOSIS — I770 Arteriovenous fistula, acquired: Secondary | ICD-10-CM | POA: Diagnosis not present

## 2018-02-16 DIAGNOSIS — N189 Chronic kidney disease, unspecified: Secondary | ICD-10-CM | POA: Diagnosis not present

## 2018-02-19 ENCOUNTER — Encounter (HOSPITAL_COMMUNITY): Payer: Medicare Other

## 2018-02-21 ENCOUNTER — Emergency Department (HOSPITAL_COMMUNITY)
Admission: EM | Admit: 2018-02-21 | Discharge: 2018-02-21 | Disposition: A | Discharge status: Home / Self Care | Payer: Medicare Other | Attending: Emergency Medicine | Admitting: Emergency Medicine

## 2018-02-21 ENCOUNTER — Other Ambulatory Visit: Payer: Self-pay

## 2018-02-21 DIAGNOSIS — Z79899 Other long term (current) drug therapy: Secondary | ICD-10-CM | POA: Insufficient documentation

## 2018-02-21 DIAGNOSIS — N186 End stage renal disease: Secondary | ICD-10-CM | POA: Insufficient documentation

## 2018-02-21 DIAGNOSIS — I2510 Atherosclerotic heart disease of native coronary artery without angina pectoris: Secondary | ICD-10-CM | POA: Diagnosis not present

## 2018-02-21 DIAGNOSIS — F17210 Nicotine dependence, cigarettes, uncomplicated: Secondary | ICD-10-CM | POA: Diagnosis not present

## 2018-02-21 DIAGNOSIS — R109 Unspecified abdominal pain: Secondary | ICD-10-CM | POA: Insufficient documentation

## 2018-02-21 DIAGNOSIS — Z992 Dependence on renal dialysis: Secondary | ICD-10-CM | POA: Insufficient documentation

## 2018-02-21 DIAGNOSIS — Z8673 Personal history of transient ischemic attack (TIA), and cerebral infarction without residual deficits: Secondary | ICD-10-CM | POA: Diagnosis not present

## 2018-02-21 DIAGNOSIS — E785 Hyperlipidemia, unspecified: Secondary | ICD-10-CM | POA: Diagnosis not present

## 2018-02-21 DIAGNOSIS — I120 Hypertensive chronic kidney disease with stage 5 chronic kidney disease or end stage renal disease: Secondary | ICD-10-CM | POA: Insufficient documentation

## 2018-02-21 LAB — COMPREHENSIVE METABOLIC PANEL
ALT: 11 U/L (ref 0–44)
AST: 10 U/L — AB (ref 15–41)
Albumin: 3.5 g/dL (ref 3.5–5.0)
Alkaline Phosphatase: 68 U/L (ref 38–126)
Anion gap: 10 (ref 5–15)
BUN: 60 mg/dL — ABNORMAL HIGH (ref 8–23)
CHLORIDE: 117 mmol/L — AB (ref 98–111)
CO2: 16 mmol/L — AB (ref 22–32)
Calcium: 8.4 mg/dL — ABNORMAL LOW (ref 8.9–10.3)
Creatinine, Ser: 6.17 mg/dL — ABNORMAL HIGH (ref 0.44–1.00)
GFR calc Af Amer: 7 mL/min — ABNORMAL LOW (ref >60)
GFR, EST NON AFRICAN AMERICAN: 6 mL/min — AB (ref >60)
Glucose, Bld: 74 mg/dL (ref 70–99)
POTASSIUM: 4 mmol/L (ref 3.5–5.1)
SODIUM: 143 mmol/L (ref 135–145)
Total Bilirubin: 0.4 mg/dL (ref 0.3–1.2)
Total Protein: 7.2 g/dL (ref 6.5–8.1)

## 2018-02-21 LAB — URINALYSIS, ROUTINE W REFLEX MICROSCOPIC
Bilirubin Urine: NEGATIVE
GLUCOSE, UA: 50 mg/dL — AB
KETONES UR: NEGATIVE mg/dL
LEUKOCYTES UA: NEGATIVE
Nitrite: NEGATIVE
Specific Gravity, Urine: 1.012 (ref 1.005–1.030)
pH: 5 (ref 5.0–8.0)

## 2018-02-21 LAB — CBC WITH DIFFERENTIAL/PLATELET
ABS IMMATURE GRANULOCYTES: 0.02 10*3/uL (ref 0.00–0.07)
BASOS ABS: 0.1 10*3/uL (ref 0.0–0.1)
Basophils Relative: 1 %
Eosinophils Absolute: 0.1 10*3/uL (ref 0.0–0.5)
Eosinophils Relative: 1 %
HCT: 36.3 % (ref 36.0–46.0)
HEMOGLOBIN: 10.8 g/dL — AB (ref 12.0–15.0)
IMMATURE GRANULOCYTES: 0 %
LYMPHS ABS: 1.4 10*3/uL (ref 0.7–4.0)
LYMPHS PCT: 20 %
MCH: 29.5 pg (ref 26.0–34.0)
MCHC: 29.8 g/dL — AB (ref 30.0–36.0)
MCV: 99.2 fL (ref 80.0–100.0)
Monocytes Absolute: 0.5 10*3/uL (ref 0.1–1.0)
Monocytes Relative: 7 %
NEUTROS ABS: 5 10*3/uL (ref 1.7–7.7)
NRBC: 0 % (ref 0.0–0.2)
Neutrophils Relative %: 71 %
PLATELETS: 227 10*3/uL (ref 150–400)
RBC: 3.66 MIL/uL — AB (ref 3.87–5.11)
RDW: 15 % (ref 11.5–15.5)
WBC: 7.1 10*3/uL (ref 4.0–10.5)

## 2018-02-21 MED ORDER — AMLODIPINE BESYLATE 5 MG PO TABS
10.0000 mg | ORAL_TABLET | Freq: Every day | ORAL | Status: DC
  Administered 2018-02-21: 10 mg via ORAL
  Filled 2018-02-21: qty 2

## 2018-02-21 MED ORDER — HYDROCODONE-ACETAMINOPHEN 5-325 MG PO TABS: 1.0000 | ORAL_TABLET | ORAL | 0 refills | Status: DC | PRN

## 2018-02-21 MED ORDER — CARVEDILOL 25 MG PO TABS
25.0000 mg | ORAL_TABLET | Freq: Two times a day (BID) | ORAL | Status: DC
  Administered 2018-02-21: 25 mg via ORAL
  Filled 2018-02-21: qty 1

## 2018-02-21 MED ORDER — MORPHINE SULFATE (PF) 4 MG/ML IV SOLN
4.0000 mg | Freq: Once | INTRAVENOUS | Status: AC
  Administered 2018-02-21: 4 mg via INTRAVENOUS
  Filled 2018-02-21: qty 1

## 2018-02-21 MED ORDER — ONDANSETRON HCL 4 MG/2ML IJ SOLN
4.0000 mg | Freq: Once | INTRAMUSCULAR | Status: AC
  Administered 2018-02-21: 4 mg via INTRAVENOUS
  Filled 2018-02-21: qty 2

## 2018-02-21 NOTE — ED Notes (Signed)
Pt is unable to give a urine sample at this time due to just having went prior to order

## 2018-02-21 NOTE — ED Triage Notes (Addendum)
Left flank pain starting this morning, No urinary symptoms, pt is to start dialysis on Tuesday, not sure if related to kidney. HTN has not taken bp meds today

## 2018-02-21 NOTE — Discharge Instructions (Addendum)
Go to dialysis as scheduled on Tuesday, October 29.

## 2018-02-21 NOTE — ED Provider Notes (Signed)
[facility] DEPT Provider Note   CSN: [id number] Arrival date & time: 02/21/18  1134     History   Chief Complaint Chief Complaint  Patient presents with  . Flank Pain    Rt    HPI Stephanie Gross is a 72 y.o. female.  Pt presents to the ED today with left sided flank pain.  Pt said pain started this morning.  She denies dysuria.  She said pain is worse with movement.  She has not seen a rash.  She is due to start dialysis on Tuesday, 10/29.  She is unsure if pain is from her "kidneys."  Pt has not taken her meds today, and bp is elevated in triage.     Past Medical History:  Diagnosis Date  . Arthritis   . AVD (aortic valve disease)   . CAD (coronary artery disease)    s/p RCA stent 2007. Myoview 2010 normal  . Chronic obstructive bronchitis without exacerbation ([place])   . 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 Degeneration of meniscus of knee   . Derangement of left knee   . GERD (gastroesophageal reflux disease)   . HTN (hypertension)   . Hyperlipidemia   . Hypertension   . Obesity   . Vitamin D deficiency     Patient Active Problem List   Diagnosis Date Noted  . History of diabetes mellitus 08/17/2017  . Hypokalemia 08/03/2017  . Nausea and vomiting   . Gastritis and gastroduodenitis   . Malnutrition of moderate degree 07/22/2017  . Acute renal failure (ARF) ([place]) 07/21/2017  . Noncompliance with medications 06/03/2016  . CKD (chronic kidney disease) stage 5, GFR less than 15 ml/min ([facility]) 06/27/2015  . Tobacco abuse   . Preventative health care 03/13/2011  . Hyperlipidemia associated with type 2 diabetes mellitus ([place]) 03/12/2006  . Uncontrolled hypertension 03/12/2006  . CORONARY ARTERY DISEASE 03/12/2006    Past Surgical History:  Procedure Laterality Date  . ABDOMINAL HYSTERECTOMY    . AV FISTULA PLACEMENT Left 11/12/2017   Procedure: BRACHIOCEPHALIC ARTERIOVENOUS  (AV) FISTULA CREATION LEFT UPPER EXTREMITY;  Surgeon: Angelia Mould, MD;  Location: [facility];  Service: Vascular;  Laterality: Left;  . CHOLECYSTECTOMY    . CORONARY ANGIOPLASTY WITH STENT PLACEMENT    . ESOPHAGOGASTRODUODENOSCOPY N/A 07/23/2017   Procedure: ESOPHAGOGASTRODUODENOSCOPY (EGD);  Surgeon: Milus Banister, MD;  Location: [facility];  Service: Endoscopy;  Laterality: N/A;  . KNEE SURGERY    . SHOULDER SURGERY       OB History   None      Home Medications    Prior to Admission medications   Medication Sig Start Date End Date Taking? Authorizing Provider  amLODipine (NORVASC) 10 MG tablet Take 10 mg by mouth daily.   Yes [provider]  aspirin EC 81 MG tablet Take 1 tablet (81 mg total) by mouth daily. 06/02/16  Yes Velna Ochs, MD  calcitRIOL (ROCALTROL) 0.25 MCG capsule Take 1 capsule (0.25 mcg total) by mouth daily. 12/25/17  Yes Forde Dandy, PharmD  calcium acetate (PHOSLO) 667 MG capsule Take 667 mg by mouth daily. 09/25/17  Yes [provider]  carvedilol (COREG) 25 MG tablet Take 25 mg by mouth 2 (two) times daily with a meal.   Yes [provider]  cholecalciferol (VITAMIN D) 1000 units tablet Take 1 tablet (1,000 Units total) by mouth daily. 07/26/17  Yes Shelly Coss, MD  cloNIDine (  CATAPRES) 0.1 MG tablet Take 1 tablet (0.1 mg total) by mouth 2 (two) times daily. 12/25/17  Yes Forde Dandy, PharmD  Ferrous Sulfate (IRON) 325 (65 Fe) MG TABS Take 325 mg by mouth daily. 09/23/17  Yes [provider]  furosemide (LASIX) 40 MG tablet Take 40 mg by mouth daily.  12/08/17  Yes [provider]  sodium bicarbonate 650 MG tablet Take 2 tablets (1,300 mg total) by mouth daily. 12/25/17  Yes Forde Dandy, PharmD  spironolactone (ALDACTONE) 25 MG tablet Take 1 tablet (25 mg total) by mouth daily. 12/25/17  Yes Forde Dandy, PharmD  amLODipine (NORVASC) 10 MG tablet Take 1 tablet (10 mg total) by mouth daily.  12/25/17 01/24/18  Forde Dandy, PharmD  carvedilol (COREG) 25 MG tablet Take 1 tablet (25 mg total) by mouth 2 (two) times daily. 12/25/17 01/24/18  Forde Dandy, PharmD  furosemide (LASIX) 20 MG tablet Take 1 tablet (20 mg total) by mouth daily. Patient not taking: Reported on 02/21/2018 12/25/17 12/25/18  Forde Dandy, PharmD  HYDROcodone-acetaminophen (NORCO/VICODIN) 5-325 MG tablet Take 1 tablet by mouth every 4 (four) hours as needed. 02/21/18   Isla Pence, MD  oxyCODONE (ROXICODONE) 5 MG immediate release tablet Take 1 tablet (5 mg total) by mouth every 4 (four) hours as needed for severe pain. Patient not taking: Reported on 02/21/2018 11/12/17   Angelia Mould, MD    Family History Family History  Problem Relation Age of Onset  . Stroke Mother   . Diabetes Mother   . Hypertension Mother   . Diabetes Maternal Grandmother   . Diabetes Sister     Social History Social History   Tobacco Use  . Smoking status: Current Some Day Smoker    Packs/day: 0.25    Years: 30.00    Pack years: 7.50    Types: Cigarettes  . Smokeless tobacco: Never Used  . Tobacco comment: Doesn't smoke all of it. "Lights up and throws it away"  Substance Use Topics  . Alcohol use: No    Alcohol/week: 0.0 standard drinks  . Drug use: No     Allergies   Benazepril-hydrochlorothiazide; Chantix [varenicline]; Codeine; Methylprednisolone; and Statins   Review of Systems Review of Systems  Genitourinary: Positive for flank pain.  All other systems reviewed and are negative.    Physical Exam Updated Vital Signs BP (!) 231/86 (BP Location: Right Arm) Comment: x2. Pt did not take BP meds today.  Pulse 83   Temp 98.4 F (36.9 C) (Oral)   Resp 18   Ht 5' 5.5" (1.664 m)   Wt 58.1 kg   SpO2 99%   BMI 20.98 kg/m   Physical Exam  Constitutional: She is oriented to person, place, and time. She appears well-developed and well-nourished.  HENT:  Head: Normocephalic and atraumatic.   Right Ear: External ear normal.  Left Ear: External ear normal.  Nose: Nose normal.  Mouth/Throat: Oropharynx is clear and moist.  Eyes: Pupils are equal, round, and reactive to light. Conjunctivae and EOM are normal.  Neck: Normal range of motion. Neck supple.  Cardiovascular: Normal rate, regular rhythm, normal heart sounds and intact distal pulses.  Pulmonary/Chest: Effort normal and breath sounds normal.  Abdominal: Soft. Bowel sounds are normal.  Musculoskeletal: Normal range of motion.       Arms: Neurological: She is alert and oriented to person, place, and time.  Skin: Skin is warm. Capillary refill takes less than 2 seconds.  Psychiatric:  She has a normal mood and affect. Her behavior is normal. Judgment and thought content normal.  Nursing note and vitals reviewed.    ED Treatments / Results  Labs (all labs ordered are listed, but only abnormal results are displayed) Labs Reviewed  COMPREHENSIVE METABOLIC PANEL - Abnormal; Notable for the following components:      Result Value   Chloride 117 (*)    CO2 16 (*)    BUN 60 (*)    Creatinine, Ser 6.17 (*)    Calcium 8.4 (*)    AST 10 (*)    GFR calc non Af Amer 6 (*)    GFR calc Af Amer 7 (*)    All other components within normal limits  CBC WITH DIFFERENTIAL/PLATELET - Abnormal; Notable for the following components:   RBC 3.66 (*)    Hemoglobin 10.8 (*)    MCHC 29.8 (*)    All other components within normal limits  URINALYSIS, ROUTINE W REFLEX MICROSCOPIC    EKG None  Radiology No results found.  Procedures Procedures (including critical care time)  Medications Ordered in ED Medications  amLODipine (NORVASC) tablet 10 mg (10 mg Oral Given 02/21/18 1356)  carvedilol (COREG) tablet 25 mg (25 mg Oral Given 02/21/18 1329)  morphine 4 MG/ML injection 4 mg (4 mg Intravenous Given 02/21/18 1330)  ondansetron (ZOFRAN) injection 4 mg (4 mg Intravenous Given 02/21/18 1330)     Initial Impression / Assessment  and Plan / ED Course  I have reviewed the triage vital signs and the nursing notes.  Pertinent labs & imaging results that were available during my care of the patient were reviewed by me and considered in my medical decision making (see chart for details).    The nurse told the pt when she arrived that she needed a urine sample.  Pt told her that she did not have to go, then got up to use the restroom and did not give a sample.  The pt finally gave [place] a urine, but does not want to wait for the results.  The pt said she has been here too long and she wants to go.  Pt encouraged to f/u with pcp to get results.  She is also encouraged to f/u with dialysis on Tuesday the 29th as scheduled.    Final Clinical Impressions(s) / ED Diagnoses   Final diagnoses:  Left flank pain  ESRD needing dialysis [facility])    ED Discharge Orders         Ordered    HYDROcodone-acetaminophen (NORCO/VICODIN) 5-325 MG tablet  Every 4 hours PRN     02/21/18 1530           Isla Pence, MD 02/21/18 1535

## 2018-02-23 DIAGNOSIS — N2581 Secondary hyperparathyroidism of renal origin: Secondary | ICD-10-CM | POA: Diagnosis not present

## 2018-02-23 DIAGNOSIS — Z23 Encounter for immunization: Secondary | ICD-10-CM | POA: Diagnosis not present

## 2018-02-23 DIAGNOSIS — N186 End stage renal disease: Secondary | ICD-10-CM | POA: Diagnosis not present

## 2018-02-24 LAB — URINE CULTURE: CULTURE: NO GROWTH

## 2018-02-25 DIAGNOSIS — Z23 Encounter for immunization: Secondary | ICD-10-CM | POA: Diagnosis not present

## 2018-02-25 DIAGNOSIS — N2581 Secondary hyperparathyroidism of renal origin: Secondary | ICD-10-CM | POA: Diagnosis not present

## 2018-02-25 DIAGNOSIS — N186 End stage renal disease: Secondary | ICD-10-CM | POA: Diagnosis not present

## 2018-02-26 DIAGNOSIS — N186 End stage renal disease: Secondary | ICD-10-CM | POA: Diagnosis not present

## 2018-02-26 DIAGNOSIS — Z992 Dependence on renal dialysis: Secondary | ICD-10-CM | POA: Diagnosis not present

## 2018-02-26 DIAGNOSIS — I129 Hypertensive chronic kidney disease with stage 1 through stage 4 chronic kidney disease, or unspecified chronic kidney disease: Secondary | ICD-10-CM | POA: Diagnosis not present

## 2018-03-02 DIAGNOSIS — N2581 Secondary hyperparathyroidism of renal origin: Secondary | ICD-10-CM | POA: Diagnosis not present

## 2018-03-02 DIAGNOSIS — N186 End stage renal disease: Secondary | ICD-10-CM | POA: Diagnosis not present

## 2018-03-02 DIAGNOSIS — D688 Other specified coagulation defects: Secondary | ICD-10-CM | POA: Diagnosis not present

## 2018-03-02 DIAGNOSIS — D631 Anemia in chronic kidney disease: Secondary | ICD-10-CM | POA: Diagnosis not present

## 2018-03-04 DIAGNOSIS — D631 Anemia in chronic kidney disease: Secondary | ICD-10-CM | POA: Diagnosis not present

## 2018-03-04 DIAGNOSIS — N2581 Secondary hyperparathyroidism of renal origin: Secondary | ICD-10-CM | POA: Diagnosis not present

## 2018-03-04 DIAGNOSIS — N186 End stage renal disease: Secondary | ICD-10-CM | POA: Diagnosis not present

## 2018-03-04 DIAGNOSIS — D688 Other specified coagulation defects: Secondary | ICD-10-CM | POA: Diagnosis not present

## 2018-03-06 DIAGNOSIS — N186 End stage renal disease: Secondary | ICD-10-CM | POA: Diagnosis not present

## 2018-03-06 DIAGNOSIS — D688 Other specified coagulation defects: Secondary | ICD-10-CM | POA: Diagnosis not present

## 2018-03-06 DIAGNOSIS — D631 Anemia in chronic kidney disease: Secondary | ICD-10-CM | POA: Diagnosis not present

## 2018-03-06 DIAGNOSIS — N2581 Secondary hyperparathyroidism of renal origin: Secondary | ICD-10-CM | POA: Diagnosis not present

## 2018-03-10 DIAGNOSIS — D688 Other specified coagulation defects: Secondary | ICD-10-CM | POA: Diagnosis not present

## 2018-03-10 DIAGNOSIS — N2581 Secondary hyperparathyroidism of renal origin: Secondary | ICD-10-CM | POA: Diagnosis not present

## 2018-03-10 DIAGNOSIS — N186 End stage renal disease: Secondary | ICD-10-CM | POA: Diagnosis not present

## 2018-03-10 DIAGNOSIS — D631 Anemia in chronic kidney disease: Secondary | ICD-10-CM | POA: Diagnosis not present

## 2018-03-12 DIAGNOSIS — N186 End stage renal disease: Secondary | ICD-10-CM | POA: Diagnosis not present

## 2018-03-12 DIAGNOSIS — N2581 Secondary hyperparathyroidism of renal origin: Secondary | ICD-10-CM | POA: Diagnosis not present

## 2018-03-12 DIAGNOSIS — D631 Anemia in chronic kidney disease: Secondary | ICD-10-CM | POA: Diagnosis not present

## 2018-03-12 DIAGNOSIS — D688 Other specified coagulation defects: Secondary | ICD-10-CM | POA: Diagnosis not present

## 2018-03-13 DIAGNOSIS — N2581 Secondary hyperparathyroidism of renal origin: Secondary | ICD-10-CM | POA: Diagnosis not present

## 2018-03-13 DIAGNOSIS — D688 Other specified coagulation defects: Secondary | ICD-10-CM | POA: Diagnosis not present

## 2018-03-13 DIAGNOSIS — D631 Anemia in chronic kidney disease: Secondary | ICD-10-CM | POA: Diagnosis not present

## 2018-03-13 DIAGNOSIS — N186 End stage renal disease: Secondary | ICD-10-CM | POA: Diagnosis not present

## 2018-03-16 DIAGNOSIS — N186 End stage renal disease: Secondary | ICD-10-CM | POA: Diagnosis not present

## 2018-03-16 DIAGNOSIS — N2581 Secondary hyperparathyroidism of renal origin: Secondary | ICD-10-CM | POA: Diagnosis not present

## 2018-03-16 DIAGNOSIS — D631 Anemia in chronic kidney disease: Secondary | ICD-10-CM | POA: Diagnosis not present

## 2018-03-16 DIAGNOSIS — D688 Other specified coagulation defects: Secondary | ICD-10-CM | POA: Diagnosis not present

## 2018-03-18 DIAGNOSIS — D688 Other specified coagulation defects: Secondary | ICD-10-CM | POA: Diagnosis not present

## 2018-03-18 DIAGNOSIS — N2581 Secondary hyperparathyroidism of renal origin: Secondary | ICD-10-CM | POA: Diagnosis not present

## 2018-03-18 DIAGNOSIS — N186 End stage renal disease: Secondary | ICD-10-CM | POA: Diagnosis not present

## 2018-03-18 DIAGNOSIS — D631 Anemia in chronic kidney disease: Secondary | ICD-10-CM | POA: Diagnosis not present

## 2018-03-22 ENCOUNTER — Inpatient Hospital Stay (HOSPITAL_COMMUNITY): Payer: Medicare Other

## 2018-03-22 ENCOUNTER — Inpatient Hospital Stay (HOSPITAL_COMMUNITY)
Admission: EM | Admit: 2018-03-22 | Discharge: 2018-03-27 | DRG: 252 | Disposition: A | Discharge status: Home / Self Care | Payer: Medicare Other | Attending: Internal Medicine | Admitting: Internal Medicine

## 2018-03-22 ENCOUNTER — Emergency Department (HOSPITAL_COMMUNITY): Payer: Medicare Other

## 2018-03-22 ENCOUNTER — Other Ambulatory Visit: Payer: Self-pay

## 2018-03-22 DIAGNOSIS — Z992 Dependence on renal dialysis: Secondary | ICD-10-CM | POA: Diagnosis not present

## 2018-03-22 DIAGNOSIS — Z90710 Acquired absence of both cervix and uterus: Secondary | ICD-10-CM

## 2018-03-22 DIAGNOSIS — I359 Nonrheumatic aortic valve disorder, unspecified: Secondary | ICD-10-CM | POA: Diagnosis present

## 2018-03-22 DIAGNOSIS — E1169 Type 2 diabetes mellitus with other specified complication: Secondary | ICD-10-CM | POA: Diagnosis present

## 2018-03-22 DIAGNOSIS — Z955 Presence of coronary angioplasty implant and graft: Secondary | ICD-10-CM | POA: Diagnosis not present

## 2018-03-22 DIAGNOSIS — E872 Acidosis: Secondary | ICD-10-CM | POA: Diagnosis not present

## 2018-03-22 DIAGNOSIS — R000 Tachycardia, unspecified: Secondary | ICD-10-CM | POA: Diagnosis not present

## 2018-03-22 DIAGNOSIS — D539 Nutritional anemia, unspecified: Secondary | ICD-10-CM | POA: Diagnosis not present

## 2018-03-22 DIAGNOSIS — Z79899 Other long term (current) drug therapy: Secondary | ICD-10-CM

## 2018-03-22 DIAGNOSIS — I10 Essential (primary) hypertension: Secondary | ICD-10-CM

## 2018-03-22 DIAGNOSIS — F17210 Nicotine dependence, cigarettes, uncomplicated: Secondary | ICD-10-CM | POA: Diagnosis present

## 2018-03-22 DIAGNOSIS — R231 Pallor: Secondary | ICD-10-CM | POA: Diagnosis not present

## 2018-03-22 DIAGNOSIS — T82858A Stenosis of vascular prosthetic devices, implants and grafts, initial encounter: Secondary | ICD-10-CM | POA: Diagnosis present

## 2018-03-22 DIAGNOSIS — T82594A Other mechanical complication of infusion catheter, initial encounter: Secondary | ICD-10-CM

## 2018-03-22 DIAGNOSIS — Z95828 Presence of other vascular implants and grafts: Secondary | ICD-10-CM

## 2018-03-22 DIAGNOSIS — Z9289 Personal history of other medical treatment: Secondary | ICD-10-CM

## 2018-03-22 DIAGNOSIS — E785 Hyperlipidemia, unspecified: Secondary | ICD-10-CM | POA: Diagnosis present

## 2018-03-22 DIAGNOSIS — R0689 Other abnormalities of breathing: Secondary | ICD-10-CM | POA: Diagnosis not present

## 2018-03-22 DIAGNOSIS — R404 Transient alteration of awareness: Secondary | ICD-10-CM | POA: Diagnosis not present

## 2018-03-22 DIAGNOSIS — J9601 Acute respiratory failure with hypoxia: Secondary | ICD-10-CM | POA: Diagnosis not present

## 2018-03-22 DIAGNOSIS — R059 Cough, unspecified: Secondary | ICD-10-CM

## 2018-03-22 DIAGNOSIS — Z4682 Encounter for fitting and adjustment of non-vascular catheter: Secondary | ICD-10-CM | POA: Diagnosis not present

## 2018-03-22 DIAGNOSIS — T827XXA Infection and inflammatory reaction due to other cardiac and vascular devices, implants and grafts, initial encounter: Secondary | ICD-10-CM | POA: Diagnosis not present

## 2018-03-22 DIAGNOSIS — I2510 Atherosclerotic heart disease of native coronary artery without angina pectoris: Secondary | ICD-10-CM | POA: Diagnosis not present

## 2018-03-22 DIAGNOSIS — N186 End stage renal disease: Secondary | ICD-10-CM | POA: Diagnosis not present

## 2018-03-22 DIAGNOSIS — Z8673 Personal history of transient ischemic attack (TIA), and cerebral infarction without residual deficits: Secondary | ICD-10-CM | POA: Diagnosis not present

## 2018-03-22 DIAGNOSIS — J90 Pleural effusion, not elsewhere classified: Secondary | ICD-10-CM | POA: Diagnosis not present

## 2018-03-22 DIAGNOSIS — Z452 Encounter for adjustment and management of vascular access device: Secondary | ICD-10-CM

## 2018-03-22 DIAGNOSIS — J939 Pneumothorax, unspecified: Secondary | ICD-10-CM

## 2018-03-22 DIAGNOSIS — R918 Other nonspecific abnormal finding of lung field: Secondary | ICD-10-CM | POA: Diagnosis not present

## 2018-03-22 DIAGNOSIS — G9340 Encephalopathy, unspecified: Secondary | ICD-10-CM | POA: Diagnosis not present

## 2018-03-22 DIAGNOSIS — I120 Hypertensive chronic kidney disease with stage 5 chronic kidney disease or end stage renal disease: Secondary | ICD-10-CM | POA: Diagnosis not present

## 2018-03-22 DIAGNOSIS — Z01818 Encounter for other preprocedural examination: Secondary | ICD-10-CM

## 2018-03-22 DIAGNOSIS — E1122 Type 2 diabetes mellitus with diabetic chronic kidney disease: Secondary | ICD-10-CM | POA: Diagnosis not present

## 2018-03-22 DIAGNOSIS — I129 Hypertensive chronic kidney disease with stage 1 through stage 4 chronic kidney disease, or unspecified chronic kidney disease: Secondary | ICD-10-CM | POA: Diagnosis not present

## 2018-03-22 DIAGNOSIS — Z9049 Acquired absence of other specified parts of digestive tract: Secondary | ICD-10-CM

## 2018-03-22 DIAGNOSIS — Z833 Family history of diabetes mellitus: Secondary | ICD-10-CM

## 2018-03-22 DIAGNOSIS — D631 Anemia in chronic kidney disease: Secondary | ICD-10-CM | POA: Diagnosis present

## 2018-03-22 DIAGNOSIS — E875 Hyperkalemia: Secondary | ICD-10-CM | POA: Diagnosis not present

## 2018-03-22 DIAGNOSIS — J810 Acute pulmonary edema: Secondary | ICD-10-CM | POA: Insufficient documentation

## 2018-03-22 DIAGNOSIS — T82510A Breakdown (mechanical) of surgically created arteriovenous fistula, initial encounter: Secondary | ICD-10-CM | POA: Diagnosis not present

## 2018-03-22 DIAGNOSIS — D638 Anemia in other chronic diseases classified elsewhere: Secondary | ICD-10-CM | POA: Diagnosis not present

## 2018-03-22 DIAGNOSIS — E8770 Fluid overload, unspecified: Secondary | ICD-10-CM | POA: Diagnosis not present

## 2018-03-22 DIAGNOSIS — I160 Hypertensive urgency: Secondary | ICD-10-CM | POA: Diagnosis not present

## 2018-03-22 DIAGNOSIS — Z823 Family history of stroke: Secondary | ICD-10-CM

## 2018-03-22 DIAGNOSIS — R05 Cough: Secondary | ICD-10-CM

## 2018-03-22 DIAGNOSIS — N189 Chronic kidney disease, unspecified: Secondary | ICD-10-CM | POA: Diagnosis not present

## 2018-03-22 DIAGNOSIS — R778 Other specified abnormalities of plasma proteins: Secondary | ICD-10-CM

## 2018-03-22 DIAGNOSIS — Z7982 Long term (current) use of aspirin: Secondary | ICD-10-CM

## 2018-03-22 DIAGNOSIS — Y832 Surgical operation with anastomosis, bypass or graft as the cause of abnormal reaction of the patient, or of later complication, without mention of misadventure at the time of the procedure: Secondary | ICD-10-CM | POA: Diagnosis present

## 2018-03-22 DIAGNOSIS — Z885 Allergy status to narcotic agent status: Secondary | ICD-10-CM

## 2018-03-22 DIAGNOSIS — J811 Chronic pulmonary edema: Secondary | ICD-10-CM | POA: Diagnosis not present

## 2018-03-22 DIAGNOSIS — J9600 Acute respiratory failure, unspecified whether with hypoxia or hypercapnia: Secondary | ICD-10-CM | POA: Diagnosis not present

## 2018-03-22 DIAGNOSIS — J449 Chronic obstructive pulmonary disease, unspecified: Secondary | ICD-10-CM | POA: Diagnosis not present

## 2018-03-22 DIAGNOSIS — T82898A Other specified complication of vascular prosthetic devices, implants and grafts, initial encounter: Secondary | ICD-10-CM | POA: Diagnosis not present

## 2018-03-22 DIAGNOSIS — R7989 Other specified abnormal findings of blood chemistry: Secondary | ICD-10-CM

## 2018-03-22 DIAGNOSIS — Z978 Presence of other specified devices: Secondary | ICD-10-CM | POA: Diagnosis not present

## 2018-03-22 DIAGNOSIS — N185 Chronic kidney disease, stage 5: Secondary | ICD-10-CM | POA: Diagnosis not present

## 2018-03-22 DIAGNOSIS — Z79891 Long term (current) use of opiate analgesic: Secondary | ICD-10-CM

## 2018-03-22 DIAGNOSIS — Z8249 Family history of ischemic heart disease and other diseases of the circulatory system: Secondary | ICD-10-CM

## 2018-03-22 DIAGNOSIS — Z888 Allergy status to other drugs, medicaments and biological substances status: Secondary | ICD-10-CM

## 2018-03-22 DIAGNOSIS — R509 Fever, unspecified: Secondary | ICD-10-CM | POA: Diagnosis not present

## 2018-03-22 DIAGNOSIS — N2581 Secondary hyperparathyroidism of renal origin: Secondary | ICD-10-CM | POA: Diagnosis not present

## 2018-03-22 DIAGNOSIS — Z743 Need for continuous supervision: Secondary | ICD-10-CM | POA: Diagnosis not present

## 2018-03-22 LAB — POCT I-STAT 3, ART BLOOD GAS (G3+)
Acid-base deficit: 6 mmol/L — ABNORMAL HIGH (ref 0.0–2.0)
BICARBONATE: 19.1 mmol/L — AB (ref 20.0–28.0)
O2 Saturation: 100 %
TCO2: 20 mmol/L — AB (ref 22–32)
pCO2 arterial: 34.3 mmHg (ref 32.0–48.0)
pH, Arterial: 7.35 (ref 7.350–7.450)
pO2, Arterial: 227 mmHg — ABNORMAL HIGH (ref 83.0–108.0)

## 2018-03-22 LAB — URINALYSIS, ROUTINE W REFLEX MICROSCOPIC
BACTERIA UA: NONE SEEN
BILIRUBIN URINE: NEGATIVE
Glucose, UA: 150 mg/dL — AB
Hgb urine dipstick: NEGATIVE
Ketones, ur: NEGATIVE mg/dL
LEUKOCYTES UA: NEGATIVE
Nitrite: NEGATIVE
Specific Gravity, Urine: 1.013 (ref 1.005–1.030)
pH: 7 (ref 5.0–8.0)

## 2018-03-22 LAB — PROCALCITONIN: Procalcitonin: 0.43 ng/mL

## 2018-03-22 LAB — I-STAT ARTERIAL BLOOD GAS, ED
Acid-base deficit: 9 mmol/L — ABNORMAL HIGH (ref 0.0–2.0)
BICARBONATE: 17.6 mmol/L — AB (ref 20.0–28.0)
O2 SAT: 98 %
PCO2 ART: 41.6 mmHg (ref 32.0–48.0)
PO2 ART: 123 mmHg — AB (ref 83.0–108.0)
Patient temperature: 98.2
TCO2: 19 mmol/L — AB (ref 22–32)
pH, Arterial: 7.233 — ABNORMAL LOW (ref 7.350–7.450)

## 2018-03-22 LAB — I-STAT TROPONIN, ED
TROPONIN I, POC: 0.41 ng/mL — AB (ref 0.00–0.08)
Troponin i, poc: 0.07 ng/mL (ref 0.00–0.08)

## 2018-03-22 LAB — CBC
HEMATOCRIT: 37.7 % (ref 36.0–46.0)
HEMOGLOBIN: 10.1 g/dL — AB (ref 12.0–15.0)
MCH: 28 pg (ref 26.0–34.0)
MCHC: 26.8 g/dL — ABNORMAL LOW (ref 30.0–36.0)
MCV: 104.4 fL — AB (ref 80.0–100.0)
NRBC: 0.2 % (ref 0.0–0.2)
Platelets: 325 10*3/uL (ref 150–400)
RBC: 3.61 MIL/uL — AB (ref 3.87–5.11)
RDW: 14.6 % (ref 11.5–15.5)
WBC: 16.8 10*3/uL — ABNORMAL HIGH (ref 4.0–10.5)

## 2018-03-22 LAB — GLUCOSE, CAPILLARY
Glucose-Capillary: 140 mg/dL — ABNORMAL HIGH (ref 70–99)
Glucose-Capillary: 83 mg/dL (ref 70–99)
Glucose-Capillary: 77 mg/dL (ref 70–99)
Glucose-Capillary: 83 mg/dL (ref 70–99)

## 2018-03-22 LAB — TROPONIN I: Troponin I: 0.34 ng/mL (ref <0.03)

## 2018-03-22 LAB — BASIC METABOLIC PANEL
Anion gap: 10 (ref 5–15)
BUN: 72 mg/dL — ABNORMAL HIGH (ref 8–23)
CHLORIDE: 111 mmol/L (ref 98–111)
CO2: 18 mmol/L — AB (ref 22–32)
Calcium: 8.4 mg/dL — ABNORMAL LOW (ref 8.9–10.3)
Creatinine, Ser: 6.15 mg/dL — ABNORMAL HIGH (ref 0.44–1.00)
GFR calc Af Amer: 7 mL/min — ABNORMAL LOW (ref >60)
GFR calc non Af Amer: 6 mL/min — ABNORMAL LOW (ref >60)
GLUCOSE: 246 mg/dL — AB (ref 70–99)
Potassium: 5.3 mmol/L — ABNORMAL HIGH (ref 3.5–5.1)
SODIUM: 139 mmol/L (ref 135–145)

## 2018-03-22 LAB — I-STAT CHEM 8, ED
BUN: 81 mg/dL — AB (ref 8–23)
CREATININE: 6.2 mg/dL — AB (ref 0.44–1.00)
Calcium, Ion: 1.15 mmol/L (ref 1.15–1.40)
Chloride: 114 mmol/L — ABNORMAL HIGH (ref 98–111)
GLUCOSE: 217 mg/dL — AB (ref 70–99)
HEMATOCRIT: 33 % — AB (ref 36.0–46.0)
Hemoglobin: 11.2 g/dL — ABNORMAL LOW (ref 12.0–15.0)
Potassium: 4.8 mmol/L (ref 3.5–5.1)
Sodium: 140 mmol/L (ref 135–145)
TCO2: 18 mmol/L — AB (ref 22–32)

## 2018-03-22 LAB — MRSA PCR SCREENING: MRSA by PCR: NEGATIVE

## 2018-03-22 LAB — CBG MONITORING, ED: Glucose-Capillary: 195 mg/dL — ABNORMAL HIGH (ref 70–99)

## 2018-03-22 LAB — HEMOGLOBIN A1C
Hgb A1c MFr Bld: 4.9 % (ref 4.8–5.6)
Mean Plasma Glucose: 93.93 mg/dL

## 2018-03-22 MED ORDER — HEPARIN SODIUM (PORCINE) 5000 UNIT/ML IJ SOLN
5000.0000 [IU] | Freq: Three times a day (TID) | INTRAMUSCULAR | Status: DC
  Administered 2018-03-22: 5000 [IU] via SUBCUTANEOUS
  Filled 2018-03-22 (×7): qty 1

## 2018-03-22 MED ORDER — HEPARIN SODIUM (PORCINE) 1000 UNIT/ML DIALYSIS
2000.0000 [IU] | INTRAMUSCULAR | Status: DC | PRN
  Administered 2018-03-23: 2000 [IU] via INTRAVENOUS_CENTRAL

## 2018-03-22 MED ORDER — ETOMIDATE 2 MG/ML IV SOLN
INTRAVENOUS | Status: AC | PRN
  Administered 2018-03-22: 20 mg via INTRAVENOUS

## 2018-03-22 MED ORDER — NITROGLYCERIN IN D5W 200-5 MCG/ML-% IV SOLN
0.0000 ug/min | INTRAVENOUS | Status: DC
  Administered 2018-03-22: 10 ug/min via INTRAVENOUS

## 2018-03-22 MED ORDER — SODIUM CHLORIDE 0.9 % IV SOLN: 250.0000 mL | INTRAVENOUS | Status: DC | PRN

## 2018-03-22 MED ORDER — CHLORHEXIDINE GLUCONATE CLOTH 2 % EX PADS
6.0000 | MEDICATED_PAD | Freq: Every day | CUTANEOUS | Status: DC
  Administered 2018-03-22 – 2018-03-23 (×2): 6 via TOPICAL

## 2018-03-22 MED ORDER — PANTOPRAZOLE SODIUM 40 MG IV SOLR
40.0000 mg | Freq: Every day | INTRAVENOUS | Status: DC
  Administered 2018-03-22 – 2018-03-23 (×2): 40 mg via INTRAVENOUS
  Filled 2018-03-22 (×2): qty 40

## 2018-03-22 MED ORDER — SODIUM CHLORIDE 0.9% FLUSH: 3.0000 mL | INTRAVENOUS | Status: DC | PRN

## 2018-03-22 MED ORDER — CHLORHEXIDINE GLUCONATE 0.12% ORAL RINSE (MEDLINE KIT)
15.0000 mL | Freq: Two times a day (BID) | OROMUCOSAL | Status: DC
  Administered 2018-03-22 (×2): 15 mL via OROMUCOSAL

## 2018-03-22 MED ORDER — HYDRALAZINE HCL 20 MG/ML IJ SOLN
10.0000 mg | INTRAMUSCULAR | Status: DC | PRN
  Administered 2018-03-23 (×2): 20 mg via INTRAVENOUS
  Filled 2018-03-22 (×2): qty 1

## 2018-03-22 MED ORDER — ORAL CARE MOUTH RINSE
15.0000 mL | OROMUCOSAL | Status: DC
  Administered 2018-03-22 – 2018-03-23 (×8): 15 mL via OROMUCOSAL

## 2018-03-22 MED ORDER — CHLORHEXIDINE GLUCONATE CLOTH 2 % EX PADS
6.0000 | MEDICATED_PAD | Freq: Every day | CUTANEOUS | Status: DC
  Administered 2018-03-22: 6 via TOPICAL

## 2018-03-22 MED ORDER — HEPARIN SODIUM (PORCINE) 1000 UNIT/ML DIALYSIS
2000.0000 [IU] | INTRAMUSCULAR | Status: DC | PRN
  Filled 2018-03-22: qty 2

## 2018-03-22 MED ORDER — SUCCINYLCHOLINE CHLORIDE 20 MG/ML IJ SOLN
INTRAMUSCULAR | Status: AC | PRN
  Administered 2018-03-22: 100 mg via INTRAVENOUS

## 2018-03-22 MED ORDER — SODIUM CHLORIDE 0.9% FLUSH
3.0000 mL | Freq: Two times a day (BID) | INTRAVENOUS | Status: DC
  Administered 2018-03-22 – 2018-03-23 (×3): 3 mL via INTRAVENOUS

## 2018-03-22 MED ORDER — PROPOFOL 1000 MG/100ML IV EMUL
INTRAVENOUS | Status: AC
  Filled 2018-03-22: qty 100

## 2018-03-22 MED ORDER — STERILE WATER FOR INJECTION IV SOLN
INTRAVENOUS | Status: DC
  Administered 2018-03-22: 09:00:00 via INTRAVENOUS
  Filled 2018-03-22: qty 850

## 2018-03-22 MED ORDER — PROPOFOL 1000 MG/100ML IV EMUL
5.0000 ug/kg/min | INTRAVENOUS | Status: DC
  Filled 2018-03-22: qty 100

## 2018-03-22 MED ORDER — NITROGLYCERIN IN D5W 200-5 MCG/ML-% IV SOLN
INTRAVENOUS | Status: AC
  Filled 2018-03-22: qty 250

## 2018-03-22 MED ORDER — INSULIN ASPART 100 UNIT/ML ~~LOC~~ SOLN
0.0000 [IU] | SUBCUTANEOUS | Status: DC
  Administered 2018-03-22: 1 [IU] via SUBCUTANEOUS

## 2018-03-22 MED ORDER — PROPOFOL 1000 MG/100ML IV EMUL
5.0000 ug/kg/min | INTRAVENOUS | Status: DC
  Administered 2018-03-22: 10 ug/kg/min via INTRAVENOUS

## 2018-03-22 NOTE — Progress Notes (Signed)
RT assisted ED MD Roxanne Mins with intubation. Intubation uneventful. Glide used with a Mac 3 blade and a 7.5 ETT with CP of 28, commercial tube holder in place, pt inline suctioned with minimal secretions. Pt tolerating current vent settings of PRVC 450/15/100/+5. RT will continue to monitor pt.

## 2018-03-22 NOTE — Progress Notes (Signed)
CXR post TLC with concern for PTX.  Clinically stable.  Will order another CXR at 9 PM and re-evaluate.  Rush Farmer, M.D. [facility] Pulmonary/Critical Care Medicine. Pager: [phone]. After hours pager: [phone].

## 2018-03-22 NOTE — Progress Notes (Signed)
NAME:  Stephanie Gross, MRN:  [id number], DOB:  [birth year], LOS: 0 ADMISSION DATE:  03/22/2018, CONSULTATION DATE:  03/22/2018 REFERRING MD:  Dr. Roxanne Mins EDP, CHIEF COMPLAINT:  SOB  Brief History   72 year old female with ESRD on HD intubated in ED for hypoxia/pulm edema after being unable to receive HD due to graft issues.   History of present illness   72 year old female with past medical history as below, which is significant for end-stage renal disease on hemodialysis, COPD, CVA, and hypertension.  She had left upper extremity fistula placed back in July of this year and it matured as expected.  It appears as though she began using this in late October and initially had no complications, however, now in late November there have been some issues accessing her graft.  Her son states that on more than one occasion the dialysis nurses have been unable to access it and have asked her to come back the following day.  He also reports there has been some edema in the area of the fistula.  Her last dialysis treatment was 11/21, which was without complication.  She then would have been scheduled for dialysis on 11/23, however, the staff at the HD center was unable to access her fistula.  Then in the early a.m. hours of 11/25 she woke up complaining of shortness of breath and heat flashes.  Son also reports coughing, which she is" clear whether or not this is productive.  He denies that she complained of fever/chills, nausea/vomiting, and chest pain.  EMS was called, but by the time of their arrival she was minimally responsive and required bag mask ventilation in route to the emergency department where she was intubated for hypoxia and airway protection.  Imaging concerning for pulmonary edema.  PCCM asked to admit.  Significant Hospital Events   11/25 admit  Consults:  Nephrology 11/25 >  Procedures:  ETT 11/25 >  Significant Diagnostic Tests:  N/A  Micro Data:  Blood 11/25 > Tracheal  aspirate 11/25 >  Antimicrobials:    Interim history/subjective:  No events since admission, no new complaints  Objective   Blood pressure 93/61, pulse 68, temperature (!) 97.4 F (36.3 C), temperature source Oral, resp. rate 16, height 5\' 5"  (1.651 m), weight 58.5 kg, SpO2 100 %.    Vent Mode: PRVC FiO2 (%):  [60 %-100 %] 60 % Set Rate:  [15 bmp-16 bmp] 16 bmp Vt Set:  [450 mL] 450 mL PEEP:  [5 cmH20-8 cmH20] 8 cmH20 Plateau Pressure:  [22 cmH20-26 cmH20] 22 cmH20   Intake/Output Summary (Last 24 hours) at 03/22/2018 1104 Last data filed at 03/22/2018 1000 Gross per 24 hour  Intake 180.44 ml  Output 125 ml  Net 55.44 ml   Filed Weights   03/22/18 0429  Weight: 58.5 kg    Examination: General: Elderly female, sedate but arousable and following commands HENT: [place]/AT, PERRL, OEM-I and MMM Lungs: Coarse crackles diffusely Cardiovascular: RRR, Nl S1/S2 and -M/R/G Abdomen: Soft, NT, ND and +BS Extremities: -edema and -tenderness Neuro: Sedated RASS -2.   Resolved Hospital Problem list     Assessment & Plan:   Acute hypoxemic respiratory failure: in the setting of pulmonary edema/hypervolemia 24 hours after not receiving HD on her scheduled day. Cannot rule out pneumonia, but based on her history this seems much less likely.  - Begin PS trials but no extubation until after HD - Decrease PEEP to 5 and FiO2 to 40% - Adjust vent for  ABG - Will consult nephrology for HD/volume removal.  - VAP bundle - No need for abx for now - CXR and ABG in AM  ESRD on HD:  Typically Tues, Thurs, Sat schedule. There have been issues accessing her graft. Positive bruit and thrill on my assessment in ED.  Last Tx 11/21. Did not receive HD 11/13 due to graft issue. - Nephrology consult called, hopefully HD today - May need vascular to evaluate graft once she is more stable but will defer that for now - BMET - Adjust vent for ABG  Hypertensive crisis: BP better controlled now on propofol  infusion - SBP goal 150 - 170 mmHg for first 6 hours (until 0900), then < 160 mmHg - Will add PRN hydralazine - Plan to resume home amlodipine, carvedilol, clonidine after extubation, she is stable for now - Volume removal  SIRS without clear infectious source.  - Send blood/sputum cultures - Hold of on ABX - Trend WBC, fever curve.   NAG Acidosis - slow bicarb infusion  Anemia hemoglobin seems to be at about her baseline. - Follow CBC - Subcutaneous heparin for VTE ppx.   Hyperkalemia - HD pending - Follow up BMP  Family updated bedside and all questions answered  Best practice:  Diet: NPO Pain/Anxiety/Delirium protocol (if indicated): Propofol infusion to keep RASS -1 to -2.  VAP protocol (if indicated): Per protocol DVT prophylaxis: SQ heparin GI prophylaxis: Protonix Glucose control: SSI Mobility: Bed rest Code Status: Full code Family Communication: Two sons updated in ED Disposition: ICU  Labs   CBC: Recent Labs  Lab 03/22/18 0358 03/22/18 0428  WBC  --  16.8*  HGB 11.2* 10.1*  HCT 33.0* 37.7  MCV  --  104.4*  PLT  --  220    Basic Metabolic Panel: Recent Labs  Lab 03/22/18 0358 03/22/18 0428  NA 140 139  K 4.8 5.3*  CL 114* 111  CO2  --  18*  GLUCOSE 217* 246*  BUN 81* 72*  CREATININE 6.20* 6.15*  CALCIUM  --  8.4*   GFR: Estimated Creatinine Clearance: 7.7 mL/min (A) (by C-G formula based on SCr of 6.15 mg/dL (H)). Recent Labs  Lab 03/22/18 0428 03/22/18 0622  PROCALCITON  --  0.43  WBC 16.8*  --     Liver Function Tests: No results for input(s): AST, ALT, ALKPHOS, BILITOT, PROT, ALBUMIN in the last 168 hours. No results for input(s): LIPASE, AMYLASE in the last 168 hours. No results for input(s): AMMONIA in the last 168 hours.  ABG    Component Value Date/Time   PHART 7.233 (L) 03/22/2018 0502   PCO2ART 41.6 03/22/2018 0502   PO2ART 123.0 (H) 03/22/2018 0502   HCO3 17.6 (L) 03/22/2018 0502   TCO2 19 (L) 03/22/2018 0502    ACIDBASEDEF 9.0 (H) 03/22/2018 0502   O2SAT 98.0 03/22/2018 0502     Coagulation Profile: No results for input(s): INR, PROTIME in the last 168 hours.  Cardiac Enzymes: Recent Labs  Lab 03/22/18 0622  TROPONINI 0.34*    HbA1C: Hgb A1c MFr Bld  Date/Time Value Ref Range Status  03/22/2018 04:28 AM 4.9 4.8 - 5.6 % Final    Comment:    (NOTE) Pre diabetes:          5.7%-6.4% Diabetes:              >6.4% Glycemic control for   <7.0% adults with diabetes   07/21/2017 02:16 PM 5.2 4.8 - 5.6 % Final  Comment:    (NOTE) Pre diabetes:          5.7%-6.4% Diabetes:              >6.4% Glycemic control for   <7.0% adults with diabetes     CBG: Recent Labs  Lab 03/22/18 0348 03/22/18 0745  GLUCAP 195* 140*    Review of Systems:   Unable as patient is encephalopathic and intubated.   Past Medical History  She,  has a past medical history of Arthritis, AVD (aortic valve disease), CAD (coronary artery disease), Chronic obstructive bronchitis without exacerbation ([place]), CVA (cerebral infarction), Degeneration of meniscus of knee, Derangement of left knee, GERD (gastroesophageal reflux disease), HTN (hypertension), Hyperlipidemia, Hypertension, Obesity, and Vitamin D deficiency.   Surgical History    Past Surgical History:  Procedure Laterality Date  . ABDOMINAL HYSTERECTOMY    . AV FISTULA PLACEMENT Left 11/12/2017   Procedure: BRACHIOCEPHALIC ARTERIOVENOUS (AV) FISTULA CREATION LEFT UPPER EXTREMITY;  Surgeon: Angelia Mould, MD;  Location: [facility];  Service: Vascular;  Laterality: Left;  . CHOLECYSTECTOMY    . CORONARY ANGIOPLASTY WITH STENT PLACEMENT    . ESOPHAGOGASTRODUODENOSCOPY N/A 07/23/2017   Procedure: ESOPHAGOGASTRODUODENOSCOPY (EGD);  Surgeon: Milus Banister, MD;  Location: [facility];  Service: Endoscopy;  Laterality: N/A;  . KNEE SURGERY    . SHOULDER SURGERY       Social History   reports that she has been smoking cigarettes. She has a 7.50  pack-year smoking history. She has never used smokeless tobacco. She reports that she does not drink alcohol or use drugs.   Family History   Her family history includes Diabetes in her maternal grandmother, mother, and sister; Hypertension in her mother; Stroke in her mother.   Allergies Allergies  Allergen Reactions  . Benazepril-Hydrochlorothiazide Other (See Comments)    UNKNOWN. Ok with lisinopril  . Chantix [Varenicline] Other (See Comments)    SUICIDAL IDEATION  . Codeine Nausea And Vomiting  . Methylprednisolone Other (See Comments)    UNKNOWN  . Statins Other (See Comments)    "walking into a hole", heavy legs, feels like she is going to fall     Home Medications  Prior to Admission medications   Medication Sig Start Date End Date Taking? Authorizing Provider  amLODipine (NORVASC) 10 MG tablet Take 1 tablet (10 mg total) by mouth daily. 12/25/17 01/24/18  Forde Dandy, PharmD  amLODipine (NORVASC) 10 MG tablet Take 10 mg by mouth daily.    [provider]  aspirin EC 81 MG tablet Take 1 tablet (81 mg total) by mouth daily. 06/02/16   Velna Ochs, MD  calcitRIOL (ROCALTROL) 0.25 MCG capsule Take 1 capsule (0.25 mcg total) by mouth daily. 12/25/17   Forde Dandy, PharmD  calcium acetate (PHOSLO) 667 MG capsule Take 667 mg by mouth daily. 09/25/17   [provider]  carvedilol (COREG) 25 MG tablet Take 1 tablet (25 mg total) by mouth 2 (two) times daily. 12/25/17 01/24/18  Forde Dandy, PharmD  carvedilol (COREG) 25 MG tablet Take 25 mg by mouth 2 (two) times daily with a meal.    [provider]  cholecalciferol (VITAMIN D) 1000 units tablet Take 1 tablet (1,000 Units total) by mouth daily. 07/26/17   Shelly Coss, MD  cloNIDine (CATAPRES) 0.1 MG tablet Take 1 tablet (0.1 mg total) by mouth 2 (two) times daily. 12/25/17   Forde Dandy, PharmD  Ferrous Sulfate (IRON) 325 (65 Fe) MG TABS Take 325  mg by mouth daily. 09/23/17   [provider]  furosemide (LASIX) 20 MG tablet Take 1 tablet (20 mg total) by mouth daily. Patient not taking: Reported on 02/21/2018 12/25/17 12/25/18  Forde Dandy, PharmD  furosemide (LASIX) 40 MG tablet Take 40 mg by mouth daily.  12/08/17   [provider]  HYDROcodone-acetaminophen (NORCO/VICODIN) 5-325 MG tablet Take 1 tablet by mouth every 4 (four) hours as needed. 02/21/18   Isla Pence, MD  oxyCODONE (ROXICODONE) 5 MG immediate release tablet Take 1 tablet (5 mg total) by mouth every 4 (four) hours as needed for severe pain. Patient not taking: Reported on 02/21/2018 11/12/17   Angelia Mould, MD  sodium bicarbonate 650 MG tablet Take 2 tablets (1,300 mg total) by mouth daily. 12/25/17   Forde Dandy, PharmD  spironolactone (ALDACTONE) 25 MG tablet Take 1 tablet (25 mg total) by mouth daily. 12/25/17   Forde Dandy, PharmD    The patient is critically ill with multiple organ systems failure and requires high complexity decision making for assessment and support, frequent evaluation and titration of therapies, application of advanced monitoring technologies and extensive interpretation of multiple databases.   Critical Care Time devoted to patient care services described in this note is  50  Minutes. This time reflects time of care of this signee Dr Jennet Maduro. This critical care time does not reflect procedure time, or teaching time or supervisory time of PA/NP/Med student/Med Resident etc but could involve care discussion time.  Rush Farmer, M.D. [facility] Pulmonary/Critical Care Medicine. Pager: [phone]. After hours pager: [phone].

## 2018-03-22 NOTE — Progress Notes (Signed)
RT obtained ABG from pt. Pt ABG results as follows:    Ref. Range 03/22/2018 05:02  Sample type Unknown ARTERIAL  pH, Arterial Latest Ref Range: 7.350 - 7.450  7.233 (L)  pCO2 arterial Latest Ref Range: 32.0 - 48.0 mmHg 41.6  pO2, Arterial Latest Ref Range: 83.0 - 108.0 mmHg 123.0 (H)  TCO2 Latest Ref Range: 22 - 32 mmol/L 19 (L)  Acid-base deficit Latest Ref Range: 0.0 - 2.0 mmol/L 9.0 (H)  Bicarbonate Latest Ref Range: 20.0 - 28.0 mmol/L 17.6 (L)  O2 Saturation Latest Units: % 98.0  Patient temperature Unknown 98.2 F  Collection site Unknown RADIAL, ALLEN'S TEST ACCEPTABLE    RT will continue to monitor pt.

## 2018-03-22 NOTE — ED Provider Notes (Signed)
[facility] EMERGENCY DEPARTMENT Provider Note   CSN: [id number] Arrival date & time: 03/22/18  0341     History   Chief Complaint Chief Complaint  Patient presents with  . Respiratory Distress  . Altered Mental Status    HPI Stephanie Gross is a 72 y.o. female.  The history is provided by the EMS personnel and a relative. The history is limited by the condition of the patient (Unresponsive).  Altered Mental Status    She has history of hypertension, diabetes, hyperlipidemia, end-stage renal disease on hemodialysis, coronary artery disease and is brought in by ambulance in respiratory distress.  Son states that she had been fine during the day, complained of being hot tonight.  She then became very dyspneic and then collapsed.  EMS noted initial oxygen saturation of 66% and needed assistance of Ambu bag to ventilate her.  She was too lethargic to place on CPAP.  She did have a nasal trumpet inserted.  She missed her dialysis session 2 days ago because of some arm swelling and they were unable to get the needle in her fistula.  As far as the son is aware, she did not have any dietary indiscretions.  Past Medical History:  Diagnosis Date  . Arthritis   . AVD (aortic valve disease)   . CAD (coronary artery disease)    s/p RCA stent 2007. Myoview 2010 normal  . Chronic obstructive bronchitis without exacerbation ([place])   . 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 Degeneration of meniscus of knee   . Derangement of left knee   . GERD (gastroesophageal reflux disease)   . HTN (hypertension)   . Hyperlipidemia   . Hypertension   . Obesity   . Vitamin D deficiency     Patient Active Problem List   Diagnosis Date Noted  . History of diabetes mellitus 08/17/2017  . Hypokalemia 08/03/2017  . Nausea and vomiting   . Gastritis and gastroduodenitis   . Malnutrition of moderate degree 07/22/2017  . Acute renal  failure (ARF) ([place]) 07/21/2017  . Noncompliance with medications 06/03/2016  . CKD (chronic kidney disease) stage 5, GFR less than 15 ml/min (HCC) 06/27/2015  . Tobacco abuse   . Preventative health care 03/13/2011  . Hyperlipidemia associated with type 2 diabetes mellitus ([place]) 03/12/2006  . Uncontrolled hypertension 03/12/2006  . CORONARY ARTERY DISEASE 03/12/2006    Past Surgical History:  Procedure Laterality Date  . ABDOMINAL HYSTERECTOMY    . AV FISTULA PLACEMENT Left 11/12/2017   Procedure: BRACHIOCEPHALIC ARTERIOVENOUS (AV) FISTULA CREATION LEFT UPPER EXTREMITY;  Surgeon: Angelia Mould, MD;  Location: [facility];  Service: Vascular;  Laterality: Left;  . CHOLECYSTECTOMY    . CORONARY ANGIOPLASTY WITH STENT PLACEMENT    . ESOPHAGOGASTRODUODENOSCOPY N/A 07/23/2017   Procedure: ESOPHAGOGASTRODUODENOSCOPY (EGD);  Surgeon: Milus Banister, MD;  Location: [facility];  Service: Endoscopy;  Laterality: N/A;  . KNEE SURGERY    . SHOULDER SURGERY       OB History   None      Home Medications    Prior to Admission medications   Medication Sig Start Date End Date Taking? Authorizing Provider  amLODipine (NORVASC) 10 MG tablet Take 1 tablet (10 mg total) by mouth daily. 12/25/17 01/24/18  Forde Dandy, PharmD  amLODipine (NORVASC) 10 MG tablet Take 10 mg by mouth daily.    [provider]  aspirin EC 81 MG tablet Take 1  tablet (81 mg total) by mouth daily. 06/02/16   Velna Ochs, MD  calcitRIOL (ROCALTROL) 0.25 MCG capsule Take 1 capsule (0.25 mcg total) by mouth daily. 12/25/17   Forde Dandy, PharmD  calcium acetate (PHOSLO) 667 MG capsule Take 667 mg by mouth daily. 09/25/17   [provider]  carvedilol (COREG) 25 MG tablet Take 1 tablet (25 mg total) by mouth 2 (two) times daily. 12/25/17 01/24/18  Forde Dandy, PharmD  carvedilol (COREG) 25 MG tablet Take 25 mg by mouth 2 (two) times daily with a meal.    [provider]  cholecalciferol  (VITAMIN D) 1000 units tablet Take 1 tablet (1,000 Units total) by mouth daily. 07/26/17   Shelly Coss, MD  cloNIDine (CATAPRES) 0.1 MG tablet Take 1 tablet (0.1 mg total) by mouth 2 (two) times daily. 12/25/17   Forde Dandy, PharmD  Ferrous Sulfate (IRON) 325 (65 Fe) MG TABS Take 325 mg by mouth daily. 09/23/17   [provider]  furosemide (LASIX) 20 MG tablet Take 1 tablet (20 mg total) by mouth daily. Patient not taking: Reported on 02/21/2018 12/25/17 12/25/18  Forde Dandy, PharmD  furosemide (LASIX) 40 MG tablet Take 40 mg by mouth daily.  12/08/17   [provider]  HYDROcodone-acetaminophen (NORCO/VICODIN) 5-325 MG tablet Take 1 tablet by mouth every 4 (four) hours as needed. 02/21/18   Isla Pence, MD  oxyCODONE (ROXICODONE) 5 MG immediate release tablet Take 1 tablet (5 mg total) by mouth every 4 (four) hours as needed for severe pain. Patient not taking: Reported on 02/21/2018 11/12/17   Angelia Mould, MD  sodium bicarbonate 650 MG tablet Take 2 tablets (1,300 mg total) by mouth daily. 12/25/17   Forde Dandy, PharmD  spironolactone (ALDACTONE) 25 MG tablet Take 1 tablet (25 mg total) by mouth daily. 12/25/17   Forde Dandy, PharmD    Family History Family History  Problem Relation Age of Onset  . Stroke Mother   . Diabetes Mother   . Hypertension Mother   . Diabetes Maternal Grandmother   . Diabetes Sister     Social History Social History   Tobacco Use  . Smoking status: Current Some Day Smoker    Packs/day: 0.25    Years: 30.00    Pack years: 7.50    Types: Cigarettes  . Smokeless tobacco: Never Used  . Tobacco comment: Doesn't smoke all of it. "Lights up and throws it away"  Substance Use Topics  . Alcohol use: No    Alcohol/week: 0.0 standard drinks  . Drug use: No     Allergies   Benazepril-hydrochlorothiazide; Chantix [varenicline]; Codeine; Methylprednisolone; and Statins   Review of Systems Review of Systems    Unable to perform ROS: Patient unresponsive     Physical Exam Updated Vital Signs BP (!) 222/104   Pulse (!) 116   Temp 98.2 F (36.8 C) (Rectal)   Resp (!) 25   SpO2 98%   Physical Exam  Nursing note and vitals reviewed.  72 year old female in acute respiratory distress with nasal trumpet in place and respirations being assisted by bag valve mask. Vital signs are significant for rapid heart rate, rapid respiratory rate, severely elevated blood pressure. Oxygen saturation is 98%, which is normal. Head is normocephalic and atraumatic. PERRLA, EOMI. Oropharynx is clear. Neck is nontender and supple without adenopathy. JVD is present at 90 degrees.  Frothy secretions are coming from the mouth and nose. Lungs have diffuse  rales. Chest is nontender. Heart has regular rate and rhythm without murmur. Abdomen is soft, flat, nontender without masses or hepatosplenomegaly and peristalsis is normoactive. Extremities have no cyanosis or edema, full range of motion is present.  AV fistula present in the left upper arm with thrill present. Skin is warm and dry without rash. Neurologic: Severely obtunded, but does have purposeful movement, cranial nerves are intact, there are no gross motor or sensory deficits.  ED Treatments / Results  Labs (all labs ordered are listed, but only abnormal results are displayed) Labs Reviewed  BASIC METABOLIC PANEL - Abnormal; Notable for the following components:      Result Value   Potassium 5.3 (*)    CO2 18 (*)    Glucose, Bld 246 (*)    BUN 72 (*)    Creatinine, Ser 6.15 (*)    Calcium 8.4 (*)    GFR calc non Af Amer 6 (*)    GFR calc Af Amer 7 (*)    All other components within normal limits  CBC - Abnormal; Notable for the following components:   WBC 16.8 (*)    RBC 3.61 (*)    Hemoglobin 10.1 (*)    MCV 104.4 (*)    MCHC 26.8 (*)    All other components within normal limits  URINALYSIS, ROUTINE W REFLEX MICROSCOPIC - Abnormal; Notable for  the following components:   Glucose, UA 150 (*)    Protein, ur >=300 (*)    Non Squamous Epithelial 0-5 (*)    All other components within normal limits  CBG MONITORING, ED - Abnormal; Notable for the following components:   Glucose-Capillary 195 (*)    All other components within normal limits  I-STAT CHEM 8, ED - Abnormal; Notable for the following components:   Chloride 114 (*)    BUN 81 (*)    Creatinine, Ser 6.20 (*)    Glucose, Bld 217 (*)    TCO2 18 (*)    Hemoglobin 11.2 (*)    HCT 33.0 (*)    All other components within normal limits  I-STAT ARTERIAL BLOOD GAS, ED - Abnormal; Notable for the following components:   pH, Arterial 7.233 (*)    pO2, Arterial 123.0 (*)    Bicarbonate 17.6 (*)    TCO2 19 (*)    Acid-base deficit 9.0 (*)    All other components within normal limits  I-STAT TROPONIN, ED - Abnormal; Notable for the following components:   Troponin i, poc 0.41 (*)    All other components within normal limits  CULTURE, BLOOD (ROUTINE X 2)  CULTURE, BLOOD (ROUTINE X 2)  CULTURE, RESPIRATORY  PROCALCITONIN  BLOOD GAS, ARTERIAL  TROPONIN I  HEMOGLOBIN A1C  I-STAT TROPONIN, ED    EKG EKG Interpretation  Date/Time:  Monday March 22 2018 03:47:20 EST Ventricular Rate:  116 PR Interval:    QRS Duration: 86 QT Interval:  328 QTC Calculation: 456 R Axis:   7 Text Interpretation:  Sinus tachycardia Probable left atrial enlargement LVH with secondary repolarization abnormality Anterior infarct, old When compared with ECG of 08/31/2017, HEART RATE has increased Anterior Q waves, ST elevation in anterior leads with reciprocal ST depression in anterolateral leads - possible STEMI Confirmed by Delora Fuel ([id number]) on 03/22/2018 4:00:03 AM   EKG Interpretation  Date/Time:  Monday March 22 2018 04:47:12 EST Ventricular Rate:  92 PR Interval:    QRS Duration: 77 QT Interval:  381 QTC Calculation: 472 R Axis:  70 Text Interpretation:  Sinus rhythm Left  atrial enlargement Anterior infarct, old When compared with ECG of EARLIER SAME DATE There has been significant improvement in ST segment changes Confirmed by Delora Fuel ([id number]) on 03/22/2018 4:51:05 AM       Radiology Dg Chest Port 1 View  Result Date: 03/22/2018 CLINICAL DATA:  Endotracheal tube retracted. EXAM: PORTABLE CHEST 1 VIEW COMPARISON:  Earlier this day at 0358 hour FINDINGS: Endotracheal tube has been retracted with tip approximately 18 mm from the carina. Tip and side port of the enteric tube below the diaphragm not included in the field of view, likely in the stomach. Unchanged cardiomegaly. Unchanged diffuse bilateral perihilar opacities. Slight elevation of left hemidiaphragm, likely increasing atelectasis. Lucency about the lateral lower left hemithorax and upper abdomen felt to be external artifact, however recommend attention on follow-up to exclude pneumothorax. IMPRESSION: 1. Endotracheal tube has been retracted with tip now 1.8 cm from the carina. 2. Unchanged cardiomegaly. Unchanged diffuse bilateral perihilar opacities favoring pulmonary edema, multifocal infection or aspiration are again considered. 3. Slight elevation of left hemidiaphragm likely increasing atelectasis. Electronically Signed   By: Keith Rake M.D.   On: 03/22/2018 04:34   Dg Chest Portable 1 View  Result Date: 03/22/2018 CLINICAL DATA:  Post intubation and OG tube placement. EXAM: PORTABLE CHEST 1 VIEW COMPARISON:  Subsequent exam.  Chest CT 07/21/2017 FINDINGS: The endotracheal tube tip is in the right mainstem bronchus, this is been subsequently retracted. Enteric tube tip and side-port below the diaphragm, likely in the stomach. The heart is enlarged. Bilateral perihilar opacities suspicious for pulmonary edema. No large pleural effusion. No evidence of pneumothorax. No acute osseous abnormalities are seen. IMPRESSION: 1. Right mainstem intubation. This has been subsequently corrected. Enteric tube  tip and side-port below the diaphragm not included in the field of view but likely in the stomach. 2. Cardiomegaly. Diffuse bilateral perihilar opacities favor pulmonary edema, multifocal infection or aspiration are also considered. Electronically Signed   By: Keith Rake M.D.   On: 03/22/2018 04:31    Procedures Procedure Name: Intubation Date/Time: 03/22/2018 4:00 AM Performed by: Delora Fuel, MD Pre-anesthesia Checklist: Patient identified, Patient being monitored, Emergency Drugs available, Timeout performed and Suction available Oxygen Delivery Method: Non-rebreather mask Preoxygenation: Pre-oxygenation with 100% oxygen Induction Type: Rapid sequence Ventilation: Mask ventilation without difficulty Laryngoscope Size: 3, Glidescope and Mac Grade View: Grade I Tube size: 7.5 mm Number of attempts: 1 Airway Equipment and Method: Rigid stylet and Video-laryngoscopy Placement Confirmation: ETT inserted through vocal cords under direct vision,  CO2 detector and Breath sounds checked- equal and bilateral Secured at: 21 cm Tube secured with: ETT holder Dental Injury: Teeth and Oropharynx as per pre-operative assessment        CRITICAL CARE Performed by: Delora Fuel Total critical care time: 110 minutes Critical care time was exclusive of separately billable procedures and treating other patients. Critical care was necessary to treat or prevent imminent or life-threatening deterioration. Critical care was time spent personally by me on the following activities: development of treatment plan with patient and/or surrogate as well as nursing, discussions with consultants, evaluation of patient's response to treatment, examination of patient, obtaining history from patient or surrogate, ordering and performing treatments and interventions, ordering and review of laboratory studies, ordering and review of radiographic studies, pulse oximetry and re-evaluation of patient's  condition.  Medications Ordered in ED Medications  propofol (DIPRIVAN) 1000 MG/100ML infusion (10 mcg/kg/min Intravenous New Bag/Given 03/22/18 0356)  propofol (DIPRIVAN) 1000  MG/100ML infusion (has no administration in time range)  nitroGLYCERIN 50 mg in dextrose 5 % 250 mL (0.2 mg/mL) infusion (15 mcg/min Intravenous Rate/Dose Change 03/22/18 0418)  nitroGLYCERIN 0.2 mg/mL in dextrose 5 % infusion (has no administration in time range)  etomidate (AMIDATE) injection (20 mg Intravenous Given 03/22/18 0351)  succinylcholine (ANECTINE) injection (100 mg Intravenous Given 03/22/18 0352)     Initial Impression / Assessment and Plan / ED Course  I have reviewed the triage vital signs and the nursing notes.  Pertinent labs & imaging results that were available during my care of the patient were reviewed by me and considered in my medical decision making (see chart for details).  Acute pulmonary edema secondary to missing dialysis.  ECG is concerning for STEMI.  Case has been discussed with Dr. Neena Rhymes of cardiology service who has reviewed the ECG and he feels that this most likely represents cardiac strain and not STEMI.  He is also to review the ECG with the on-call STEMI physician, Dr. Angelena Form.  Case has been discussed with Dr. Melvia Heaps of nephrology service who is coming in to dialyze the patient.  Case has also discussed with Dr. Tamala Julian of critical care service who agrees to admit the patient.  Dr. Angelena Form has reviewed the ECG and does not feel this is a STEMI that should go directly to the catheterization lab.  Repeat ECG shows significant improvement in ST segment changes.  Repeat troponin has increased, but not dramatically.  This is still felt to be consistent with demand ischemia and not likely to be a true STEMI.  Blood pressure has been adequately controlled with intravenous nitroglycerin.  Awaiting dialysis.  Final Clinical Impressions(s) / ED Diagnoses   Final diagnoses:  Acute  pulmonary edema (HCC)  End-stage renal disease on hemodialysis ([place])  Elevated troponin I level  Macrocytic anemia    ED Discharge Orders    None       Delora Fuel, MD 54/98/26 [phone]

## 2018-03-22 NOTE — Progress Notes (Signed)
Cardiology [facility] Note  Contacted by ED physician with some concern for STEMI.  Patient with sudden collapse at home and found to have acute respiratory failure, hypertensive emergency, pulmonary edema.  Now intubated in the ED.  ECG with what appears to be J-point elevation in leads V2 and V3 with ST depression throughout the remainder of precordial and limb leads.  New Q waves in anteroseptal leads compared to last ECG from approximately 6 months ago.  Case discussed with on-call interventionalist Dr. Angelena Form, who is in agreement that ECG is not consistent with acute STEMI. First troponin noted to be negative. Will plan to bring blood pressure down, treat acute pulmonary edema, and recheck troponin and ECG in 30 to 60 minutes to assess for any evolution in ischemic changes.   Marcie Mowers, MD Cardiology Fellow, PGY-6

## 2018-03-22 NOTE — Progress Notes (Signed)
CRITICAL VALUE ALERT  Critical Value:  Troponin 0.34   Date & Time Notified: 11/25 @ 0840  Provider Notified: Dr. Nelda Marseille  Orders Received/Actions taken: No further orders at this time.

## 2018-03-22 NOTE — Progress Notes (Signed)
Initial Nutrition Assessment  DOCUMENTATION CODES:   Not applicable  INTERVENTION:   Add Rena-vit  Pt may benefit from double portions; snacks between meals  NUTRITION DIAGNOSIS:   Increased nutrient needs related to catabolic illness as evidenced by estimated needs.  GOAL:   Patient will meet greater than or equal to 90% of their needs  MONITOR:   PO intake, Supplement acceptance, Labs, Weight trends, Skin  REASON FOR ASSESSMENT:   Ventilator    ASSESSMENT:   72 yo female admitted on 11/25 with acute respiratory failure in setting of pulmonary edema/volume overload after not receiving HD due to issues with AVF. Pt required intubation but extubated on 11/26. PMH includes ESRD on HD starting 3 weeks ago, CVA, DM, HTN, HLD  11/25 HD with 3 L UF 11/26 Extubated 11/27 Fistulogram by vascular, HD today  Recorded po intake 75%. Pt reports she is starving on visit today. Pt received a late breakfast, eating peanut butter crackers. Pt waiting on lunch, pt also waiting to go to HD Per RN, if pt tolerates HD via fistula, pt likely home tomorrow.   Pt reports she eats well at home, pt does not take any oral nutrition supplements. Pt also reports she does not like them either  Pt denies weight loss; pt does not know her dry weight. Per weight encounters, no recent weight loss trend noted   EDW 53.5 kg per MD note, current wt 51.5 kg  Labs: phosphorus 6.7 (H) Meds: calcitriol, phoslo   Diet Order:   Diet Order            Diet renal with fluid restriction Fluid restriction: 1200 mL Fluid; Room service appropriate? Yes; Fluid consistency: Thin  Diet effective now              EDUCATION NEEDS:   Education needs have been addressed  Skin:  Skin Assessment: Reviewed RN Assessment  Last BM:  no documented BM  Height:   Ht Readings from Last 1 Encounters:  03/22/18 5\' 5"  (1.651 m)    Weight:   Wt Readings from Last 1 Encounters:  03/24/18 52 kg    BMI:   Body mass index is 19.08 kg/m.  Estimated Nutritional Needs:   Kcal:  1500-1800 kcals   Protein:  75-85 g  Fluid:  1000 mL plus UOP   [employer] MS, RD, LDN, CNSC [phone] Pager  [phone] Weekend/On-Call Pager

## 2018-03-22 NOTE — ED Notes (Signed)
Phlebotomy at bedside.

## 2018-03-22 NOTE — ED Notes (Signed)
Reject x1, unsuccessful venipuncture

## 2018-03-22 NOTE — Consult Note (Addendum)
Renal Service Consult Note [facility]  Stephanie Gross 03/22/2018 [place]azing Requesting Physician:  Dr Gilford Raid  Reason for Consult:  ESRD pt resp failure HPI: The patient is a 72 y.o. year-old w/ hx of tobacco use, HTN, , DM2 CVA and ESRD recent start to HD 3 weeks ago.  Pt was found at home in resp distress, brought to ED by EMS w/ O2 sat 60%, BP 240/120, intubated and started on IV NTG and propofol.  CXR shows severe bilat alveolar infiltrates.  Asked to see for ESRD.    Spoke w/ one of pt's sons who is in [place].  Per son pt got about 1/2 of HD last Thursday due to AVF issues, and did not get any HD this Sat also due to AVF issues but he doesn't have any further details.  Pt unable to give hx.    Per the son pt lives w/ another son in [place], she started HD 3-4 wks ago here in [place].  Hx of CVA but no chronic deficits.  Hx DM, HTN.  No recent acute illness, no hx DVT or cancer. No compliance issues.    ROS  n/a   Past Medical History  Past Medical History:  Diagnosis Date  . Arthritis   . AVD (aortic valve disease)   . CAD (coronary artery disease)    s/p RCA stent 2007. Myoview 2010 normal  . Chronic obstructive bronchitis without exacerbation ([place])   . 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 Degeneration of meniscus of knee   . Derangement of left knee   . GERD (gastroesophageal reflux disease)   . HTN (hypertension)   . Hyperlipidemia   . Hypertension   . Obesity   . Vitamin D deficiency    Past Surgical History  Past Surgical History:  Procedure Laterality Date  . ABDOMINAL HYSTERECTOMY    . AV FISTULA PLACEMENT Left 11/12/2017   Procedure: BRACHIOCEPHALIC ARTERIOVENOUS (AV) FISTULA CREATION LEFT UPPER EXTREMITY;  Surgeon: Angelia Mould, MD;  Location: [place];  Service: Vascular;  Laterality: Left;  . CHOLECYSTECTOMY    . CORONARY ANGIOPLASTY WITH STENT PLACEMENT    .  ESOPHAGOGASTRODUODENOSCOPY N/A 07/23/2017   Procedure: ESOPHAGOGASTRODUODENOSCOPY (EGD);  Surgeon: Milus Banister, MD;  Location: [facility];  Service: Endoscopy;  Laterality: N/A;  . KNEE SURGERY    . SHOULDER SURGERY     Family History  Family History  Problem Relation Age of Onset  . Stroke Mother   . Diabetes Mother   . Hypertension Mother   . Diabetes Maternal Grandmother   . Diabetes Sister    Social History  reports that she has been smoking cigarettes. She has a 7.50 pack-year smoking history. She has never used smokeless tobacco. She reports that she does not drink alcohol or use drugs. Allergies  Allergies  Allergen Reactions  . Benazepril-Hydrochlorothiazide Other (See Comments)    UNKNOWN. Ok with lisinopril  . Chantix [Varenicline] Other (See Comments)    SUICIDAL IDEATION  . Codeine Nausea And Vomiting  . Methylprednisolone Other (See Comments)    UNKNOWN  . Statins Other (See Comments)    "walking into a hole", heavy legs, feels like she is going to fall   Home medications Prior to Admission medications   Medication Sig Start Date End Date Taking? Authorizing Provider  amLODipine (NORVASC) 10 MG tablet Take 1 tablet (10 mg total) by mouth daily. 12/25/17 01/24/18  Flossie Dibble  J, PharmD  amLODipine (NORVASC) 10 MG tablet Take 10 mg by mouth daily.    [provider]  aspirin EC 81 MG tablet Take 1 tablet (81 mg total) by mouth daily. 06/02/16   Velna Ochs, MD  calcitRIOL (ROCALTROL) 0.25 MCG capsule Take 1 capsule (0.25 mcg total) by mouth daily. 12/25/17   Forde Dandy, PharmD  calcium acetate (PHOSLO) 667 MG capsule Take 667 mg by mouth daily. 09/25/17   [provider]  carvedilol (COREG) 25 MG tablet Take 1 tablet (25 mg total) by mouth 2 (two) times daily. 12/25/17 01/24/18  Forde Dandy, PharmD  carvedilol (COREG) 25 MG tablet Take 25 mg by mouth 2 (two) times daily with a meal.    [provider]  cholecalciferol (VITAMIN  D) 1000 units tablet Take 1 tablet (1,000 Units total) by mouth daily. 07/26/17   Shelly Coss, MD  cloNIDine (CATAPRES) 0.1 MG tablet Take 1 tablet (0.1 mg total) by mouth 2 (two) times daily. 12/25/17   Forde Dandy, PharmD  Ferrous Sulfate (IRON) 325 (65 Fe) MG TABS Take 325 mg by mouth daily. 09/23/17   [provider]  furosemide (LASIX) 20 MG tablet Take 1 tablet (20 mg total) by mouth daily. Patient not taking: Reported on 02/21/2018 12/25/17 12/25/18  Forde Dandy, PharmD  furosemide (LASIX) 40 MG tablet Take 40 mg by mouth daily.  12/08/17   [provider]  HYDROcodone-acetaminophen (NORCO/VICODIN) 5-325 MG tablet Take 1 tablet by mouth every 4 (four) hours as needed. 02/21/18   Isla Pence, MD  oxyCODONE (ROXICODONE) 5 MG immediate release tablet Take 1 tablet (5 mg total) by mouth every 4 (four) hours as needed for severe pain. Patient not taking: Reported on 02/21/2018 11/12/17   Angelia Mould, MD  sodium bicarbonate 650 MG tablet Take 2 tablets (1,300 mg total) by mouth daily. 12/25/17   Forde Dandy, PharmD  spironolactone (ALDACTONE) 25 MG tablet Take 1 tablet (25 mg total) by mouth daily. 12/25/17   Forde Dandy, PharmD   Liver Function Tests No results for input(s): AST, ALT, ALKPHOS, BILITOT, PROT, ALBUMIN in the last 168 hours. No results for input(s): LIPASE, AMYLASE in the last 168 hours. CBC Recent Labs  Lab 03/22/18 0358 03/22/18 0428  WBC  --  16.8*  HGB 11.2* 10.1*  HCT 33.0* 37.7  MCV  --  104.4*  PLT  --  258   Basic Metabolic Panel Recent Labs  Lab 03/22/18 0358 03/22/18 0428  NA 140 139  K 4.8 5.3*  CL 114* 111  CO2  --  18*  GLUCOSE 217* 246*  BUN 81* 72*  CREATININE 6.20* 6.15*  CALCIUM  --  8.4*   Iron/TIBC/Ferritin/ %Sat    Component Value Date/Time   IRON 31 07/23/2017 0554   TIBC 176 (L) 07/23/2017 0554   FERRITIN 43 07/23/2017 0554   IRONPCTSAT 18 07/23/2017 0554    Vitals:   03/22/18 0540  03/22/18 0545 03/22/18 0550 03/22/18 0555  BP: (!) 154/88 (!) 174/83 (!) 171/86 129/73  Pulse: 87 91 90 85  Resp: (!) 24 (!) 21 (!) 22 18  Temp: (!) 95.9 F (35.5 C) (!) 95.7 F (35.4 C) (!) 95.4 F (35.2 C) (!) 95.7 F (35.4 C)  TempSrc:      SpO2: 98% 95% 98% 97%  Weight:      Height:       Exam Gen on vent, sedated No rash, cyanosis or gangrene Sclera  anicteric, throat w ETT  +JVD to angle of jaw Chest coarse rales bilat RRR no MRG Abd soft ntnd no mass or ascites +bs GU defer MS no joint effusions or deformity Ext no LE or UE edema, no wounds or ulcers Neuro is alert, Ox 3 , nf L forearm AVF+good bruit, small bruising/ swelling area distal AVF    Home meds:  - amlodipine 10/ carvedilol 25 bid/ clonidine 0.1 bid/ furosemide 40 qd/ spironolactone 25 qd  - sod bicarbonate 1300 qd/ calcium acetate 1 ac  - aspirin 81  - oxycodone IR prn/ hydrocodone- acetaminophen qid prn   Dialysis: [facility]  4h  250/1.5  53.5kg  3K/2.5Ca  Hep 1600  - mircera 100 q 2, last 11.21, last Hb 8.4  - calc po 0.5 ug tiw, pth 1698   CXR - diffuse severe alveolar infiltrates bilat   Impression/ Plan: 1. Acute resp failure/ severe bilat pulm infiltrates - suspect pulm edema from missed HD/ vol overload.  Needs acute HD, hopefully AVF will work , if not will need temp cath.  Serial HD today and tomorrow, get vol down.   2. ESRD on HD , started 3 wks ago. L AVF.  3. HTN - uncontrolled, get vol down 4. Hx CVA - no deficits per family 5. Anemia ckd - Hb 10, no need esa, get records 6. MBD ckd - get records     Kelly Splinter MD [employer] pager [phone]   03/22/2018, 6:04 AM

## 2018-03-22 NOTE — ED Triage Notes (Signed)
Pt found at home by son in respiratory distress. Pt alert to name with EMS initially then became less responsive requiring assisted ventilations. Pt is on dialysis, went for treatment on Saturday but was unable to access her graft. Last treatment unknown, assumed to be Thursday. Rales throughout, initial sats 60s%, hypertensive 240/120.

## 2018-03-22 NOTE — Progress Notes (Signed)
Attempt to cannulate pts fistula failed. MD notified, pt scheduled for dialysis cath. HD rescheduled for later.

## 2018-03-22 NOTE — ED Notes (Signed)
Stephanie Gross, son updated on bed assignment

## 2018-03-22 NOTE — ED Notes (Signed)
Pt coughing and awake on the vent; propofol increased

## 2018-03-22 NOTE — H&P (Addendum)
NAME:  Stephanie Gross, MRN:  [id number], DOB:  11-May-1945, LOS: 0 ADMISSION DATE:  03/22/2018, CONSULTATION DATE:  03/22/2018 REFERRING MD:  Dr. Roxanne Mins EDP, CHIEF COMPLAINT:  SOB  Brief History   72 year old female with ESRD on HD intubated in ED for hypoxia/pulm edema after being unable to receive HD due to graft issues.   History of present illness   72 year old female with past medical history as below, which is significant for end-stage renal disease on hemodialysis, COPD, CVA, and hypertension.  She had left upper extremity fistula placed back in July of this year and it matured as expected.  It appears as though she began using this in late October and initially had no complications, however, now in late November there have been some issues accessing her graft.  Her son states that on more than one occasion the dialysis nurses have been unable to access it and have asked her to come back the following day.  He also reports there has been some edema in the area of the fistula.  Her last dialysis treatment was 11/21, which was without complication.  She then would have been scheduled for dialysis on 11/23, however, the staff at the HD center was unable to access her fistula.  Then in the early a.m. hours of 11/25 she woke up complaining of shortness of breath and heat flashes.  Son also reports coughing, which she is" clear whether or not this is productive.  He denies that she complained of fever/chills, nausea/vomiting, and chest pain.  EMS was called, but by the time of their arrival she was minimally responsive and required bag mask ventilation in route to the emergency department where she was intubated for hypoxia and airway protection.  Imaging concerning for pulmonary edema.  PCCM asked to admit.  Past Medical History   has a past medical history of Arthritis, AVD (aortic valve disease), CAD (coronary artery disease), Chronic obstructive bronchitis without exacerbation ([facility]), CVA  (cerebral infarction), Degeneration of meniscus of knee, Derangement of left knee, GERD (gastroesophageal reflux disease), HTN (hypertension), Hyperlipidemia, Hypertension, Obesity, and Vitamin D deficiency.  Significant Hospital Events   11/25 admit  Consults:  Nephrology 11/25 >  Procedures:  ETT 11/25 >  Significant Diagnostic Tests:    Micro Data:  Blood 11/25 > Tracheal aspirate 11/25 >  Antimicrobials:    Interim history/subjective:  Has been waking up somewhat, RN has had to go up on propofol.   Objective   Blood pressure (!) 176/91, pulse 93, temperature (!) 96.1 F (35.6 C), resp. rate (!) 30, height 5\' 5"  (1.651 m), weight 58.5 kg, SpO2 98 %.    Vent Mode: PRVC FiO2 (%):  [100 %] 100 % Set Rate:  [15 bmp] 15 bmp Vt Set:  [450 mL] 450 mL PEEP:  [5 cmH20] 5 cmH20 Plateau Pressure:  [26 cmH20] 26 cmH20  No intake or output data in the 24 hours ending 03/22/18 0529 Filed Weights   03/22/18 0429  Weight: 58.5 kg    Examination: General: Frail elderly female in NAD on vnet HENT: [place]/AT, PERRL Lungs: Coarse crackles [place] Cardiovascular: RRR, no MRG Abdomen: Soft, non-tender Extremities: No acute deformity. No edema Neuro: Sedated RASS -2.   Resolved Hospital Problem list     Assessment & Plan:   Acute hypoxemic respiratory failure: in the setting of pulmonary edema/hypervolemia 24 hours after not receiving HD on her scheduled day. Cannot rule out pneumonia, but based on her history this seems much  less likely.  - Admit to ICU - Full vent support - ABG reviewed. Reduced FiO2 to 60%  - Will consult nephrology for HD/volume removal.  - VAP bundle - Defer antibiotics for now, send cultures  ESRD on HD:  Typically Tues, Thurs, Sat schedule. There have been issues accessing her graft. Positive bruit and thrill on my assessment in ED.  Last Tx 11/21. Did not receive HD 11/13 due to graft issue. - Consult nephrology - May need vascular to evaluate graft  once she is more stable - Follow BMP  Hypertensive crisis: BP better controlled now on propofol infusion - SBP goal 150 - 170 mmHg for first 6 hours (until 0900), then < 160 mmHg - Will add PRN hydralazine - Plan to resume home amlodipine, carvedilol, clonidine, once she is out of the "first 6 hours" window.  - Volume removal  SIRS without clear infectious source.  - Send blood/sputum cultures - Hold of on ABX - Trend WBC, fever curve.   NAG Acidosis - slow bicarb infusion  Anemia hemoglobin seems to be at about her baseline. - Follow CBC - Subcutaneous heparin for VTE ppx.   Hyperkalemia - HD pending - Follow up BMP  Best practice:  Diet: NPO Pain/Anxiety/Delirium protocol (if indicated): Propofol infusion to keep RASS -1 to -2.  VAP protocol (if indicated): Per protocol DVT prophylaxis: SQ heparin GI prophylaxis: Protonix Glucose control: SSI Mobility: Bed rest Code Status: Full code Family Communication: Two sons updated in ED Disposition: ICU  Labs   CBC: Recent Labs  Lab 03/22/18 0358 03/22/18 0428  WBC  --  16.8*  HGB 11.2* 10.1*  HCT 33.0* 37.7  MCV  --  104.4*  PLT  --  379    Basic Metabolic Panel: Recent Labs  Lab 03/22/18 0358 03/22/18 0428  NA 140 139  K 4.8 5.3*  CL 114* 111  CO2  --  18*  GLUCOSE 217* 246*  BUN 81* 72*  CREATININE 6.20* 6.15*  CALCIUM  --  8.4*   GFR: Estimated Creatinine Clearance: 7.7 mL/min (A) (by C-G formula based on SCr of 6.15 mg/dL (H)). Recent Labs  Lab 03/22/18 0428  WBC 16.8*    Liver Function Tests: No results for input(s): AST, ALT, ALKPHOS, BILITOT, PROT, ALBUMIN in the last 168 hours. No results for input(s): LIPASE, AMYLASE in the last 168 hours. No results for input(s): AMMONIA in the last 168 hours.  ABG    Component Value Date/Time   PHART 7.233 (L) 03/22/2018 0502   PCO2ART 41.6 03/22/2018 0502   PO2ART 123.0 (H) 03/22/2018 0502   HCO3 17.6 (L) 03/22/2018 0502   TCO2 19 (L)  03/22/2018 0502   ACIDBASEDEF 9.0 (H) 03/22/2018 0502   O2SAT 98.0 03/22/2018 0502     Coagulation Profile: No results for input(s): INR, PROTIME in the last 168 hours.  Cardiac Enzymes: No results for input(s): CKTOTAL, CKMB, CKMBINDEX, TROPONINI in the last 168 hours.  HbA1C: Hemoglobin A1C  Date/Time Value Ref Range Status  06/02/2016 04:03 PM 6.3  Final  09/10/2015 04:34 PM 6.5  Final   Hgb A1c MFr Bld  Date/Time Value Ref Range Status  07/21/2017 02:16 PM 5.2 4.8 - 5.6 % Final    Comment:    (NOTE) Pre diabetes:          5.7%-6.4% Diabetes:              >6.4% Glycemic control for   <7.0% adults with diabetes   12/08/2014 06:53  PM 7.0 (H) 4.8 - 5.6 % Final    Comment:    (NOTE)         Pre-diabetes: 5.7 - 6.4         Diabetes: >6.4         Glycemic control for adults with diabetes: <7.0     CBG: Recent Labs  Lab 03/22/18 0348  GLUCAP 195*    Review of Systems:   Unable as patient is encephalopathic and intubated.   Past Medical History  She,  has a past medical history of Arthritis, AVD (aortic valve disease), CAD (coronary artery disease), Chronic obstructive bronchitis without exacerbation ([place]), CVA (cerebral infarction), Degeneration of meniscus of knee, Derangement of left knee, GERD (gastroesophageal reflux disease), HTN (hypertension), Hyperlipidemia, Hypertension, Obesity, and Vitamin D deficiency.   Surgical History    Past Surgical History:  Procedure Laterality Date  . ABDOMINAL HYSTERECTOMY    . AV FISTULA PLACEMENT Left 11/12/2017   Procedure: BRACHIOCEPHALIC ARTERIOVENOUS (AV) FISTULA CREATION LEFT UPPER EXTREMITY;  Surgeon: Angelia Mould, MD;  Location: [place];  Service: Vascular;  Laterality: Left;  . CHOLECYSTECTOMY    . CORONARY ANGIOPLASTY WITH STENT PLACEMENT    . ESOPHAGOGASTRODUODENOSCOPY N/A 07/23/2017   Procedure: ESOPHAGOGASTRODUODENOSCOPY (EGD);  Surgeon: Milus Banister, MD;  Location: [facility];  Service: Endoscopy;   Laterality: N/A;  . KNEE SURGERY    . SHOULDER SURGERY       Social History   reports that she has been smoking cigarettes. She has a 7.50 pack-year smoking history. She has never used smokeless tobacco. She reports that she does not drink alcohol or use drugs.   Family History   Her family history includes Diabetes in her maternal grandmother, mother, and sister; Hypertension in her mother; Stroke in her mother.   Allergies Allergies  Allergen Reactions  . Benazepril-Hydrochlorothiazide Other (See Comments)    UNKNOWN. Ok with lisinopril  . Chantix [Varenicline] Other (See Comments)    SUICIDAL IDEATION  . Codeine Nausea And Vomiting  . Methylprednisolone Other (See Comments)    UNKNOWN  . Statins Other (See Comments)    "walking into a hole", heavy legs, feels like she is going to fall     Home Medications  Prior to Admission medications   Medication Sig Start Date End Date Taking? Authorizing Provider  amLODipine (NORVASC) 10 MG tablet Take 1 tablet (10 mg total) by mouth daily. 12/25/17 01/24/18  Forde Dandy, PharmD  amLODipine (NORVASC) 10 MG tablet Take 10 mg by mouth daily.    [provider]  aspirin EC 81 MG tablet Take 1 tablet (81 mg total) by mouth daily. 06/02/16   Velna Ochs, MD  calcitRIOL (ROCALTROL) 0.25 MCG capsule Take 1 capsule (0.25 mcg total) by mouth daily. 12/25/17   Forde Dandy, PharmD  calcium acetate (PHOSLO) 667 MG capsule Take 667 mg by mouth daily. 09/25/17   [provider]  carvedilol (COREG) 25 MG tablet Take 1 tablet (25 mg total) by mouth 2 (two) times daily. 12/25/17 01/24/18  Forde Dandy, PharmD  carvedilol (COREG) 25 MG tablet Take 25 mg by mouth 2 (two) times daily with a meal.    [provider]  cholecalciferol (VITAMIN D) 1000 units tablet Take 1 tablet (1,000 Units total) by mouth daily. 07/26/17   Shelly Coss, MD  cloNIDine (CATAPRES) 0.1 MG tablet Take 1 tablet (0.1 mg total) by mouth 2 (two)  times daily. 12/25/17   Forde Dandy, PharmD  Ferrous Sulfate (IRON) 325 (65 Fe) MG TABS Take 325 mg by mouth daily. 09/23/17   [provider]  furosemide (LASIX) 20 MG tablet Take 1 tablet (20 mg total) by mouth daily. Patient not taking: Reported on 02/21/2018 12/25/17 12/25/18  Forde Dandy, PharmD  furosemide (LASIX) 40 MG tablet Take 40 mg by mouth daily.  12/08/17   [provider]  HYDROcodone-acetaminophen (NORCO/VICODIN) 5-325 MG tablet Take 1 tablet by mouth every 4 (four) hours as needed. 02/21/18   Isla Pence, MD  oxyCODONE (ROXICODONE) 5 MG immediate release tablet Take 1 tablet (5 mg total) by mouth every 4 (four) hours as needed for severe pain. Patient not taking: Reported on 02/21/2018 11/12/17   Angelia Mould, MD  sodium bicarbonate 650 MG tablet Take 2 tablets (1,300 mg total) by mouth daily. 12/25/17   Forde Dandy, PharmD  spironolactone (ALDACTONE) 25 MG tablet Take 1 tablet (25 mg total) by mouth daily. 12/25/17   Forde Dandy, PharmD     Critical care time: 35 mins.    Georgann Housekeeper, AGACNP-BC [place] Pager [phone] or [phone]  03/22/2018 5:57 AM

## 2018-03-22 NOTE — Procedures (Signed)
Central Venous Trialysis Catheter Insertion Procedure Note Stephanie Gross [id number] 1946-04-10  Procedure: Insertion of Central Venous Catheter Indications: Dialysis access  Procedure Details Consent: Risks of procedure as well as the alternatives and risks of each were explained to the (patient/caregiver).  Consent for procedure obtained. Time Out: Verified patient identification, verified procedure, site/side was marked, verified correct patient position, special equipment/implants available, medications/allergies/relevent history reviewed, required imaging and test results available.  Performed  Maximum sterile technique was used including antiseptics, cap, gloves, gown, hand hygiene, mask and sheet. Skin prep: Chlorhexidine; local anesthetic administered A antimicrobial bonded/coated triple lumen catheter was placed in the right internal jugular vein using the Seldinger technique.  Evaluation Blood flow good Complications: No apparent complications Patient did tolerate procedure well. Chest X-ray ordered to verify placement.  CXR: pending.  U/S used in placement  Sean Malinowski 03/22/2018, 3:31 PM

## 2018-03-22 NOTE — ED Notes (Signed)
Spoke to son who reports pt still produces urine

## 2018-03-22 NOTE — ED Notes (Signed)
Stephanie Gross [phone]  Would like to be called with bed assignment if not back before then

## 2018-03-23 ENCOUNTER — Inpatient Hospital Stay (HOSPITAL_COMMUNITY): Payer: Medicare Other

## 2018-03-23 DIAGNOSIS — T82898A Other specified complication of vascular prosthetic devices, implants and grafts, initial encounter: Secondary | ICD-10-CM

## 2018-03-23 DIAGNOSIS — Z992 Dependence on renal dialysis: Secondary | ICD-10-CM

## 2018-03-23 DIAGNOSIS — I10 Essential (primary) hypertension: Secondary | ICD-10-CM

## 2018-03-23 DIAGNOSIS — J9600 Acute respiratory failure, unspecified whether with hypoxia or hypercapnia: Secondary | ICD-10-CM

## 2018-03-23 DIAGNOSIS — G9340 Encephalopathy, unspecified: Secondary | ICD-10-CM

## 2018-03-23 DIAGNOSIS — N186 End stage renal disease: Secondary | ICD-10-CM

## 2018-03-23 LAB — BLOOD GAS, ARTERIAL
ACID-BASE DEFICIT: 0.8 mmol/L (ref 0.0–2.0)
Bicarbonate: 22.5 mmol/L (ref 20.0–28.0)
DRAWN BY: 252031
FIO2: 40
MECHVT: 450 mL
O2 SAT: 99.1 %
PEEP/CPAP: 5 cmH2O
PH ART: 7.467 — AB (ref 7.350–7.450)
PO2 ART: 146 mmHg — AB (ref 83.0–108.0)
Patient temperature: 98.6
RATE: 16 resp/min
pCO2 arterial: 31.5 mmHg — ABNORMAL LOW (ref 32.0–48.0)

## 2018-03-23 LAB — TRIGLYCERIDES: TRIGLYCERIDES: 130 mg/dL (ref <150)

## 2018-03-23 LAB — CBC
HCT: 33.3 % — ABNORMAL LOW (ref 36.0–46.0)
HEMOGLOBIN: 9.9 g/dL — AB (ref 12.0–15.0)
MCH: 28.8 pg (ref 26.0–34.0)
MCHC: 29.7 g/dL — AB (ref 30.0–36.0)
MCV: 96.8 fL (ref 80.0–100.0)
Platelets: 264 10*3/uL (ref 150–400)
RBC: 3.44 MIL/uL — ABNORMAL LOW (ref 3.87–5.11)
RDW: 15.1 % (ref 11.5–15.5)
WBC: 7.6 10*3/uL (ref 4.0–10.5)
nRBC: 0 % (ref 0.0–0.2)

## 2018-03-23 LAB — GLUCOSE, CAPILLARY
GLUCOSE-CAPILLARY: 85 mg/dL (ref 70–99)
Glucose-Capillary: 146 mg/dL — ABNORMAL HIGH (ref 70–99)
Glucose-Capillary: 80 mg/dL (ref 70–99)
Glucose-Capillary: 84 mg/dL (ref 70–99)
Glucose-Capillary: 89 mg/dL (ref 70–99)
Glucose-Capillary: 94 mg/dL (ref 70–99)

## 2018-03-23 LAB — BASIC METABOLIC PANEL
Anion gap: 15 (ref 5–15)
BUN: 41 mg/dL — AB (ref 8–23)
CHLORIDE: 101 mmol/L (ref 98–111)
CO2: 21 mmol/L — ABNORMAL LOW (ref 22–32)
CREATININE: 4.53 mg/dL — AB (ref 0.44–1.00)
Calcium: 9.2 mg/dL (ref 8.9–10.3)
GFR calc Af Amer: 10 mL/min — ABNORMAL LOW (ref >60)
GFR calc non Af Amer: 9 mL/min — ABNORMAL LOW (ref >60)
Glucose, Bld: 89 mg/dL (ref 70–99)
Potassium: 3.8 mmol/L (ref 3.5–5.1)
SODIUM: 137 mmol/L (ref 135–145)

## 2018-03-23 LAB — MAGNESIUM: MAGNESIUM: 1.9 mg/dL (ref 1.7–2.4)

## 2018-03-23 LAB — PHOSPHORUS: Phosphorus: 6.7 mg/dL — ABNORMAL HIGH (ref 2.5–4.6)

## 2018-03-23 MED ORDER — CALCITRIOL 0.5 MCG PO CAPS
0.5000 ug | ORAL_CAPSULE | ORAL | Status: DC
  Administered 2018-03-24 – 2018-03-27 (×2): 0.5 ug via ORAL
  Filled 2018-03-23: qty 2

## 2018-03-23 MED ORDER — CALCIUM ACETATE (PHOS BINDER) 667 MG PO CAPS
667.0000 mg | ORAL_CAPSULE | Freq: Three times a day (TID) | ORAL | Status: DC
  Administered 2018-03-24 – 2018-03-27 (×8): 667 mg via ORAL
  Filled 2018-03-23 (×9): qty 1

## 2018-03-23 MED ORDER — CLONIDINE HCL 0.1 MG PO TABS
0.1000 mg | ORAL_TABLET | Freq: Two times a day (BID) | ORAL | Status: DC
  Administered 2018-03-23 – 2018-03-26 (×5): 0.1 mg via ORAL
  Filled 2018-03-23 (×5): qty 1

## 2018-03-23 MED ORDER — "THROMBI-PAD 3""X3"" EX PADS"
1.0000 | MEDICATED_PAD | Freq: Once | CUTANEOUS | Status: AC
  Administered 2018-03-23: 1 via TOPICAL
  Filled 2018-03-23: qty 1

## 2018-03-23 MED ORDER — HEPARIN SODIUM (PORCINE) 1000 UNIT/ML IJ SOLN
INTRAMUSCULAR | Status: AC
  Filled 2018-03-23: qty 2

## 2018-03-23 NOTE — Progress Notes (Signed)
NAME:  Stephanie Gross, MRN:  [id number], DOB:  [birth year], LOS: 1 ADMISSION DATE:  03/22/2018, CONSULTATION DATE:  03/22/2018 REFERRING MD:  Dr. Roxanne Mins EDP, CHIEF COMPLAINT:  SOB  Brief History   72 year old female with ESRD on HD intubated in ED for hypoxia/pulm edema after being unable to receive HD due to graft issues.   History of present illness   72 year old female with past medical history as below, which is significant for end-stage renal disease on hemodialysis, COPD, CVA, and hypertension.  She had left upper extremity fistula placed back in July of this year and it matured as expected.  It appears as though she began using this in late October and initially had no complications, however, now in late November there have been some issues accessing her graft.  Her son states that on more than one occasion the dialysis nurses have been unable to access it and have asked her to come back the following day.  He also reports there has been some edema in the area of the fistula.  Her last dialysis treatment was 11/21, which was without complication.  She then would have been scheduled for dialysis on 11/23, however, the staff at the HD center was unable to access her fistula.  Then in the early a.m. hours of 11/25 she woke up complaining of shortness of breath and heat flashes.  Son also reports coughing, which she is" clear whether or not this is productive.  He denies that she complained of fever/chills, nausea/vomiting, and chest pain.  EMS was called, but by the time of their arrival she was minimally responsive and required bag mask ventilation in route to the emergency department where she was intubated for hypoxia and airway protection.  Imaging concerning for pulmonary edema.  PCCM asked to admit.  Significant Hospital Events   11/25 admit  Consults:  Nephrology 11/25 >  Procedures:  ETT 11/25 >  Significant Diagnostic Tests:  N/A  Micro Data:  Blood 11/25 >NTD Tracheal  aspirate 11/25 >NTD  Antimicrobials:  None  Interim history/subjective:  No events overnight, no new complaints, wants the ETT out  Objective   Blood pressure 136/64, pulse (!) 119, temperature 98.6 F (37 C), temperature source Oral, resp. rate 19, height 5\' 5"  (1.651 m), weight 51.5 kg, SpO2 99 %.    Vent Mode: PSV;CPAP FiO2 (%):  [40 %] 40 % Set Rate:  [16 bmp] 16 bmp Vt Set:  [450 mL] 450 mL PEEP:  [5 cmH20] 5 cmH20 Pressure Support:  [5 cmH20-10 cmH20] 10 cmH20 Plateau Pressure:  [16 cmH20-18 cmH20] 17 cmH20   Intake/Output Summary (Last 24 hours) at 03/23/2018 0920 Last data filed at 03/23/2018 0800 Gross per 24 hour  Intake 674.72 ml  Output 3350 ml  Net -2675.28 ml   Filed Weights   03/22/18 0429 03/23/18 0500  Weight: 58.5 kg 51.5 kg    Examination: General: Elderly female, resting in exam bed, awake and weaning HENT: [place]/AT, PERRL, EOM-I and MMM Lungs: Coarse BS diffusely Cardiovascular: RRR, Nl S1/S2 and -M/R/G Abdomen: Soft, NT, ND and +BS Extremities: -edema and -tenderness Neuro: Awake and interactive, moving ext to command Skin: [facility] Problem list     Assessment & Plan:   Acute hypoxemic respiratory failure: in the setting of pulmonary edema/hypervolemia 24 hours after not receiving HD on her scheduled day. Cannot rule out pneumonia, but based on her history this seems much less likely.  - Extubate today -  Titrate o2 for sat of 88-92% - IS per RT protocol - Nephrology for HD/volume removal.  - Flutter valve - No need for abx for now - Ambulate  ESRD on HD:  Typically Tues, Thurs, Sat schedule. There have been issues accessing her graft. Positive bruit and thrill on my assessment in ED.  Last Tx 11/21. Did not receive HD 11/13 due to graft issue. - HD done yesterday and volume negative - May need vascular to evaluate graft once she is more stable but will defer that for now - BMET - KVO IVF - Replace electrolytes as  indicated  Hypertensive crisis: BP better controlled now on propofol infusion - SBP goal 150 - 170 mmHg for first 6 hours (until 0900), then < 160 mmHg - Will add PRN hydralazine - Plan to resume home amlodipine, carvedilol, clonidine after extubation, she is stable for now - Volume removal  SIRS without clear infectious source.  - Send blood/sputum cultures - Hold of on ABX - Trend WBC, fever curve.   NAG Acidosis - D/C Bicarb  Anemia hemoglobin seems to be at about her baseline. - Follow CBC - Subcutaneous heparin for VTE ppx.   Hyperkalemia - HD per renal - Follow up BMP - Replace electrolytes as indicated  Patient updated bedside, all questions answered  Best practice:  Diet: NPO Pain/Anxiety/Delirium protocol (if indicated): Propofol infusion to keep RASS -1 to -2.  VAP protocol (if indicated): Per protocol DVT prophylaxis: SQ heparin GI prophylaxis: Protonix Glucose control: SSI Mobility: Bed rest Code Status: Full code Family Communication: Two sons updated in ED Disposition: ICU  Labs   CBC: Recent Labs  Lab 03/22/18 0358 03/22/18 0428 03/23/18 0528  WBC  --  16.8* 7.6  HGB 11.2* 10.1* 9.9*  HCT 33.0* 37.7 33.3*  MCV  --  104.4* 96.8  PLT  --  325 546    Basic Metabolic Panel: Recent Labs  Lab 03/22/18 0358 03/22/18 0428 03/23/18 0451  NA 140 139 137  K 4.8 5.3* 3.8  CL 114* 111 101  CO2  --  18* 21*  GLUCOSE 217* 246* 89  BUN 81* 72* 41*  CREATININE 6.20* 6.15* 4.53*  CALCIUM  --  8.4* 9.2  MG  --   --  1.9  PHOS  --   --  6.7*   GFR: Estimated Creatinine Clearance: 9.4 mL/min (A) (by C-G formula based on SCr of 4.53 mg/dL (H)). Recent Labs  Lab 03/22/18 0428 03/22/18 0622 03/23/18 0528  PROCALCITON  --  0.43  --   WBC 16.8*  --  7.6    Liver Function Tests: No results for input(s): AST, ALT, ALKPHOS, BILITOT, PROT, ALBUMIN in the last 168 hours. No results for input(s): LIPASE, AMYLASE in the last 168 hours. No results for  input(s): AMMONIA in the last 168 hours.  ABG    Component Value Date/Time   PHART 7.467 (H) 03/23/2018 0330   PCO2ART 31.5 (L) 03/23/2018 0330   PO2ART 146 (H) 03/23/2018 0330   HCO3 22.5 03/23/2018 0330   TCO2 20 (L) 03/22/2018 1109   ACIDBASEDEF 0.8 03/23/2018 0330   O2SAT 99.1 03/23/2018 0330     Coagulation Profile: No results for input(s): INR, PROTIME in the last 168 hours.  Cardiac Enzymes: Recent Labs  Lab 03/22/18 0622  TROPONINI 0.34*    HbA1C: Hgb A1c MFr Bld  Date/Time Value Ref Range Status  03/22/2018 04:28 AM 4.9 4.8 - 5.6 % Final    Comment:    (  NOTE) Pre diabetes:          5.7%-6.4% Diabetes:              >6.4% Glycemic control for   <7.0% adults with diabetes   07/21/2017 02:16 PM 5.2 4.8 - 5.6 % Final    Comment:    (NOTE) Pre diabetes:          5.7%-6.4% Diabetes:              >6.4% Glycemic control for   <7.0% adults with diabetes     CBG: Recent Labs  Lab 03/22/18 1548 03/22/18 2008 03/23/18 0018 03/23/18 0427 03/23/18 0815  GLUCAP 77 83 84 89 94    Review of Systems:   Unable as patient is encephalopathic and intubated.   Past Medical History  She,  has a past medical history of Arthritis, AVD (aortic valve disease), CAD (coronary artery disease), Chronic obstructive bronchitis without exacerbation ([place]), CVA (cerebral infarction), Degeneration of meniscus of knee, Derangement of left knee, GERD (gastroesophageal reflux disease), HTN (hypertension), Hyperlipidemia, Hypertension, Obesity, and Vitamin D deficiency.   Surgical History    Past Surgical History:  Procedure Laterality Date  . ABDOMINAL HYSTERECTOMY    . AV FISTULA PLACEMENT Left 11/12/2017   Procedure: BRACHIOCEPHALIC ARTERIOVENOUS (AV) FISTULA CREATION LEFT UPPER EXTREMITY;  Surgeon: Angelia Mould, MD;  Location: [facility];  Service: Vascular;  Laterality: Left;  . CHOLECYSTECTOMY    . CORONARY ANGIOPLASTY WITH STENT PLACEMENT    .  ESOPHAGOGASTRODUODENOSCOPY N/A 07/23/2017   Procedure: ESOPHAGOGASTRODUODENOSCOPY (EGD);  Surgeon: Milus Banister, MD;  Location: [facility];  Service: Endoscopy;  Laterality: N/A;  . KNEE SURGERY    . SHOULDER SURGERY       Social History   reports that she has been smoking cigarettes. She has a 7.50 pack-year smoking history. She has never used smokeless tobacco. She reports that she does not drink alcohol or use drugs.   Family History   Her family history includes Diabetes in her maternal grandmother, mother, and sister; Hypertension in her mother; Stroke in her mother.   Allergies Allergies  Allergen Reactions  . Benazepril-Hydrochlorothiazide Other (See Comments)    UNKNOWN. Ok with lisinopril  . Chantix [Varenicline] Other (See Comments)    SUICIDAL IDEATION  . Codeine Nausea And Vomiting  . Methylprednisolone Other (See Comments)    UNKNOWN  . Statins Other (See Comments)    "walking into a hole", heavy legs, feels like she is going to fall     Home Medications  Prior to Admission medications   Medication Sig Start Date End Date Taking? Authorizing Provider  amLODipine (NORVASC) 10 MG tablet Take 1 tablet (10 mg total) by mouth daily. 12/25/17 01/24/18  Forde Dandy, PharmD  amLODipine (NORVASC) 10 MG tablet Take 10 mg by mouth daily.    [provider]  aspirin EC 81 MG tablet Take 1 tablet (81 mg total) by mouth daily. 06/02/16   Velna Ochs, MD  calcitRIOL (ROCALTROL) 0.25 MCG capsule Take 1 capsule (0.25 mcg total) by mouth daily. 12/25/17   Forde Dandy, PharmD  calcium acetate (PHOSLO) 667 MG capsule Take 667 mg by mouth daily. 09/25/17   [provider]  carvedilol (COREG) 25 MG tablet Take 1 tablet (25 mg total) by mouth 2 (two) times daily. 12/25/17 01/24/18  Forde Dandy, PharmD  carvedilol (COREG) 25 MG tablet Take 25 mg by mouth 2 (two) times daily with a meal.    [provider]  cholecalciferol (VITAMIN D) 1000 units  tablet Take 1 tablet (1,000 Units total) by mouth daily. 07/26/17   Shelly Coss, MD  cloNIDine (CATAPRES) 0.1 MG tablet Take 1 tablet (0.1 mg total) by mouth 2 (two) times daily. 12/25/17   Forde Dandy, PharmD  Ferrous Sulfate (IRON) 325 (65 Fe) MG TABS Take 325 mg by mouth daily. 09/23/17   [provider]  furosemide (LASIX) 20 MG tablet Take 1 tablet (20 mg total) by mouth daily. Patient not taking: Reported on 02/21/2018 12/25/17 12/25/18  Forde Dandy, PharmD  furosemide (LASIX) 40 MG tablet Take 40 mg by mouth daily.  12/08/17   [provider]  HYDROcodone-acetaminophen (NORCO/VICODIN) 5-325 MG tablet Take 1 tablet by mouth every 4 (four) hours as needed. 02/21/18   Isla Pence, MD  oxyCODONE (ROXICODONE) 5 MG immediate release tablet Take 1 tablet (5 mg total) by mouth every 4 (four) hours as needed for severe pain. Patient not taking: Reported on 02/21/2018 11/12/17   Angelia Mould, MD  sodium bicarbonate 650 MG tablet Take 2 tablets (1,300 mg total) by mouth daily. 12/25/17   Forde Dandy, PharmD  spironolactone (ALDACTONE) 25 MG tablet Take 1 tablet (25 mg total) by mouth daily. 12/25/17   Forde Dandy, PharmD    The patient is critically ill with multiple organ systems failure and requires high complexity decision making for assessment and support, frequent evaluation and titration of therapies, application of advanced monitoring technologies and extensive interpretation of multiple databases.   Critical Care Time devoted to patient care services described in this note is  34  Minutes. This time reflects time of care of this signee Dr Jennet Maduro. This critical care time does not reflect procedure time, or teaching time or supervisory time of PA/NP/Med student/Med Resident etc but could involve care discussion time.  Rush Farmer, M.D. [facility]/Critical Care Medicine. Pager: [phone]. After hours pager: [phone].

## 2018-03-23 NOTE — Procedures (Signed)
Extubation Procedure Note  Patient Details:   Name: Stephanie Gross DOB: 08/12/1945 MRN: [id number]   Airway Documentation:    Vent end date: 03/23/18 Vent end time: 0829   Evaluation  O2 sats: stable throughout Complications: No apparent complications Patient did tolerate procedure well. Bilateral Breath Sounds: Clear   Yes   Patient was extubated to a 4L [place] without any complications, dyspnea or stridor noted. Patient was instructed on IS x 5, highest goal achieved was 1264mL.   Quentin Strebel, [place] 03/23/2018, 8:29 AM

## 2018-03-23 NOTE — Progress Notes (Signed)
[facility] Progress Note  Subjective: extubated this am, CXR cleared up , 3L UF yest on HD w/ clearing of bilat infiltrates on post HD CXR's.   Vitals:   03/23/18 0700 03/23/18 0754 03/23/18 0829 03/23/18 0900  BP: 140/72 140/72 (!) 174/80 136/64  Pulse: (!) 107 (!) 112 (!) 111 (!) 119  Resp: 16 16 16 19   Temp:  98.6 F (37 C)    TempSrc:  Oral    SpO2: 100% 100% 99% 99%  Weight:      Height:        Inpatient medications: . Chlorhexidine Gluconate Cloth  6 each Topical Q0600  . Chlorhexidine Gluconate Cloth  6 each Topical Q0600  . heparin  5,000 Units Subcutaneous Q8H  . insulin aspart  0-9 Units Subcutaneous Q4H  . pantoprazole (PROTONIX) IV  40 mg Intravenous QHS  . sodium chloride flush  3 mL Intravenous Q12H   . sodium chloride    . nitroGLYCERIN Stopped (03/22/18 1910)  . propofol (DIPRIVAN) infusion Stopped (03/22/18 1910)   sodium chloride, heparin, heparin, hydrALAZINE, sodium chloride flush  Iron/TIBC/Ferritin/ %Sat    Component Value Date/Time   IRON 31 07/23/2017 0554   TIBC 176 (L) 07/23/2017 0554   FERRITIN 43 07/23/2017 0554   IRONPCTSAT 18 07/23/2017 0554    Exam: Gen extubated, alert, no distress Chest mild bibasilar rales, o/w clear RRR no MRG Abd soft ntnd no mass or ascites +bs Ext no LE or UE edema Neuro is alert, Ox 3 , nf L forearm AVF, R IJ temp cath   Home meds:  - amlodipine 10/ carvedilol 25 bid/ clonidine 0.1 bid/ furosemide 40 qd/ spironolactone 25 qd  - sod bicarbonate 1300 qd/ calcium acetate 1 ac  - aspirin 81  - oxycodone IR prn/ hydrocodone- acetaminophen qid prn   Dialysis: [facility]  4h  250/1.5  53.5kg  3K/2.5Ca  Hep 1600  - mircera 100 q 2, last 11.21, last Hb 8.4  - calc po 0.5 ug tiw, pth 1698   CXR - diffuse severe alveolar infiltrates bilat   Impression/ Plan: 1. Acute resp failure/ severe pulm edema - resolved mostly after HD x 1.  BP's stable. plan HD again today upstairs.  Will consult [facility]  for AVF problems.  2. ESRD on HD , started 3 wks ago. L AVF placed July 2019.   3. HTN - better, cont Rx 4. 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 Recent Labs  Lab 03/22/18 0428 03/23/18 0528  WBC 16.8* 7.6  HGB 10.1* 9.9*  HCT 37.7 33.3*  MCV 104.4* 96.8  PLT 325 264    ]

## 2018-03-23 NOTE — H&P (View-Only) (Signed)
Hospital Consult  VASCULAR SURGERY ASSESSMENT & PLAN:   POORLY FUNCTIONING LEFT UPPER ARM FISTULA: This patient had a left ulnar cephalic fistula done on 10/26/1599.  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  She was seen in follow-up on 12/23/2017 which time her duplex showed adequate maturation of her fistula with diameters of the fistula ranging from 0.53-0.58 cm.  The fistula had been working well until about a week ago when she began having problems with this.  Patient had a temporary dialysis catheter placed and we have been consulted to evaluate her fistula.    On exam she has an excellent thrill in the proximal fistula but in the central portion there is induration around the fistula.  There is no significant hematoma noted.  Of note, she was just extubated this morning. She is to be dialyzed today.  I have scheduled her for a fistulogram and possible venoplasty tomorrow with Dr. [name].  I have discussed the procedure and potential complications with her and she is agreeable to proceed.  Deitra Mayo, MD, [place] [phone] Office: [phone]   Reason for Consult:  Poorly functioning L arm fistula Requesting Physician:  Dr. Jonnie Finner MRN #:  [id number]  History of Present Illness: This is a 72 y.o. female admitted to the hospital with acute respiratory failure and severe pulmonary edema likely due to missed hemodialysis treatments.  She is being seen in consultation due to difficulty with cannulation of left arm arteriovenous fistula.  She is status post left ulnar to cephalic fistula by Dr. Scot Dock 10/2017.  Patient states that approximately 1 week ago she began to have trouble completing hemodialysis treatments from her fistula as well as difficulty with cannulation of fistula.  Since admission she had a temporary dialysis catheter placed in her right IJ.  Nephrology is planning to dialyze patient again today.  Patient states she has not eaten since yesterday  morning.  Past Medical History:  Diagnosis Date  . Arthritis   . AVD (aortic valve disease)   . CAD (coronary artery disease)    s/p RCA stent 2007. Myoview 2010 normal  . Chronic obstructive bronchitis without exacerbation ([place])   . 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 Degeneration of meniscus of knee   . Derangement of left knee   . GERD (gastroesophageal reflux disease)   . HTN (hypertension)   . Hyperlipidemia   . Hypertension   . Obesity   . Vitamin D deficiency     Past Surgical History:  Procedure Laterality Date  . ABDOMINAL HYSTERECTOMY    . AV FISTULA PLACEMENT Left 11/12/2017   Procedure: BRACHIOCEPHALIC ARTERIOVENOUS (AV) FISTULA CREATION LEFT UPPER EXTREMITY;  Surgeon: Angelia Mould, MD;  Location: [facility];  Service: Vascular;  Laterality: Left;  . CHOLECYSTECTOMY    . CORONARY ANGIOPLASTY WITH STENT PLACEMENT    . ESOPHAGOGASTRODUODENOSCOPY N/A 07/23/2017   Procedure: ESOPHAGOGASTRODUODENOSCOPY (EGD);  Surgeon: Milus Banister, MD;  Location: [facility];  Service: Endoscopy;  Laterality: N/A;  . KNEE SURGERY    . SHOULDER SURGERY      Allergies  Allergen Reactions  . Benazepril-Hydrochlorothiazide Other (See Comments)    UNKNOWN. Ok with lisinopril  . Chantix [Varenicline] Other (See Comments)    SUICIDAL IDEATION  . Codeine Nausea And Vomiting  . Methylprednisolone Other (See Comments)    UNKNOWN  . Statins Other (See Comments)    "walking into a hole", heavy legs, feels like  she is going to fall    Prior to Admission medications   Medication Sig Start Date End Date Taking? Authorizing Provider  amLODipine (NORVASC) 10 MG tablet Take 1 tablet (10 mg total) by mouth daily. 12/25/17 03/22/18 Yes Forde Dandy, PharmD  calcitRIOL (ROCALTROL) 0.25 MCG capsule Take 1 capsule (0.25 mcg total) by mouth daily. 12/25/17  Yes Forde Dandy, PharmD  calcitRIOL (ROCALTROL) 0.5 MCG capsule Take 0.5 mcg by  mouth daily.   Yes [provider]  calcium acetate (PHOSLO) 667 MG capsule Take 667 mg by mouth 3 (three) times daily with meals.  09/25/17  Yes [provider]  carvedilol (COREG) 25 MG tablet Take 1 tablet (25 mg total) by mouth 2 (two) times daily. 12/25/17 03/22/18 Yes Forde Dandy, PharmD  cholecalciferol (VITAMIN D) 1000 units tablet Take 1 tablet (1,000 Units total) by mouth daily. 07/26/17  Yes Shelly Coss, MD  cloNIDine (CATAPRES) 0.1 MG tablet Take 1 tablet (0.1 mg total) by mouth 2 (two) times daily. 12/25/17  Yes Forde Dandy, PharmD  Ferrous Sulfate (IRON) 325 (65 Fe) MG TABS Take 325 mg by mouth daily. 09/23/17  Yes [provider]  furosemide (LASIX) 20 MG tablet Take 1 tablet (20 mg total) by mouth daily. 12/25/17 12/25/18 Yes Forde Dandy, PharmD  sodium bicarbonate 650 MG tablet Take 2 tablets (1,300 mg total) by mouth daily. Patient taking differently: Take 1,300 mg by mouth 2 (two) times daily.  12/25/17  Yes Forde Dandy, PharmD  spironolactone (ALDACTONE) 25 MG tablet Take 1 tablet (25 mg total) by mouth daily. 12/25/17  Yes Forde Dandy, PharmD  aspirin EC 81 MG tablet Take 1 tablet (81 mg total) by mouth daily. Patient not taking: Reported on 03/22/2018 06/02/16   Velna Ochs, MD  HYDROcodone-acetaminophen (NORCO/VICODIN) 5-325 MG tablet Take 1 tablet by mouth every 4 (four) hours as needed. Patient not taking: Reported on 03/22/2018 02/21/18   Isla Pence, MD  oxyCODONE (ROXICODONE) 5 MG immediate release tablet Take 1 tablet (5 mg total) by mouth every 4 (four) hours as needed for severe pain. Patient not taking: Reported on 02/21/2018 11/12/17   Angelia Mould, MD    Social History   Socioeconomic History  . Marital status: Single    Spouse name: Not on file  . Number of children: Not on file  . Years of education: Not on file  . Highest education level: Not on file  Occupational History  . Occupation:  unemployed  Social Needs  . Financial resource strain: Not on file  . Food insecurity:    Worry: Not on file    Inability: Not on file  . Transportation needs:    Medical: Not on file    Non-medical: Not on file  Tobacco Use  . Smoking status: Current Some Day Smoker    Packs/day: 0.25    Years: 30.00    Pack years: 7.50    Types: Cigarettes  . Smokeless tobacco: Never Used  . Tobacco comment: Doesn't smoke all of it. "Lights up and throws it away"  Substance and Sexual Activity  . Alcohol use: No    Alcohol/week: 0.0 standard drinks  . Drug use: No  . Sexual activity: Not on file  Lifestyle  . Physical activity:    Days per week: Not on file    Minutes per session: Not on file  . Stress: Not on file  Relationships  . Social connections:    Talks  on phone: Not on file    Gets together: Not on file    Attends religious service: Not on file    Active member of club or organization: Not on file    Attends meetings of clubs or organizations: Not on file    Relationship status: Not on file  . Intimate partner violence:    Fear of current or ex partner: Not on file    Emotionally abused: Not on file    Physically abused: Not on file    Forced sexual activity: Not on file  Other Topics Concern  . Not on file  Social History Narrative   ** Merged History Encounter **       Patient recently lost her job and is currently without insurance. - Nov 2012     Family History  Problem Relation Age of Onset  . Stroke Mother   . Diabetes Mother   . Hypertension Mother   . Diabetes Maternal Grandmother   . Diabetes Sister     ROS: Otherwise negative unless mentioned in HPI  Physical Examination  Vitals:   03/23/18 0900 03/23/18 1000  BP: 136/64 (!) 141/61  Pulse: (!) 119 (!) 116  Resp: 19 14  Temp:    SpO2: 99% 100%   Body mass index is 18.89 kg/m.  General:  WDWN in NAD Gait: Not observed HENT: WNL, normocephalic Pulmonary: normal non-labored breathing on  [place] Cardiac: Tachycardic Abdomen:  soft, NT/ND, no masses Skin: without rashes Vascular Exam/Pulses: Symmetrical radial pulses Extremities: Palpable thrill near left AC fossa, more pulsatile through mid upper arm, soft thrill closer to the shoulder Musculoskeletal: no muscle wasting or atrophy  Neurologic: A&O X 3;  No focal weakness or paresthesias are detected; speech is fluent/normal Psychiatric:  The pt has Normal affect. Lymph:  Unremarkable  CBC    Component Value Date/Time   WBC 7.6 03/23/2018 0528   RBC 3.44 (L) 03/23/2018 0528   HGB 9.9 (L) 03/23/2018 0528   HGB 9.3 (L) 08/03/2017 1635   HCT 33.3 (L) 03/23/2018 0528   HCT 29.0 (L) 08/03/2017 1635   PLT 264 03/23/2018 0528   PLT 231 08/03/2017 1635   MCV 96.8 03/23/2018 0528   MCV 90 08/03/2017 1635   MCH 28.8 03/23/2018 0528   MCHC 29.7 (L) 03/23/2018 0528   RDW 15.1 03/23/2018 0528   RDW 15.5 (H) 08/03/2017 1635   LYMPHSABS 1.4 02/21/2018 1257   LYMPHSABS 2.1 08/06/2015 1610   MONOABS 0.5 02/21/2018 1257   EOSABS 0.1 02/21/2018 1257   EOSABS 0.1 08/06/2015 1610   BASOSABS 0.1 02/21/2018 1257   BASOSABS 0.1 08/06/2015 1610    BMET    Component Value Date/Time   NA 137 03/23/2018 0451   NA 141 08/17/2017 1625   K 3.8 03/23/2018 0451   CL 101 03/23/2018 0451   CO2 21 (L) 03/23/2018 0451   GLUCOSE 89 03/23/2018 0451   BUN 41 (H) 03/23/2018 0451   BUN 26 08/17/2017 1625   CREATININE 4.53 (H) 03/23/2018 0451   CREATININE 1.17 (H) 10/22/2012 1324   CALCIUM 9.2 03/23/2018 0451   CALCIUM 10.0 02/07/2012 1505   GFRNONAA 9 (L) 03/23/2018 0451   GFRNONAA 49 (L) 10/22/2012 1324   GFRAA 10 (L) 03/23/2018 0451   GFRAA 57 (L) 10/22/2012 1324    COAGS: Lab Results  Component Value Date   INR 0.90 02/07/2012     ASSESSMENT/PLAN: This is a 72 y.o. female with end-stage renal disease on hemodialysis having problems  with left arm ulnar to cephalic fistula  Left arm AV fistula still patent, somewhat pulsatile in  mid upper arm Patient currently dialyzing from temporary catheter and right IJ Will discuss utility of left arm fistulogram with on-call vascular surgeon Dr. Scot Dock Keep n.p.o. for now   Dagoberto Ligas PA-C [facility] [phone]

## 2018-03-23 NOTE — Progress Notes (Signed)
Pt returned from Dialysis - alert/oriented denies any pain - pt has a diet order now & helped her order something to eat - family at bedside

## 2018-03-23 NOTE — Consult Note (Addendum)
Hospital Consult  VASCULAR SURGERY ASSESSMENT & PLAN:   POORLY FUNCTIONING LEFT UPPER ARM FISTULA: This patient had a left ulnar cephalic fistula done on 08/04/8117.  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  She was seen in follow-up on 12/23/2017 which time her duplex showed adequate maturation of her fistula with diameters of the fistula ranging from 0.53-0.58 cm.  The fistula had been working well until about a week ago when she began having problems with this.  Patient had a temporary dialysis catheter placed and we have been consulted to evaluate her fistula.    On exam she has an excellent thrill in the proximal fistula but in the central portion there is induration around the fistula.  There is no significant hematoma noted.  Of note, she was just extubated this morning. She is to be dialyzed today.  I have scheduled her for a fistulogram and possible venoplasty tomorrow with Dr. [name].  I have discussed the procedure and potential complications with her and she is agreeable to proceed.  Deitra Mayo, MD, [place] [phone] Office: [phone]   Reason for Consult:  Poorly functioning L arm fistula Requesting Physician:  Dr. Jonnie Finner MRN #:  [id number]  History of Present Illness: This is a 72 y.o. female admitted to the hospital with acute respiratory failure and severe pulmonary edema likely due to missed hemodialysis treatments.  She is being seen in consultation due to difficulty with cannulation of left arm arteriovenous fistula.  She is status post left ulnar to cephalic fistula by Dr. Scot Dock 10/2017.  Patient states that approximately 1 week ago she began to have trouble completing hemodialysis treatments from her fistula as well as difficulty with cannulation of fistula.  Since admission she had a temporary dialysis catheter placed in her right IJ.  Nephrology is planning to dialyze patient again today.  Patient states she has not eaten since yesterday  morning.  Past Medical History:  Diagnosis Date  . Arthritis   . AVD (aortic valve disease)   . CAD (coronary artery disease)    s/p RCA stent 2007. Myoview 2010 normal  . Chronic obstructive bronchitis without exacerbation ([place])   . 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 Degeneration of meniscus of knee   . Derangement of left knee   . GERD (gastroesophageal reflux disease)   . HTN (hypertension)   . Hyperlipidemia   . Hypertension   . Obesity   . Vitamin D deficiency     Past Surgical History:  Procedure Laterality Date  . ABDOMINAL HYSTERECTOMY    . AV FISTULA PLACEMENT Left 11/12/2017   Procedure: BRACHIOCEPHALIC ARTERIOVENOUS (AV) FISTULA CREATION LEFT UPPER EXTREMITY;  Surgeon: Angelia Mould, MD;  Location: [facility];  Service: Vascular;  Laterality: Left;  . CHOLECYSTECTOMY    . CORONARY ANGIOPLASTY WITH STENT PLACEMENT    . ESOPHAGOGASTRODUODENOSCOPY N/A 07/23/2017   Procedure: ESOPHAGOGASTRODUODENOSCOPY (EGD);  Surgeon: Milus Banister, MD;  Location: [facility];  Service: Endoscopy;  Laterality: N/A;  . KNEE SURGERY    . SHOULDER SURGERY      Allergies  Allergen Reactions  . Benazepril-Hydrochlorothiazide Other (See Comments)    UNKNOWN. Ok with lisinopril  . Chantix [Varenicline] Other (See Comments)    SUICIDAL IDEATION  . Codeine Nausea And Vomiting  . Methylprednisolone Other (See Comments)    UNKNOWN  . Statins Other (See Comments)    "walking into a hole", heavy legs, feels like  she is going to fall    Prior to Admission medications   Medication Sig Start Date End Date Taking? Authorizing Provider  amLODipine (NORVASC) 10 MG tablet Take 1 tablet (10 mg total) by mouth daily. 12/25/17 03/22/18 Yes Forde Dandy, PharmD  calcitRIOL (ROCALTROL) 0.25 MCG capsule Take 1 capsule (0.25 mcg total) by mouth daily. 12/25/17  Yes Forde Dandy, PharmD  calcitRIOL (ROCALTROL) 0.5 MCG capsule Take 0.5 mcg by  mouth daily.   Yes [provider]  calcium acetate (PHOSLO) 667 MG capsule Take 667 mg by mouth 3 (three) times daily with meals.  09/25/17  Yes [provider]  carvedilol (COREG) 25 MG tablet Take 1 tablet (25 mg total) by mouth 2 (two) times daily. 12/25/17 03/22/18 Yes Forde Dandy, PharmD  cholecalciferol (VITAMIN D) 1000 units tablet Take 1 tablet (1,000 Units total) by mouth daily. 07/26/17  Yes Shelly Coss, MD  cloNIDine (CATAPRES) 0.1 MG tablet Take 1 tablet (0.1 mg total) by mouth 2 (two) times daily. 12/25/17  Yes Forde Dandy, PharmD  Ferrous Sulfate (IRON) 325 (65 Fe) MG TABS Take 325 mg by mouth daily. 09/23/17  Yes [provider]  furosemide (LASIX) 20 MG tablet Take 1 tablet (20 mg total) by mouth daily. 12/25/17 12/25/18 Yes Forde Dandy, PharmD  sodium bicarbonate 650 MG tablet Take 2 tablets (1,300 mg total) by mouth daily. Patient taking differently: Take 1,300 mg by mouth 2 (two) times daily.  12/25/17  Yes Forde Dandy, PharmD  spironolactone (ALDACTONE) 25 MG tablet Take 1 tablet (25 mg total) by mouth daily. 12/25/17  Yes Forde Dandy, PharmD  aspirin EC 81 MG tablet Take 1 tablet (81 mg total) by mouth daily. Patient not taking: Reported on 03/22/2018 06/02/16   Velna Ochs, MD  HYDROcodone-acetaminophen (NORCO/VICODIN) 5-325 MG tablet Take 1 tablet by mouth every 4 (four) hours as needed. Patient not taking: Reported on 03/22/2018 02/21/18   Isla Pence, MD  oxyCODONE (ROXICODONE) 5 MG immediate release tablet Take 1 tablet (5 mg total) by mouth every 4 (four) hours as needed for severe pain. Patient not taking: Reported on 02/21/2018 11/12/17   Angelia Mould, MD    Social History   Socioeconomic History  . Marital status: Single    Spouse name: Not on file  . Number of children: Not on file  . Years of education: Not on file  . Highest education level: Not on file  Occupational History  . Occupation:  unemployed  Social Needs  . Financial resource strain: Not on file  . Food insecurity:    Worry: Not on file    Inability: Not on file  . Transportation needs:    Medical: Not on file    Non-medical: Not on file  Tobacco Use  . Smoking status: Current Some Day Smoker    Packs/day: 0.25    Years: 30.00    Pack years: 7.50    Types: Cigarettes  . Smokeless tobacco: Never Used  . Tobacco comment: Doesn't smoke all of it. "Lights up and throws it away"  Substance and Sexual Activity  . Alcohol use: No    Alcohol/week: 0.0 standard drinks  . Drug use: No  . Sexual activity: Not on file  Lifestyle  . Physical activity:    Days per week: Not on file    Minutes per session: Not on file  . Stress: Not on file  Relationships  . Social connections:    Talks  on phone: Not on file    Gets together: Not on file    Attends religious service: Not on file    Active member of club or organization: Not on file    Attends meetings of clubs or organizations: Not on file    Relationship status: Not on file  . Intimate partner violence:    Fear of current or ex partner: Not on file    Emotionally abused: Not on file    Physically abused: Not on file    Forced sexual activity: Not on file  Other Topics Concern  . Not on file  Social History Narrative   ** Merged History Encounter **       Patient recently lost her job and is currently without insurance. - Nov 2012     Family History  Problem Relation Age of Onset  . Stroke Mother   . Diabetes Mother   . Hypertension Mother   . Diabetes Maternal Grandmother   . Diabetes Sister     ROS: Otherwise negative unless mentioned in HPI  Physical Examination  Vitals:   03/23/18 0900 03/23/18 1000  BP: 136/64 (!) 141/61  Pulse: (!) 119 (!) 116  Resp: 19 14  Temp:    SpO2: 99% 100%   Body mass index is 18.89 kg/m.  General:  WDWN in NAD Gait: Not observed HENT: WNL, normocephalic Pulmonary: normal non-labored breathing on  [place] Cardiac: Tachycardic Abdomen:  soft, NT/ND, no masses Skin: without rashes Vascular Exam/Pulses: Symmetrical radial pulses Extremities: Palpable thrill near left AC fossa, more pulsatile through mid upper arm, soft thrill closer to the shoulder Musculoskeletal: no muscle wasting or atrophy  Neurologic: A&O X 3;  No focal weakness or paresthesias are detected; speech is fluent/normal Psychiatric:  The pt has Normal affect. Lymph:  Unremarkable  CBC    Component Value Date/Time   WBC 7.6 03/23/2018 0528   RBC 3.44 (L) 03/23/2018 0528   HGB 9.9 (L) 03/23/2018 0528   HGB 9.3 (L) 08/03/2017 1635   HCT 33.3 (L) 03/23/2018 0528   HCT 29.0 (L) 08/03/2017 1635   PLT 264 03/23/2018 0528   PLT 231 08/03/2017 1635   MCV 96.8 03/23/2018 0528   MCV 90 08/03/2017 1635   MCH 28.8 03/23/2018 0528   MCHC 29.7 (L) 03/23/2018 0528   RDW 15.1 03/23/2018 0528   RDW 15.5 (H) 08/03/2017 1635   LYMPHSABS 1.4 02/21/2018 1257   LYMPHSABS 2.1 08/06/2015 1610   MONOABS 0.5 02/21/2018 1257   EOSABS 0.1 02/21/2018 1257   EOSABS 0.1 08/06/2015 1610   BASOSABS 0.1 02/21/2018 1257   BASOSABS 0.1 08/06/2015 1610    BMET    Component Value Date/Time   NA 137 03/23/2018 0451   NA 141 08/17/2017 1625   K 3.8 03/23/2018 0451   CL 101 03/23/2018 0451   CO2 21 (L) 03/23/2018 0451   GLUCOSE 89 03/23/2018 0451   BUN 41 (H) 03/23/2018 0451   BUN 26 08/17/2017 1625   CREATININE 4.53 (H) 03/23/2018 0451   CREATININE 1.17 (H) 10/22/2012 1324   CALCIUM 9.2 03/23/2018 0451   CALCIUM 10.0 02/07/2012 1505   GFRNONAA 9 (L) 03/23/2018 0451   GFRNONAA 49 (L) 10/22/2012 1324   GFRAA 10 (L) 03/23/2018 0451   GFRAA 57 (L) 10/22/2012 1324    COAGS: Lab Results  Component Value Date   INR 0.90 02/07/2012     ASSESSMENT/PLAN: This is a 72 y.o. female with end-stage renal disease on hemodialysis having problems  with left arm ulnar to cephalic fistula  Left arm AV fistula still patent, somewhat pulsatile in  mid upper arm Patient currently dialyzing from temporary catheter and right IJ Will discuss utility of left arm fistulogram with on-call vascular surgeon Dr. Scot Dock Keep n.p.o. for now   Dagoberto Ligas PA-C [facility] [phone]

## 2018-03-23 NOTE — Progress Notes (Signed)
Pt received from [facility] as a transfer - Pt alert/oriented says she's just very tired today Aware she will have dialysis today

## 2018-03-23 NOTE — Plan of Care (Signed)
  Problem: Education: Goal: Knowledge of General Education information will improve Description Including pain rating scale, medication(s)/side effects and non-pharmacologic comfort measures Outcome: Progressing  Pt updated on POC for the shift and the POC for tomorrow. Pt had no further questions at this time.

## 2018-03-24 ENCOUNTER — Encounter (HOSPITAL_COMMUNITY): Payer: Self-pay | Admitting: Surgery

## 2018-03-24 ENCOUNTER — Encounter (HOSPITAL_COMMUNITY)
Admission: EM | Disposition: A | Discharge status: Home / Self Care | Payer: Self-pay | Source: Home / Self Care | Attending: Pulmonary Disease

## 2018-03-24 ENCOUNTER — Ambulatory Visit: Payer: Medicare Other | Admitting: Cardiovascular Disease

## 2018-03-24 HISTORY — PX: A/V FISTULAGRAM: CATH118298

## 2018-03-24 HISTORY — PX: PERIPHERAL VASCULAR BALLOON ANGIOPLASTY: CATH118281

## 2018-03-24 LAB — CBC
HEMATOCRIT: 31.6 % — AB (ref 36.0–46.0)
HEMOGLOBIN: 9.2 g/dL — AB (ref 12.0–15.0)
MCH: 28.5 pg (ref 26.0–34.0)
MCHC: 29.1 g/dL — AB (ref 30.0–36.0)
MCV: 97.8 fL (ref 80.0–100.0)
Platelets: 265 10*3/uL (ref 150–400)
RBC: 3.23 MIL/uL — ABNORMAL LOW (ref 3.87–5.11)
RDW: 14.8 % (ref 11.5–15.5)
WBC: 7.7 10*3/uL (ref 4.0–10.5)
nRBC: 0 % (ref 0.0–0.2)

## 2018-03-24 LAB — RENAL FUNCTION PANEL
Albumin: 2.8 g/dL — ABNORMAL LOW (ref 3.5–5.0)
Anion gap: 12 (ref 5–15)
BUN: 40 mg/dL — AB (ref 8–23)
CHLORIDE: 100 mmol/L (ref 98–111)
CO2: 26 mmol/L (ref 22–32)
CREATININE: 4.78 mg/dL — AB (ref 0.44–1.00)
Calcium: 9.2 mg/dL (ref 8.9–10.3)
GFR calc Af Amer: 10 mL/min — ABNORMAL LOW (ref >60)
GFR calc non Af Amer: 9 mL/min — ABNORMAL LOW (ref >60)
GLUCOSE: 113 mg/dL — AB (ref 70–99)
Phosphorus: 6.1 mg/dL — ABNORMAL HIGH (ref 2.5–4.6)
Potassium: 3.9 mmol/L (ref 3.5–5.1)
Sodium: 138 mmol/L (ref 135–145)

## 2018-03-24 LAB — HEPATITIS B CORE ANTIBODY, IGM: HEP B C IGM: NEGATIVE

## 2018-03-24 LAB — GLUCOSE, CAPILLARY
GLUCOSE-CAPILLARY: 86 mg/dL (ref 70–99)
Glucose-Capillary: 121 mg/dL — ABNORMAL HIGH (ref 70–99)
Glucose-Capillary: 232 mg/dL — ABNORMAL HIGH (ref 70–99)

## 2018-03-24 LAB — HEPATITIS B SURFACE ANTIBODY,QUALITATIVE: Hep B S Ab: NONREACTIVE

## 2018-03-24 LAB — HEPATITIS B SURFACE ANTIGEN: HEP B S AG: NEGATIVE

## 2018-03-24 SURGERY — A/V FISTULAGRAM
Anesthesia: LOCAL

## 2018-03-24 MED ORDER — SODIUM CHLORIDE 0.9 % IV SOLN
INTRAVENOUS | Status: DC | PRN
  Administered 2018-03-24: 250 mL via INTRAVENOUS

## 2018-03-24 MED ORDER — IODIXANOL 320 MG/ML IV SOLN
INTRAVENOUS | Status: DC | PRN
  Administered 2018-03-24: 40 mL via INTRAVENOUS

## 2018-03-24 MED ORDER — LIDOCAINE HCL (PF) 1 % IJ SOLN
INTRAMUSCULAR | Status: AC
  Filled 2018-03-24: qty 30

## 2018-03-24 MED ORDER — LIDOCAINE HCL (PF) 1 % IJ SOLN
INTRAMUSCULAR | Status: DC | PRN
  Administered 2018-03-24: 5 mL via INTRADERMAL

## 2018-03-24 MED ORDER — HEPARIN (PORCINE) IN NACL 1000-0.9 UT/500ML-% IV SOLN
INTRAVENOUS | Status: DC | PRN
  Administered 2018-03-24: 500 mL

## 2018-03-24 MED ORDER — FENTANYL CITRATE (PF) 100 MCG/2ML IJ SOLN
INTRAMUSCULAR | Status: AC
  Filled 2018-03-24: qty 2

## 2018-03-24 MED ORDER — HEPARIN SODIUM (PORCINE) 1000 UNIT/ML IJ SOLN
INTRAMUSCULAR | Status: AC
  Administered 2018-03-24: 1600 [IU] via INTRAVENOUS_CENTRAL
  Filled 2018-03-24: qty 2

## 2018-03-24 MED ORDER — FENTANYL CITRATE (PF) 100 MCG/2ML IJ SOLN
INTRAMUSCULAR | Status: DC | PRN
  Administered 2018-03-24 (×2): 25 ug via INTRAVENOUS

## 2018-03-24 MED ORDER — AMLODIPINE BESYLATE 10 MG PO TABS
10.0000 mg | ORAL_TABLET | Freq: Every day | ORAL | Status: DC
  Administered 2018-03-24 – 2018-03-26 (×3): 10 mg via ORAL
  Filled 2018-03-24 (×4): qty 1

## 2018-03-24 MED ORDER — VANCOMYCIN HCL IN DEXTROSE 1-5 GM/200ML-% IV SOLN
1000.0000 mg | Freq: Once | INTRAVENOUS | Status: AC
  Administered 2018-03-24: 1000 mg via INTRAVENOUS
  Filled 2018-03-24: qty 200

## 2018-03-24 MED ORDER — CALCITRIOL 0.5 MCG PO CAPS
ORAL_CAPSULE | ORAL | Status: AC
  Administered 2018-03-24: 0.5 ug via ORAL
  Filled 2018-03-24: qty 1

## 2018-03-24 MED ORDER — HEPARIN SODIUM (PORCINE) 1000 UNIT/ML IJ SOLN
INTRAMUSCULAR | Status: AC
  Administered 2018-03-24: 1000 [IU]
  Filled 2018-03-24: qty 3

## 2018-03-24 MED ORDER — CHLORHEXIDINE GLUCONATE CLOTH 2 % EX PADS: 6.0000 | MEDICATED_PAD | Freq: Every day | CUTANEOUS | Status: DC

## 2018-03-24 MED ORDER — HEPARIN (PORCINE) IN NACL 1000-0.9 UT/500ML-% IV SOLN
INTRAVENOUS | Status: AC
  Filled 2018-03-24: qty 500

## 2018-03-24 MED ORDER — HEPARIN SODIUM (PORCINE) 1000 UNIT/ML IJ SOLN
INTRAMUSCULAR | Status: AC
  Filled 2018-03-24: qty 3

## 2018-03-24 MED ORDER — CARVEDILOL 25 MG PO TABS
25.0000 mg | ORAL_TABLET | Freq: Two times a day (BID) | ORAL | Status: DC
  Administered 2018-03-24 – 2018-03-26 (×5): 25 mg via ORAL
  Filled 2018-03-24 (×5): qty 1

## 2018-03-24 MED ORDER — HEPARIN SODIUM (PORCINE) 1000 UNIT/ML DIALYSIS
1600.0000 [IU] | Freq: Once | INTRAMUSCULAR | Status: AC
  Administered 2018-03-24: 1600 [IU] via INTRAVENOUS_CENTRAL

## 2018-03-24 MED ORDER — MIDAZOLAM HCL 2 MG/2ML IJ SOLN
INTRAMUSCULAR | Status: AC
  Filled 2018-03-24: qty 2

## 2018-03-24 MED ORDER — MIDAZOLAM HCL 2 MG/2ML IJ SOLN
INTRAMUSCULAR | Status: DC | PRN
  Administered 2018-03-24: 1 mg via INTRAVENOUS

## 2018-03-24 SURGICAL SUPPLY — 14 items
BAG SNAP BAND KOVER 36X36 (MISCELLANEOUS) ×3 IMPLANT
BALLN MUSTANG 7X60X75 (BALLOONS) ×3
BALLOON MUSTANG 7X60X75 (BALLOONS) ×2 IMPLANT
COVER DOME SNAP 22 D (MISCELLANEOUS) ×3 IMPLANT
KIT ENCORE 26 ADVANTAGE (KITS) ×3 IMPLANT
KIT MICROPUNCTURE NIT STIFF (SHEATH) ×3 IMPLANT
PROTECTION STATION PRESSURIZED (MISCELLANEOUS) ×3
SHEATH PINNACLE R/O II 6F 4CM (SHEATH) ×3 IMPLANT
SHEATH PROBE COVER 6X72 (BAG) ×3 IMPLANT
STATION PROTECTION PRESSURIZED (MISCELLANEOUS) ×2 IMPLANT
STOPCOCK MORSE 400PSI 3WAY (MISCELLANEOUS) ×3 IMPLANT
TRAY PV CATH (CUSTOM PROCEDURE TRAY) ×3 IMPLANT
TUBING CIL FLEX 10 FLL-RA (TUBING) ×3 IMPLANT
WIRE BENTSON .035X145CM (WIRE) ×3 IMPLANT

## 2018-03-24 NOTE — Progress Notes (Signed)
Pt refuses IV.

## 2018-03-24 NOTE — Interval H&P Note (Signed)
History and Physical Interval Note:  03/24/2018 8:39 AM  Stephanie Gross  has presented today for surgery, with the diagnosis of instage renal  The various methods of treatment have been discussed with the patient and family. After consideration of risks, benefits and other options for treatment, the patient has consented to  Procedure(s): A/V FISTULAGRAM (N/A) as a surgical intervention .  The patient's history has been reviewed, patient examined, no change in status, stable for surgery.  I have reviewed the patient's chart and labs.  Questions were answered to the patient's satisfaction.     [name]

## 2018-03-24 NOTE — Progress Notes (Signed)
[facility] Progress Note Patient Name: Stephanie Gross DOB: 1945-09-13 MRN: [id number]   Date of Service  03/24/2018  HPI/Events of Note  Notified of Temp 101.9. Cultures from 2 days ago NGTD. Patient has indwelling dialysis catheter.  eICU Interventions  Blood culture x 2 ordered (one from dialysis cath) Ordered one dose of vancomycin     Intervention Category Major Interventions: Infection - evaluation and management  Judd Lien 03/24/2018, 10:18 PM

## 2018-03-24 NOTE — Progress Notes (Signed)
NAME:  Stephanie Gross, MRN:  [id number], DOB:  November 20, 1945, LOS: 2 ADMISSION DATE:  03/22/2018, CONSULTATION DATE:  03/22/2018 REFERRING MD:  Dr. Roxanne Mins EDP, CHIEF COMPLAINT:  SOB  Brief History   72 year old female with ESRD on HD intubated in ED for hypoxia/pulm edema after being unable to receive HD due to graft issues.   History of present illness   71 year old female with past medical history as below, which is significant for end-stage renal disease on hemodialysis, COPD, CVA, and hypertension.  She had left upper extremity fistula placed back in July of this year and it matured as expected.  It appears as though she began using this in late October and initially had no complications, however, now in late November there have been some issues accessing her graft.  Her son states that on more than one occasion the dialysis nurses have been unable to access it and have asked her to come back the following day.  He also reports there has been some edema in the area of the fistula.  Her last dialysis treatment was 11/21, which was without complication.  She then would have been scheduled for dialysis on 11/23, however, the staff at the HD center was unable to access her fistula.  Then in the early a.m. hours of 11/25 she woke up complaining of shortness of breath and heat flashes.  Son also reports coughing, which she is" clear whether or not this is productive.  He denies that she complained of fever/chills, nausea/vomiting, and chest pain.  EMS was called, but by the time of their arrival she was minimally responsive and required bag mask ventilation in route to the emergency department where she was intubated for hypoxia and airway protection.  Imaging concerning for pulmonary edema.  PCCM asked to admit.  Significant Hospital Events   11/25 admit  Consults:  Nephrology 11/25 > 03/23/2018 vascular surgery  Procedures:  ETT 11/25 >  Significant Diagnostic Tests:  Left AV graft  interrogation 03/24/2018 with stenosis repaired  Micro Data:  Blood 11/25 >NTD Tracheal aspirate 11/25 >NTD  Antimicrobials:  None  Interim history/subjective:  Extubated over 24 hours.  Left bicep graft is been interrogated and repaired by vascular surgery.  She is on room air eating we will transfer out of the intensive care unit.  We will also turn her over to the [facility] service  Objective   Blood pressure (!) 153/71, pulse 98, temperature 99.3 F (37.4 C), temperature source Oral, resp. rate 19, height 5\' 5"  (1.651 m), weight 47.7 kg, SpO2 99 %.        Intake/Output Summary (Last 24 hours) at 03/24/2018 0958 Last data filed at 03/24/2018 0400 Gross per 24 hour  Intake 390 ml  Output 125 ml  Net 265 ml   Filed Weights   03/23/18 0500 03/23/18 1316 03/23/18 1625  Weight: 51.5 kg 49.9 kg 47.7 kg    Examination: General: Frail appearing thin female no acute distress sitting up eating HEENT: Right IJ hemodialysis catheter in place Neuro: Alert no acute distress CV: Sounds are regular PULM: Clear to auscultation BM:WUXL, non-tender, bsx4 active  Extremities: Left bicep graft positive bruit and thrill Skin: no rashes or lesions   Resolved Hospital Problem list     Assessment & Plan:   Acute hypoxemic respiratory failure: in the setting of pulmonary edema/hypervolemia 24 hours after not receiving HD on her scheduled day. Cannot rule out pneumonia, but based on her history this seems much  less likely.  -Extubated 03/23/2018 -No acute distress  -Supplemental oxygen as needed  ESRD on HD:  Typically Tues, Thurs, Sat schedule. There have been issues accessing her graft. Positive bruit and thrill on my assessment in ED.  Last Tx 11/21. Did not receive HD 11/13 due to graft issue. -Dialysis done via temporary catheter -Graft was interrogated by vascular surgery 85/88/5027 cephalic vein stenosis was treated successfully   Hypertensive crisis:  -Systolic blood  pressure goal less than 170 -PRN hydralazine as needed -Clonidine has been resumed.  Resume home antihypertensives. -Volume removal via temporary HD catheter right IJ  SIRS without clear infectious source.  -Panculture -No antibiotics at this time -Monitor fever curve and WBC count  NAG Acidosis, resolved 03/24/2018 -Off of bicarb  Anemia hemoglobin seems to be at about her baseline.  Hemoglobin currently 9.9 -CBC -Transfuse per protocol  Hyperkalemia, resolved -Renal service -Serial electrolytes and replete as needed     Best practice:  Diet: Regular diet Pain/Anxiety/Delirium protocol (if indicated): None indicated VAP protocol (if indicated): Per protocol DVT prophylaxis: SQ heparin GI prophylaxis: Protonix Glucose control: SSI Mobility: Bed rest Code Status: Full code Family Communication: 03/24/2018 patient updated bedside, no family at bedside. Disposition: 03/24/2018 with transfer out of the intensive care regular unit and to [facility] service  Labs   CBC: Recent Labs  Lab 03/22/18 0358 03/22/18 0428 03/23/18 0528  WBC  --  16.8* 7.6  HGB 11.2* 10.1* 9.9*  HCT 33.0* 37.7 33.3*  MCV  --  104.4* 96.8  PLT  --  325 741    Basic Metabolic Panel: Recent Labs  Lab 03/22/18 0358 03/22/18 0428 03/23/18 0451  NA 140 139 137  K 4.8 5.3* 3.8  CL 114* 111 101  CO2  --  18* 21*  GLUCOSE 217* 246* 89  BUN 81* 72* 41*  CREATININE 6.20* 6.15* 4.53*  CALCIUM  --  8.4* 9.2  MG  --   --  1.9  PHOS  --   --  6.7*   GFR: Estimated Creatinine Clearance: 8.7 mL/min (A) (by C-G formula based on SCr of 4.53 mg/dL (H)). Recent Labs  Lab 03/22/18 0428 03/22/18 0622 03/23/18 0528  PROCALCITON  --  0.43  --   WBC 16.8*  --  7.6    Liver Function Tests: No results for input(s): AST, ALT, ALKPHOS, BILITOT, PROT, ALBUMIN in the last 168 hours. No results for input(s): LIPASE, AMYLASE in the last 168 hours. No results for input(s): AMMONIA in the last 168  hours.  ABG    Component Value Date/Time   PHART 7.467 (H) 03/23/2018 0330   PCO2ART 31.5 (L) 03/23/2018 0330   PO2ART 146 (H) 03/23/2018 0330   HCO3 22.5 03/23/2018 0330   TCO2 20 (L) 03/22/2018 1109   ACIDBASEDEF 0.8 03/23/2018 0330   O2SAT 99.1 03/23/2018 0330     Coagulation Profile: No results for input(s): INR, PROTIME in the last 168 hours.  Cardiac Enzymes: Recent Labs  Lab 03/22/18 0622  TROPONINI 0.34*    HbA1C: Hgb A1c MFr Bld  Date/Time Value Ref Range Status  03/22/2018 04:28 AM 4.9 4.8 - 5.6 % Final    Comment:    (NOTE) Pre diabetes:          5.7%-6.4% Diabetes:              >6.4% Glycemic control for   <7.0% adults with diabetes   07/21/2017 02:16 PM 5.2 4.8 - 5.6 % Final  Comment:    (NOTE) Pre diabetes:          5.7%-6.4% Diabetes:              >6.4% Glycemic control for   <7.0% adults with diabetes     CBG: Recent Labs  Lab 03/23/18 1152 03/23/18 1704 03/23/18 2038 03/24/18 0037 03/24/18 0805  GLUCAP 85 80 146* 121* 86    Steve Tarick Parenteau ACNP [facility] P[facility] Pager [phone] till 1 pm If no answer page [phone] 03/24/2018, 9:58 AM

## 2018-03-24 NOTE — Progress Notes (Signed)
RN removed pt foley at 7:40pm, shortly after pt transferred from [facility].

## 2018-03-24 NOTE — Progress Notes (Signed)
This IVT Nurse notified Primary Nurse at bedside that its not best practice to draw blood for culture from CVC due to contamination, MD also notified by phone.  With Dr. Raquel Sarna Aventura's written order , may draw blood from Right IJ HD/pigtail for the second set of blood culture. Specimen obtained and sent to lab.

## 2018-03-24 NOTE — Progress Notes (Signed)
Patient is scheduled for dialysis catheter on Friday.  NPO after midnight on Thursday.  Stephanie Gross

## 2018-03-24 NOTE — Progress Notes (Signed)
Pt had an axillary temp of 100. RN verified with an oral temp which turned out to be 101.9. Pt refuses oral temp

## 2018-03-24 NOTE — Progress Notes (Signed)
[facility] Progress Note  Subjective: had fistulogram w/ 60% mid AVF stenosis, got balloon angioplasty down to 10%.  Pt doing well, no c/o.   Vitals:   03/24/18 0900 03/24/18 0904 03/24/18 0905 03/24/18 0910  BP:  (!) 146/69 (!) 149/83 (!) 153/71  Pulse: 96 95 99 98  Resp: 12 15 16 19   Temp:      TempSrc:      SpO2: 100% 100% 100% 99%  Weight:      Height:        Inpatient medications: . amLODipine  10 mg Oral Daily  . calcitRIOL  0.5 mcg Oral Q T,Th,Sa-HD  . calcium acetate  667 mg Oral TID WC  . carvedilol  25 mg Oral BID WC  . Chlorhexidine Gluconate Cloth  6 each Topical Q0600  . Chlorhexidine Gluconate Cloth  6 each Topical Q0600  . [START ON 03/25/2018] Chlorhexidine Gluconate Cloth  6 each Topical Q0600  . cloNIDine  0.1 mg Oral BID  . heparin  5,000 Units Subcutaneous Q8H  . insulin aspart  0-9 Units Subcutaneous Q4H  . pantoprazole (PROTONIX) IV  40 mg Intravenous QHS  . sodium chloride flush  3 mL Intravenous Q12H   . sodium chloride    . nitroGLYCERIN Stopped (03/22/18 1910)   sodium chloride, hydrALAZINE, sodium chloride flush  Iron/TIBC/Ferritin/ %Sat    Component Value Date/Time   IRON 31 07/23/2017 0554   TIBC 176 (L) 07/23/2017 0554   FERRITIN 43 07/23/2017 0554   IRONPCTSAT 18 07/23/2017 0554    Exam: Gen extubated, alert, no distress Chest mild bibasilar rales, o/w clear RRR no MRG Abd soft ntnd no mass or ascites +bs Ext no LE or UE edema Neuro is alert, Ox 3 , nf L forearm AVF, R IJ temp cath   Home meds:  - amlodipine 10/ carvedilol 25 bid/ clonidine 0.1 bid/ furosemide 40 qd/ spironolactone 25 qd  - sod bicarbonate 1300 qd/ calcium acetate 1 ac  - aspirin 81  - oxycodone IR prn/ hydrocodone- acetaminophen qid prn   Dialysis: GO TTS  4h  250/1.5  53.5kg  3K/2.5Ca  Hep 1600  - mircera 100 q 2, last 11.21, last Hb 8.4  - calc po 0.5 ug tiw, pth 1698   CXR - diffuse severe alveolar infiltrates  bilat   Impression/ Plan: 1. Acute resp failure/ severe pulm edema - resolved, dry wt down to ~48kg.  2. ESRD on HD , started 3 wks ago. L AVF placed July 2019.  Will plan short HD today (holiday schedule), if AVF is functional now , will pull temp cath.   3. Malfunctioning AVF - sp balloon angioplasty to 60% midAVF stenosis this am 11/27 4. HTN - better, cont Rx 5. 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 Dispo - want to see if AVF working well for HD, if so then should be OK for dc from our perspective.     Kelly Splinter MD [employer] pager [phone]   03/24/2018, 12:10 PM   Recent Labs  Lab 03/22/18 0428 03/23/18 0451  NA 139 137  K 5.3* 3.8  CL 111 101  CO2 18* 21*  GLUCOSE 246* 89  BUN 72* 41*  CREATININE 6.15* 4.53*  CALCIUM 8.4* 9.2  PHOS  --  6.7*   No results for input(s): AST, ALT, ALKPHOS, BILITOT, PROT in the last 168 hours. Recent Labs  Lab  03/22/18 0428 03/23/18 0528  WBC 16.8* 7.6  HGB 10.1* 9.9*  HCT 37.7 33.3*  MCV 104.4* 96.8  PLT 325 264    ]

## 2018-03-24 NOTE — Op Note (Signed)
    Patient name: Stephanie Gross MRN: [id number] DOB: [birth year] Sex: female  03/24/2018 Pre-operative Diagnosis: End-stage renal disease Post-operative diagnosis:  Same Surgeon:  [name] Procedure Performed:  1.  Ultrasound-guided access, left cephalic vein  2.  Fistulogram  3.  Venoplasty, left cephalic vein (peripheral)  4.  Conscious sedation (15 minutes)   Indications: Patient is having difficulty with dialysis access.  She comes in today for fistulogram.  Procedure:  The patient was identified in the holding area and taken to room 8.  The patient was then placed supine on the table and prepped and draped in the usual sterile fashion.  A time out was called.  Conscious sedation was administered with use of IV fentanyl Versed under continuous physician and nurse monitoring.  Heart rate, blood pressure, and oxygen saturation will continue to monitor.  Ultrasound was used to evaluate the fistula.  The vein was patent and compressible.  A digital ultrasound image was acquired.  The fistula was then accessed under ultrasound guidance using a micropuncture needle.  An 018 wire was then asvanced without resistance and a micropuncture sheath was placed.  Contrast injections were then performed through the sheath.  Findings:  No central venous stenosis.  The arterial venous anastomosis is widely patent.  In the midportion of the fistula there was luminal narrowing of approximately 60%   Intervention: After the above images were acquired the decision was made to proceed with intervention.  Over an 035 wire, a 6 French sheath was placed.  I selected a 7 x 60 Mustang balloon and performed balloon angioplasty for 2 separate inflations of the narrowed area in the cephalic vein.  Residual stenosis was less than 10%.  Cannulation site was closed with suture.  Impression:  #1 52% left cephalic vein stenosis successfully treated with a 7 mm line.     Theotis Burrow, M.D. [facility] [place] Office: [phone] Pager:  [phone]

## 2018-03-25 ENCOUNTER — Inpatient Hospital Stay (HOSPITAL_COMMUNITY): Payer: Medicare Other

## 2018-03-25 LAB — COMPREHENSIVE METABOLIC PANEL
ALK PHOS: 60 U/L (ref 38–126)
ALT: 13 U/L (ref 0–44)
AST: 10 U/L — AB (ref 15–41)
Albumin: 2.7 g/dL — ABNORMAL LOW (ref 3.5–5.0)
Anion gap: 9 (ref 5–15)
BUN: 32 mg/dL — AB (ref 8–23)
CHLORIDE: 101 mmol/L (ref 98–111)
CO2: 26 mmol/L (ref 22–32)
CREATININE: 4.6 mg/dL — AB (ref 0.44–1.00)
Calcium: 9.2 mg/dL (ref 8.9–10.3)
GFR calc Af Amer: 10 mL/min — ABNORMAL LOW (ref >60)
GFR calc non Af Amer: 9 mL/min — ABNORMAL LOW (ref >60)
Glucose, Bld: 100 mg/dL — ABNORMAL HIGH (ref 70–99)
Potassium: 4 mmol/L (ref 3.5–5.1)
Sodium: 136 mmol/L (ref 135–145)
Total Bilirubin: 0.8 mg/dL (ref 0.3–1.2)
Total Protein: 6.5 g/dL (ref 6.5–8.1)

## 2018-03-25 LAB — CBC
HCT: 28.6 % — ABNORMAL LOW (ref 36.0–46.0)
HEMOGLOBIN: 8.6 g/dL — AB (ref 12.0–15.0)
MCH: 29 pg (ref 26.0–34.0)
MCHC: 30.1 g/dL (ref 30.0–36.0)
MCV: 96.3 fL (ref 80.0–100.0)
NRBC: 0 % (ref 0.0–0.2)
PLATELETS: 230 10*3/uL (ref 150–400)
RBC: 2.97 MIL/uL — AB (ref 3.87–5.11)
RDW: 14.6 % (ref 11.5–15.5)
WBC: 8.4 10*3/uL (ref 4.0–10.5)

## 2018-03-25 MED ORDER — VANCOMYCIN VARIABLE DOSE PER UNSTABLE RENAL FUNCTION (PHARMACIST DOSING): Status: DC

## 2018-03-25 NOTE — Progress Notes (Signed)
[facility] Progress Note  Subjective: unable to use AVF yesterday.  Had HD yest using temp cath.  Had temp spike 101 deg F yesterday.  No c/o today.   Vitals:   03/24/18 2124 03/24/18 2300 03/25/18 0049 03/25/18 0526  BP: (!) 134/55  (!) 126/58 (!) 100/57  Pulse:   86 82  Resp:   20 20  Temp:  (!) 102.5 F (39.2 C) (!) 101.5 F (38.6 C) 99.4 F (37.4 C)  TempSrc:  Oral Oral Oral  SpO2:   96% 93%  Weight:    47 kg  Height:        Inpatient medications: . amLODipine  10 mg Oral Daily  . calcitRIOL  0.5 mcg Oral Q T,Th,Sa-HD  . calcium acetate  667 mg Oral TID WC  . carvedilol  25 mg Oral BID WC  . cloNIDine  0.1 mg Oral BID  . heparin  5,000 Units Subcutaneous Q8H   . sodium chloride 250 mL (03/24/18 2315)   sodium chloride  Iron/TIBC/Ferritin/ %Sat    Component Value Date/Time   IRON 31 07/23/2017 0554   TIBC 176 (L) 07/23/2017 0554   FERRITIN 43 07/23/2017 0554   IRONPCTSAT 18 07/23/2017 0554    Exam: Gen extubated, alert, no distress Chest mild bibasilar rales, o/w clear RRR no MRG Abd soft ntnd no mass or ascites +bs Ext no LE or UE edema Neuro is alert, Ox 3 , nf L forearm AVF, R IJ temp cath   Home meds:  - amlodipine 10/ carvedilol 25 bid/ clonidine 0.1 bid/ furosemide 40 qd/ spironolactone 25 qd  - sod bicarbonate 1300 qd/ calcium acetate 1 ac  - aspirin 81  - oxycodone IR prn/ hydrocodone- acetaminophen qid prn   Dialysis: GO TTS  4h  250/1.5  53.5kg  3K/2.5Ca  Hep 1600  - mircera 100 q 2, last 11.21, last Hb 8.4  - calc po 0.5 ug tiw, pth 1698   CXR - diffuse severe alveolar infiltrates bilat   Impression/ Plan: 1. Acute resp failure/ severe pulm edema - resolved, dry wt down to ~48kg.  2. ESRD on HD , started 3 wks ago. L AVF placed July 2019 now not functioning despite fistulogram/ angioplasty.  For [facility] tomorrow and rest AVF per [facility].  Will dc temp cath now w/ fever last night.  Next HD Sat , may do short HD tomorrow if  needed after [facility] placed.  3. Malfunctioning AVF - sp balloon angioplasty to 60% midAVF stenosis 11/27 4. HTN - controlled  cont Rx 5. 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, phoslo   Kelly Splinter MD [facility] pager [phone]   03/25/2018, 11:00 AM   Recent Labs  Lab 03/23/18 0451 03/24/18 1300  NA 137 138  K 3.8 3.9  CL 101 100  CO2 21* 26  GLUCOSE 89 113*  BUN 41* 40*  CREATININE 4.53* 4.78*  CALCIUM 9.2 9.2  PHOS 6.7* 6.1*  ALBUMIN  --  2.8*   No results for input(s): AST, ALT, ALKPHOS, BILITOT, PROT in the last 168 hours. Recent Labs  Lab 03/24/18 1300 03/25/18 0743  WBC 7.7 8.4  HGB 9.2* 8.6*  HCT 31.6* 28.6*  MCV 97.8 96.3  PLT 265 230    ]

## 2018-03-25 NOTE — Progress Notes (Signed)
IV team at bedside to remove temporary HD catheter. As IV team RN was trying to cut sutures, patient begins to scream out in pain. Patient then refused to continue with procedure. Nephrologist notified. Per nephrology leave HD catheter in place for now.

## 2018-03-25 NOTE — Progress Notes (Signed)
   Plan for exchange of right IJ catheter in OR tomorrow.  I discussed with patient she agrees to proceed.  N.p.o. past midnight.  Quante Pettry C. Donzetta Matters, MD [facility] [place] Office: [phone] Pager: [phone]

## 2018-03-25 NOTE — Progress Notes (Signed)
Paged by Dr. Vance Gather. Patient follows with IMTS. We will assume care on 11/29 at [place] and [facility] participating in patient's care.

## 2018-03-25 NOTE — Progress Notes (Signed)
Pharmacy Antibiotic Note  Stephanie Gross is a 72 y.o. female admitted on 03/22/2018 with suspicion for CLABSI.  Pharmacy has been consulted for vancomycin dosing. Planning for exchange of R IJ catheter in OR tomorrow per Vascular. ESRD, recent start on HD TTS - last HD 11/27, next planned for 11/30 but may do short HD 11/28 if needed per Renal after TDC placed.  Vancomycin 1g IV loading dose already received post-HD on 11/27 at 2317  Plan: Continue vancomycin 500mg  IV qHD (not yet entered) Monitor clinical progress, c/s, abx plan/LOT Pre-HD vancomycin level as indicated F/u HD schedule/tolerance inpatient to enter vancomycin maintenance doses - next HD either 11/29 or 11/30 per Renal   Height: 5\' 5"  (165.1 cm) Weight: 103 lb 9.6 oz (47 kg) IBW/kg (Calculated) : 57  Temp (24hrs), Avg:100.5 F (38.1 C), Min:98.4 F (36.9 C), Max:102.5 F (39.2 C)  Recent Labs  Lab 03/22/18 0358 03/22/18 0428 03/23/18 0451 03/23/18 0528 03/24/18 1300 03/25/18 0743  WBC  --  16.8*  --  7.6 7.7 8.4  CREATININE 6.20* 6.15* 4.53*  --  4.78* 4.60*    Estimated Creatinine Clearance: 8.4 mL/min (A) (by C-G formula based on SCr of 4.6 mg/dL (H)).    Allergies  Allergen Reactions  . Benazepril-Hydrochlorothiazide Other (See Comments)    UNKNOWN. Ok with lisinopril  . Chantix [Varenicline] Other (See Comments)    SUICIDAL IDEATION  . Codeine Nausea And Vomiting  . Methylprednisolone Other (See Comments)    UNKNOWN  . Statins Other (See Comments)    "walking into a hole", heavy legs, feels like she is going to fall    Antimicrobials this admission: 11/28 vanc >>   Dose adjustments this admission:  Microbiology results: 11/27 BCx: ngtd 11/25 BCx: ngtd  11/25 MRSA PCR: neg  Elicia Lamp, PharmD, BCPS Please check AMION for all [place] contact numbers Clinical Pharmacist 03/25/2018 3:48 PM

## 2018-03-25 NOTE — Progress Notes (Signed)
PROGRESS NOTE  Stephanie Gross  ZHG:[id number] DOB: 10-26-1945 DOA: 03/22/2018 PCP: Velna Ochs, MD   Brief Narrative: Stephanie Gross is a 72 y.o. female with a history of ESRD, COPD, CVA, and HTN who presented with shortness of breath due to pulmonary edema in the setting of missing HD treatments due to inability to cannulate AVF. She was initially unresponsive, intubated and admitted to ICU. Temporary HD catheter was placed to facilitate dialysis with steady improvement in respiratory status and mentation, ultimately extubated and weaned to room air. Fistulogram and balloon angioplasty was performed though we remain unable to use AVF. Transferred to the floor on hospitalist service 11/28. Developed fever to 102.24F for which vancomycin was administered after blood cultures drawn. HD cath is empirically removed with plans to place tunneled catheter 11/29.   Assessment & Plan: Active Problems:   Acute hypoxemic respiratory failure (HCC)   End-stage renal disease on hemodialysis ([place])  Acute hypoxemic respiratory failure: Due to AMS and pulmonary edema having missed HD, improved. Pneumonia not ruled out. Extubated 11/26.  - Continue to monitor respiratory status.   ESRD: Recent start, outpatient TTS schedule. Initially with hyperkalemia and NAGMA both resolved.  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 Trula Slade 11/27 and with +thrill, though still unable to cannulate for HD 11/27.  - Per vascular surgery - TDC to be placed 11/29  Fever: High suspicion for line infection. No leukocytosis and remains hemodynamically stable for telemetry floor. - DC IJ and monitor blood cultures. Initial Cx's NGTD from 11/25.  Hypertension with hypertensive urgency:  - Improved with volume removal, now back on home medications.   Anemia of chronic disease:  - Monitor for acute blood loss.  - Getting ESA, last 11/21  DVT  prophylaxis: Heparin Code Status: Full Family Communication: None at bedside this AM Disposition Plan: Uncertain.   Consultants:   Nephrology  PCCM  Vascular surgery  Procedures:   ETT 11/25 - 11/26  Left AV graft interrogation 03/24/2018 with stenosis repaired  Antimicrobials:  Vancomycin 11/27   Subjective: Absolutely no complaints this morning. Always feels cold and did feel cold last night, denies chills, rigors, feverish feeling. Coughing white sputum for the past week or so, otherwise no change, no dyspnea or chest pain, no abdominal pain N/V/D. Does make small amount of urine, no changes.   Objective: Vitals:   03/24/18 2300 03/25/18 0049 03/25/18 0526 03/25/18 1218  BP:  (!) 126/58 (!) 100/57 (!) 120/46  Pulse:  86 82 83  Resp:  20 20 20   Temp: (!) 102.5 F (39.2 C) (!) 101.5 F (38.6 C) 99.4 F (37.4 C) 98.4 F (36.9 C)  TempSrc: Oral Oral Oral Oral  SpO2:  96% 93% 97%  Weight:   47 kg   Height:       Gen: Chronically ill-appearing, pleasant female in no distress Pulm: Non-labored breathing. Clear to auscultation bilaterally.  CV: Regular rate and rhythm. No murmur, rub, or gallop. No JVD, no pedal edema. GI: Abdomen soft, non-tender, non-distended, with normoactive bowel sounds. No organomegaly or masses felt. Ext: Warm, no deformities. Left upper arm AVF with + thrill. Right IJ cath without tenderness, discharge, erythema.  Skin: No rashes, lesions or ulcers Neuro: Alert and oriented. No focal neurological deficits. Psych: Judgement and insight appear normal. Mood & affect appropriate.   Data Reviewed: I have personally reviewed following labs and imaging studies  CBC: Recent Labs  Lab 03/22/18 0358 03/22/18  9604 03/23/18 0528 03/24/18 1300 03/25/18 0743  WBC  --  16.8* 7.6 7.7 8.4  HGB 11.2* 10.1* 9.9* 9.2* 8.6*  HCT 33.0* 37.7 33.3* 31.6* 28.6*  MCV  --  104.4* 96.8 97.8 96.3  PLT  --  325 264 265 540   Basic Metabolic Panel: Recent  Labs  Lab 03/22/18 0358 03/22/18 0428 03/23/18 0451 03/24/18 1300 03/25/18 0743  NA 140 139 137 138 136  K 4.8 5.3* 3.8 3.9 4.0  CL 114* 111 101 100 101  CO2  --  18* 21* 26 26  GLUCOSE 217* 246* 89 113* 100*  BUN 81* 72* 41* 40* 32*  CREATININE 6.20* 6.15* 4.53* 4.78* 4.60*  CALCIUM  --  8.4* 9.2 9.2 9.2  MG  --   --  1.9  --   --   PHOS  --   --  6.7* 6.1*  --    GFR: Estimated Creatinine Clearance: 8.4 mL/min (A) (by C-G formula based on SCr of 4.6 mg/dL (H)). Liver Function Tests: Recent Labs  Lab 03/24/18 1300 03/25/18 0743  AST  --  10*  ALT  --  13  ALKPHOS  --  60  BILITOT  --  0.8  PROT  --  6.5  ALBUMIN 2.8* 2.7*   No results for input(s): LIPASE, AMYLASE in the last 168 hours. No results for input(s): AMMONIA in the last 168 hours. Coagulation Profile: No results for input(s): INR, PROTIME in the last 168 hours. Cardiac Enzymes: Recent Labs  Lab 03/22/18 0622  TROPONINI 0.34*   BNP (last 3 results) No results for input(s): PROBNP in the last 8760 hours. HbA1C: No results for input(s): HGBA1C in the last 72 hours. CBG: Recent Labs  Lab 03/23/18 1704 03/23/18 2038 03/24/18 0037 03/24/18 0805 03/24/18 1107  GLUCAP 80 146* 121* 86 232*   Lipid Profile: Recent Labs    03/23/18 0451  TRIG 130   Thyroid Function Tests: No results for input(s): TSH, T4TOTAL, FREET4, T3FREE, THYROIDAB in the last 72 hours. Anemia Panel: No results for input(s): VITAMINB12, FOLATE, FERRITIN, TIBC, IRON, RETICCTPCT in the last 72 hours. Urine analysis:    Component Value Date/Time   COLORURINE YELLOW 03/22/2018 0430   APPEARANCEUR CLEAR 03/22/2018 0430   LABSPEC 1.013 03/22/2018 0430   PHURINE 7.0 03/22/2018 0430   GLUCOSEU 150 (A) 03/22/2018 0430   HGBUR NEGATIVE 03/22/2018 0430   BILIRUBINUR NEGATIVE 03/22/2018 0430   KETONESUR NEGATIVE 03/22/2018 0430   PROTEINUR >=300 (A) 03/22/2018 0430   UROBILINOGEN 0.2 11/07/2013 2152   NITRITE NEGATIVE 03/22/2018  0430   LEUKOCYTESUR NEGATIVE 03/22/2018 0430   Recent Results (from the past 240 hour(s))  Culture, blood (routine x 2)     Status: None (Preliminary result)   Collection Time: 03/22/18  7:56 AM  Result Value Ref Range Status   Specimen Description BLOOD RIGHT HAND  Final   Special Requests   Final    BOTTLES DRAWN AEROBIC ONLY Blood Culture results may not be optimal due to an inadequate volume of blood received in culture bottles   Culture   Final    NO GROWTH 3 DAYS Performed at [facility], [place] [address] [place], [place] [address]    Report Status PENDING  Incomplete  MRSA PCR Screening     Status: None   Collection Time: 03/22/18  8:57 AM  Result Value Ref Range Status   MRSA by PCR NEGATIVE NEGATIVE Final    Comment:  The GeneXpert MRSA Assay (FDA approved for NASAL specimens only), is one component of a comprehensive MRSA colonization surveillance program. It is not intended to diagnose MRSA infection nor to guide or monitor treatment for MRSA infections. Performed at [facility], [place] [address] [place], [place] [address]   Culture, blood (routine x 2)     Status: None (Preliminary result)   Collection Time: 03/22/18 10:13 AM  Result Value Ref Range Status   Specimen Description BLOOD FOOT  Final   Special Requests   Final    BOTTLES DRAWN AEROBIC ONLY Blood Culture adequate volume   Culture   Final    NO GROWTH 3 DAYS Performed at [facility], [place] [address], [place], [place] [address]    Report Status PENDING  Incomplete  Culture, blood (Routine X 2) w Reflex to ID Panel     Status: None (Preliminary result)   Collection Time: 03/24/18 10:58 PM  Result Value Ref Range Status   Specimen Description BLOOD RIGHT IJ  Final   Special Requests   Final    BOTTLES DRAWN AEROBIC AND ANAEROBIC Blood Culture adequate volume   Culture   Final    NO GROWTH < 12 HOURS Performed at [facility], [place] [address], [place], [place]  [address]    Report Status PENDING  Incomplete      Radiology Studies: No results found.  Scheduled Meds: . amLODipine  10 mg Oral Daily  . calcitRIOL  0.5 mcg Oral Q T,Th,Sa-HD  . calcium acetate  667 mg Oral TID WC  . carvedilol  25 mg Oral BID WC  . cloNIDine  0.1 mg Oral BID  . heparin  5,000 Units Subcutaneous Q8H   Continuous Infusions: . sodium chloride 250 mL (03/24/18 2315)     LOS: 3 days   Time spent: 35 minutes.  Patrecia Pour, MD [facility] [email or web address] Password [email or web address] 03/25/2018, 3:12 PM

## 2018-03-25 NOTE — Plan of Care (Signed)
  Problem: Education: Goal: Knowledge of General Education information will improve Description Including pain rating scale, medication(s)/side effects and non-pharmacologic comfort measures Outcome: Progressing   Problem: Clinical Measurements: Goal: Ability to maintain clinical measurements within normal limits will improve Outcome: Progressing Goal: Will remain free from infection Outcome: Progressing Goal: Cardiovascular complication will be avoided Outcome: Progressing   Problem: Activity: Goal: Risk for activity intolerance will decrease Outcome: Progressing   Problem: Nutrition: Goal: Adequate nutrition will be maintained Outcome: Progressing   Problem: Elimination: Goal: Will not experience complications related to bowel motility Outcome: Progressing Goal: Will not experience complications related to urinary retention Outcome: Progressing   Problem: Pain Managment: Goal: General experience of comfort will improve Outcome: Progressing   Problem: Safety: Goal: Ability to remain free from injury will improve Outcome: Progressing   Problem: Skin Integrity: Goal: Risk for impaired skin integrity will decrease Outcome: Progressing   Problem: Health Behavior/Discharge Planning: Goal: Ability to manage health-related needs will improve Outcome: Not Met (add Reason)   Problem: Clinical Measurements: Goal: Diagnostic test results will improve Outcome: Not Met (add Reason) Goal: Respiratory complications will improve Outcome: Not Met (add Reason)   Problem: Coping: Goal: Level of anxiety will decrease Outcome: Not Met (add Reason)

## 2018-03-26 ENCOUNTER — Inpatient Hospital Stay (HOSPITAL_COMMUNITY): Payer: Medicare Other | Admitting: Anesthesiology

## 2018-03-26 ENCOUNTER — Inpatient Hospital Stay (HOSPITAL_COMMUNITY): Payer: Medicare Other

## 2018-03-26 ENCOUNTER — Encounter (HOSPITAL_COMMUNITY)
Admission: EM | Disposition: A | Discharge status: Home / Self Care | Payer: Self-pay | Source: Home / Self Care | Attending: Pulmonary Disease

## 2018-03-26 ENCOUNTER — Other Ambulatory Visit: Payer: Self-pay

## 2018-03-26 DIAGNOSIS — D638 Anemia in other chronic diseases classified elsewhere: Secondary | ICD-10-CM

## 2018-03-26 DIAGNOSIS — Z79899 Other long term (current) drug therapy: Secondary | ICD-10-CM

## 2018-03-26 DIAGNOSIS — Z8673 Personal history of transient ischemic attack (TIA), and cerebral infarction without residual deficits: Secondary | ICD-10-CM

## 2018-03-26 DIAGNOSIS — N185 Chronic kidney disease, stage 5: Secondary | ICD-10-CM

## 2018-03-26 DIAGNOSIS — I120 Hypertensive chronic kidney disease with stage 5 chronic kidney disease or end stage renal disease: Secondary | ICD-10-CM

## 2018-03-26 DIAGNOSIS — J449 Chronic obstructive pulmonary disease, unspecified: Secondary | ICD-10-CM

## 2018-03-26 DIAGNOSIS — R509 Fever, unspecified: Secondary | ICD-10-CM

## 2018-03-26 DIAGNOSIS — T82898A Other specified complication of vascular prosthetic devices, implants and grafts, initial encounter: Secondary | ICD-10-CM

## 2018-03-26 HISTORY — PX: INSERTION OF DIALYSIS CATHETER: SHX1324

## 2018-03-26 HISTORY — PX: REMOVAL OF A DIALYSIS CATHETER: SHX6053

## 2018-03-26 LAB — CBC
HEMATOCRIT: 27 % — AB (ref 36.0–46.0)
HEMOGLOBIN: 8 g/dL — AB (ref 12.0–15.0)
MCH: 28.4 pg (ref 26.0–34.0)
MCHC: 29.6 g/dL — ABNORMAL LOW (ref 30.0–36.0)
MCV: 95.7 fL (ref 80.0–100.0)
Platelets: 229 10*3/uL (ref 150–400)
RBC: 2.82 MIL/uL — ABNORMAL LOW (ref 3.87–5.11)
RDW: 14.3 % (ref 11.5–15.5)
WBC: 6.5 10*3/uL (ref 4.0–10.5)
nRBC: 0 % (ref 0.0–0.2)

## 2018-03-26 LAB — BASIC METABOLIC PANEL
Anion gap: 12 (ref 5–15)
BUN: 51 mg/dL — ABNORMAL HIGH (ref 8–23)
CO2: 23 mmol/L (ref 22–32)
Calcium: 9.2 mg/dL (ref 8.9–10.3)
Chloride: 100 mmol/L (ref 98–111)
Creatinine, Ser: 6.31 mg/dL — ABNORMAL HIGH (ref 0.44–1.00)
GFR calc Af Amer: 7 mL/min — ABNORMAL LOW (ref >60)
GFR calc non Af Amer: 6 mL/min — ABNORMAL LOW (ref >60)
Glucose, Bld: 95 mg/dL (ref 70–99)
Potassium: 4 mmol/L (ref 3.5–5.1)
Sodium: 135 mmol/L (ref 135–145)

## 2018-03-26 LAB — TRIGLYCERIDES: Triglycerides: 82 mg/dL (ref <150)

## 2018-03-26 LAB — GLUCOSE, CAPILLARY: Glucose-Capillary: 93 mg/dL (ref 70–99)

## 2018-03-26 SURGERY — INSERTION OF DIALYSIS CATHETER
Anesthesia: Monitor Anesthesia Care | Site: Neck | Laterality: Right

## 2018-03-26 MED ORDER — SODIUM CHLORIDE 0.9 % IV SOLN
INTRAVENOUS | Status: DC | PRN
  Administered 2018-03-26: 08:00:00

## 2018-03-26 MED ORDER — 0.9 % SODIUM CHLORIDE (POUR BTL) OPTIME
TOPICAL | Status: DC | PRN
  Administered 2018-03-26: 1000 mL

## 2018-03-26 MED ORDER — LIDOCAINE 2% (20 MG/ML) 5 ML SYRINGE
INTRAMUSCULAR | Status: AC
  Filled 2018-03-26: qty 5

## 2018-03-26 MED ORDER — LIDOCAINE-EPINEPHRINE (PF) 1 %-1:200000 IJ SOLN
INTRAMUSCULAR | Status: DC | PRN
  Administered 2018-03-26: 30 mL

## 2018-03-26 MED ORDER — LIDOCAINE 2% (20 MG/ML) 5 ML SYRINGE
INTRAMUSCULAR | Status: DC | PRN
  Administered 2018-03-26: 50 mg via INTRAVENOUS

## 2018-03-26 MED ORDER — SODIUM CHLORIDE 0.9 % IV SOLN
INTRAVENOUS | Status: AC
  Filled 2018-03-26: qty 1.2

## 2018-03-26 MED ORDER — SODIUM CHLORIDE 0.9% FLUSH
10.0000 mL | INTRAVENOUS | Status: DC | PRN
  Administered 2018-03-26: 20 mL

## 2018-03-26 MED ORDER — SENNOSIDES-DOCUSATE SODIUM 8.6-50 MG PO TABS
2.0000 | ORAL_TABLET | Freq: Every day | ORAL | Status: DC
  Administered 2018-03-26 – 2018-03-27 (×2): 2 via ORAL
  Filled 2018-03-26 (×2): qty 2

## 2018-03-26 MED ORDER — AMLODIPINE BESYLATE 2.5 MG PO TABS
5.0000 mg | ORAL_TABLET | Freq: Every day | ORAL | Status: DC
  Administered 2018-03-27: 5 mg via ORAL
  Filled 2018-03-26: qty 2

## 2018-03-26 MED ORDER — CHLORHEXIDINE GLUCONATE CLOTH 2 % EX PADS
6.0000 | MEDICATED_PAD | Freq: Every day | CUTANEOUS | Status: DC
  Administered 2018-03-27: 6 via TOPICAL

## 2018-03-26 MED ORDER — HEPARIN SODIUM (PORCINE) 1000 UNIT/ML IJ SOLN
INTRAMUSCULAR | Status: DC | PRN
  Administered 2018-03-26: 1000 [IU]

## 2018-03-26 MED ORDER — HEPARIN SODIUM (PORCINE) 1000 UNIT/ML IJ SOLN
INTRAMUSCULAR | Status: AC
  Filled 2018-03-26: qty 1

## 2018-03-26 MED ORDER — FENTANYL CITRATE (PF) 250 MCG/5ML IJ SOLN
INTRAMUSCULAR | Status: AC
  Filled 2018-03-26: qty 5

## 2018-03-26 MED ORDER — PROPOFOL 10 MG/ML IV BOLUS
INTRAVENOUS | Status: AC
  Filled 2018-03-26: qty 20

## 2018-03-26 MED ORDER — PROPOFOL 1000 MG/100ML IV EMUL
INTRAVENOUS | Status: AC
  Filled 2018-03-26: qty 100

## 2018-03-26 MED ORDER — LIDOCAINE HCL (PF) 1 % IJ SOLN
INTRAMUSCULAR | Status: AC
  Filled 2018-03-26: qty 30

## 2018-03-26 MED ORDER — CEFAZOLIN SODIUM-DEXTROSE 2-3 GM-%(50ML) IV SOLR
INTRAVENOUS | Status: DC | PRN
  Administered 2018-03-26: 2 g via INTRAVENOUS

## 2018-03-26 MED ORDER — PROPOFOL 500 MG/50ML IV EMUL
INTRAVENOUS | Status: DC | PRN
  Administered 2018-03-26: 50 ug/kg/min via INTRAVENOUS

## 2018-03-26 MED ORDER — CEFAZOLIN SODIUM-DEXTROSE 1-4 GM/50ML-% IV SOLN
INTRAVENOUS | Status: AC
  Filled 2018-03-26: qty 50

## 2018-03-26 MED ORDER — FENTANYL CITRATE (PF) 250 MCG/5ML IJ SOLN
INTRAMUSCULAR | Status: DC | PRN
  Administered 2018-03-26: 50 ug via INTRAVENOUS

## 2018-03-26 MED ORDER — LIDOCAINE-EPINEPHRINE (PF) 1 %-1:200000 IJ SOLN
INTRAMUSCULAR | Status: AC
  Filled 2018-03-26: qty 30

## 2018-03-26 MED ORDER — PROPOFOL 10 MG/ML IV BOLUS
INTRAVENOUS | Status: DC | PRN
  Administered 2018-03-26: 10 mg via INTRAVENOUS
  Administered 2018-03-26: 15 mg via INTRAVENOUS
  Administered 2018-03-26 (×2): 10 mg via INTRAVENOUS

## 2018-03-26 SURGICAL SUPPLY — 42 items
BAG DECANTER FOR FLEXI CONT (MISCELLANEOUS) ×4 IMPLANT
BIOPATCH RED 1 DISK 7.0 (GAUZE/BANDAGES/DRESSINGS) ×3 IMPLANT
BIOPATCH RED 1IN DISK 7.0MM (GAUZE/BANDAGES/DRESSINGS) ×1
CATH PALINDROME RT-P 15FX19CM (CATHETERS) ×4 IMPLANT
COVER DOME SNAP 22 D (MISCELLANEOUS) ×4 IMPLANT
COVER PROBE W GEL 5X96 (DRAPES) ×4 IMPLANT
COVER SURGICAL LIGHT HANDLE (MISCELLANEOUS) ×4 IMPLANT
COVER WAND RF STERILE (DRAPES) ×4 IMPLANT
DERMABOND ADVANCED (GAUZE/BANDAGES/DRESSINGS) ×2
DERMABOND ADVANCED .7 DNX12 (GAUZE/BANDAGES/DRESSINGS) ×2 IMPLANT
DRAPE C-ARM 42X72 X-RAY (DRAPES) ×4 IMPLANT
DRAPE CHEST BREAST 15X10 FENES (DRAPES) ×4 IMPLANT
GAUZE 4X4 16PLY RFD (DISPOSABLE) ×4 IMPLANT
GLOVE BIO SURGEON STRL SZ7.5 (GLOVE) ×4 IMPLANT
GLOVE BIOGEL PI IND STRL 6.5 (GLOVE) ×2 IMPLANT
GLOVE BIOGEL PI IND STRL 7.5 (GLOVE) ×4 IMPLANT
GLOVE BIOGEL PI INDICATOR 6.5 (GLOVE) ×2
GLOVE BIOGEL PI INDICATOR 7.5 (GLOVE) ×4
GLOVE ECLIPSE 7.5 STRL STRAW (GLOVE) ×4 IMPLANT
GLOVE SURG SS PI 6.5 STRL IVOR (GLOVE) ×4 IMPLANT
GOWN STRL REUS W/ TWL LRG LVL3 (GOWN DISPOSABLE) ×2 IMPLANT
GOWN STRL REUS W/ TWL XL LVL3 (GOWN DISPOSABLE) ×4 IMPLANT
GOWN STRL REUS W/TWL LRG LVL3 (GOWN DISPOSABLE) ×2
GOWN STRL REUS W/TWL XL LVL3 (GOWN DISPOSABLE) ×4
KIT BASIN OR (CUSTOM PROCEDURE TRAY) ×4 IMPLANT
KIT TURNOVER KIT B (KITS) ×4 IMPLANT
NEEDLE 18GX1X1/2 (RX/OR ONLY) (NEEDLE) ×4 IMPLANT
NEEDLE HYPO 25GX1X1/2 BEV (NEEDLE) ×4 IMPLANT
NS IRRIG 1000ML POUR BTL (IV SOLUTION) ×4 IMPLANT
PACK SURGICAL SETUP 50X90 (CUSTOM PROCEDURE TRAY) ×4 IMPLANT
PAD ARMBOARD 7.5X6 YLW CONV (MISCELLANEOUS) ×8 IMPLANT
SOAP 2 % CHG 4 OZ (WOUND CARE) ×4 IMPLANT
SUT ETHILON 3 0 PS 1 (SUTURE) ×4 IMPLANT
SUT MNCRL AB 4-0 PS2 18 (SUTURE) ×4 IMPLANT
SYR 10ML LL (SYRINGE) ×4 IMPLANT
SYR 20CC LL (SYRINGE) ×8 IMPLANT
SYR 5ML LL (SYRINGE) ×4 IMPLANT
SYR CONTROL 10ML LL (SYRINGE) ×4 IMPLANT
TOWEL GREEN STERILE (TOWEL DISPOSABLE) ×4 IMPLANT
TOWEL GREEN STERILE FF (TOWEL DISPOSABLE) ×4 IMPLANT
TOWEL OR 17X26 4PK STRL BLUE (TOWEL DISPOSABLE) ×4 IMPLANT
WATER STERILE IRR 1000ML POUR (IV SOLUTION) ×4 IMPLANT

## 2018-03-26 NOTE — Progress Notes (Addendum)
   Transfer Summary:  Assume care from [facility] this morning.  72 year old female with ESRD, COPD, CVA, hypertension who presented on 11/25 after waking up acutely short of breath and diaphoretic.  She was diagnosed with pulmonary edema in the setting of missing HD treatments due to inability to cannulate her left AV fistula.  On presentation she was unresponsive, intubated and admitted to the ICU.  Temporary HD catheter was placed and her clinical status improved after successful dialysis.  On 11/26 she was successfully extubated.  Fistulogram and balloon angioplasty of the left AVF was performed without success.  She developed fever to 102.5 F on 11/27 for which blood cultures were drawn and vancomycin was started.  Planning for tunneled catheter placement and HD cath removal on 11/29.  Subjective: NAOEN. Feeling well this morning. Just came back from tunneled HD cath placement. Denies sob, chest pain. Hasn't had a BM since admission.   Objective:  Vital signs in last 24 hours: Vitals:   03/26/18 0510 03/26/18 0817 03/26/18 0830 03/26/18 0837  BP:  (!) 96/48 (!) 122/50   Pulse:   73 71  Resp:  14 15 12   Temp:  98.7 F (37.1 C)    TempSrc:      SpO2:  98% 100% 99%  Weight: 47.5 kg     Height:        Gen: laying in bed, NAD Cardiac: RRR, no m/r/g Pulm: CTAB Abd: soft, NT, ND Ext: no LEE, warm. Left AV fistula +thrill. Right IJ tunneled catheter covered with bandages which are clean and dry. Dermabond over right IJ neck incision superior to new tunneled cath, minimal discharge, no swelling or pain.   Assessment/Plan:  Active Problems:   Acute hypoxemic respiratory failure (HCC)   End-stage renal disease on hemodialysis ([place])  1.  Acute hypoxemic respiratory failure: Improved. 2/2 missed HD sessions and pulmonary edema. Extubated 11/26. Oxygenating well on room air. - PT/OT eval  2.  ESRD: Outpatient TTS schedule.  Potassium 4.0 this morning.  Getting tunneled cath today and removing  temporary HD catheter.  aVF 60% stenosis status post balloon angioplasty on 11/27 and with positive thrill but still unable to cannulate.  - Planning for HD today using TDC, may do another HD tomorrow. - f/u nephrology recs - hoping left AV fistula will start working after the arm has rested  3.  Fever: Most likely source is temporary HD catheter which was removed today.  She has been afebrile for 36 hours.  No leukocytosis.  Blood cultures from right IJ and right antecubital are no growth 2 and 1 days respectively.  - Discontinue vancomycin  -Follow-up blood cultures  4.  Hypertension: Controlled.  Continue home medications - amlodipine 10mg  qd, coreg 25mg  bid, clonidine 0.1mg  bid  5.  Anemia of chronic disease: Hemoglobin 10 on admission now down to 8. Continue to monitor. Getting ESA, last dose 11/21  Dispo: Anticipated discharge in approximately 1 day(s).   Isabelle Course, MD 03/26/2018, 9:20 AM Pager: [phone]

## 2018-03-26 NOTE — Op Note (Signed)
    Patient name: Stephanie Gross MRN: [id number] DOB: 1946-01-11 Sex: female  03/26/2018 Pre-operative Diagnosis: End-stage renal disease Post-operative diagnosis:  Same Surgeon:  Erlene Quan C. Donzetta Matters, MD Procedure Performed: Right IJ 19 cm tunneled dialysis catheter placement with ultrasound guidance  Indications: 72 year old female with a temporary catheter has undergone balloon Angioplasty of left arm fistula.  She now presents for tunneled dialysis catheter placement.  Findings: Existing catheter in the right IJ was few centimeters higher for use for tunneled catheter.  The IJ itself was quite sclerotic after cannulation we are able to place a 19 cm catheter to the SVC atrial junction.   Procedure:  The patient was identified in the holding area and taken to the operating room where she is placed supine operative table MAC anesthesia was induced.  She was sterilely prepped and draped in the bilateral neck and chest areas and antibiotics were administered and a timeout was called.  Given that the existing catheter appeared to [facility] used for placing a tunnel catheter we then used ultrasound to identify the right IJ.  It was quite sclerotic if we are able to cannulate this with direct ultrasound guidance with a 18-gauge needle.  After this I placed the wire into the right atrium under fluoroscopic guidance.  I then remove the existing catheter.  A counterincision was made in 19 cm catheter was tunneled while pressure was held on the previous catheter site.  We then serially dilated the wire tract placed the introducer sheath.  Catheter was placed to the SVC atrial junction.  It was assembled flushed with heparinized saline locked with 1.5 cc of concentrated heparin.  It was affixed to the skin with 3-0 nylon suture and the neck incision was closed with 4 Monocryl.  Dermabond was placed to both sides.  Sterile dressing was placed overlying this.  She tolerated procedure without immediate  complication.  EBL 10 cc   Brandon C. Donzetta Matters, MD [facility] Office: [phone] Pager: [phone]

## 2018-03-26 NOTE — Progress Notes (Signed)
Internal Medicine Attending:   I saw and examined the patient. I reviewed the resident's note and I agree with the resident's findings and plan as documented in the resident's note.  In brief, patient is 73 year old female with a past medical history of ESRD on hemodialysis, COPD, CVA, hypertension who presented on 03/22/2018 with acute onset of shortness of breath and diaphoresis.  She was diagnosed with pulmonary edema in the setting missed hemodialysis treatments due to inability to cannulate her left AV fistula.  She was initially admitted to the ICU and was intubated.  Her clinical status improved with hemodialysis after temporary HD catheter was placed.  He was successfully extubated on March 23, 2018.  On 11/27 she developed a fever up to 102.5 F which was thought to be secondary to line infection.  Blood cultures were drawn which have been negative to date.  Patient was started on vancomycin.  She is now status post line removal with tunneled dialysis catheter placement today.  Patient feels well today with no new complaints.  She states her shortness of breath has improved.  She denies any pain currently.  The source of her fevers was likely an infected temporary HD cath.  However, cultures have remained negative to date.  We will continue to monitor.  Will discontinue vancomycin for now.  Continue with hemodialysis per nephrology.  We will follow PT/OT evaluation.  If patient continues to improve I suspect that she may be stable for discharge in the next 24 to 48 hours.

## 2018-03-26 NOTE — Consult Note (Addendum)
   [facility] CM Inpatient Consult   03/26/2018  Stephanie Gross 1945/12/02 [id number]  Follow up:  Patient screened for potential [place] Management needs and with a high risk score for unplanned readmissions.  Patient with a previous history with [place] Management for EMMI calls out reach but not able to maintain contact.   Patient just returning from HD catheter placement with plans for HD tomorrow.  Patient is in the [facility] [place] of the [place] Management services under patient's [employer] plan. Will follow for progress and needs.    Please place a [facility] Care Management consult or for questions contact:   Natividad Brood, RN BSN [facility] Liaison  [phone] business mobile phone Toll free office [phone]  11:10 Spoke with patient at the bedside, she appears tired.  Patient confirms her phone number and states, "this is the only number I have and I only sometimes return voice mails depending on what's going on.  I have dialysis on Tuesday, Wednesday and Saturdays at [facility]."  Patient verbalizes no needs at this time.  "I just have a lot of thinking and decisions I need to make right now." Currently, politely declines services at this time.  Natividad Brood, RN BSN [facility] Liaison  [phone] business mobile phone Toll free office [phone]

## 2018-03-26 NOTE — Anesthesia Preprocedure Evaluation (Signed)
Anesthesia Evaluation  Patient identified by MRN, date of birth, ID band Patient awake    Reviewed: Allergy & Precautions, NPO status , Patient's Chart, lab work & pertinent test results  Airway Mallampati: II  TM Distance: >3 FB     Dental   Pulmonary COPD, Current Smoker,    breath sounds clear to auscultation       Cardiovascular hypertension, + CAD   Rhythm:Regular Rate:Normal     Neuro/Psych    GI/Hepatic Neg liver ROS, GERD  ,  Endo/Other  diabetes  Renal/GU Renal disease     Musculoskeletal  (+) Arthritis ,   Abdominal   Peds  Hematology   Anesthesia Other Findings   Reproductive/Obstetrics                             Anesthesia Physical Anesthesia Plan  ASA: III  Anesthesia Plan: MAC   Post-op Pain Management:    Induction: Intravenous  PONV Risk Score and Plan: Treatment may vary due to age or medical condition and Midazolam  Airway Management Planned: Simple Face Mask and Nasal Cannula  Additional Equipment:   Intra-op Plan:   Post-operative Plan:   Informed Consent: I have reviewed the patients History and Physical, chart, labs and discussed the procedure including the risks, benefits and alternatives for the proposed anesthesia with the patient or authorized representative who has indicated his/her understanding and acceptance.   Dental advisory given  Plan Discussed with: CRNA and Anesthesiologist  Anesthesia Plan Comments:         Anesthesia Quick Evaluation

## 2018-03-26 NOTE — Transfer of Care (Signed)
Immediate Anesthesia Transfer of Care Note  Patient: Marquette Piontek Wright-McQueen  Procedure(s) Performed: INSERTION OF DIALYSIS CATHETER - RIGHT INTERNAL JUGULAR (Right Chest) REMOVAL OF A DIALYSIS CATHETER - RIGHT JUGULAR (Right Neck)  Patient Location: PACU  Anesthesia Type:MAC  Level of Consciousness: awake, alert  and oriented  Airway & Oxygen Therapy: Patient Spontanous Breathing and Patient connected to nasal cannula oxygen  Post-op Assessment: Report given to RN and Post -op Vital signs reviewed and stable  Post vital signs: Reviewed and stable  Last Vitals:  Vitals Value Taken Time  BP 96/48 03/26/2018  8:19 AM  Temp    Pulse 73 03/26/2018  8:20 AM  Resp 0 03/26/2018  8:20 AM  SpO2 100 % 03/26/2018  8:20 AM  Vitals shown include unvalidated device data.  Last Pain:  Vitals:   03/26/18 0451  TempSrc: Oral  PainSc:          Complications: No apparent anesthesia complications

## 2018-03-26 NOTE — Progress Notes (Addendum)
[facility] Progress Note  Subjective: TDC placed this am, temp IJ cath removed.  No further fevers.  Blood cx's neg.   Vitals:   03/26/18 0830 03/26/18 0837 03/26/18 0925 03/26/18 1231  BP: (!) 122/50  (!) 120/53 (!) 97/57  Pulse: 73 71 70 78  Resp: 15 12 18 18   Temp:   98.2 F (36.8 C) 98.3 F (36.8 C)  TempSrc:   Oral Oral  SpO2: 100% 99% 97% 96%  Weight:      Height:        Inpatient medications: . amLODipine  10 mg Oral Daily  . calcitRIOL  0.5 mcg Oral Q T,Th,Sa-HD  . calcium acetate  667 mg Oral TID WC  . carvedilol  25 mg Oral BID WC  . cloNIDine  0.1 mg Oral BID  . heparin  5,000 Units Subcutaneous Q8H   . sodium chloride Stopped (03/26/18 0813)   sodium chloride, sodium chloride flush  Iron/TIBC/Ferritin/ %Sat    Component Value Date/Time   IRON 31 07/23/2017 0554   TIBC 176 (L) 07/23/2017 0554   FERRITIN 43 07/23/2017 0554   IRONPCTSAT 18 07/23/2017 0554    Exam: Gen extubated, alert, no distress Chest mild bibasilar rales, o/w clear RRR no MRG Abd soft ntnd no mass or ascites +bs Ext no LE or UE edema Neuro is alert, Ox 3 , nf L forearm AVF, R IJ temp cath   Home meds:  - amlodipine 10/ carvedilol 25 bid/ clonidine 0.1 bid/ furosemide 40 qd/ spironolactone 25 qd  - sod bicarbonate 1300 qd/ calcium acetate 1 ac  - aspirin 81  - oxycodone IR prn/ hydrocodone- acetaminophen qid prn   Dialysis: [facility] TTS  4h  250/1.5  53.5kg  3K/2.5Ca  Hep 1600  - mircera 100 q 2, last 11.21, last Hb 8.4  - calc po 0.5 ug tiw, pth 1698    Impression/ Plan: 1. Acute resp failure/ severe pulm edema - resolved 2. ESRD - started HD 3 wks ago. L AVF placed July 2019 now not functioning despite fistulogram/ angioplasty.  SP RIJ TDC today 3. Malfunctioning AVF - sp balloon angioplasty to 60% midAVF stenosis 11/27, still not functional. Suspect will not mature, may need new access ?AVG in OP setting. Plan HD tomorrow am, if cath working well can be  dc'd home after HD.   4. HTN - dry wt down 47-48kg now, and BP's dropped. Will decrease BP meds.  5. 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, phoslo   Kelly Splinter MD [facility] pager [phone]   03/26/2018, 2:34 PM   Recent Labs  Lab 03/23/18 0451 03/24/18 1300 03/25/18 0743 03/26/18 0552  NA 137 138 136 135  K 3.8 3.9 4.0 4.0  CL 101 100 101 100  CO2 21* 26 26 23   GLUCOSE 89 113* 100* 95  BUN 41* 40* 32* 51*  CREATININE 4.53* 4.78* 4.60* 6.31*  CALCIUM 9.2 9.2 9.2 9.2  PHOS 6.7* 6.1*  --   --   ALBUMIN  --  2.8* 2.7*  --    Recent Labs  Lab 03/25/18 0743  AST 10*  ALT 13  ALKPHOS 60  BILITOT 0.8  PROT 6.5   Recent Labs  Lab 03/25/18 0743 03/26/18 0552  WBC 8.4 6.5  HGB 8.6* 8.0*  HCT 28.6* 27.0*  MCV 96.3 95.7  PLT 230 229    ]

## 2018-03-26 NOTE — Plan of Care (Signed)
  Problem: Education: Goal: Knowledge of General Education information will improve Description Including pain rating scale, medication(s)/side effects and non-pharmacologic comfort measures Outcome: Progressing   Problem: Health Behavior/Discharge Planning: Goal: Ability to manage health-related needs will improve Outcome: Progressing   

## 2018-03-26 NOTE — Anesthesia Postprocedure Evaluation (Signed)
Anesthesia Post Note  Patient: Stephanie Gross  Procedure(s) Performed: INSERTION OF DIALYSIS CATHETER - RIGHT INTERNAL JUGULAR (Right Chest) REMOVAL OF A DIALYSIS CATHETER - RIGHT JUGULAR (Right Neck)     Patient location during evaluation: PACU Anesthesia Type: MAC Level of consciousness: awake Pain management: pain level controlled Respiratory status: spontaneous breathing Cardiovascular status: stable Postop Assessment: no apparent nausea or vomiting Anesthetic complications: no    Last Vitals:  Vitals:   03/26/18 0925 03/26/18 1231  BP: (!) 120/53 (!) 97/57  Pulse: 70 78  Resp: 18 18  Temp: 36.8 C 36.8 C  SpO2: 97% 96%    Last Pain:  Vitals:   03/26/18 1231  TempSrc: Oral  PainSc:                  Soriah Leeman

## 2018-03-26 NOTE — Progress Notes (Signed)
  Progress Note    03/26/2018 7:17 AM Day of Surgery  Subjective:  No complaints tonight  Vitals:   03/25/18 2000 03/26/18 0451  BP: 128/63 110/61  Pulse: 93 81  Resp: 18 18  Temp: 99.1 F (37.3 C) 98.7 F (37.1 C)  SpO2: 98% 97%    Physical Exam: aaox3 Non labored respirations Left arm with fistula and thrill above antecubitum  CBC    Component Value Date/Time   WBC 6.5 03/26/2018 0552   RBC 2.82 (L) 03/26/2018 0552   HGB 8.0 (L) 03/26/2018 0552   HGB 9.3 (L) 08/03/2017 1635   HCT 27.0 (L) 03/26/2018 0552   HCT 29.0 (L) 08/03/2017 1635   PLT 229 03/26/2018 0552   PLT 231 08/03/2017 1635   MCV 95.7 03/26/2018 0552   MCV 90 08/03/2017 1635   MCH 28.4 03/26/2018 0552   MCHC 29.6 (L) 03/26/2018 0552   RDW 14.3 03/26/2018 0552   RDW 15.5 (H) 08/03/2017 1635   LYMPHSABS 1.4 02/21/2018 1257   LYMPHSABS 2.1 08/06/2015 1610   MONOABS 0.5 02/21/2018 1257   EOSABS 0.1 02/21/2018 1257   EOSABS 0.1 08/06/2015 1610   BASOSABS 0.1 02/21/2018 1257   BASOSABS 0.1 08/06/2015 1610    BMET    Component Value Date/Time   NA 135 03/26/2018 0552   NA 141 08/17/2017 1625   K 4.0 03/26/2018 0552   CL 100 03/26/2018 0552   CO2 23 03/26/2018 0552   GLUCOSE 95 03/26/2018 0552   BUN 51 (H) 03/26/2018 0552   BUN 26 08/17/2017 1625   CREATININE 6.31 (H) 03/26/2018 0552   CREATININE 1.17 (H) 10/22/2012 1324   CALCIUM 9.2 03/26/2018 0552   CALCIUM 10.0 02/07/2012 1505   GFRNONAA 6 (L) 03/26/2018 0552   GFRNONAA 49 (L) 10/22/2012 1324   GFRAA 7 (L) 03/26/2018 0552   GFRAA 57 (L) 10/22/2012 1324    INR    Component Value Date/Time   INR 0.90 02/07/2012 1116     Intake/Output Summary (Last 24 hours) at 03/26/2018 0717 Last data filed at 03/25/2018 1643 Gross per 24 hour  Intake 52.84 ml  Output 0 ml  Net 52.84 ml     Assessment:  72 y.o. female is s/p fistulogram with pta that is malfunctional. Needs permanent access  Plan: OR today for catheter exchange to  tdc Will be ok for discharge after and hopefully fistula will work after arm is given time to rest   Math Brazie C. Donzetta Matters, MD [facility] Office: [phone] Pager: [phone]  03/26/2018 7:17 AM

## 2018-03-27 ENCOUNTER — Encounter (HOSPITAL_COMMUNITY): Payer: Self-pay | Admitting: Vascular Surgery

## 2018-03-27 DIAGNOSIS — J811 Chronic pulmonary edema: Secondary | ICD-10-CM

## 2018-03-27 LAB — URINALYSIS, ROUTINE W REFLEX MICROSCOPIC
BILIRUBIN URINE: NEGATIVE
Glucose, UA: 50 mg/dL — AB
HGB URINE DIPSTICK: NEGATIVE
Ketones, ur: NEGATIVE mg/dL
Leukocytes, UA: NEGATIVE
NITRITE: NEGATIVE
Protein, ur: 100 mg/dL — AB
SPECIFIC GRAVITY, URINE: 1.015 (ref 1.005–1.030)
pH: 8 (ref 5.0–8.0)

## 2018-03-27 LAB — CULTURE, BLOOD (ROUTINE X 2)
CULTURE: NO GROWTH
CULTURE: NO GROWTH
Special Requests: ADEQUATE

## 2018-03-27 LAB — RENAL FUNCTION PANEL
Albumin: 2.5 g/dL — ABNORMAL LOW (ref 3.5–5.0)
Anion gap: 15 (ref 5–15)
BUN: 68 mg/dL — AB (ref 8–23)
CO2: 22 mmol/L (ref 22–32)
CREATININE: 7.68 mg/dL — AB (ref 0.44–1.00)
Calcium: 9.1 mg/dL (ref 8.9–10.3)
Chloride: 99 mmol/L (ref 98–111)
GFR calc Af Amer: 6 mL/min — ABNORMAL LOW (ref >60)
GFR calc non Af Amer: 5 mL/min — ABNORMAL LOW (ref >60)
Glucose, Bld: 94 mg/dL (ref 70–99)
Phosphorus: 6.8 mg/dL — ABNORMAL HIGH (ref 2.5–4.6)
Potassium: 4.5 mmol/L (ref 3.5–5.1)
SODIUM: 136 mmol/L (ref 135–145)

## 2018-03-27 LAB — CBC
HCT: 28 % — ABNORMAL LOW (ref 36.0–46.0)
Hemoglobin: 8.2 g/dL — ABNORMAL LOW (ref 12.0–15.0)
MCH: 28.3 pg (ref 26.0–34.0)
MCHC: 29.3 g/dL — ABNORMAL LOW (ref 30.0–36.0)
MCV: 96.6 fL (ref 80.0–100.0)
PLATELETS: 244 10*3/uL (ref 150–400)
RBC: 2.9 MIL/uL — ABNORMAL LOW (ref 3.87–5.11)
RDW: 14.3 % (ref 11.5–15.5)
WBC: 5.9 10*3/uL (ref 4.0–10.5)
nRBC: 0 % (ref 0.0–0.2)

## 2018-03-27 LAB — GLUCOSE, CAPILLARY: Glucose-Capillary: 147 mg/dL — ABNORMAL HIGH (ref 70–99)

## 2018-03-27 MED ORDER — CALCITRIOL 0.5 MCG PO CAPS
ORAL_CAPSULE | ORAL | Status: AC
  Administered 2018-03-27: 0.5 ug via ORAL
  Filled 2018-03-27: qty 1

## 2018-03-27 MED ORDER — HEPARIN SODIUM (PORCINE) 1000 UNIT/ML IJ SOLN
INTRAMUSCULAR | Status: AC
  Administered 2018-03-27: 3000 [IU] via INTRAVENOUS_CENTRAL
  Filled 2018-03-27: qty 4

## 2018-03-27 MED ORDER — HEPARIN SODIUM (PORCINE) 1000 UNIT/ML IJ SOLN
INTRAMUSCULAR | Status: AC
  Administered 2018-03-27: 1600 [IU] via INTRAVENOUS_CENTRAL
  Filled 2018-03-27: qty 2

## 2018-03-27 MED ORDER — CARVEDILOL 25 MG PO TABS: 25.0000 mg | ORAL_TABLET | Freq: Two times a day (BID) | ORAL | 0 refills | Status: AC

## 2018-03-27 MED ORDER — HEPARIN SODIUM (PORCINE) 1000 UNIT/ML DIALYSIS
1600.0000 [IU] | Freq: Once | INTRAMUSCULAR | Status: AC
  Administered 2018-03-27: 1600 [IU] via INTRAVENOUS_CENTRAL
  Administered 2018-03-27: 3000 [IU] via INTRAVENOUS_CENTRAL

## 2018-03-27 MED ORDER — CALCIUM ACETATE (PHOS BINDER) 667 MG PO CAPS: 667.0000 mg | ORAL_CAPSULE | Freq: Three times a day (TID) | ORAL | 3 refills | Status: AC

## 2018-03-27 NOTE — Progress Notes (Addendum)
Pt tolerated lunch well  Completed PT eval  Paged MD to inform  Pt sons at bedside ready for discharge

## 2018-03-27 NOTE — Discharge Summary (Signed)
Name: Stephanie Gross MRN: [id number] DOB: January 06, 1946 72 y.o. PCP: Velna Ochs, MD  Date of Admission: 03/22/2018  3:43 AM Date of Discharge: 03/27/18 Attending Physician: Aldine Contes, MD  Discharge Diagnosis:  ESRD Acute hypoxemic respiratory failure  Temporary dialysis line infection  Hypertensive crisis   Discharge Medications: Allergies as of 03/27/2018      Reactions   Benazepril-hydrochlorothiazide Other (See Comments)   UNKNOWN. [place] with lisinopril   Chantix [varenicline] Other (See Comments)   SUICIDAL IDEATION   Codeine Nausea And Vomiting   Methylprednisolone Other (See Comments)   UNKNOWN   Statins Other (See Comments)   "walking into a hole", heavy legs, feels like she is going to fall      Medication List    STOP taking these medications   cloNIDine 0.1 MG tablet Commonly known as:  CATAPRES   HYDROcodone-acetaminophen 5-325 MG tablet Commonly known as:  NORCO/VICODIN   oxyCODONE 5 MG immediate release tablet Commonly known as:  Oxy IR/ROXICODONE     TAKE these medications   amLODipine 10 MG tablet Commonly known as:  NORVASC Take 1 tablet (10 mg total) by mouth daily.   aspirin EC 81 MG tablet Take 1 tablet (81 mg total) by mouth daily.   calcitRIOL 0.5 MCG capsule Commonly known as:  ROCALTROL Take 0.5 mcg by mouth daily.   calcitRIOL 0.25 MCG capsule Commonly known as:  ROCALTROL Take 1 capsule (0.25 mcg total) by mouth daily.   calcium acetate 667 MG capsule Commonly known as:  PHOSLO Take 667 mg by mouth 3 (three) times daily with meals.   carvedilol 25 MG tablet Commonly known as:  COREG Take 1 tablet (25 mg total) by mouth 2 (two) times daily.   cholecalciferol 25 MCG (1000 UT) tablet Commonly known as:  VITAMIN D Take 1 tablet (1,000 Units total) by mouth daily.   furosemide 20 MG tablet Commonly known as:  LASIX Take 1 tablet (20 mg total) by mouth daily.   Iron 325 (65 Fe) MG Tabs Take 325 mg by  mouth daily.   sodium bicarbonate 650 MG tablet Take 2 tablets (1,300 mg total) by mouth daily. What changed:  when to take this   spironolactone 25 MG tablet Commonly known as:  ALDACTONE Take 1 tablet (25 mg total) by mouth daily.       Disposition and follow-up:   Ms.Seynabou D Wright-McQueen was discharged from [facility] in Stable condition.  At the hospital follow up visit please address:  1.  ESRD: follow up with left AVF and whether it can be used at later time for dialysis. Tunneled dialysis placed 03/26/18.   Acute hypoxemic respiratory failure: patient was saturating well on discharge  Hypertension: patient is to continue amlodipine 10mg  qd, coreg 25 mg bid. Clonidine 0.1mg  bid was discontinued during hospitalization.  2.  Labs / imaging needed at time of follow-up: None  3.  Pending labs/ test needing follow-up: None  Follow-up Appointments: Follow-up Information    [facility]   Specialty:  Vascular Surgery Why:  As needed Contact information: [address] [place] Hospital Course by problem list:  Acute Hypoxemic respiratory failure  Patient presented with acute respiratory failure and post collapsing at home. EMS found with oxygen saturation to 66%, she was bag mask ventilated en route and intubated on arrival to ED. Chest xray showed severe bilateral alveolar infiltrates.  Patient thought to have pulmonary edema secondary to missed HD. Respiratory status improved and patient extubated on 11/26 when she was extubated and transitioned to 4L [place]. Patient's respiratory status continued to improve throughout hospitalization. On day of discharge patient was respirating well on room air.   ESRD on HD TTS Patient is new to HD, started HD 3 weeks prior to admission. Patient was last dialyzed 4 days prior to admission on 11/21 for half a session and not dialyzed on  scheduled dialysis session on 11/23 due to issues with cannulation of AVF. Patient's left ulnar cephalic fistula was placed 11/12/17 and it was being used since October 2019.  New temporary CVC was placed on 11/25 for dialysis access. She was dialyzed on 11/25 and 11/27 via [place].   Vascular surgery consulted and they performed left arm fistulogram which revealed 45% left cephalic vein stenosis that was treated with balloon angioplasty with resulting post intervention stenosis of 10%.    AVF was still not able to be used, therefore tunneled dialysis cath placed 11/29 and patient successfully dialyzed 11/30 prior to discharge.   Line infection  Patient noted to have fever to 101.9 on 03/24/18. Blood cultures collected, foley catheter removed 11/27, TDC removed 11/29, and patient started on vancomycin. Empiric dose vancomycin given. Blood cultures returned negative (collected 11/25- no growth 5 days, collected 11/27-no growth 3 days).  Hypertensive Crisis Patient presented with blood pressure of 240/120 on arrival The patient is taking amlodipine 10mg  qd, carvedilol 25mg  bid, clonidine 0.1mg  bid, furosemide 40mg  qd, spironolactone 25mg  qd prior to admission. On admission, patient was placed on propofol infusion with prn hydralazine. After dialysis sessions, patient's hypertension improved and she was resumed on her home blood pressure medications of amlodipine 10mg  qd, coreg 25 mg bid. Patient's clonidine 0.1mg  bid was discontinued. Patient's dry weight was decreased to 47-48kg so opted to decrease anti-hypertensives.   Anemia of Chronic Kidney Disease  Patient with Hb=10 on admission and hb=8.2 on discharge. Nephrology monitoring. Patient got last ESA 11/21 and is due for next dose on 12/4.   Discharge Vitals:   BP 129/61 (BP Location: Right Arm)   Pulse 84   Temp 99.4 F (37.4 C) (Oral)   Resp 17   Ht 5\' 5"  (1.651 m)   Wt 48.5 kg   SpO2 97%   BMI 17.79 kg/m   Pertinent Labs, Studies, and  Procedures:   CBC Latest Ref Rng & Units 03/27/2018 03/26/2018 03/25/2018  WBC 4.0 - 10.5 K/uL 5.9 6.5 8.4  Hemoglobin 12.0 - 15.0 g/dL 8.2(L) 8.0(L) 8.6(L)  Hematocrit 36.0 - 46.0 % 28.0(L) 27.0(L) 28.6(L)  Platelets 150 - 400 K/uL 244 229 230   BMP Latest Ref Rng & Units 03/27/2018 03/26/2018 03/25/2018  Glucose 70 - 99 mg/dL 94 95 100(H)  BUN 8 - 23 mg/dL 68(H) 51(H) 32(H)  Creatinine 0.44 - 1.00 mg/dL 7.68(H) 6.31(H) 4.60(H)  BUN/Creat Ratio 12 - 28 - - -  Sodium 135 - 145 mmol/L 136 135 136  Potassium 3.5 - 5.1 mmol/L 4.5 4.0 4.0  Chloride 98 - 111 mmol/L 99 100 101  CO2 22 - 32 mmol/L 22 23 26   Calcium 8.9 - 10.3 mg/dL 9.1 9.2 9.2   Chest xray 03/22/18 1. Right mainstem intubation. This has been subsequently corrected. Enteric tube tip and side-port below the diaphragm not included in the field of view but likely in the stomach. 2. Cardiomegaly. Diffuse bilateral perihilar opacities favor pulmonary edema, multifocal infection or aspiration are also  Considered.  AV Fistulagram 03/24/18 Findings:  No central venous stenosis.  The arterial venous anastomosis is widely patent.  In the midportion of the fistula there was luminal narrowing of approximately 60%                Intervention: After the above images were acquired the decision was made to proceed with intervention.  Over an 035 wire, a 6 French sheath was placed.  I selected a 7 x 60 Mustang balloon and performed balloon angioplasty for 2 separate inflations of the narrowed area in the cephalic vein.  Residual stenosis was less than 10%.  Cannulation site was closed with suture.  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   Discharge Instructions:   Signed: Lars Mage, MD 03/27/2018, 1:43 PM   Pager: [phone]

## 2018-03-27 NOTE — Progress Notes (Signed)
[facility] Progress Note  Subjective: TDC running well on HD this am.  R leg temp cath removed yest.   Vitals:   03/27/18 1115 03/27/18 1130 03/27/18 1200 03/27/18 1206  BP: 130/61 135/66 137/63 137/62  Pulse: 77 75 72 76  Resp:    18  Temp:    98.5 F (36.9 C)  TempSrc:    Oral  SpO2:    96%  Weight:    48.5 kg  Height:        Inpatient medications: . amLODipine  5 mg Oral Daily  . calcitRIOL  0.5 mcg Oral Q T,Th,Sa-HD  . calcium acetate  667 mg Oral TID WC  . carvedilol  25 mg Oral BID WC  . Chlorhexidine Gluconate Cloth  6 each Topical Q0600  . heparin  5,000 Units Subcutaneous Q8H  . senna-docusate  2 tablet Oral Daily   . sodium chloride Stopped (03/26/18 0813)   sodium chloride, sodium chloride flush  Iron/TIBC/Ferritin/ %Sat    Component Value Date/Time   IRON 31 07/23/2017 0554   TIBC 176 (L) 07/23/2017 0554   FERRITIN 43 07/23/2017 0554   IRONPCTSAT 18 07/23/2017 0554    Exam: Gen extubated, alert, no distress Chest mild bibasilar rales, o/w clear RRR no MRG Abd soft ntnd no mass or ascites +bs Ext no LE or UE edema Neuro is alert, Ox 3 , nf L forearm AVF, R IJ temp cath   Home meds:  - amlodipine 10/ carvedilol 25 bid/ clonidine 0.1 bid/ furosemide 40 qd/ spironolactone 25 qd  - sod bicarbonate 1300 qd/ calcium acetate 1 ac  - aspirin 81  - oxycodone IR prn/ hydrocodone- acetaminophen qid prn   Dialysis: GO TTS  4h  250/1.5  53.5kg  3K/2.5Ca  Hep 1600  - mircera 100 q 2, last 11.21, last Hb 8.4  - calc po 0.5 ug tiw, pth 1698    Impression/ Plan: 1. Acute resp failure/ pulm edema - resolved 2. ESRD - started HD 3 wks ago. L AVF placed July 2019 now not functioning despite fistulogram/ angioplasty.  SP RIJ TDC 11/30, will use this for now.  3. Malfunctioning AVF - sp balloon angioplasty to 60% midAVF stenosis 11/27, still not functional. Suspect may not mature, may need new access ?AVG in OP setting 4. HTN - dry wt down  47-48kg now, BP's dropped so we decreased BP meds.  5. 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, phoslo   Kelly Splinter MD [facility] pager [phone]   03/27/2018, 12:34 PM   Recent Labs  Lab 03/24/18 1300 03/25/18 0743 03/26/18 0552 03/27/18 0817  NA 138 136 135 136  K 3.9 4.0 4.0 4.5  CL 100 101 100 99  CO2 26 26 23 22   GLUCOSE 113* 100* 95 94  BUN 40* 32* 51* 68*  CREATININE 4.78* 4.60* 6.31* 7.68*  CALCIUM 9.2 9.2 9.2 9.1  PHOS 6.1*  --   --  6.8*  ALBUMIN 2.8* 2.7*  --  2.5*   Recent Labs  Lab 03/25/18 0743  AST 10*  ALT 13  ALKPHOS 60  BILITOT 0.8  PROT 6.5   Recent Labs  Lab 03/26/18 0552 03/27/18 0817  WBC 6.5 5.9  HGB 8.0* 8.2*  HCT 27.0* 28.0*  MCV 95.7 96.6  PLT 229 244    ]

## 2018-03-27 NOTE — Progress Notes (Signed)
Paged PT to inform pt has arrived back on the unit  PT at bedside now

## 2018-03-27 NOTE — Progress Notes (Signed)
   Subjective: The patient was seen resting in her bed this morning. She mentioned that she was feeling fatigued. She denies any chest pain, dyspnea, or dizziness.   Objective:  Vital signs in last 24 hours: Vitals:   03/26/18 1231 03/26/18 1829 03/26/18 2031 03/27/18 0405  BP: (!) 97/57 (!) 115/52 (!) 114/52 (!) 117/54  Pulse: 78 79 72 73  Resp: 18  18 16   Temp: 98.3 F (36.8 C)  99.4 F (37.4 C) 98.9 F (37.2 C)  TempSrc: Oral  Oral Oral  SpO2: 96%  98% 97%  Weight:    48.9 kg  Height:       Physical Exam  Constitutional: Appears well-developed and well-nourished. No distress.  HENT:  Head: Normocephalic and atraumatic.  Eyes: Conjunctivae are normal.  Cardiovascular: Normal rate, regular rhythm and normal heart sounds.  Respiratory: Effort normal and breath sounds normal. No respiratory distress. No wheezes.  GI: Soft. Bowel sounds are normal. No distension. There is no tenderness.  Musculoskeletal: No edema.  Neurological: Is alert.  Skin: Not diaphoretic. No erythema.  Psychiatric: Normal mood and affect. Behavior is normal. Judgment and thought content normal.   Assessment/Plan:  Ms. Grabbe is a 72 y.o female with ESRD on HD TTS, COPD, CVA, HTN who presented with acute hypoxemic respiratory failure and pulmonary edema thought to be due to missed HD in setting of nonfunctioning AV fistula. Now has temporary HD cath placed on 11/29 and is receiving dialysis.  ESRD with nonfunctioning L AVF s/p ballon angioplasty to 60% mid AVF stenosis on 11/27 Patient is to get dialysis today via her RIJ tunneled dialysis catheter. Labs to be drawn after dialysis.   -Will monitor for maturation of AVF outpatient -May plan for AV graft as outpatient -Appreciate nephrology following  -Continue phoslo and calcitriol  Acute hypoxemic respiratory failure Patient currently saturating in the 90% on room air. She was extubated on 11/26.  Presumed line infection 2/2 temporary  dialysis catheter Patient continues to be afebrile and without leukocytosis. Blood cultures drawn 11/27 from right IJ remain no growth for 2 days.   Hypertension Continue home medications - amlodipine 10mg  qd, coreg 25mg  bid, clonidine 0.1mg  bid  Anemia of chronic disease Hb=8.2 today 11/30 from 10 on admission. Ferritin=232, Fe=41, and Fe sat=20% in September 2019.   Dispo: Anticipated discharge in approximately 0-1 day(s).  PENDING PT EVALUATION FOR DISCHARGE.  Lars Mage, MD 03/27/2018, 6:45 AM Pager: [phone]

## 2018-03-27 NOTE — Progress Notes (Signed)
PT Cancellation Note  Patient Details Name: Stephanie Gross MRN: [id number] DOB: January 03, 1946   Cancelled Treatment:    Reason Eval/Treat Not Completed: Patient at procedure or test/unavailable. Pt in HD.   Shary Decamp [facility] 03/27/2018, 9:24 AM [place] Pager [phone] Office [phone]

## 2018-03-27 NOTE — Discharge Instructions (Signed)
It was a pleasure to take care of you Stephanie Gross! Please make sure to follow up with nephrology and go to your dialysis appointments.

## 2018-03-27 NOTE — Evaluation (Signed)
Physical Therapy Evaluation Patient Details Name: Stephanie Gross MRN: [id number] DOB: Dec 22, 1945 Today's Date: 03/27/2018   History of Present Illness  Pt adm with acute hypoxic respiratory failure due to missed HD due to malfunctioning HD access. PMH - Lt TKR, CVA, HTN, ESRD on HD  Clinical Impression  Pt presents to PT with general deconditioning due to limited activity during hospital stay. Recommend pt return home and resume her normal activities gradually. Recommend she not try to return to work immediately. Expect she will build up her activity tolerance by performing her normal activities.     Follow Up Recommendations No PT follow up    Equipment Recommendations  None recommended by PT    Recommendations for Other Services       Precautions / Restrictions Precautions Precautions: Fall Restrictions Weight Bearing Restrictions: No      Mobility  Bed Mobility Overal bed mobility: Modified Independent                Transfers Overall transfer level: Modified independent Equipment used: None                Ambulation/Gait Ambulation/Gait assistance: Modified independent (Device/Increase time);Supervision;Min guard Gait Distance (Feet): 250 Feet Assistive device: None;4-wheeled walker Gait Pattern/deviations: Step-through pattern;Decreased stride length Gait velocity: decr Gait velocity interpretation: 1.31 - 2.62 ft/sec, indicative of limited community ambulator General Gait Details: Initially pt min guard due to slight unsteadiness with no overt loss of balance. Pt then used rollator for ~150' with supervision with steady gait. Then able to amb remainder without assistive device with modified independent.   Stairs            Wheelchair Mobility    Modified Rankin (Stroke Patients Only)       Balance Overall balance assessment: Needs assistance Sitting-balance support: No upper extremity supported;Feet supported Sitting  balance-Leahy Scale: Good     Standing balance support: No upper extremity supported;During functional activity Standing balance-Leahy Scale: Fair Standing balance comment: Pt primarily with stable gait. Upon returning to room pt bent over to fix bed pad and remained bent over at the waist for 10-15 seconds. Pt required min assist for stability. Reported felt woozy transiently.                             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)     Hand Dominance   Dominant Hand: Right    Extremity/Trunk Assessment   Upper Extremity Assessment Upper Extremity Assessment: Generalized weakness    Lower Extremity Assessment Lower Extremity Assessment: Generalized weakness       Communication   Communication: No difficulties  Cognition Arousal/Alertness: Awake/alert Behavior During Therapy: WFL for tasks assessed/performed Overall Cognitive Status: Within Functional Limits for tasks assessed                                        General Comments  Exercises     Assessment/Plan    PT Assessment Patent does not need any further PT services  PT Problem List         PT Treatment Interventions      PT Goals (Current goals can be found in the Care Plan section)  Acute Rehab PT Goals PT Goal Formulation: All assessment and education complete, DC therapy    Frequency     Barriers to discharge        Co-evaluation                AM-PAC PT "6 Clicks" Mobility  Outcome Measure Help needed turning from your back to your side while in a flat bed without using bedrails?: None Help needed moving from lying on your back to sitting on the side of a flat bed without using bedrails?: None Help needed moving to and from a bed to a chair (including a wheelchair)?: None Help needed standing up from a chair using your arms (e.g., wheelchair or bedside chair)?: None Help needed to walk in hospital room?: None Help needed climbing 3-5 steps with a railing? : A Little 6 Click Score: 23    End of Session Equipment Utilized During Treatment: Gait belt Activity Tolerance: Patient tolerated treatment well Patient left: in bed;with call bell/phone within reach;with family/visitor present(sitting EOB) Nurse Communication: Mobility status PT Visit Diagnosis: Unsteadiness on feet (R26.81);Muscle weakness (generalized) (M62.81)    Time: 8527-7824 PT Time Calculation (min) (ACUTE ONLY): 16 min   Charges:   PT Evaluation $PT Eval Low Complexity: [place] Pager [phone] Office [place] 03/27/2018, 2:28 PM

## 2018-03-27 NOTE — Progress Notes (Signed)
Phlebotomy reported to this RN that the pt refused AM lab draw peripherally. Pt has double lumen HD cath. IV team order entered for DBIV. IV team informed patient that labs will be drawn during hemodialysis (1st case this AM). MD informed. Will continue to monitor.

## 2018-03-27 NOTE — Progress Notes (Signed)
Pt states she does not have phoslo or coreg  Informed MD  MD provided printed prescriptions  Discharge education provided to pt and pt sons at bedside  Pt has all belongings including printed prescriptions and out of work letter  Pt IV removed, catheter intact, and telemetry removed  Pt discharged via wheelchair with NT

## 2018-03-27 NOTE — Progress Notes (Signed)
Pt denies wanting breakfast at this time Pt resting comfortably in bed Transport at bedside to take pt to HD

## 2018-03-27 NOTE — Progress Notes (Signed)
OT Cancellation Note  Patient Details Name: Stephanie Gross MRN: [id number] DOB: 1945/09/19   Cancelled Treatment:    Reason Eval/Treat Not Completed: Patient at procedure or test/ unavailable. Pt in dialysis.  [place], OTR/L Acute Rehab Services Pager [phone] Office [phone]     Almon Register 03/27/2018, 8:12 AM

## 2018-03-28 LAB — URINE CULTURE

## 2018-03-29 ENCOUNTER — Encounter (HOSPITAL_COMMUNITY): Payer: Self-pay

## 2018-03-29 ENCOUNTER — Telehealth: Payer: Self-pay | Admitting: Internal Medicine

## 2018-03-29 LAB — CULTURE, BLOOD (ROUTINE X 2)
CULTURE: NO GROWTH
Special Requests: ADEQUATE

## 2018-03-29 NOTE — Telephone Encounter (Signed)
TOC HFU APPT with Dr Philipp Ovens on 04/12/2018

## 2018-03-30 DIAGNOSIS — D688 Other specified coagulation defects: Secondary | ICD-10-CM | POA: Diagnosis not present

## 2018-03-30 DIAGNOSIS — N186 End stage renal disease: Secondary | ICD-10-CM | POA: Diagnosis not present

## 2018-03-30 DIAGNOSIS — D509 Iron deficiency anemia, unspecified: Secondary | ICD-10-CM | POA: Diagnosis not present

## 2018-03-30 DIAGNOSIS — N2581 Secondary hyperparathyroidism of renal origin: Secondary | ICD-10-CM | POA: Diagnosis not present

## 2018-03-30 DIAGNOSIS — Z23 Encounter for immunization: Secondary | ICD-10-CM | POA: Diagnosis not present

## 2018-03-30 DIAGNOSIS — D631 Anemia in chronic kidney disease: Secondary | ICD-10-CM | POA: Diagnosis not present

## 2018-03-30 LAB — CULTURE, BLOOD (ROUTINE X 2)
Culture: NO GROWTH
Special Requests: ADEQUATE

## 2018-04-01 DIAGNOSIS — D631 Anemia in chronic kidney disease: Secondary | ICD-10-CM | POA: Diagnosis not present

## 2018-04-01 DIAGNOSIS — D688 Other specified coagulation defects: Secondary | ICD-10-CM | POA: Diagnosis not present

## 2018-04-01 DIAGNOSIS — D509 Iron deficiency anemia, unspecified: Secondary | ICD-10-CM | POA: Diagnosis not present

## 2018-04-01 DIAGNOSIS — N2581 Secondary hyperparathyroidism of renal origin: Secondary | ICD-10-CM | POA: Diagnosis not present

## 2018-04-01 DIAGNOSIS — N186 End stage renal disease: Secondary | ICD-10-CM | POA: Diagnosis not present

## 2018-04-01 DIAGNOSIS — Z23 Encounter for immunization: Secondary | ICD-10-CM | POA: Diagnosis not present

## 2018-04-03 DIAGNOSIS — D631 Anemia in chronic kidney disease: Secondary | ICD-10-CM | POA: Diagnosis not present

## 2018-04-03 DIAGNOSIS — N186 End stage renal disease: Secondary | ICD-10-CM | POA: Diagnosis not present

## 2018-04-03 DIAGNOSIS — D509 Iron deficiency anemia, unspecified: Secondary | ICD-10-CM | POA: Diagnosis not present

## 2018-04-03 DIAGNOSIS — D688 Other specified coagulation defects: Secondary | ICD-10-CM | POA: Diagnosis not present

## 2018-04-03 DIAGNOSIS — N2581 Secondary hyperparathyroidism of renal origin: Secondary | ICD-10-CM | POA: Diagnosis not present

## 2018-04-03 DIAGNOSIS — Z23 Encounter for immunization: Secondary | ICD-10-CM | POA: Diagnosis not present

## 2018-04-06 DIAGNOSIS — N186 End stage renal disease: Secondary | ICD-10-CM | POA: Diagnosis not present

## 2018-04-06 DIAGNOSIS — D631 Anemia in chronic kidney disease: Secondary | ICD-10-CM | POA: Diagnosis not present

## 2018-04-06 DIAGNOSIS — D688 Other specified coagulation defects: Secondary | ICD-10-CM | POA: Diagnosis not present

## 2018-04-06 DIAGNOSIS — Z23 Encounter for immunization: Secondary | ICD-10-CM | POA: Diagnosis not present

## 2018-04-06 DIAGNOSIS — N2581 Secondary hyperparathyroidism of renal origin: Secondary | ICD-10-CM | POA: Diagnosis not present

## 2018-04-06 DIAGNOSIS — D509 Iron deficiency anemia, unspecified: Secondary | ICD-10-CM | POA: Diagnosis not present

## 2018-04-07 NOTE — Telephone Encounter (Signed)
Transition Care Management Follow-up Telephone Call   Date discharged? 03/27/18   How have you been since you were released from the hospital?  "Doing pretty good".   Do you understand why you were in the hospital? Yes "pretty much"   Do you understand the discharge instructions?  Yes and no "I was in a hurry to leave". When asked what instructions she did not understandshe - stated "I don't remember".   Where were you discharged to? Home   Items Reviewed:  Medications reviewed: yes  Allergies reviewed: yes " I have a lot".  Dietary changes reviewed:  No restrictions.  Referrals reviewed: No   Functional Questionnaire:   Activities of Daily Living (ADLs):   She states they are independent in the following: all of her ADL's States they require assistance with the following: none   Any transportation issues/concerns?: no   Any patient concerns? no   Confirmed importance and date/time of follow-up visits scheduled yes Stated she did not know about her up coming on 12/16. Stated she might not be able to keep this appt. "I have so many doctors and appointments". I stressed how important to keep this appt. She stated it's on her calendar but "it doesn't mean I will come".  Provider Appointment booked with Dr Philipp Ovens .  Confirmed with patient if condition begins to worsen call PCP or go to the ER.  Patient was given the office number and encouraged to call back with question or concerns.  : yes

## 2018-04-08 DIAGNOSIS — D688 Other specified coagulation defects: Secondary | ICD-10-CM | POA: Diagnosis not present

## 2018-04-08 DIAGNOSIS — N186 End stage renal disease: Secondary | ICD-10-CM | POA: Diagnosis not present

## 2018-04-08 DIAGNOSIS — N2581 Secondary hyperparathyroidism of renal origin: Secondary | ICD-10-CM | POA: Diagnosis not present

## 2018-04-08 DIAGNOSIS — D509 Iron deficiency anemia, unspecified: Secondary | ICD-10-CM | POA: Diagnosis not present

## 2018-04-08 DIAGNOSIS — D631 Anemia in chronic kidney disease: Secondary | ICD-10-CM | POA: Diagnosis not present

## 2018-04-08 DIAGNOSIS — Z23 Encounter for immunization: Secondary | ICD-10-CM | POA: Diagnosis not present

## 2018-04-09 ENCOUNTER — Telehealth: Payer: Self-pay | Admitting: Internal Medicine

## 2018-04-09 NOTE — Telephone Encounter (Signed)
Call made to patient to follow up most recent hospitalization on 11/25-11/30.  Attempted to go over medications, but pt not "in the mood" "dont you know what Im taking"  "I dont have my bottles" .  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  I went over her bp medications, but she did not confirm nor deny what she was taking. Pt encouraged to keep HFU scheduled with her pcp on Mon 12/16 @ 1:45pm and to bring her medications.  Pt voiced frustration of having so many doctor appts back to back and stated she would "try" to make it to her appt. States she is not driving at the moment and she has to rely on other people to bring her. Pt informed that some insurance carriers provide medical transportation and she could contact her insurance to inquire about this.  Pt strongly encouraged to keep scheduled HFU appt .  Transition of care call was not completed, but no further action needed at this time.  Pt will f/u on Monday 12/16.Despina Hidden Cassady12/13/20192:36 PM

## 2018-04-09 NOTE — Telephone Encounter (Signed)
TOC HFU PER CARDIOLOGY ON 04/12/2018/[facility]

## 2018-04-10 DIAGNOSIS — N186 End stage renal disease: Secondary | ICD-10-CM | POA: Diagnosis not present

## 2018-04-10 DIAGNOSIS — D509 Iron deficiency anemia, unspecified: Secondary | ICD-10-CM | POA: Diagnosis not present

## 2018-04-10 DIAGNOSIS — D631 Anemia in chronic kidney disease: Secondary | ICD-10-CM | POA: Diagnosis not present

## 2018-04-10 DIAGNOSIS — D688 Other specified coagulation defects: Secondary | ICD-10-CM | POA: Diagnosis not present

## 2018-04-10 DIAGNOSIS — Z23 Encounter for immunization: Secondary | ICD-10-CM | POA: Diagnosis not present

## 2018-04-10 DIAGNOSIS — N2581 Secondary hyperparathyroidism of renal origin: Secondary | ICD-10-CM | POA: Diagnosis not present

## 2018-04-12 ENCOUNTER — Encounter: Payer: Medicare Other | Admitting: Internal Medicine

## 2018-04-13 DIAGNOSIS — D631 Anemia in chronic kidney disease: Secondary | ICD-10-CM | POA: Diagnosis not present

## 2018-04-13 DIAGNOSIS — N2581 Secondary hyperparathyroidism of renal origin: Secondary | ICD-10-CM | POA: Diagnosis not present

## 2018-04-13 DIAGNOSIS — Z23 Encounter for immunization: Secondary | ICD-10-CM | POA: Diagnosis not present

## 2018-04-13 DIAGNOSIS — D688 Other specified coagulation defects: Secondary | ICD-10-CM | POA: Diagnosis not present

## 2018-04-13 DIAGNOSIS — D509 Iron deficiency anemia, unspecified: Secondary | ICD-10-CM | POA: Diagnosis not present

## 2018-04-13 DIAGNOSIS — N186 End stage renal disease: Secondary | ICD-10-CM | POA: Diagnosis not present

## 2018-04-15 DIAGNOSIS — D631 Anemia in chronic kidney disease: Secondary | ICD-10-CM | POA: Diagnosis not present

## 2018-04-15 DIAGNOSIS — N2581 Secondary hyperparathyroidism of renal origin: Secondary | ICD-10-CM | POA: Diagnosis not present

## 2018-04-15 DIAGNOSIS — D688 Other specified coagulation defects: Secondary | ICD-10-CM | POA: Diagnosis not present

## 2018-04-15 DIAGNOSIS — D509 Iron deficiency anemia, unspecified: Secondary | ICD-10-CM | POA: Diagnosis not present

## 2018-04-15 DIAGNOSIS — N186 End stage renal disease: Secondary | ICD-10-CM | POA: Diagnosis not present

## 2018-04-15 DIAGNOSIS — Z23 Encounter for immunization: Secondary | ICD-10-CM | POA: Diagnosis not present

## 2018-04-17 DIAGNOSIS — N186 End stage renal disease: Secondary | ICD-10-CM | POA: Diagnosis not present

## 2018-04-17 DIAGNOSIS — D509 Iron deficiency anemia, unspecified: Secondary | ICD-10-CM | POA: Diagnosis not present

## 2018-04-17 DIAGNOSIS — D688 Other specified coagulation defects: Secondary | ICD-10-CM | POA: Diagnosis not present

## 2018-04-17 DIAGNOSIS — D631 Anemia in chronic kidney disease: Secondary | ICD-10-CM | POA: Diagnosis not present

## 2018-04-17 DIAGNOSIS — Z23 Encounter for immunization: Secondary | ICD-10-CM | POA: Diagnosis not present

## 2018-04-17 DIAGNOSIS — N2581 Secondary hyperparathyroidism of renal origin: Secondary | ICD-10-CM | POA: Diagnosis not present

## 2018-04-19 DIAGNOSIS — D509 Iron deficiency anemia, unspecified: Secondary | ICD-10-CM | POA: Diagnosis not present

## 2018-04-19 DIAGNOSIS — D688 Other specified coagulation defects: Secondary | ICD-10-CM | POA: Diagnosis not present

## 2018-04-19 DIAGNOSIS — N186 End stage renal disease: Secondary | ICD-10-CM | POA: Diagnosis not present

## 2018-04-19 DIAGNOSIS — N2581 Secondary hyperparathyroidism of renal origin: Secondary | ICD-10-CM | POA: Diagnosis not present

## 2018-04-19 DIAGNOSIS — D631 Anemia in chronic kidney disease: Secondary | ICD-10-CM | POA: Diagnosis not present

## 2018-04-19 DIAGNOSIS — Z23 Encounter for immunization: Secondary | ICD-10-CM | POA: Diagnosis not present

## 2018-04-22 DIAGNOSIS — D631 Anemia in chronic kidney disease: Secondary | ICD-10-CM | POA: Diagnosis not present

## 2018-04-22 DIAGNOSIS — D688 Other specified coagulation defects: Secondary | ICD-10-CM | POA: Diagnosis not present

## 2018-04-22 DIAGNOSIS — D509 Iron deficiency anemia, unspecified: Secondary | ICD-10-CM | POA: Diagnosis not present

## 2018-04-22 DIAGNOSIS — N2581 Secondary hyperparathyroidism of renal origin: Secondary | ICD-10-CM | POA: Diagnosis not present

## 2018-04-22 DIAGNOSIS — N186 End stage renal disease: Secondary | ICD-10-CM | POA: Diagnosis not present

## 2018-04-22 DIAGNOSIS — Z23 Encounter for immunization: Secondary | ICD-10-CM | POA: Diagnosis not present

## 2018-04-24 DIAGNOSIS — N186 End stage renal disease: Secondary | ICD-10-CM | POA: Diagnosis not present

## 2018-04-24 DIAGNOSIS — Z23 Encounter for immunization: Secondary | ICD-10-CM | POA: Diagnosis not present

## 2018-04-24 DIAGNOSIS — N2581 Secondary hyperparathyroidism of renal origin: Secondary | ICD-10-CM | POA: Diagnosis not present

## 2018-04-24 DIAGNOSIS — D631 Anemia in chronic kidney disease: Secondary | ICD-10-CM | POA: Diagnosis not present

## 2018-04-24 DIAGNOSIS — D509 Iron deficiency anemia, unspecified: Secondary | ICD-10-CM | POA: Diagnosis not present

## 2018-04-24 DIAGNOSIS — D688 Other specified coagulation defects: Secondary | ICD-10-CM | POA: Diagnosis not present

## 2018-04-27 DIAGNOSIS — I129 Hypertensive chronic kidney disease with stage 1 through stage 4 chronic kidney disease, or unspecified chronic kidney disease: Secondary | ICD-10-CM | POA: Diagnosis not present

## 2018-04-27 DIAGNOSIS — N186 End stage renal disease: Secondary | ICD-10-CM | POA: Diagnosis not present

## 2018-04-27 DIAGNOSIS — Z992 Dependence on renal dialysis: Secondary | ICD-10-CM | POA: Diagnosis not present

## 2018-04-28 DIAGNOSIS — Z992 Dependence on renal dialysis: Secondary | ICD-10-CM | POA: Diagnosis not present

## 2018-04-28 DIAGNOSIS — N186 End stage renal disease: Secondary | ICD-10-CM | POA: Diagnosis not present

## 2018-04-29 DIAGNOSIS — N186 End stage renal disease: Secondary | ICD-10-CM | POA: Diagnosis not present

## 2018-04-29 DIAGNOSIS — Z992 Dependence on renal dialysis: Secondary | ICD-10-CM | POA: Diagnosis not present

## 2018-05-01 DIAGNOSIS — Z992 Dependence on renal dialysis: Secondary | ICD-10-CM | POA: Diagnosis not present

## 2018-05-01 DIAGNOSIS — N186 End stage renal disease: Secondary | ICD-10-CM | POA: Diagnosis not present

## 2018-05-04 DIAGNOSIS — Z992 Dependence on renal dialysis: Secondary | ICD-10-CM | POA: Diagnosis not present

## 2018-05-04 DIAGNOSIS — N186 End stage renal disease: Secondary | ICD-10-CM | POA: Diagnosis not present

## 2018-05-06 DIAGNOSIS — N186 End stage renal disease: Secondary | ICD-10-CM | POA: Diagnosis not present

## 2018-05-06 DIAGNOSIS — Z992 Dependence on renal dialysis: Secondary | ICD-10-CM | POA: Diagnosis not present

## 2018-05-08 DIAGNOSIS — N186 End stage renal disease: Secondary | ICD-10-CM | POA: Diagnosis not present

## 2018-05-08 DIAGNOSIS — Z992 Dependence on renal dialysis: Secondary | ICD-10-CM | POA: Diagnosis not present

## 2018-05-11 DIAGNOSIS — Z992 Dependence on renal dialysis: Secondary | ICD-10-CM | POA: Diagnosis not present

## 2018-05-11 DIAGNOSIS — N186 End stage renal disease: Secondary | ICD-10-CM | POA: Diagnosis not present

## 2018-05-13 DIAGNOSIS — Z992 Dependence on renal dialysis: Secondary | ICD-10-CM | POA: Diagnosis not present

## 2018-05-13 DIAGNOSIS — N186 End stage renal disease: Secondary | ICD-10-CM | POA: Diagnosis not present

## 2018-05-15 DIAGNOSIS — Z992 Dependence on renal dialysis: Secondary | ICD-10-CM | POA: Diagnosis not present

## 2018-05-15 DIAGNOSIS — N186 End stage renal disease: Secondary | ICD-10-CM | POA: Diagnosis not present

## 2018-05-18 DIAGNOSIS — Z992 Dependence on renal dialysis: Secondary | ICD-10-CM | POA: Diagnosis not present

## 2018-05-18 DIAGNOSIS — N186 End stage renal disease: Secondary | ICD-10-CM | POA: Diagnosis not present

## 2018-05-20 DIAGNOSIS — Z992 Dependence on renal dialysis: Secondary | ICD-10-CM | POA: Diagnosis not present

## 2018-05-20 DIAGNOSIS — N186 End stage renal disease: Secondary | ICD-10-CM | POA: Diagnosis not present

## 2018-05-22 DIAGNOSIS — N186 End stage renal disease: Secondary | ICD-10-CM | POA: Diagnosis not present

## 2018-05-22 DIAGNOSIS — Z992 Dependence on renal dialysis: Secondary | ICD-10-CM | POA: Diagnosis not present

## 2018-05-25 DIAGNOSIS — N281 Cyst of kidney, acquired: Secondary | ICD-10-CM | POA: Diagnosis not present

## 2018-05-25 DIAGNOSIS — Z79899 Other long term (current) drug therapy: Secondary | ICD-10-CM | POA: Diagnosis not present

## 2018-05-25 DIAGNOSIS — I10 Essential (primary) hypertension: Secondary | ICD-10-CM | POA: Diagnosis not present

## 2018-05-25 DIAGNOSIS — Z7982 Long term (current) use of aspirin: Secondary | ICD-10-CM | POA: Diagnosis not present

## 2018-05-25 DIAGNOSIS — Z992 Dependence on renal dialysis: Secondary | ICD-10-CM | POA: Diagnosis not present

## 2018-05-25 DIAGNOSIS — I120 Hypertensive chronic kidney disease with stage 5 chronic kidney disease or end stage renal disease: Secondary | ICD-10-CM | POA: Diagnosis not present

## 2018-05-25 DIAGNOSIS — R079 Chest pain, unspecified: Secondary | ICD-10-CM | POA: Diagnosis not present

## 2018-05-25 DIAGNOSIS — Z5320 Procedure and treatment not carried out because of patient's decision for unspecified reasons: Secondary | ICD-10-CM | POA: Diagnosis not present

## 2018-05-25 DIAGNOSIS — J159 Unspecified bacterial pneumonia: Secondary | ICD-10-CM | POA: Diagnosis not present

## 2018-05-25 DIAGNOSIS — N186 End stage renal disease: Secondary | ICD-10-CM | POA: Diagnosis not present

## 2018-05-25 DIAGNOSIS — J181 Lobar pneumonia, unspecified organism: Secondary | ICD-10-CM | POA: Diagnosis not present

## 2018-05-25 DIAGNOSIS — J9811 Atelectasis: Secondary | ICD-10-CM | POA: Diagnosis not present

## 2018-05-25 DIAGNOSIS — I429 Cardiomyopathy, unspecified: Secondary | ICD-10-CM | POA: Diagnosis not present

## 2018-05-25 DIAGNOSIS — K5730 Diverticulosis of large intestine without perforation or abscess without bleeding: Secondary | ICD-10-CM | POA: Diagnosis not present

## 2018-05-25 DIAGNOSIS — J189 Pneumonia, unspecified organism: Secondary | ICD-10-CM | POA: Diagnosis not present

## 2018-05-25 DIAGNOSIS — R509 Fever, unspecified: Secondary | ICD-10-CM | POA: Diagnosis not present

## 2018-05-25 DIAGNOSIS — J90 Pleural effusion, not elsewhere classified: Secondary | ICD-10-CM | POA: Diagnosis not present

## 2018-05-25 DIAGNOSIS — I428 Other cardiomyopathies: Secondary | ICD-10-CM | POA: Diagnosis not present

## 2018-05-25 DIAGNOSIS — I252 Old myocardial infarction: Secondary | ICD-10-CM | POA: Diagnosis not present

## 2018-05-25 DIAGNOSIS — Z0289 Encounter for other administrative examinations: Secondary | ICD-10-CM | POA: Diagnosis not present

## 2018-05-25 DIAGNOSIS — Z8673 Personal history of transient ischemic attack (TIA), and cerebral infarction without residual deficits: Secondary | ICD-10-CM | POA: Diagnosis not present

## 2018-05-25 DIAGNOSIS — Z743 Need for continuous supervision: Secondary | ICD-10-CM | POA: Diagnosis not present

## 2018-05-25 DIAGNOSIS — D631 Anemia in chronic kidney disease: Secondary | ICD-10-CM | POA: Diagnosis not present

## 2019-11-12 IMAGING — CT CT ABD-PELV W/O CM
2 of 4 series · 12 of 36 positions shown, 15 images · non-contrast
Comparison: CT scan of November 06, 2008.

CLINICAL DATA: Nausea, unintentional weight loss.

EXAM:
CT CHEST, ABDOMEN AND PELVIS WITHOUT CONTRAST
TECHNIQUE: Multidetector CT imaging of the chest, abdomen and pelvis was
performed following the standard protocol without IV contrast.

[Series 2: cap w/o · axial · non-contrast · 0.83mm/px · z∈[-566,-32]mm · 9 of 129 slices shown, 12 images]
[im 11/129  mediastinal]
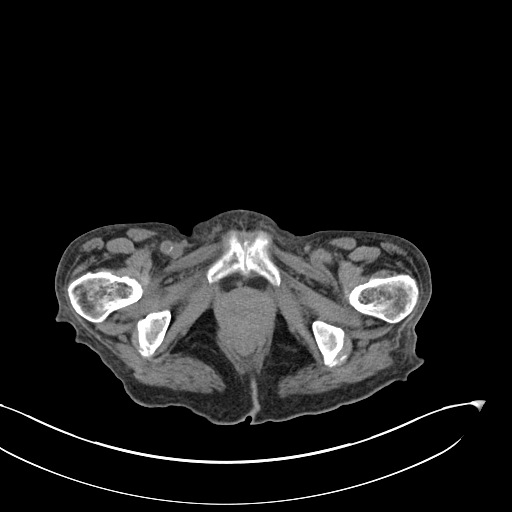
[im 11/129  lung]
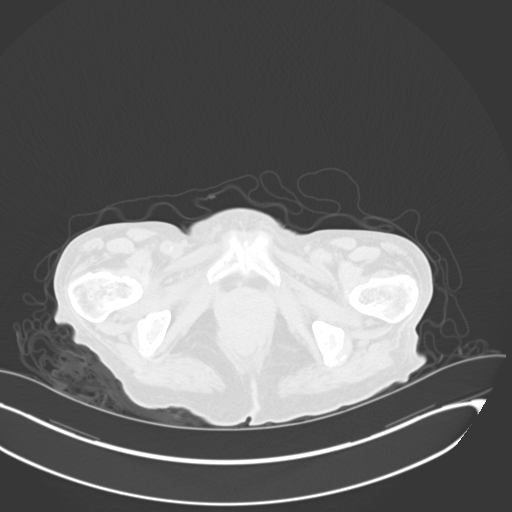
[im 22/129  lung]
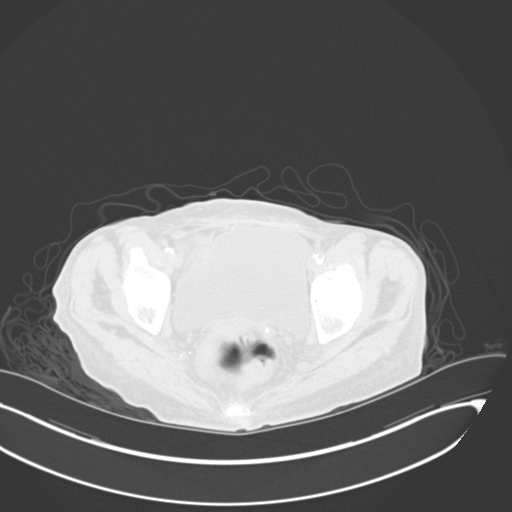
[im 43/129  lung]
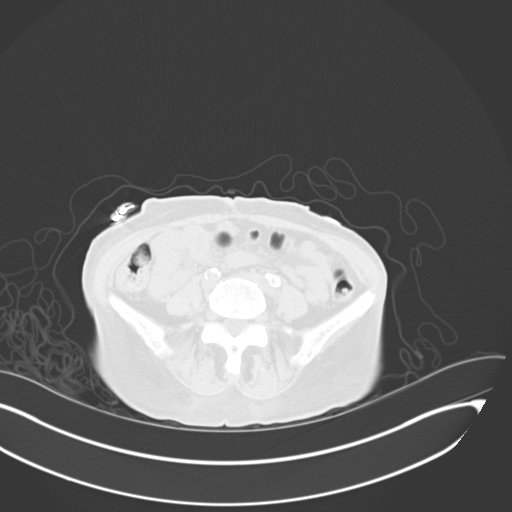
[im 54/129  lung]
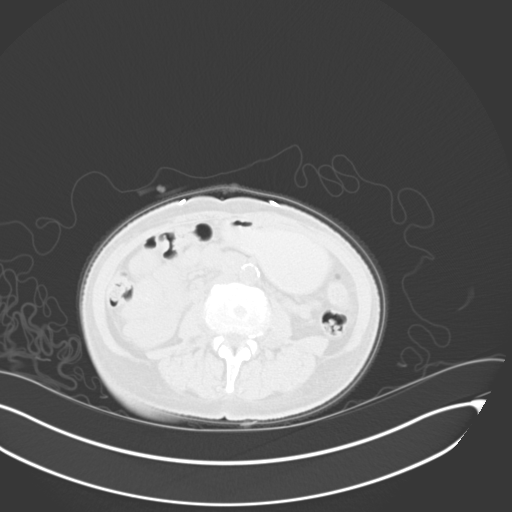
[im 65/129  mediastinal]
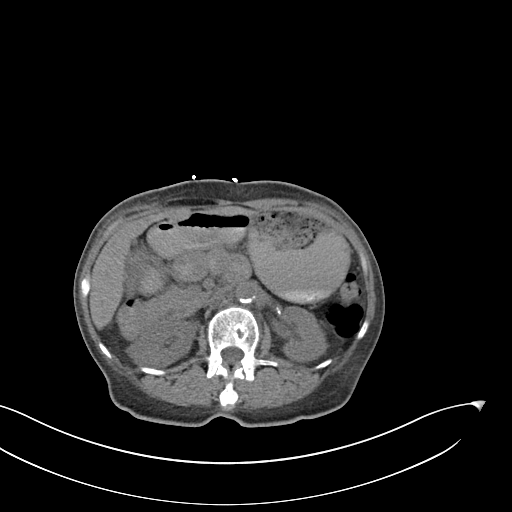
[im 65/129  lung]
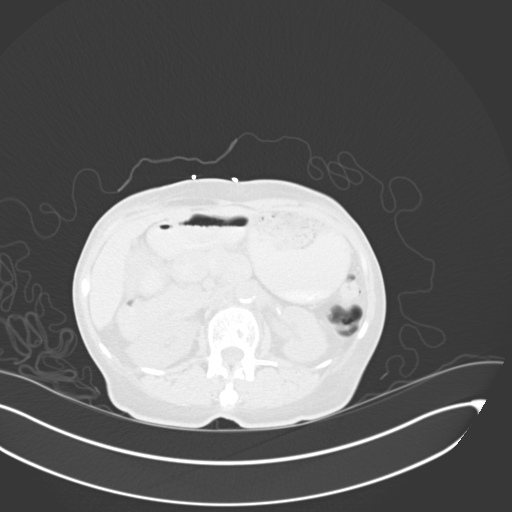
[im 75/129  lung]
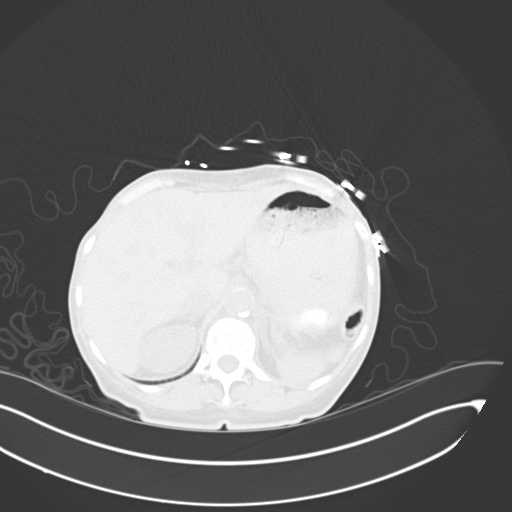
[im 86/129  lung]
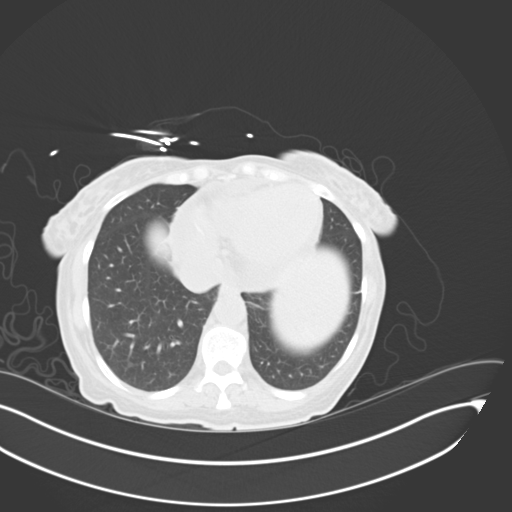
[im 107/129  lung]
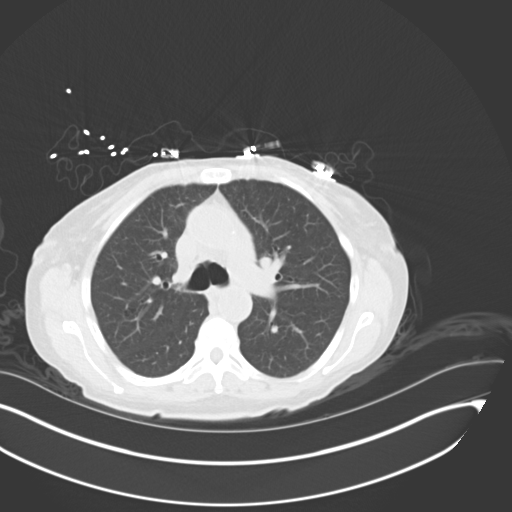
[im 118/129  mediastinal]
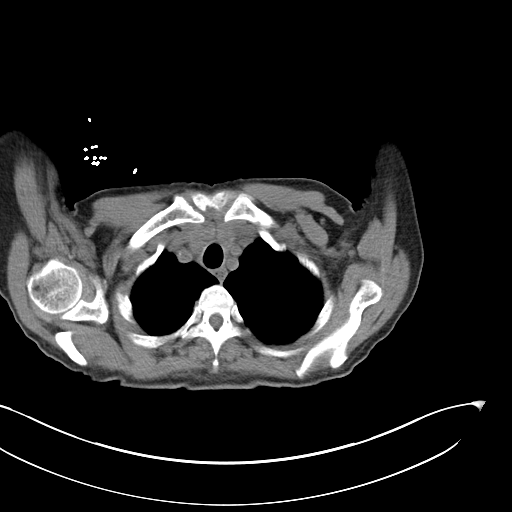
[im 118/129  lung]
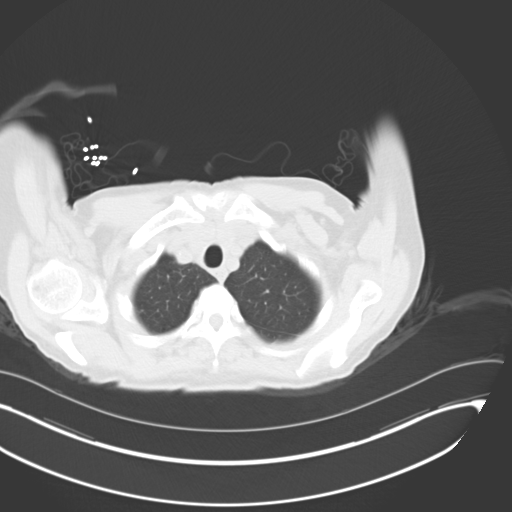

[Series 5: coronals · coronal · 0.58mm/px · 3 of 122 slices shown]
[im 25/122  lung]
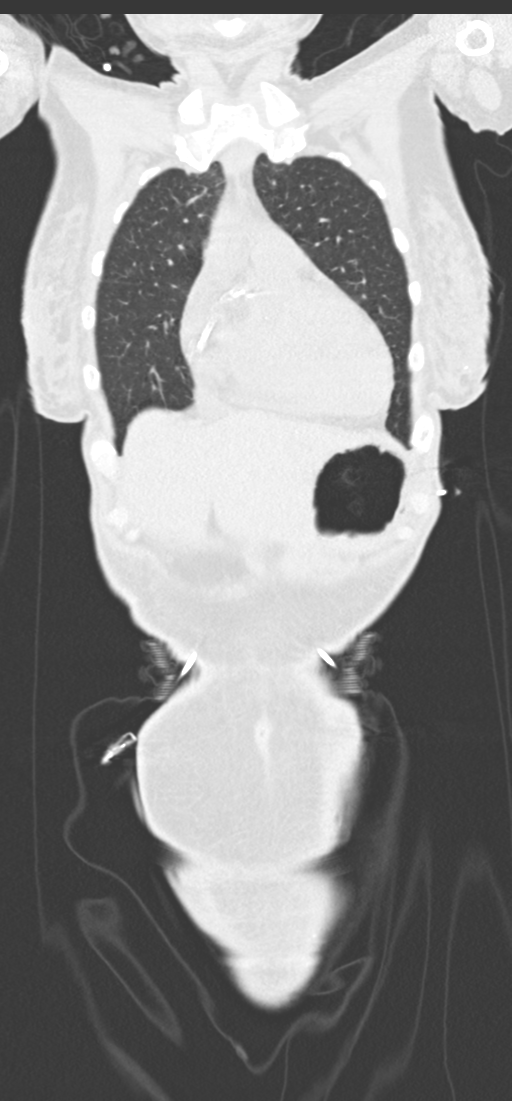
[im 49/122  lung]
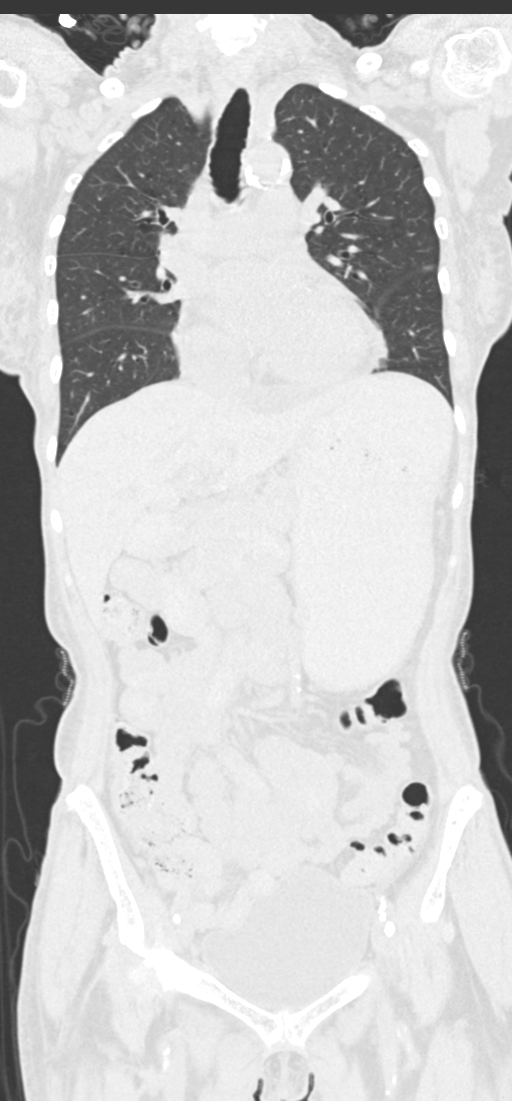
[im 73/122  lung]
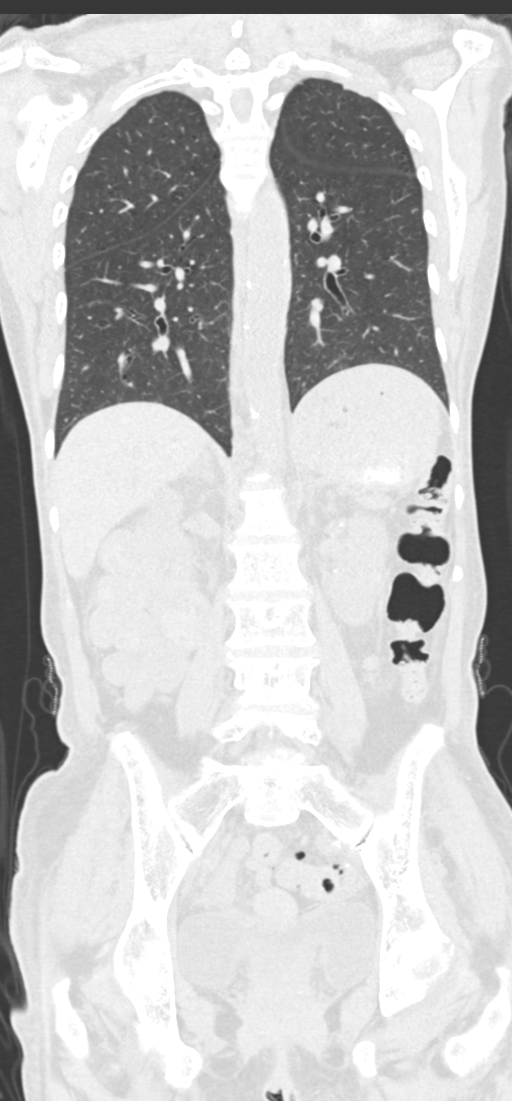

[12 of 36 positions shown; findings below may reference images not displayed]

FINDINGS: CT CHEST FINDINGS

Cardiovascular: Atherosclerosis of thoracic aorta is noted without
aneurysm formation. Coronary artery calcifications are noted.
Minimal pericardial effusion is noted. Normal cardiac size.

Mediastinum/Nodes: No enlarged mediastinal, hilar, or axillary lymph
nodes. Thyroid gland, trachea, and esophagus demonstrate no
significant findings.

Lungs/Pleura: Lungs are clear. No pleural effusion or pneumothorax.

Musculoskeletal: No chest wall mass or suspicious bone lesions
identified.

CT ABDOMEN PELVIS FINDINGS

Hepatobiliary: No focal liver abnormality is seen. Status post
cholecystectomy. No biliary dilatation.

Pancreas: Unremarkable. No pancreatic ductal dilatation or
surrounding inflammatory changes.

Spleen: Normal in size without focal abnormality.

Adrenals/Urinary Tract: Adrenal glands are unremarkable. Kidneys are
normal, without renal calculi, focal lesion, or hydronephrosis.
Bladder is unremarkable.

Stomach/Bowel: The stomach appears normal. There is no evidence of
bowel obstruction or inflammation. Sigmoid diverticulosis is noted
without inflammation. The appendix is not visualized.

Vascular/Lymphatic: Aortic atherosclerosis. No enlarged abdominal or
pelvic lymph nodes.

Reproductive: Status post hysterectomy. No adnexal masses.

Other: No abdominal wall hernia or abnormality. No abdominopelvic
ascites.

Musculoskeletal: No acute or significant osseous findings.
IMPRESSION: Coronary artery calcifications are noted suggesting coronary artery
disease.

Sigmoid diverticulosis without inflammation.

Aortic Atherosclerosis ([id number]).

## 2020-07-13 IMAGING — DX DG CHEST 1V PORT
1 series · 1 of 1 positions shown · non-contrast
Comparison: 04/11/2018

CLINICAL DATA: Acute respiratory failure. On ventilator. Follow-up
pneumothorax.

EXAM:
PORTABLE CHEST 1 VIEW

[chest]
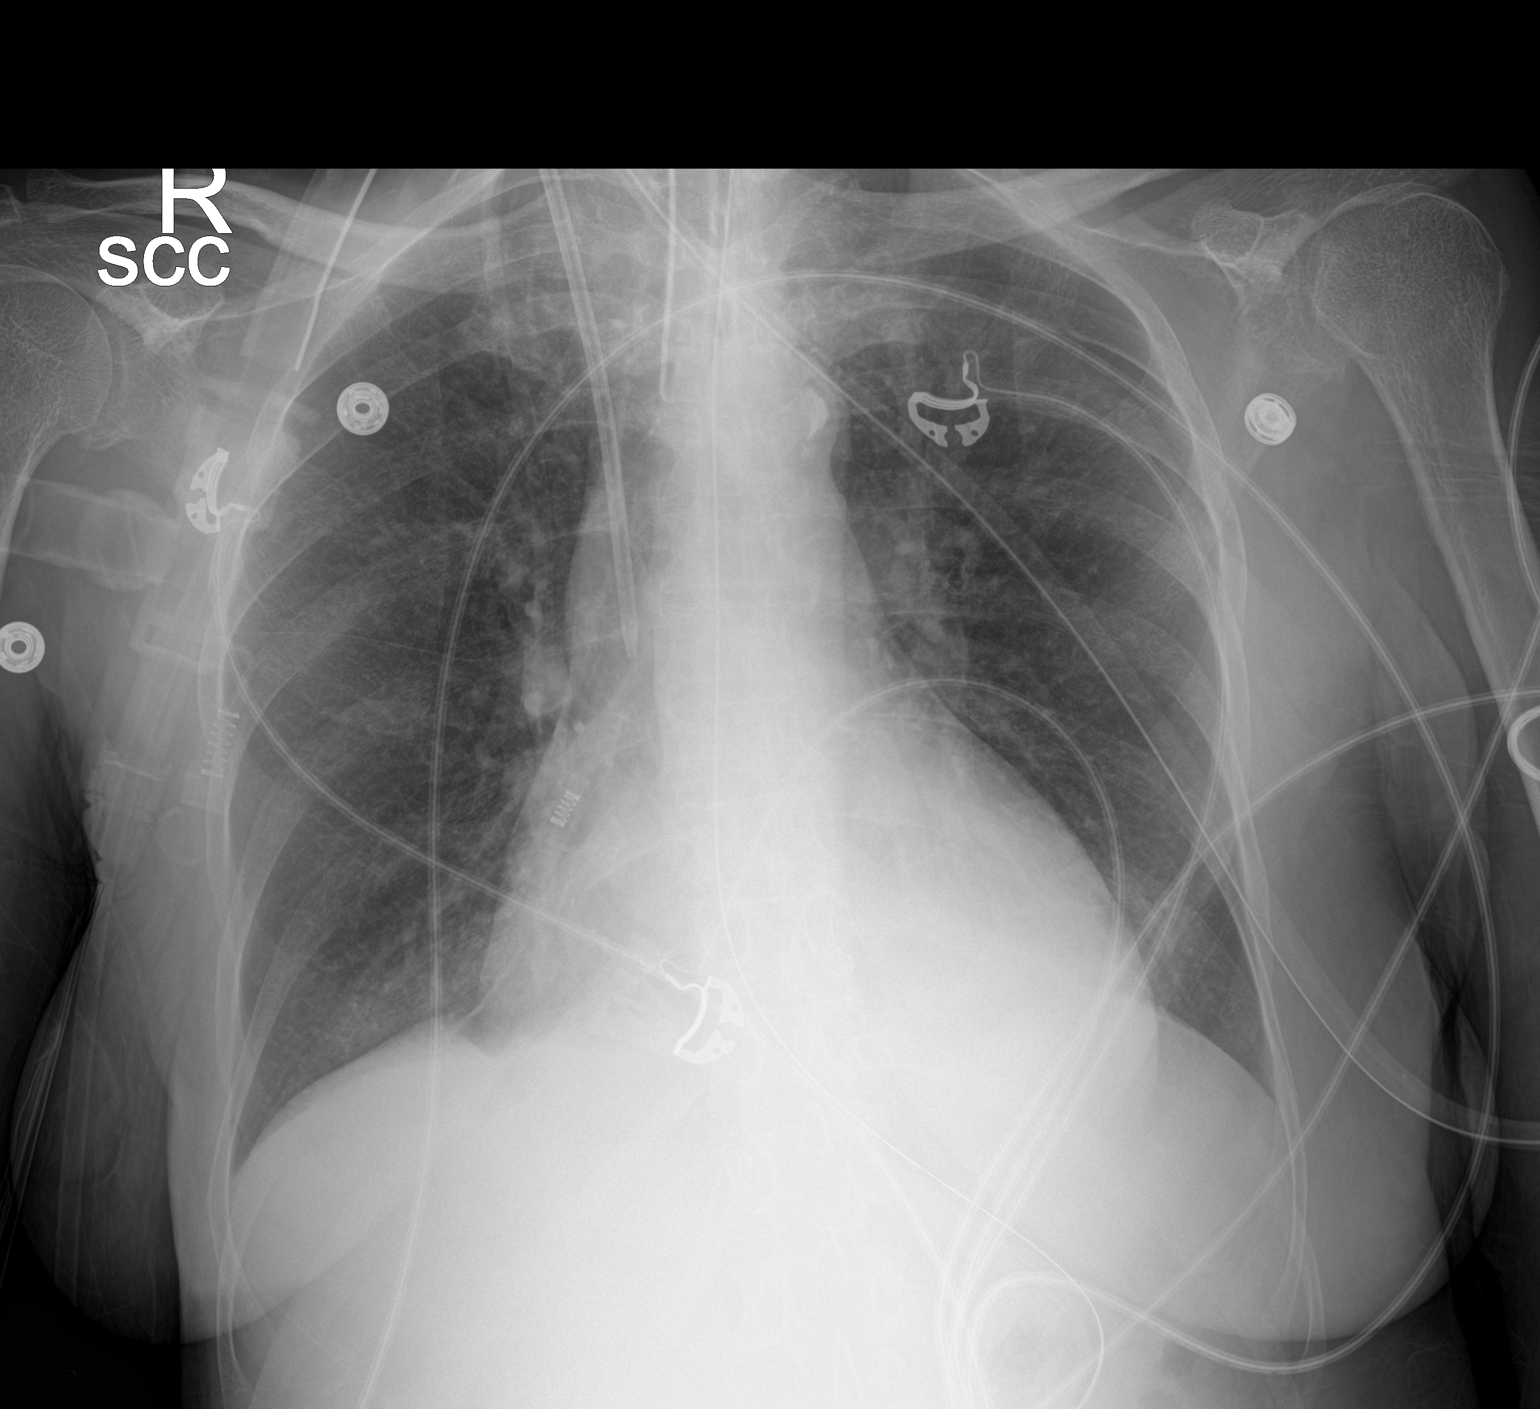

[1 of 1 positions shown; findings below may reference images not displayed]

FINDINGS: Support lines and tubes in appropriate position. No pneumothorax
visualized on current exam. Stable cardiomegaly. Pulmonary
interstitial infiltrates show mild improvement since previous study.
There has also been mild decrease in left retrocardiac atelectasis
or infiltrate.
IMPRESSION: No pneumothorax visualized.

Mild improvement in left retrocardiac atelectasis or infiltrate, and
diffuse interstitial infiltrates.
